# Patient Record
Sex: Male | Born: 1950 | Race: White | Hispanic: No | Marital: Married | Smoking: Never smoker
Health system: Southern US, Community
[De-identification: ages and names within clinical notes are randomized; demographics above are authoritative.]

## PROBLEM LIST (undated history)

## (undated) DIAGNOSIS — R112 Nausea with vomiting, unspecified: Secondary | ICD-10-CM

## (undated) DIAGNOSIS — Z8719 Personal history of other diseases of the digestive system: Secondary | ICD-10-CM

## (undated) DIAGNOSIS — C801 Malignant (primary) neoplasm, unspecified: Secondary | ICD-10-CM

## (undated) DIAGNOSIS — N200 Calculus of kidney: Secondary | ICD-10-CM

## (undated) DIAGNOSIS — N183 Chronic kidney disease, stage 3 unspecified: Secondary | ICD-10-CM

## (undated) DIAGNOSIS — E785 Hyperlipidemia, unspecified: Secondary | ICD-10-CM

## (undated) DIAGNOSIS — K219 Gastro-esophageal reflux disease without esophagitis: Secondary | ICD-10-CM

## (undated) DIAGNOSIS — I10 Essential (primary) hypertension: Secondary | ICD-10-CM

## (undated) DIAGNOSIS — Z9889 Other specified postprocedural states: Secondary | ICD-10-CM

## (undated) DIAGNOSIS — I8290 Acute embolism and thrombosis of unspecified vein: Secondary | ICD-10-CM

## (undated) DIAGNOSIS — K5730 Diverticulosis of large intestine without perforation or abscess without bleeding: Secondary | ICD-10-CM

## (undated) DIAGNOSIS — K635 Polyp of colon: Secondary | ICD-10-CM

## (undated) DIAGNOSIS — G510 Bell's palsy: Secondary | ICD-10-CM

## (undated) DIAGNOSIS — K649 Unspecified hemorrhoids: Secondary | ICD-10-CM

## (undated) DIAGNOSIS — H269 Unspecified cataract: Secondary | ICD-10-CM

## (undated) DIAGNOSIS — Z5189 Encounter for other specified aftercare: Secondary | ICD-10-CM

## (undated) DIAGNOSIS — R7303 Prediabetes: Secondary | ICD-10-CM

## (undated) DIAGNOSIS — D649 Anemia, unspecified: Secondary | ICD-10-CM

## (undated) HISTORY — DX: Anemia, unspecified: D64.9

## (undated) HISTORY — DX: Calculus of kidney: N20.0

## (undated) HISTORY — PX: POLYPECTOMY: SHX149

## (undated) HISTORY — DX: Diverticulosis of large intestine without perforation or abscess without bleeding: K57.30

## (undated) HISTORY — DX: Hyperlipidemia, unspecified: E78.5

## (undated) HISTORY — DX: Polyp of colon: K63.5

## (undated) HISTORY — DX: Prediabetes: R73.03

## (undated) HISTORY — DX: Bell's palsy: G51.0

## (undated) HISTORY — DX: Encounter for other specified aftercare: Z51.89

## (undated) HISTORY — PX: COLONOSCOPY: SHX174

## (undated) HISTORY — DX: Unspecified cataract: H26.9

---

## 1975-07-03 DIAGNOSIS — G510 Bell's palsy: Secondary | ICD-10-CM

## 1975-07-03 HISTORY — DX: Bell's palsy: G51.0

## 1983-07-03 HISTORY — PX: APPENDECTOMY: SHX54

## 1999-01-12 ENCOUNTER — Encounter (INDEPENDENT_AMBULATORY_CARE_PROVIDER_SITE_OTHER): Payer: Self-pay | Admitting: Specialist

## 1999-01-12 ENCOUNTER — Other Ambulatory Visit: Admission: RE | Admit: 1999-01-12 | Discharge: 1999-01-12 | Payer: Self-pay | Admitting: Gastroenterology

## 1999-07-03 HISTORY — PX: ESOPHAGEAL DILATION: SHX303

## 1999-07-13 ENCOUNTER — Encounter (INDEPENDENT_AMBULATORY_CARE_PROVIDER_SITE_OTHER): Payer: Self-pay | Admitting: Specialist

## 1999-07-13 ENCOUNTER — Ambulatory Visit (HOSPITAL_COMMUNITY): Admission: RE | Admit: 1999-07-13 | Discharge: 1999-07-13 | Payer: Self-pay | Admitting: Gastroenterology

## 2003-07-03 DIAGNOSIS — I8290 Acute embolism and thrombosis of unspecified vein: Secondary | ICD-10-CM

## 2003-07-03 HISTORY — DX: Acute embolism and thrombosis of unspecified vein: I82.90

## 2004-05-10 ENCOUNTER — Ambulatory Visit: Payer: Self-pay | Admitting: Internal Medicine

## 2004-05-15 ENCOUNTER — Ambulatory Visit: Payer: Self-pay | Admitting: Internal Medicine

## 2004-05-16 ENCOUNTER — Encounter: Admission: RE | Admit: 2004-05-16 | Discharge: 2004-05-16 | Payer: Self-pay | Admitting: Internal Medicine

## 2004-06-19 ENCOUNTER — Ambulatory Visit: Payer: Self-pay | Admitting: Gastroenterology

## 2004-09-11 ENCOUNTER — Ambulatory Visit: Payer: Self-pay | Admitting: Internal Medicine

## 2005-03-20 ENCOUNTER — Ambulatory Visit: Payer: Self-pay | Admitting: Internal Medicine

## 2005-04-03 ENCOUNTER — Ambulatory Visit: Payer: Self-pay | Admitting: Internal Medicine

## 2005-08-08 ENCOUNTER — Ambulatory Visit: Payer: Self-pay | Admitting: Gastroenterology

## 2005-09-03 ENCOUNTER — Ambulatory Visit: Payer: Self-pay | Admitting: Internal Medicine

## 2006-02-14 ENCOUNTER — Ambulatory Visit: Payer: Self-pay | Admitting: Gastroenterology

## 2006-03-07 ENCOUNTER — Encounter (INDEPENDENT_AMBULATORY_CARE_PROVIDER_SITE_OTHER): Payer: Self-pay | Admitting: Gastroenterology

## 2006-03-07 ENCOUNTER — Ambulatory Visit: Payer: Self-pay | Admitting: Gastroenterology

## 2006-07-24 ENCOUNTER — Ambulatory Visit: Payer: Self-pay | Admitting: Gastroenterology

## 2006-09-04 ENCOUNTER — Ambulatory Visit: Payer: Self-pay | Admitting: Internal Medicine

## 2006-09-04 LAB — CONVERTED CEMR LAB
ALT: 28 units/L (ref 0–40)
AST: 12 units/L (ref 0–37)
BUN: 12 mg/dL (ref 6–23)
Cholesterol: 205 mg/dL (ref 0–200)
Creatinine, Ser: 1.1 mg/dL (ref 0.4–1.5)
Creatinine,U: 160.4 mg/dL
Direct LDL: 126.2 mg/dL
HDL: 41.2 mg/dL (ref >39.0)
Hgb A1c MFr Bld: 5.8 % (ref 4.6–6.0)
Microalb Creat Ratio: 6.9 mg/g (ref 0.0–30.0)
Microalb, Ur: 1.1 mg/dL (ref 0.0–1.9)
PSA: 1.67 ng/mL (ref 0.10–4.00)
Potassium: 3.9 meq/L (ref 3.5–5.1)
Total CHOL/HDL Ratio: 5
Triglycerides: 178 mg/dL — ABNORMAL HIGH (ref 0–149)
VLDL: 36 mg/dL (ref 0–40)

## 2006-12-06 DIAGNOSIS — G510 Bell's palsy: Secondary | ICD-10-CM

## 2006-12-06 DIAGNOSIS — K219 Gastro-esophageal reflux disease without esophagitis: Secondary | ICD-10-CM

## 2006-12-06 DIAGNOSIS — Z860100 Personal history of colon polyps, unspecified: Secondary | ICD-10-CM | POA: Insufficient documentation

## 2006-12-06 DIAGNOSIS — G43909 Migraine, unspecified, not intractable, without status migrainosus: Secondary | ICD-10-CM | POA: Insufficient documentation

## 2006-12-06 DIAGNOSIS — K5730 Diverticulosis of large intestine without perforation or abscess without bleeding: Secondary | ICD-10-CM | POA: Insufficient documentation

## 2006-12-19 ENCOUNTER — Ambulatory Visit: Payer: Self-pay | Admitting: Internal Medicine

## 2006-12-19 DIAGNOSIS — T7841XA Arthus phenomenon, initial encounter: Secondary | ICD-10-CM | POA: Insufficient documentation

## 2006-12-31 ENCOUNTER — Encounter (INDEPENDENT_AMBULATORY_CARE_PROVIDER_SITE_OTHER): Payer: Self-pay | Admitting: *Deleted

## 2006-12-31 LAB — CONVERTED CEMR LAB
ALT: 32 units/L (ref 0–40)
AST: 14 units/L (ref 0–37)
Cholesterol: 175 mg/dL (ref 0–200)
HDL: 39.5 mg/dL (ref >39.0)
LDL Cholesterol: 103 mg/dL — ABNORMAL HIGH (ref 0–99)
Total CHOL/HDL Ratio: 4.4
Triglycerides: 163 mg/dL — ABNORMAL HIGH (ref 0–149)
VLDL: 33 mg/dL (ref 0–40)

## 2007-03-20 ENCOUNTER — Telehealth (INDEPENDENT_AMBULATORY_CARE_PROVIDER_SITE_OTHER): Payer: Self-pay | Admitting: *Deleted

## 2007-09-15 ENCOUNTER — Ambulatory Visit: Payer: Self-pay | Admitting: Internal Medicine

## 2007-09-15 DIAGNOSIS — I10 Essential (primary) hypertension: Secondary | ICD-10-CM

## 2007-09-15 DIAGNOSIS — E785 Hyperlipidemia, unspecified: Secondary | ICD-10-CM

## 2007-09-22 ENCOUNTER — Encounter (INDEPENDENT_AMBULATORY_CARE_PROVIDER_SITE_OTHER): Payer: Self-pay | Admitting: *Deleted

## 2007-09-30 ENCOUNTER — Ambulatory Visit: Payer: Self-pay | Admitting: Gastroenterology

## 2008-02-10 ENCOUNTER — Ambulatory Visit: Payer: Self-pay | Admitting: Internal Medicine

## 2008-02-16 ENCOUNTER — Encounter (INDEPENDENT_AMBULATORY_CARE_PROVIDER_SITE_OTHER): Payer: Self-pay | Admitting: *Deleted

## 2008-05-19 ENCOUNTER — Telehealth (INDEPENDENT_AMBULATORY_CARE_PROVIDER_SITE_OTHER): Payer: Self-pay | Admitting: *Deleted

## 2008-05-19 ENCOUNTER — Telehealth: Payer: Self-pay | Admitting: Gastroenterology

## 2008-05-20 ENCOUNTER — Encounter: Payer: Self-pay | Admitting: Internal Medicine

## 2008-07-09 ENCOUNTER — Ambulatory Visit: Payer: Self-pay | Admitting: Internal Medicine

## 2008-08-25 ENCOUNTER — Telehealth (INDEPENDENT_AMBULATORY_CARE_PROVIDER_SITE_OTHER): Payer: Self-pay | Admitting: *Deleted

## 2008-10-05 ENCOUNTER — Ambulatory Visit: Payer: Self-pay | Admitting: Internal Medicine

## 2008-10-05 LAB — CONVERTED CEMR LAB
ALT: 25 units/L (ref 0–53)
AST: 14 units/L (ref 0–37)
Albumin: 4.1 g/dL (ref 3.5–5.2)
Alkaline Phosphatase: 64 units/L (ref 39–117)
BUN: 14 mg/dL (ref 6–23)
Basophils Absolute: 0 10*3/uL (ref 0.0–0.1)
Basophils Relative: 0.7 % (ref 0.0–3.0)
Bilirubin, Direct: 0.1 mg/dL (ref 0.0–0.3)
CO2: 31 meq/L (ref 19–32)
Calcium: 9.1 mg/dL (ref 8.4–10.5)
Chloride: 105 meq/L (ref 96–112)
Cholesterol: 171 mg/dL (ref 0–200)
Creatinine, Ser: 1.1 mg/dL (ref 0.4–1.5)
Eosinophils Absolute: 0.2 10*3/uL (ref 0.0–0.7)
Eosinophils Relative: 3.3 % (ref 0.0–5.0)
GFR calc non Af Amer: 72.54 mL/min (ref >60)
Glucose, Bld: 114 mg/dL — ABNORMAL HIGH (ref 70–99)
HCT: 43.6 % (ref 39.0–52.0)
HDL: 38.3 mg/dL — ABNORMAL LOW (ref >39.00)
Hemoglobin: 14.9 g/dL (ref 13.0–17.0)
LDL Cholesterol: 109 mg/dL — ABNORMAL HIGH (ref 0–99)
Lymphocytes Relative: 30.6 % (ref 12.0–46.0)
Lymphs Abs: 1.4 10*3/uL (ref 0.7–4.0)
MCHC: 34.1 g/dL (ref 30.0–36.0)
MCV: 89.4 fL (ref 78.0–100.0)
Monocytes Absolute: 0.4 10*3/uL (ref 0.1–1.0)
Monocytes Relative: 9.5 % (ref 3.0–12.0)
Neutro Abs: 2.6 10*3/uL (ref 1.4–7.7)
Neutrophils Relative %: 55.9 % (ref 43.0–77.0)
PSA: 2.16 ng/mL (ref 0.10–4.00)
Platelets: 159 10*3/uL (ref 150.0–400.0)
Potassium: 3.8 meq/L (ref 3.5–5.1)
RBC: 4.88 M/uL (ref 4.22–5.81)
RDW: 13.2 % (ref 11.5–14.6)
Sodium: 145 meq/L (ref 135–145)
TSH: 2.59 microintl units/mL (ref 0.35–5.50)
Total Bilirubin: 1.1 mg/dL (ref 0.3–1.2)
Total CHOL/HDL Ratio: 4
Total Protein: 6.2 g/dL (ref 6.0–8.3)
Triglycerides: 117 mg/dL (ref 0.0–149.0)
VLDL: 23.4 mg/dL (ref 0.0–40.0)
WBC: 4.6 10*3/uL (ref 4.5–10.5)

## 2008-10-14 ENCOUNTER — Ambulatory Visit: Payer: Self-pay | Admitting: Internal Medicine

## 2008-10-14 DIAGNOSIS — R7303 Prediabetes: Secondary | ICD-10-CM | POA: Insufficient documentation

## 2008-10-14 LAB — CONVERTED CEMR LAB
Cholesterol, target level: 200 mg/dL
HDL goal, serum: 40 mg/dL
LDL Goal: 100 mg/dL

## 2008-10-17 LAB — CONVERTED CEMR LAB
Creatinine,U: 71.4 mg/dL
Hgb A1c MFr Bld: 5.8 % (ref 4.6–6.5)
Microalb Creat Ratio: 5.6 mg/g (ref 0.0–30.0)
Microalb, Ur: 0.4 mg/dL (ref 0.0–1.9)

## 2008-10-18 ENCOUNTER — Encounter (INDEPENDENT_AMBULATORY_CARE_PROVIDER_SITE_OTHER): Payer: Self-pay | Admitting: *Deleted

## 2009-03-31 ENCOUNTER — Ambulatory Visit: Payer: Self-pay | Admitting: Internal Medicine

## 2009-04-03 LAB — CONVERTED CEMR LAB
ALT: 23 units/L (ref 0–53)
AST: 14 units/L (ref 0–37)
Albumin: 4 g/dL (ref 3.5–5.2)
Alkaline Phosphatase: 65 units/L (ref 39–117)
Bilirubin, Direct: 0 mg/dL (ref 0.0–0.3)
Cholesterol: 153 mg/dL (ref 0–200)
HDL: 39.1 mg/dL (ref >39.00)
LDL Cholesterol: 102 mg/dL — ABNORMAL HIGH (ref 0–99)
Total Bilirubin: 0.6 mg/dL (ref 0.3–1.2)
Total CHOL/HDL Ratio: 4
Total Protein: 6.1 g/dL (ref 6.0–8.3)
Triglycerides: 61 mg/dL (ref 0.0–149.0)
VLDL: 12.2 mg/dL (ref 0.0–40.0)

## 2009-04-04 ENCOUNTER — Encounter (INDEPENDENT_AMBULATORY_CARE_PROVIDER_SITE_OTHER): Payer: Self-pay | Admitting: *Deleted

## 2009-04-07 ENCOUNTER — Ambulatory Visit: Payer: Self-pay | Admitting: Internal Medicine

## 2009-04-29 ENCOUNTER — Telehealth (INDEPENDENT_AMBULATORY_CARE_PROVIDER_SITE_OTHER): Payer: Self-pay | Admitting: *Deleted

## 2009-11-29 ENCOUNTER — Ambulatory Visit: Payer: Self-pay | Admitting: Internal Medicine

## 2009-11-29 DIAGNOSIS — M2550 Pain in unspecified joint: Secondary | ICD-10-CM | POA: Insufficient documentation

## 2010-03-31 ENCOUNTER — Telehealth (INDEPENDENT_AMBULATORY_CARE_PROVIDER_SITE_OTHER): Payer: Self-pay | Admitting: *Deleted

## 2010-03-31 ENCOUNTER — Ambulatory Visit: Payer: Self-pay | Admitting: Internal Medicine

## 2010-04-03 LAB — CONVERTED CEMR LAB
ALT: 25 units/L (ref 0–53)
AST: 15 units/L (ref 0–37)
Albumin: 4 g/dL (ref 3.5–5.2)
Alkaline Phosphatase: 68 units/L (ref 39–117)
BUN: 18 mg/dL (ref 6–23)
Basophils Absolute: 0 10*3/uL (ref 0.0–0.1)
Basophils Relative: 0.8 % (ref 0.0–3.0)
Bilirubin, Direct: 0.2 mg/dL (ref 0.0–0.3)
CO2: 31 meq/L (ref 19–32)
Calcium: 8.7 mg/dL (ref 8.4–10.5)
Chloride: 104 meq/L (ref 96–112)
Cholesterol: 176 mg/dL (ref 0–200)
Creatinine, Ser: 1.2 mg/dL (ref 0.4–1.5)
Eosinophils Absolute: 0.2 10*3/uL (ref 0.0–0.7)
Eosinophils Relative: 5.2 % — ABNORMAL HIGH (ref 0.0–5.0)
GFR calc non Af Amer: 66.56 mL/min (ref >60)
Glucose, Bld: 112 mg/dL — ABNORMAL HIGH (ref 70–99)
HCT: 41.6 % (ref 39.0–52.0)
HDL: 41.5 mg/dL (ref >39.00)
Hemoglobin: 14.4 g/dL (ref 13.0–17.0)
LDL Cholesterol: 119 mg/dL — ABNORMAL HIGH (ref 0–99)
Lymphocytes Relative: 32.8 % (ref 12.0–46.0)
Lymphs Abs: 1.5 10*3/uL (ref 0.7–4.0)
MCHC: 34.5 g/dL (ref 30.0–36.0)
MCV: 91.1 fL (ref 78.0–100.0)
Monocytes Absolute: 0.5 10*3/uL (ref 0.1–1.0)
Monocytes Relative: 9.8 % (ref 3.0–12.0)
Neutro Abs: 2.4 10*3/uL (ref 1.4–7.7)
Neutrophils Relative %: 51.4 % (ref 43.0–77.0)
PSA: 2.63 ng/mL (ref 0.10–4.00)
Platelets: 161 10*3/uL (ref 150.0–400.0)
Potassium: 2.6 meq/L — CL (ref 3.5–5.1)
RBC: 4.57 M/uL (ref 4.22–5.81)
RDW: 13.8 % (ref 11.5–14.6)
Sodium: 142 meq/L (ref 135–145)
TSH: 1.82 microintl units/mL (ref 0.35–5.50)
Total Bilirubin: 1.1 mg/dL (ref 0.3–1.2)
Total CHOL/HDL Ratio: 4
Total Protein: 6 g/dL (ref 6.0–8.3)
Triglycerides: 80 mg/dL (ref 0.0–149.0)
VLDL: 16 mg/dL (ref 0.0–40.0)
WBC: 4.6 10*3/uL (ref 4.5–10.5)

## 2010-04-04 ENCOUNTER — Telehealth (INDEPENDENT_AMBULATORY_CARE_PROVIDER_SITE_OTHER): Payer: Self-pay | Admitting: *Deleted

## 2010-04-04 LAB — CONVERTED CEMR LAB: Hgb A1c MFr Bld: 5.7 % (ref 4.6–6.5)

## 2010-04-05 ENCOUNTER — Ambulatory Visit: Payer: Self-pay | Admitting: Internal Medicine

## 2010-04-05 DIAGNOSIS — E876 Hypokalemia: Secondary | ICD-10-CM | POA: Insufficient documentation

## 2010-04-06 LAB — CONVERTED CEMR LAB
BUN: 16 mg/dL (ref 6–23)
CO2: 33 meq/L — ABNORMAL HIGH (ref 19–32)
Calcium: 9.1 mg/dL (ref 8.4–10.5)
Chloride: 104 meq/L (ref 96–112)
Creatinine, Ser: 1.1 mg/dL (ref 0.4–1.5)
GFR calc non Af Amer: 71.43 mL/min (ref >60)
Glucose, Bld: 102 mg/dL — ABNORMAL HIGH (ref 70–99)
Potassium: 3.2 meq/L — ABNORMAL LOW (ref 3.5–5.1)
Sodium: 145 meq/L (ref 135–145)

## 2010-04-18 ENCOUNTER — Ambulatory Visit: Payer: Self-pay | Admitting: Internal Medicine

## 2010-05-16 ENCOUNTER — Ambulatory Visit: Payer: Self-pay | Admitting: Internal Medicine

## 2010-05-16 ENCOUNTER — Telehealth (INDEPENDENT_AMBULATORY_CARE_PROVIDER_SITE_OTHER): Payer: Self-pay | Admitting: *Deleted

## 2010-05-22 LAB — CONVERTED CEMR LAB
BUN: 23 mg/dL (ref 6–23)
Cortisol, Plasma: 10.8 ug/dL
Creatinine, Ser: 1.4 mg/dL (ref 0.4–1.5)
Potassium: 4.6 meq/L (ref 3.5–5.1)

## 2010-07-30 LAB — CONVERTED CEMR LAB
ALT: 40 units/L (ref 0–53)
AST: 13 units/L (ref 0–37)
Albumin: 4.1 g/dL (ref 3.5–5.2)
Alkaline Phosphatase: 84 units/L (ref 39–117)
BUN: 14 mg/dL (ref 6–23)
Basophils Absolute: 0 10*3/uL (ref 0.0–0.1)
Basophils Relative: 0.7 % (ref 0.0–1.0)
Bilirubin, Direct: 0.1 mg/dL (ref 0.0–0.3)
CO2: 35 meq/L — ABNORMAL HIGH (ref 19–32)
Calcium: 9.3 mg/dL (ref 8.4–10.5)
Chloride: 105 meq/L (ref 96–112)
Cholesterol, target level: 200 mg/dL
Cholesterol: 214 mg/dL (ref 0–200)
Creatinine, Ser: 1.1 mg/dL (ref 0.4–1.5)
Direct LDL: 125.3 mg/dL
Eosinophils Absolute: 0.2 10*3/uL (ref 0.0–0.6)
Eosinophils Relative: 3.7 % (ref 0.0–5.0)
GFR calc Af Amer: 88 mL/min
GFR calc non Af Amer: 73 mL/min
Glucose, Bld: 106 mg/dL — ABNORMAL HIGH (ref 70–99)
HCT: 46.9 % (ref 39.0–52.0)
HDL goal, serum: 40 mg/dL
HDL: 38.7 mg/dL — ABNORMAL LOW (ref >39.0)
Hemoglobin: 15.6 g/dL (ref 13.0–17.0)
Hgb A1c MFr Bld: 5.9 % (ref 4.6–6.0)
Hgb A1c MFr Bld: 6 % (ref 4.6–6.0)
LDL Goal: 130 mg/dL
Lymphocytes Relative: 28.5 % (ref 12.0–46.0)
MCHC: 33.2 g/dL (ref 30.0–36.0)
MCV: 89.2 fL (ref 78.0–100.0)
Monocytes Absolute: 0.6 10*3/uL (ref 0.2–0.7)
Monocytes Relative: 9.8 % (ref 3.0–11.0)
Neutro Abs: 3.5 10*3/uL (ref 1.4–7.7)
Neutrophils Relative %: 57.3 % (ref 43.0–77.0)
PSA: 1.99 ng/mL (ref 0.10–4.00)
Platelets: 189 10*3/uL (ref 150–400)
Potassium: 3.8 meq/L (ref 3.5–5.1)
RBC: 5.26 M/uL (ref 4.22–5.81)
RDW: 12.7 % (ref 11.5–14.6)
Sodium: 145 meq/L (ref 135–145)
TSH: 2.1 microintl units/mL (ref 0.35–5.50)
Total Bilirubin: 0.8 mg/dL (ref 0.3–1.2)
Total CHOL/HDL Ratio: 5.5
Total Protein: 6.5 g/dL (ref 6.0–8.3)
Triglycerides: 236 mg/dL (ref 0–149)
VLDL: 47 mg/dL — ABNORMAL HIGH (ref 0–40)
WBC: 6 10*3/uL (ref 4.5–10.5)

## 2010-08-03 NOTE — Progress Notes (Signed)
Summary: RX  Phone Note Refill Request Call back at Home Phone [phone] Message from:  Patient on May 16, 2010 7:48 AM  Refills Requested: Medication #1:  ACIPHEX 20 MG TBEC 1 by mouth once daily   Dosage confirmed as above?Dosage Confirmed   Supply Requested: 1 month  Medication #2:  BENAZEPRIL HCL 40 MG  TABS (BENAZEPRIL HCL) 1 qd   Dosage confirmed as above?Dosage Confirmed   Supply Requested: 3 months  Medication #3:  CARDIZEM LA 240 MG  TB24 1 once daily   Dosage confirmed as above?Dosage Confirmed   Supply Requested: 3 months [facility] [id number]  Initial call taken by: Freddy Jaksch,  May 16, 2010 7:48 AM    Prescriptions: BENAZEPRIL HCL 40 MG  TABS (BENAZEPRIL HCL) 1 qd  #90 x 1   Entered by:   Shonna Chock CMA   Authorized by:   Marga Melnick MD   Signed by:   Shonna Chock CMA on 05/16/2010   Method used:   Faxed to ...       [facility]  [address] # [id number]* (retail)       [address]       [place], [place]  [address]       Ph: [phone] or [phone]       Fax: [phone]   RxID:   [id number] CARDIZEM LA 240 MG  TB24 (DILTIAZEM HCL COATED BEADS) 1 once daily  #90 x 1   Entered by:   Shonna Chock CMA   Authorized by:   Marga Melnick MD   Signed by:   Shonna Chock CMA on 05/16/2010   Method used:   Electronically to        [facility] # [id number]* (retail)       [address]       [place], [place]  [address]       Ph: [phone] or [phone]       Fax: [phone]   RxID:   [id number] ACIPHEX 20 MG TBEC (RABEPRAZOLE SODIUM) 1 by mouth once daily  #90 x 1   Entered by:   Shonna Chock CMA   Authorized by:   Marga Melnick MD   Signed by:   Shonna Chock CMA on 05/16/2010   Method used:   Electronically to        [facility] # [id number]* (retail)       [address]       [place], [place]  [address]       Ph: [phone] or [phone]  Fax: [phone]   RxID:   [id number]

## 2010-08-03 NOTE — Assessment & Plan Note (Signed)
Summary: rx for aciphex//lch   Vital Signs:  Patient profile:   60 year old male Weight:      223 pounds Temp:     98.2 degrees F oral Pulse rate:   64 / minute BP sitting:   138 / 86  (left arm)  Vitals Entered By: Jeremy Johann CMA (Nov 29, 2009 2:29 PM) CC: refill med,labs Comments REVIEWED MED LIST, PATIENT AGREED DOSE AND INSTRUCTION CORRECT    CC:  refill med and labs.  History of Present Illness: He needs refill for Aciphex; PMH of Esophgeal Stricture, S/P dilation. No dyshagia  or other GI symptoms. No triggers for ERD except rare beer & 2-3 cups of coffee/ day & 3 glasses of tea.  Allergies: 1)  ! Sulfa 2)  ! Pcn  Review of Systems General:  Denies fatigue and weight loss. ENT:  Denies difficulty swallowing and hoarseness. GI:  Denies abdominal pain, bloody stools, dark tarry stools, indigestion, loss of appetite, and nausea. MS:  Complains of joint pain and joint swelling; denies joint redness; Occasional knee pain , R > L.  Physical Exam  General:  well-nourished; alert,appropriate and cooperative throughout examination Eyes:  No corneal or conjunctival inflammation noted.  Perrla.No icterus Mouth:  Oral mucosa and oropharynx without lesions or exudates.  Teeth in good repair. No pharyngeal erythema.   Lungs:  Normal respiratory effort, chest expands symmetrically. Lungs are clear to auscultation, no crackles or wheezes. Heart:  regular rhythm, no murmur, no gallop, no rub, no JVD, no HJR, and bradycardia.   Abdomen:  Bowel sounds positive,abdomen soft and non-tender without masses, organomegaly or hernias noted. Pulses:  R and L carotid,radial,dorsalis pedis and posterior tibial pulses are full and equal bilaterally Extremities:  No clubbing, cyanosis, edema, or deformity noted with normal full range of motion of all joints.  Mild crepitus L knee > R  Neurologic:  alert & oriented X3.   Skin:  Intact without suspicious lesions or rashes. No  jaundice   Impression & Recommendations:  Problem # 1:  GERD (ICD-530.81) PMH of esophageal stricture His updated medication list for this problem includes:    Aciphex 20 Mg Tbec (Rabeprazole sodium) .Marland Kitchen... 1 by mouth once daily  Problem # 2:  ARTHRALGIA (ICD-719.40) Knees  Complete Medication List: 1)  Aciphex 20 Mg Tbec (Rabeprazole sodium) .Marland Kitchen.. 1 by mouth once daily 2)  Cardizem La 240 Mg Tb24 (Diltiazem hcl coated beads) .Marland Kitchen.. 1 once daily 3)  Benazepril Hcl 40 Mg Tabs (benazepril Hcl)  .Marland Kitchen.. 1 qd 4)  Multivitamins Tabs (Multiple vitamin) .Marland Kitchen.. 1 by mouth once daily 5)  Fish Oil Concentrate 1000 Mg Caps (Omega-3 fatty acids) .Marland Kitchen.. 1 by mouth two times a day 6)  Tramadol Hcl 50 Mg Tabs (Tramadol hcl) .Marland Kitchen.. 1 every 6 hrs as needed knee pain  Patient Instructions: 1)  Please schedule a follow-up appointment in 5  months. 2)  Avoid foods high in acid (tomatoes, citrus juices, spicy foods). Avoid eating within two hours of lying down or before exercising. Do not over eat; try smaller more frequent meals. Elevate head of bed twelve inches when sleeping. 3)  BMP prior to visit, ICD-9:401.9 4)  Hepatic Panel prior to visit, ICD-9:995.20 5)  Lipid Panel prior to visit, ICD-9:272.4 6)  TSH prior to visit, ICD-9:272.4 7)  CBC w/ Diff prior to visit, ICD-9:530.81 8)  PSA prior to visit, ICD-9:600.9 Prescriptions: TRAMADOL HCL 50 MG TABS (TRAMADOL HCL) 1 every 6 hrs as needed knee pain  #  30 x 5   Entered and Authorized by:   Marga Melnick MD   Signed by:   Marga Melnick MD on 11/29/2009   Method used:   Print then Give to Patient   RxID:   [id number] ACIPHEX 20 MG TBEC (RABEPRAZOLE SODIUM) 1 by mouth once daily  #90 x 1   Entered and Authorized by:   Marga Melnick MD   Signed by:   Marga Melnick MD on 11/29/2009   Method used:   Print then Give to Patient   RxID:   [id number]

## 2010-08-03 NOTE — Progress Notes (Signed)
Summary: lab order from ov [id number] - ov [id number]  ---- Converted from flag ---- ---- 04/04/2010 10:04 AM, Okey Regal [place] wrote: labs order is follow up  from [facility] [id number] - he has an appt scheduled [id number]  ---- 04/03/2010 7:43 AM, Marga Melnick MD wrote: please verify F/U appt; these are CPX labs ------------------------------

## 2010-08-03 NOTE — Assessment & Plan Note (Signed)
Summary: RESCHED FROM 10/6//PH   Vital Signs:  Patient profile:   60 year old male Weight:      223.2 pounds Pulse rate:   56 / minute Resp:     16 per minute BP sitting:   158 / 92  (left arm) Cuff size:   large  Vitals Entered By: Shonna Chock CMA (April 18, 2010 9:05 AM) CC: Follow-up visit: discuss labs (copy given)   CC:  Follow-up visit: discuss labs (copy given).  History of Present Illness:   Hypokalemia ( 2.6 ) on 03/31/2010; it was 3.2 after 10 meQ two times a day. He he denies use of laxatives, steroids  or diuretics. No recent gastroenteritis with N&V or diarrhea..  The patient reports headaches, but denies lightheadedness, urinary frequency, and fatigue.  The patient denies the following associated symptoms: chest pain, chest pressure, exercise intolerance, dyspnea, palpitations, syncope, leg edema, and pedal edema.  Compliance with medications (by patient report) has been near 100%.  The patient reports that dietary compliance has been good.  The patient reports exercising 3-4X per week.  Adjunctive measures currently used by the patient include salt restriction.  BP @ home 140-150s/ 80-90s.  Current Medications (verified): 1)  Aciphex 20 Mg Tbec (Rabeprazole Sodium) .Marland Kitchen.. 1 By Mouth Once Daily 2)  Cardizem La 240 Mg  Tb24 (Diltiazem Hcl Coated Beads) .Marland Kitchen.. 1 Once Daily 3)  Benazepril Hcl 40 Mg  Tabs (Benazepril Hcl) .Marland Kitchen.. 1 Qd 4)  Multivitamins  Tabs (Multiple Vitamin) .Marland Kitchen.. 1 By Mouth Once Daily 5)  Fish Oil Concentrate 1000 Mg Caps (Omega-3 Fatty Acids) .Marland Kitchen.. 1 By Mouth Two Times A Day 6)  Kcl .... 1 By Mouth Two Times A Day  Allergies: 1)  ! Sulfa 2)  ! 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 Neuro:  Denies brief paralysis, numbness, tingling, and weakness; "aggravating " L frontal headache intermittently , relieved by Excedrin.  Physical Exam  General:  well-nourished,in no acute distress; alert,appropriate and cooperative throughout examination Eyes:  No corneal or conjunctival inflammation noted. EOMI. Perrla. Lungs:  Normal respiratory effort, chest expands symmetrically. Lungs are clear to auscultation, no crackles or wheezes. Heart:  Normal rate and regular rhythm. S1 and S2 normal without gallop, murmur, click, rub or other extra sounds. Abdomen:  Bowel sounds positive,abdomen soft and non-tender without masses, organomegaly or hernias noted. No AAA or renal bruits Pulses:  R and L carotid,radial,dorsalis pedis and posterior tibial pulses are full and equal bilaterally Neurologic:   Very subtle facial asymmetry;alert & oriented X3 ; DTRs symmetrical and normal.   Skin:  Intact without suspicious lesions or rashes Cervical Nodes:  No lymphadenopathy noted Axillary Nodes:  No palpable lymphadenopathy Psych:  memory intact for recent and remote, normally interactive, and good eye contact.     Impression & Recommendations:  Problem # 1:  HYPOKALEMIA (ICD-276.8)  Problem # 2:  HYPERTENSION, ESSENTIAL NOS (ICD-401.9)  His updated medication list for this problem includes:    Cardizem La 240 Mg Tb24 (Diltiazem hcl coated beads) .Marland Kitchen... 1 once daily    Spironolactone 25 Mg Tabs (Spironolactone) .Marland Kitchen... 1 once daily  Complete Medication List: 1)  Aciphex 20 Mg Tbec (Rabeprazole sodium) .Marland Kitchen.. 1 by mouth once daily 2)  Cardizem La 240 Mg Tb24 (Diltiazem hcl coated beads) .Marland Kitchen.. 1 once daily 3)  Benazepril Hcl 40 Mg Tabs (benazepril Hcl)  .Marland Kitchen.. 1 qd 4)  Multivitamins Tabs (Multiple vitamin) .Marland Kitchen.. 1 by mouth once daily 5)  Fish Oil Concentrate 1000 Mg Caps  (Omega-3 fatty acids) .Marland Kitchen.. 1 by mouth two times a day 6)  Kcl  .... 1 by mouth two times a day 7)  Spironolactone 25 Mg Tabs (Spironolactone) .Marland Kitchen.. 1 once daily  Patient Instructions: 1)  Please schedule a follow-up LAB  appointment in 1 month for fasting Cortisol, BUN, creat, K+ ( 401.9,276.8). 2)  Check your Blood Pressure regularly. Your goal = AVERAGE < 135/85. Use "No Salt" @ the table to season food. Prescriptions: SPIRONOLACTONE 25 MG TABS (SPIRONOLACTONE) 1 once daily  #30 x 5   Entered and Authorized by:   Marga Melnick MD   Signed by:   Marga Melnick MD on 04/18/2010   Method used:   Print then Give to Patient   RxID:   [id number]    Orders Added: 1)  Est. Patient Level IV [[address]846]

## 2010-08-03 NOTE — Progress Notes (Signed)
Summary: Lab concenrs  Phone Note Outgoing Call Call back at Home Phone [phone]   Call placed by: Shonna Chock CMA,  March 31, 2010 4:44 PM Call placed to: Patient Summary of Call: Left message on machine for patient to return call when avaliable, Reason for call:   Potassium low on recent labs 2.6 , per Dr.Hopper have patient take KCL 1 by mouth two times a day and recheck labs in 4 days (next Tuesday), also no diuretics or laxatives.  Shonna Chock CMA  March 31, 2010 4:46 PM   Follow-up for Phone Call        I called patient on work number and discussed with him his potassium was  low and gave Dr.Hopper's instruction. Patient ok'd all instruction and requested rx to be called into [facility]. Patient indicated he will call Monday to set up appointment to recheck potassium level (276.8 Potassium)for 4 days after starting prescribed supplement    Follow-up by: Shonna Chock CMA,  March 31, 2010 4:51 PM    New/Updated Medications: * KCL 1 by mouth two times a day Prescriptions: KCL 1 by mouth two times a day  ##20 x 0   Entered by:   Shonna Chock CMA   Authorized by:   Marga Melnick MD   Signed by:   Shonna Chock CMA on 03/31/2010   Method used:   Telephoned to ...       [facility]  [address] # [id number]* (retail)       [address]       [place], [place]  [address]       Ph: [phone] or [phone]       Fax: [phone]   RxID:   [id number]

## 2010-10-25 ENCOUNTER — Other Ambulatory Visit: Payer: Self-pay | Admitting: Dermatology

## 2010-11-14 NOTE — Assessment & Plan Note (Signed)
[facility]                         GASTROENTEROLOGY OFFICE NOTE   Crawford, Marcus                       MRN:          [id number]  DATE:09/30/2007                            DOB:          May 15, 1951    This is a return office visit for GERD.  Marcus Crawford was previously  followed by Dr. Victorino Dike.  He has a history of GERD that has been  well controlled on Aciphex.  He has no dysphagia, odynophagia, nausea,  vomiting, or weight loss.  He has a history of adenomatous colon polyps,  and last underwent colonoscopy in September 2007, which revealed left  colon diverticulosis, internal and external hemorrhoids, and a small  hyperplastic polyp.  He has no colorectal complaints, and denies any  change in bowel habits, change in stool caliber, melena, or  hematochezia.   CURRENT MEDICATIONS:  Listed on the chart, updated and reviewed.   MEDICATION ALLERGIES:  1. SULFA.  2. PENICILLIN.   EXAMINATION:  Well-developed, overweight, white male in no acute  distress.  Weight 242 pounds.  Blood pressure 142/90.  Pulse 64 and regular.  HEENT EXAM:  Anicteric sclerae.  Oropharynx clear.  CHEST:  Clear to auscultation bilaterally.  CARDIAC:  Regular rate and rhythm without murmurs.  ABDOMEN:  Soft, nontender, non-distended, normoactive bowel sounds.  No  palpable organomegaly, masses, or hernias.   ASSESSMENT AND PLAN:  1. Gastroesophageal reflux disease.  Maintain longterm antireflux      measures.  Renew Aciphex 20 mg p.o. q.a.m.  Endoscopy in July 2000      showed a small hiatal hernia, mild gastritis, and duodenitis.      There was no evidence of erosive esophagitis or Barrett's      esophagus.  Return office visit with me in 1 year.  Otherwise, he      may return to Dr. Alwyn Ren for refills of Aciphex, and I will see as      needed.  2. Personal history of adenomatous colon polyp.  Surveillance      colonoscopy recommended for September 2012.     Venita Lick. Russella Dar, MD, [facility]  Electronically Signed    MTS/MedQ  DD: 10/02/2007  DT: 10/02/2007  Job #: [id number]   cc:   Titus Dubin. Alwyn Ren, MD,FACP,FCCP

## 2010-11-17 NOTE — Assessment & Plan Note (Signed)
[facility]                         GASTROENTEROLOGY OFFICE NOTE   Marcus Crawford, Marcus Crawford                       MRN:          [id number]  DATE:07/24/2006                            DOB:          1950-11-10    This very nice patient comes in for refill of AcipHex.  He says  everything is normal.  No GI complaints.   PHYSICAL EXAMINATION:  VITAL SIGNS:  Weight is 239.  Blood pressure  142/86, pulse 72 and regular.  CARDIAC:  Unremarkable.  EXTREMITIES:  Unremarkable.  RECTAL:  Deferred.   IMPRESSION:  1. Gastroesophageal reflux disease controlled nicely with AcipHex.  2. Status post colon polyps with diverticular disease.  3. History of hypertension.   RECOMMENDATIONS:  1. Continue the same medication as he had before.  2. I have suggested that Mr. Sites followup with Dr. Russella Dar in the      future after my retirement when indicated, and I also mentioned      this to Dr. Russella Dar at the time.     Ulyess Mort, MD  Electronically Signed    SML/MedQ  DD: 07/24/2006  DT: 07/24/2006  Job #: [id number]   cc:   Titus Dubin. Alwyn Ren, MD,FACP,FCCP

## 2010-11-23 ENCOUNTER — Other Ambulatory Visit: Payer: Self-pay | Admitting: Internal Medicine

## 2010-11-28 ENCOUNTER — Other Ambulatory Visit: Payer: Self-pay | Admitting: Internal Medicine

## 2010-12-17 ENCOUNTER — Other Ambulatory Visit: Payer: Self-pay | Admitting: Internal Medicine

## 2010-12-18 NOTE — Telephone Encounter (Signed)
Appointment due 04/2011, CPX

## 2011-01-16 ENCOUNTER — Telehealth: Payer: Self-pay | Admitting: *Deleted

## 2011-01-16 ENCOUNTER — Other Ambulatory Visit: Payer: Self-pay | Admitting: Internal Medicine

## 2011-01-16 DIAGNOSIS — K219 Gastro-esophageal reflux disease without esophagitis: Secondary | ICD-10-CM

## 2011-01-16 DIAGNOSIS — E785 Hyperlipidemia, unspecified: Secondary | ICD-10-CM

## 2011-01-16 DIAGNOSIS — D126 Benign neoplasm of colon, unspecified: Secondary | ICD-10-CM

## 2011-01-16 DIAGNOSIS — I10 Essential (primary) hypertension: Secondary | ICD-10-CM

## 2011-01-16 NOTE — Telephone Encounter (Addendum)
Appt scheduled for tomorrow. Pt would like to know if he can have his PSA checked also. Please advise.  Was last done 03/2010.

## 2011-01-16 NOTE — Telephone Encounter (Signed)
Pt notes that he comes every 6 months for lab and he would like to know what is due now and does he need ov. Pt most recent labs (Nov 2011) do not specify follow up. Please advise.

## 2011-01-16 NOTE — Telephone Encounter (Signed)
Added to appt notes

## 2011-01-16 NOTE — Telephone Encounter (Signed)
Copied from staff message:  Pecola Lawless, MD More Detail >>      Pecola Lawless, MD        Sent: Tue January 16, 2011 11:22 AM    To: Alease Medina, CMA        Marcus Crawford    MRN: [id number] DOB: 06-Oct-1950     Pt Work: [phone] Pt Home: [phone]           Message     Please  schedule fasting Labs : BMET,Lipids, hepatic panel, CBC & dif, TSH (272.4, 530.81, 401.9, 211.3)

## 2011-01-16 NOTE — Telephone Encounter (Signed)
Ok to add PSA v16.42

## 2011-01-17 ENCOUNTER — Other Ambulatory Visit (INDEPENDENT_AMBULATORY_CARE_PROVIDER_SITE_OTHER): Payer: BC Managed Care – PPO

## 2011-01-17 DIAGNOSIS — E785 Hyperlipidemia, unspecified: Secondary | ICD-10-CM

## 2011-01-17 DIAGNOSIS — K219 Gastro-esophageal reflux disease without esophagitis: Secondary | ICD-10-CM

## 2011-01-17 DIAGNOSIS — D126 Benign neoplasm of colon, unspecified: Secondary | ICD-10-CM

## 2011-01-17 DIAGNOSIS — Z8042 Family history of malignant neoplasm of prostate: Secondary | ICD-10-CM

## 2011-01-17 DIAGNOSIS — I10 Essential (primary) hypertension: Secondary | ICD-10-CM

## 2011-01-17 LAB — CBC WITH DIFFERENTIAL/PLATELET
Basophils Absolute: 0 10*3/uL (ref 0.0–0.1)
Basophils Relative: 0.8 % (ref 0.0–3.0)
Eosinophils Absolute: 0.2 10*3/uL (ref 0.0–0.7)
Eosinophils Relative: 4.8 % (ref 0.0–5.0)
HCT: 43.4 % (ref 39.0–52.0)
Hemoglobin: 14.8 g/dL (ref 13.0–17.0)
Lymphocytes Relative: 31.7 % (ref 12.0–46.0)
Lymphs Abs: 1.6 10*3/uL (ref 0.7–4.0)
MCHC: 34 g/dL (ref 30.0–36.0)
MCV: 89.6 fl (ref 78.0–100.0)
Monocytes Absolute: 0.4 10*3/uL (ref 0.1–1.0)
Monocytes Relative: 8.8 % (ref 3.0–12.0)
Neutro Abs: 2.8 10*3/uL (ref 1.4–7.7)
Neutrophils Relative %: 53.9 % (ref 43.0–77.0)
Platelets: 162 10*3/uL (ref 150.0–400.0)
RBC: 4.84 Mil/uL (ref 4.22–5.81)
RDW: 13.7 % (ref 11.5–14.6)
WBC: 5.1 10*3/uL (ref 4.5–10.5)

## 2011-01-17 LAB — BASIC METABOLIC PANEL
BUN: 16 mg/dL (ref 6–23)
CO2: 34 mEq/L — ABNORMAL HIGH (ref 19–32)
Calcium: 8.8 mg/dL (ref 8.4–10.5)
Chloride: 107 mEq/L (ref 96–112)
Creatinine, Ser: 1.2 mg/dL (ref 0.4–1.5)
GFR: 67.05 mL/min (ref >60.00)
Glucose, Bld: 121 mg/dL — ABNORMAL HIGH (ref 70–99)
Potassium: 3.5 mEq/L (ref 3.5–5.1)
Sodium: 146 mEq/L — ABNORMAL HIGH (ref 135–145)

## 2011-01-17 LAB — LIPID PANEL
Cholesterol: 193 mg/dL (ref 0–200)
HDL: 40.1 mg/dL (ref >39.00)
LDL Cholesterol: 119 mg/dL — ABNORMAL HIGH (ref 0–99)
Total CHOL/HDL Ratio: 5
Triglycerides: 169 mg/dL — ABNORMAL HIGH (ref 0.0–149.0)
VLDL: 33.8 mg/dL (ref 0.0–40.0)

## 2011-01-17 LAB — HEPATIC FUNCTION PANEL
ALT: 32 U/L (ref 0–53)
AST: 13 U/L (ref 0–37)
Albumin: 4.2 g/dL (ref 3.5–5.2)
Alkaline Phosphatase: 66 U/L (ref 39–117)
Bilirubin, Direct: 0.2 mg/dL (ref 0.0–0.3)
Total Bilirubin: 1.2 mg/dL (ref 0.3–1.2)
Total Protein: 6.1 g/dL (ref 6.0–8.3)

## 2011-01-17 LAB — TSH: TSH: 2.44 u[IU]/mL (ref 0.35–5.50)

## 2011-01-17 LAB — PSA: PSA: 3.54 ng/mL (ref 0.10–4.00)

## 2011-01-17 NOTE — Progress Notes (Signed)
Labs only

## 2011-01-22 LAB — HEMOGLOBIN A1C: Hgb A1c MFr Bld: 6.1 % (ref 4.6–6.5)

## 2011-01-25 ENCOUNTER — Encounter: Payer: Self-pay | Admitting: Internal Medicine

## 2011-01-26 ENCOUNTER — Encounter: Payer: Self-pay | Admitting: Internal Medicine

## 2011-01-26 ENCOUNTER — Ambulatory Visit (INDEPENDENT_AMBULATORY_CARE_PROVIDER_SITE_OTHER): Payer: BC Managed Care – PPO | Admitting: Internal Medicine

## 2011-01-26 DIAGNOSIS — K219 Gastro-esophageal reflux disease without esophagitis: Secondary | ICD-10-CM

## 2011-01-26 DIAGNOSIS — R7309 Other abnormal glucose: Secondary | ICD-10-CM

## 2011-01-26 DIAGNOSIS — R972 Elevated prostate specific antigen [PSA]: Secondary | ICD-10-CM

## 2011-01-26 DIAGNOSIS — I10 Essential (primary) hypertension: Secondary | ICD-10-CM

## 2011-01-26 DIAGNOSIS — E785 Hyperlipidemia, unspecified: Secondary | ICD-10-CM

## 2011-01-26 MED ORDER — DILTIAZEM HCL ER COATED BEADS 240 MG PO TB24: 240.0000 mg | ORAL_TABLET | Freq: Every day | ORAL | Status: DC

## 2011-01-26 MED ORDER — RABEPRAZOLE SODIUM 20 MG PO TBEC: 20.0000 mg | DELAYED_RELEASE_TABLET | Freq: Every day | ORAL | Status: DC

## 2011-01-26 MED ORDER — BENAZEPRIL HCL 40 MG PO TABS: 40.0000 mg | ORAL_TABLET | Freq: Every day | ORAL | Status: DC

## 2011-01-26 NOTE — Patient Instructions (Addendum)
The triggers for dyspepsia or "heart burn"  include stress; the "aspirin family" ; alcohol; peppermint; and caffeine (coffee, tea, cola, and chocolate). The aspirin family would include aspirin and the nonsteroidal agents such as ibuprofen &  Naproxen. Tylenol would not cause reflux. If having dyspepsia ; food & drink should be avoided for @ least 2 hours before going to bed.  Eat a low-fat diet with lots of fruits and vegetables, up to 7-9 servings per day. Consume less than 40 grams of sugar per day from foods & drinks with High Fructose Corn Sugar as #1,2,3 or # 4 on label. Follow the low carb nutrition program in [employer] as closely as possible to prevent Diabetes . White carbohydrates (potatoes, rice, bread, and pasta) have a high spike of sugar and a high load of sugar. For example a  baked potato has a cup of sugar and a  french fry  2 teaspoons of sugar. Yams, wild  rice, whole grained bread &  wheat pasta have been much lower spike and load of  sugar. Portions should be the size of a deck of cards or your palm.  Please  schedule fasting Labs in 3 months : A1c,Lipids, PSA. (see Diagnoses for )

## 2011-01-26 NOTE — Progress Notes (Signed)
Subjective:    Patient ID: Marcus Crawford, male    DOB: 18-Nov-1950, 60 y.o.   MRN: [id number]  HPI #1  HYPERTENSION: Disease Monitoring: Blood pressure range-145-148/85  Chest pain, palpitations, claudication- no       Dyspnea- no Medications: Compliance- yes  Lightheadedness,Syncope- no    Edema- no  #2 Fasting Hyperglycemia : FBS: 121. Serial glucoses previously had ranged from 102- 112 Polyuria/phagia/dipsia- no       Visual problems- no Hypoglycemic symptoms- no FH : bro & sister had DM.  #3 HYPERLIPIDEMIA: Disease Monitoring: See symptoms for Hypertension Medications: Compliance- no statin Triglycerides have risen from 80 to a value of 169.   #4 increasing PSA: PSA was 2.63 in September 2001. It is now 3.54, a rise of 0.91. His brother had prostate cancer.  PMH Smoking Status: never       Review of SystemsAbd pain, bowel changes- no   Muscle aches- no He denies hematuria, pyuria, or dysuria. He has no constitutional symptoms of fever, chills, sweats, weight loss.     Objective:   Physical Exam Gen.: Healthy and well-nourished in appearance. Alert, appropriate and cooperative throughout exam. Eyes: No corneal or conjunctival inflammation noted.  Neck: No deformities, masses, or tenderness noted. Thyroid normal. Lungs: Normal respiratory effort; chest expands symmetrically. Lungs are clear to auscultation without rales, wheezes, or increased work of breathing. Heart: Normal rate and rhythm. Normal S1 and S2. No gallop, click, or rub. No murmur. Abdomen: Bowel sounds normal; abdomen soft and nontender. No masses, organomegaly or hernias noted. DRE: normal prostate   .                                                                                   Musculoskeletal/extremities: No clubbing, cyanosis, edema, or deformity noted. Nail health  good. Vascular: Carotid, radial artery, dorsalis pedis and  posterior tibial pulses are full and equal. No bruits  present. Neurologic: Alert and oriented x3. Deep tendon reflexes symmetrical and normal.          Skin: Intact without suspicious lesions or rashes. Lymph: No cervical, axillary  lymphadenopathy present. Psych: Mood and affect are normal. Normally interactive                                                                                         Assessment & Plan:   #1 hypertension, blood pressure essentially at goal. His days at home are higher; I would request him to bring his cuff to the office to validate these readings  #2 dyslipidemia; LDL goal is less than 100. It is now 119, triglycerides and glucose have risen suggesting a dietary component  #3 fasting hyperglycemia, family history of diabetes  #4 PSA rising; velocity of rise of 0.9 1 over10 months. His brother does have prostate cancer. He has been  using a riding lawnmower up to 4 hours a week. Normal digital rectal exam  Plan: Nutritional intervention with repeat labs in 3 months. A statin we'll not be initiated at this time because of the dietary factors suggested above.

## 2011-03-15 ENCOUNTER — Ambulatory Visit (AMBULATORY_SURGERY_CENTER): Payer: BC Managed Care – PPO | Admitting: *Deleted

## 2011-03-15 ENCOUNTER — Encounter: Payer: Self-pay | Admitting: Gastroenterology

## 2011-03-15 VITALS — Ht 73.0 in | Wt 226.0 lb

## 2011-03-15 DIAGNOSIS — Z1211 Encounter for screening for malignant neoplasm of colon: Secondary | ICD-10-CM

## 2011-03-15 MED ORDER — SUPREP BOWEL PREP KIT 17.5-3.13-1.6 GM/177ML PO SOLN: 1.0000 | Freq: Once | ORAL | Status: DC

## 2011-03-29 ENCOUNTER — Other Ambulatory Visit: Payer: BC Managed Care – PPO

## 2011-03-29 ENCOUNTER — Encounter: Payer: Self-pay | Admitting: Gastroenterology

## 2011-03-29 ENCOUNTER — Ambulatory Visit (AMBULATORY_SURGERY_CENTER): Payer: BC Managed Care – PPO | Admitting: Gastroenterology

## 2011-03-29 VITALS — BP 150/91 | HR 45 | Temp 97.1°F | Resp 17 | Ht 73.0 in | Wt 226.0 lb

## 2011-03-29 DIAGNOSIS — Z860100 Personal history of colon polyps, unspecified: Secondary | ICD-10-CM

## 2011-03-29 DIAGNOSIS — D126 Benign neoplasm of colon, unspecified: Secondary | ICD-10-CM

## 2011-03-29 DIAGNOSIS — Z1211 Encounter for screening for malignant neoplasm of colon: Secondary | ICD-10-CM

## 2011-03-29 MED ORDER — SODIUM CHLORIDE 0.9 % IV SOLN: 500.0000 mL | INTRAVENOUS | Status: DC

## 2011-03-29 NOTE — Patient Instructions (Signed)
Discharge instructions given with verbal understanding. Handout on polyps given. Resume previous medications. 

## 2011-03-30 ENCOUNTER — Telehealth: Payer: Self-pay | Admitting: *Deleted

## 2011-03-30 NOTE — Telephone Encounter (Signed)
No id on answering machine.  No message left.

## 2011-04-03 ENCOUNTER — Encounter: Payer: Self-pay | Admitting: Gastroenterology

## 2011-04-11 ENCOUNTER — Telehealth: Payer: Self-pay | Admitting: *Deleted

## 2011-04-11 NOTE — Telephone Encounter (Signed)
Message copied by Virgel Paling on Wed Apr 11, 2011  3:27 PM ------      Message from: Claudette Head T      Created: Wed Mar 14, 2011 11:47 AM       Given family history of colon cancer in his mother, he is due now for a 5 year recall.  MS            ----- Message -----         From: Francee Gentile, RN         Sent: 03/14/2011  11:23 AM           To: Meryl Dare, MD,FACG            When I called pt. He said his mother died of colon cancer in 1993/11/24 and he feels it should be done now.Please advise                                             Elnita Maxwell

## 2011-04-11 NOTE — Telephone Encounter (Signed)
Message copied by Virgel Paling on Wed Apr 11, 2011  3:26 PM ------      Message from: Claudette Head T      Created: Wed Mar 14, 2011 11:47 AM       Given family history of colon cancer in his mother, he is due now for a 5 year recall.  MS            ----- Message -----         From: Francee Gentile, RN         Sent: 03/14/2011  11:23 AM           To: Meryl Dare, MD,FACG            When I called pt. He said his mother died of colon cancer in 12-10-1993 and he feels it should be done now.Please advise                                             Elnita Maxwell

## 2011-04-25 NOTE — Telephone Encounter (Signed)
Pt. due for colon now do to fam. Hx. Per Dr.Stark

## 2011-04-27 ENCOUNTER — Other Ambulatory Visit: Payer: Self-pay | Admitting: Internal Medicine

## 2011-04-27 DIAGNOSIS — I10 Essential (primary) hypertension: Secondary | ICD-10-CM

## 2011-04-27 DIAGNOSIS — R7309 Other abnormal glucose: Secondary | ICD-10-CM

## 2011-04-27 DIAGNOSIS — K219 Gastro-esophageal reflux disease without esophagitis: Secondary | ICD-10-CM

## 2011-04-27 DIAGNOSIS — E785 Hyperlipidemia, unspecified: Secondary | ICD-10-CM

## 2011-04-27 DIAGNOSIS — R972 Elevated prostate specific antigen [PSA]: Secondary | ICD-10-CM

## 2011-04-30 ENCOUNTER — Other Ambulatory Visit (INDEPENDENT_AMBULATORY_CARE_PROVIDER_SITE_OTHER): Payer: BC Managed Care – PPO

## 2011-04-30 DIAGNOSIS — R7309 Other abnormal glucose: Secondary | ICD-10-CM

## 2011-04-30 DIAGNOSIS — Z23 Encounter for immunization: Secondary | ICD-10-CM

## 2011-04-30 DIAGNOSIS — E785 Hyperlipidemia, unspecified: Secondary | ICD-10-CM

## 2011-04-30 DIAGNOSIS — I10 Essential (primary) hypertension: Secondary | ICD-10-CM

## 2011-04-30 DIAGNOSIS — K219 Gastro-esophageal reflux disease without esophagitis: Secondary | ICD-10-CM

## 2011-04-30 DIAGNOSIS — R972 Elevated prostate specific antigen [PSA]: Secondary | ICD-10-CM

## 2011-04-30 LAB — LIPID PANEL
Cholesterol: 213 mg/dL — ABNORMAL HIGH (ref 0–200)
HDL: 46.5 mg/dL (ref >39.00)
Triglycerides: 144 mg/dL (ref 0.0–149.0)
VLDL: 28.8 mg/dL (ref 0.0–40.0)

## 2011-04-30 LAB — LDL CHOLESTEROL, DIRECT: Direct LDL: 148.5 mg/dL

## 2011-04-30 LAB — HEMOGLOBIN A1C: Hgb A1c MFr Bld: 5.8 % (ref 4.6–6.5)

## 2011-04-30 NOTE — Progress Notes (Signed)
Labs only

## 2011-07-03 DIAGNOSIS — N200 Calculus of kidney: Secondary | ICD-10-CM

## 2011-07-03 HISTORY — DX: Calculus of kidney: N20.0

## 2011-09-13 ENCOUNTER — Telehealth: Payer: Self-pay | Admitting: *Deleted

## 2011-09-13 ENCOUNTER — Ambulatory Visit (INDEPENDENT_AMBULATORY_CARE_PROVIDER_SITE_OTHER): Payer: BC Managed Care – PPO | Admitting: Internal Medicine

## 2011-09-13 ENCOUNTER — Encounter: Payer: Self-pay | Admitting: Internal Medicine

## 2011-09-13 VITALS — BP 126/90 | HR 58 | Temp 98.2°F | Resp 14 | Ht 73.5 in | Wt 229.8 lb

## 2011-09-13 DIAGNOSIS — I10 Essential (primary) hypertension: Secondary | ICD-10-CM

## 2011-09-13 DIAGNOSIS — Z0000 Encounter for general adult medical examination without abnormal findings: Secondary | ICD-10-CM

## 2011-09-13 DIAGNOSIS — Z23 Encounter for immunization: Secondary | ICD-10-CM

## 2011-09-13 DIAGNOSIS — K219 Gastro-esophageal reflux disease without esophagitis: Secondary | ICD-10-CM

## 2011-09-13 LAB — CBC WITH DIFFERENTIAL/PLATELET
Basophils Absolute: 0 10*3/uL (ref 0.0–0.1)
Hemoglobin: 15.4 g/dL (ref 13.0–17.0)
Lymphocytes Relative: 32.4 % (ref 12.0–46.0)
Monocytes Relative: 10 % (ref 3.0–12.0)
Neutro Abs: 2.7 10*3/uL (ref 1.4–7.7)
Neutrophils Relative %: 53 % (ref 43.0–77.0)
Platelets: 171 10*3/uL (ref 150.0–400.0)
RDW: 13.7 % (ref 11.5–14.6)

## 2011-09-13 LAB — BASIC METABOLIC PANEL
BUN: 12 mg/dL (ref 6–23)
CO2: 32 mEq/L (ref 19–32)
Calcium: 8.9 mg/dL (ref 8.4–10.5)
Chloride: 102 mEq/L (ref 96–112)
Creatinine, Ser: 1.1 mg/dL (ref 0.4–1.5)
Glucose, Bld: 123 mg/dL — ABNORMAL HIGH (ref 70–99)

## 2011-09-13 LAB — TSH: TSH: 1.61 u[IU]/mL (ref 0.35–5.50)

## 2011-09-13 LAB — HEPATIC FUNCTION PANEL
AST: 17 U/L (ref 0–37)
Alkaline Phosphatase: 69 U/L (ref 39–117)
Bilirubin, Direct: 0.1 mg/dL (ref 0.0–0.3)
Total Bilirubin: 0.7 mg/dL (ref 0.3–1.2)

## 2011-09-13 LAB — LIPID PANEL
Total CHOL/HDL Ratio: 5
VLDL: 30 mg/dL (ref 0.0–40.0)

## 2011-09-13 LAB — PSA: PSA: 2.8 ng/mL (ref 0.10–4.00)

## 2011-09-13 LAB — LDL CHOLESTEROL, DIRECT: Direct LDL: 129.8 mg/dL

## 2011-09-13 MED ORDER — BENAZEPRIL HCL 40 MG PO TABS: 40.0000 mg | ORAL_TABLET | Freq: Every day | ORAL | Status: DC

## 2011-09-13 MED ORDER — DILTIAZEM HCL ER COATED BEADS 240 MG PO TB24: 240.0000 mg | ORAL_TABLET | Freq: Every day | ORAL | Status: DC

## 2011-09-13 MED ORDER — RABEPRAZOLE SODIUM 20 MG PO TBEC: 20.0000 mg | DELAYED_RELEASE_TABLET | Freq: Every day | ORAL | Status: DC

## 2011-09-13 NOTE — Progress Notes (Signed)
  Subjective:    Patient ID: Marcus Crawford, male    DOB: 02-07-1951, 61 y.o.   MRN: [id number]  HPI  Mr. Marcus Crawford  is here for a physical; he denies acute issues.     Review of Systems HYPERTENSION: Disease Monitoring: Blood pressure range-not checked  Chest pain, palpitations- no       Dyspnea- no Medications: Compliance- yes  Lightheadedness,Syncope- no   Edema- no  FASTING HYPERGLYCEMIA: Polyuria/phagia/dipsia- no       Visual problems- no FH DM in bro & sister  HYPERLIPIDEMIA: Disease Monitoring: See symptoms for Hypertension Medications: Compliance- not on  Statin   Abd pain, bowel changes- no.He specifically denies dysphagia, significant dyspepsia, abdominal pain, unexplained weight loss, melena, or rectal bleeding.  Muscle aches- no; he has had some chronic low back issues. He believes he may have an extra lumbosacral disc          Objective:   Physical Exam Gen.: Healthy and well-nourished in appearance. Alert, appropriate and cooperative throughout exam. Head: Normocephalic without obvious abnormalities; no alopecia  Eyes: No corneal or conjunctival inflammation noted. Pupils equal round reactive to light and accommodation. Fundal exam is benign without hemorrhages, exudate, papilledema. Extraocular motion intact. Vision grossly normal. Ears: External  ear exam reveals no significant lesions or deformities. Canals clear .TMs normal. Hearing is grossly normal bilaterally. Nose: External nasal exam reveals no deformity or inflammation. Nasal mucosa are pink and moist. No lesions or exudates noted.  Mouth: Oral mucosa and oropharynx reveal no lesions or exudates. Teeth in good repair. Neck: No deformities, masses, or tenderness noted. Range of motion & Thyroid normal. Lungs: Normal respiratory effort; chest expands symmetrically. Lungs are clear to auscultation without rales, wheezes, or increased work of breathing. Heart: Normal rate and rhythm. Normal S1 and S2.  No gallop, click, or rub.Grade 1/2 over 6 systolic murmur  Abdomen: Bowel sounds normal; abdomen soft and nontender. No masses, organomegaly or hernias noted. Genitalia/DRE: Genital and rectal exams are normal. The prostate is small without asymmetry, nodularity, or induration                   Musculoskeletal/extremities: No deformity or scoliosis noted of  the thoracic or lumbar spine. No clubbing, cyanosis, edema, or deformity noted (minimal DIP changes). Range of motion  normal .Tone & strength  normal.Joints normal. Nail health  good. Vascular: Carotid, radial artery, dorsalis pedis and  posterior tibial pulses are full and equal. No bruits present. Neurologic: Alert and oriented x3. Deep tendon reflexes symmetrical and normal.          Skin: Intact without suspicious lesions or rashes. Lymph: No cervical, axillary, or inguinal lymphadenopathy present. Psych: Mood and affect are normal. Normally interactive                                                                                         Assessment & Plan:  #1 comprehensive physical exam; no acute findings #2 see Problem List with Assessments & Recommendations Plan: see Orders

## 2011-09-13 NOTE — Telephone Encounter (Signed)
Message copied by Verdene Rio on Thu Sep 13, 2011  5:35 PM ------      Message from: Pecola Lawless      Created: Thu Sep 13, 2011  4:18 PM       KCL 20 mEq daily  # 15; K+ reported as 2.8 today!!!Please verify not on HCTZ or other diuretic or steroids.To increase  Potassium (K+) increase citrus fruits & bananas in diet and use the salt substitute No Salt, which contains  potassium , to season food @ the table. Recheck K+ TOMORROW (276.8)

## 2011-09-13 NOTE — Telephone Encounter (Signed)
Discuss with patient, appt scheduled. 

## 2011-09-13 NOTE — Patient Instructions (Signed)
The best exercises for the low back include freestyle swimming, stretch aerobics, and yoga. The triggers for dyspepsia or "heart burn"  include stress; the "aspirin family" ; alcohol; peppermint; and caffeine (coffee, tea, cola, and chocolate). The aspirin family would include aspirin and the nonsteroidal agents such as ibuprofen &  Naproxen. Tylenol would not cause reflux. If having dyspepsia ; food & drink should be avoided for @ least 2 hours before going to bed.  Exercise at least 30-45 minutes a day,  3-4 days a week.  Eat a low-fat diet with lots of fruits and vegetables, up to 7-9 servings per day. Consume less than 40 grams of sugar per day from foods & drinks with High Fructose Corn Sugar as # 1,2,3 or # 4 on label.

## 2011-09-14 ENCOUNTER — Telehealth: Payer: Self-pay | Admitting: Internal Medicine

## 2011-09-14 ENCOUNTER — Telehealth: Payer: Self-pay

## 2011-09-14 ENCOUNTER — Other Ambulatory Visit (INDEPENDENT_AMBULATORY_CARE_PROVIDER_SITE_OTHER): Payer: BC Managed Care – PPO

## 2011-09-14 DIAGNOSIS — E876 Hypokalemia: Secondary | ICD-10-CM

## 2011-09-14 MED ORDER — POTASSIUM CHLORIDE CRYS ER 20 MEQ PO TBCR: 20.0000 meq | EXTENDED_RELEASE_TABLET | Freq: Every day | ORAL | Status: DC

## 2011-09-14 NOTE — Telephone Encounter (Signed)
Potassium chloride 20 mEq one daily dispense 30; ? done yesterday ?

## 2011-09-14 NOTE — Telephone Encounter (Signed)
See previous note

## 2011-09-14 NOTE — Telephone Encounter (Signed)
Dr.Hopper stated he also advise for patient to take potassium twice daily until advised otherwise. Med list updated

## 2011-09-14 NOTE — Telephone Encounter (Signed)
2.4 level (4 points lower than yesterday). Per Dr.Hopper patient to be seen Monday, bring all meds (including supplements), patient to be fasting

## 2011-09-14 NOTE — Telephone Encounter (Signed)
Dr.Hopper please advise potassium was low in which you were informed of yesterday

## 2011-09-17 ENCOUNTER — Encounter: Payer: Self-pay | Admitting: Internal Medicine

## 2011-09-17 ENCOUNTER — Other Ambulatory Visit (INDEPENDENT_AMBULATORY_CARE_PROVIDER_SITE_OTHER): Payer: BC Managed Care – PPO

## 2011-09-17 ENCOUNTER — Ambulatory Visit (INDEPENDENT_AMBULATORY_CARE_PROVIDER_SITE_OTHER): Payer: BC Managed Care – PPO | Admitting: Internal Medicine

## 2011-09-17 VITALS — BP 128/80 | HR 56 | Temp 97.4°F | Wt 230.4 lb

## 2011-09-17 DIAGNOSIS — I10 Essential (primary) hypertension: Secondary | ICD-10-CM

## 2011-09-17 DIAGNOSIS — E876 Hypokalemia: Secondary | ICD-10-CM

## 2011-09-17 LAB — BASIC METABOLIC PANEL
GFR: 71.09 mL/min (ref >60.00)
Glucose, Bld: 111 mg/dL — ABNORMAL HIGH (ref 70–99)
Potassium: 2.9 mEq/L — ABNORMAL LOW (ref 3.5–5.1)
Sodium: 145 mEq/L (ref 135–145)

## 2011-09-17 MED ORDER — POTASSIUM CHLORIDE CRYS ER 20 MEQ PO TBCR: 20.0000 meq | EXTENDED_RELEASE_TABLET | Freq: Every day | ORAL | Status: DC

## 2011-09-17 MED ORDER — SPIRONOLACTONE 25 MG PO TABS: 25.0000 mg | ORAL_TABLET | Freq: Every day | ORAL | Status: DC

## 2011-09-17 MED ORDER — BENAZEPRIL HCL 40 MG PO TABS: 20.0000 mg | ORAL_TABLET | Freq: Every day | ORAL | Status: DC

## 2011-09-17 NOTE — Patient Instructions (Signed)
To increase  Potassium (K+) increase citrus fruits & bananas in diet and use the salt substitute No Salt, which contains  potassium , to season food @ the table. Recheck K+ once weekly X  4 weeks .PLEASE BRING THESE INSTRUCTIONS TO FOLLOW UP  LAB APPOINTMENT.This will guarantee correct labs are drawn, eliminating need for repeat blood sampling ( needle sticks ! ). Diagnoses /Codes: 276.8. Risk of premature heart attack or stroke increases as LDL or BAD cholesterol rises.Advanced cholesterol panels optimally determine risk based on particle composition ( NMR Lipoprofile ) or by assessing multiple other genetic risks([facility] Heart Panel or [facility] Lipid Panel). These are indicated when LDL is > 130, especially if there is family history of heart attack in males before 17 or women before 87. Based on your prior advanced testing, your LDL goal is < 100 , ideally < 70. Your present LDL increases long term heart attack or stroke risk 30 %.The best dietary  information on cholesterol is Dr Gildardo Griffes book Eat, Drink & Be Healthy.  Please take enteric-coated aspirin 81 mg daily with breakfast.

## 2011-09-17 NOTE — Progress Notes (Signed)
  Subjective:    Patient ID: Marcus Crawford, male    DOB: Jan 21, 1951, 61 y.o.   MRN: [id number]  HPI He has asymptomatic hypokalemia; potassium was 2.8 on 09/13/11; it was rechecked 3/15 was 2.4. Over the last 3 days he's been on 20 mEq twice a day; potassium is now 2.9. Cortisol this morning was 1.6. His medications were reviewed; he is not on steroids or diuretics.  His sister may have had hypokalemia siblings unsure as to the cause.  In September 2011 his potassium was 2.6, the initial episode of low K+.    Review of Systems   He denies diarrhea or palpitations. He has no symptoms to suggest hypokalemic periodic paralysis.     Objective:   Physical Exam He appears healthy and well-nourished; he is in no acute distress  No carotid bruits are present.  Heart rhythm is  Normal; rate is slow  with no significant murmurs or gallops.  Chest is clear with no increased work of breathing  There is no evidence of aortic aneurysm or renal artery bruits  He has no clubbing or edema.   Pedal pulses are intact   No ischemic skin changes are present         Assessment & Plan:

## 2011-09-17 NOTE — Assessment & Plan Note (Signed)
Blood pressure is well controlled. Because of profound hypokalemia, spironolactone 25 mg will be added and his ACE inhibitor decreased to 40 mg one half pill daily.

## 2011-09-17 NOTE — Assessment & Plan Note (Signed)
Potassium will be decreased to one pill daily; ACE inhibitor to 40 mg one half daily and spironolactone 25 mg daily. Potassium should be checked weekly until it is normal.

## 2011-09-24 ENCOUNTER — Other Ambulatory Visit (INDEPENDENT_AMBULATORY_CARE_PROVIDER_SITE_OTHER): Payer: BC Managed Care – PPO

## 2011-09-24 DIAGNOSIS — E876 Hypokalemia: Secondary | ICD-10-CM

## 2011-09-24 LAB — POTASSIUM: Potassium: 3.7 mEq/L (ref 3.5–5.1)

## 2011-09-25 ENCOUNTER — Encounter: Payer: Self-pay | Admitting: Internal Medicine

## 2011-10-01 ENCOUNTER — Other Ambulatory Visit (INDEPENDENT_AMBULATORY_CARE_PROVIDER_SITE_OTHER): Payer: BC Managed Care – PPO

## 2011-10-01 DIAGNOSIS — E876 Hypokalemia: Secondary | ICD-10-CM

## 2011-10-01 LAB — POTASSIUM: Potassium: 4 mEq/L (ref 3.5–5.1)

## 2011-10-02 ENCOUNTER — Telehealth: Payer: Self-pay | Admitting: *Deleted

## 2011-10-02 NOTE — Telephone Encounter (Signed)
Patient aware of labs results. Patient informed once Dr.Hopper addresses lab value it will be released to him through Mychart

## 2011-10-02 NOTE — Telephone Encounter (Signed)
Pt left vm requesting lab results, please advise

## 2011-10-08 ENCOUNTER — Other Ambulatory Visit: Payer: BC Managed Care – PPO

## 2011-10-12 ENCOUNTER — Encounter: Payer: Self-pay | Admitting: Internal Medicine

## 2011-10-25 ENCOUNTER — Encounter: Payer: BC Managed Care – PPO | Admitting: Internal Medicine

## 2011-10-31 ENCOUNTER — Other Ambulatory Visit (INDEPENDENT_AMBULATORY_CARE_PROVIDER_SITE_OTHER): Payer: BC Managed Care – PPO

## 2011-10-31 DIAGNOSIS — E876 Hypokalemia: Secondary | ICD-10-CM

## 2011-10-31 LAB — POTASSIUM: Potassium: 3.1 mEq/L — ABNORMAL LOW (ref 3.5–5.1)

## 2011-11-01 ENCOUNTER — Other Ambulatory Visit: Payer: Self-pay | Admitting: Internal Medicine

## 2011-11-01 ENCOUNTER — Encounter: Payer: Self-pay | Admitting: Internal Medicine

## 2011-11-01 NOTE — Telephone Encounter (Signed)
Refill done.  

## 2011-11-13 ENCOUNTER — Telehealth: Payer: Self-pay | Admitting: Internal Medicine

## 2011-11-13 DIAGNOSIS — I10 Essential (primary) hypertension: Secondary | ICD-10-CM

## 2011-11-13 NOTE — Telephone Encounter (Signed)
I will send to a MD to advise, future orders placed

## 2011-11-13 NOTE — Telephone Encounter (Signed)
Yes that is fine

## 2011-11-13 NOTE — Telephone Encounter (Signed)
Pt was told by Dr. Alwyn Ren to come back in 2 weeks from 10/31/11 for repeat labs. He started taking a new medication on 10/31/11 and will it be okay for him to wait until 11/22/11 to have his repeat labs done? Please advise.

## 2011-11-13 NOTE — Telephone Encounter (Deleted)
Yes that is ok, future order placed

## 2011-11-22 ENCOUNTER — Other Ambulatory Visit: Payer: BC Managed Care – PPO

## 2011-11-23 ENCOUNTER — Other Ambulatory Visit (INDEPENDENT_AMBULATORY_CARE_PROVIDER_SITE_OTHER): Payer: BC Managed Care – PPO

## 2011-11-23 DIAGNOSIS — I10 Essential (primary) hypertension: Secondary | ICD-10-CM

## 2011-11-23 NOTE — Progress Notes (Signed)
Labs only

## 2011-11-26 ENCOUNTER — Encounter: Payer: Self-pay | Admitting: Internal Medicine

## 2011-11-27 ENCOUNTER — Other Ambulatory Visit: Payer: Self-pay | Admitting: Internal Medicine

## 2011-11-27 DIAGNOSIS — E876 Hypokalemia: Secondary | ICD-10-CM

## 2011-12-14 ENCOUNTER — Encounter: Payer: Self-pay | Admitting: Endocrinology

## 2011-12-14 ENCOUNTER — Ambulatory Visit (INDEPENDENT_AMBULATORY_CARE_PROVIDER_SITE_OTHER): Payer: BC Managed Care – PPO | Admitting: Endocrinology

## 2011-12-14 VITALS — BP 138/88 | HR 58 | Temp 97.0°F | Ht 73.0 in | Wt 232.0 lb

## 2011-12-14 DIAGNOSIS — E876 Hypokalemia: Secondary | ICD-10-CM

## 2011-12-14 NOTE — Progress Notes (Signed)
Subjective:    Patient ID: Marcus Crawford, male    DOB: 1951-03-17, 61 y.o.   MRN: [id number]  HPI Pt says he has had HTN x approx 15 years, and hypokalemia x 9 mos.  No swelling of the legs.  He has assoc muscle cramps.  He does not eat licorice.  He says the potassium was normal on the klor, and low off it.  He has been back on the aldactone x 3 mos (he had taken it x 1 month in 2012).  Past Medical History  Diagnosis Date  . Colonic polyp     X3 ,hyperplastic  . Diverticulosis of colon   . Hyperlipidemia   . Bell's palsy   . Migraine   . 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 Appendectomy   . Esophageal dilation 2001    Dr Corinda Gubler  . Colonoscopy     last 9/12, Dr Russella Dar  . Polypectomy      X 3    History   Social History  . Marital Status: Married    Spouse Name: N/A    Number of Children: N/A  . Years of Education: N/A   Occupational History  . Not on file.   Social History Main Topics  . Smoking status: Never Smoker   . Smokeless tobacco: Not on file  . Alcohol Use: 1.2 oz/week    2 Cans of beer per week  . Drug Use: No  . Sexually Active: Not on file   Other Topics Concern  . Not on file   Social History Narrative  . No narrative on file    Current Outpatient Prescriptions on File Prior to Visit  Medication Sig Dispense Refill  . benazepril (LOTENSIN) 40 MG tablet Take 0.5 tablets (20 mg total) by mouth daily.  90 tablet  3  . diltiazem (MATZIM LA) 240 MG 24 hr tablet Take 1 tablet (240 mg total) by mouth daily.  90 tablet  3  . fish oil-omega-3 fatty acids 1000 MG capsule Take 2 g by mouth daily.        Marland Kitchen KLOR-CON M20 20 MEQ tablet take 1 tablet by mouth once daily  15 tablet  4  . Multiple Vitamins-Minerals (MULTIVITAMIN WITH MINERALS) tablet Take 1 tablet by mouth daily.        . RABEprazole (ACIPHEX) 20 MG tablet Take 1 tablet (20 mg total) by mouth daily.  30 tablet  11  . spironolactone (ALDACTONE) 25 MG tablet  Take 1 tablet (25 mg total) by mouth daily.  30 tablet  2  . DISCONTD: potassium chloride SA (K-DUR,KLOR-CON) 20 MEQ tablet Take 1 tablet (20 mEq total) by mouth daily.        Allergies  Allergen Reactions  . Penicillins     hives  . Sulfonamide Derivatives     rash    Family History  Problem Relation Age of Onset  . Prostate cancer Brother 80  . Heart failure Brother   . Hypertension Brother   . Lung cancer Sister     smoker  . Breast cancer Sister   . Kidney cancer Brother   . Colon cancer Mother 27  . Diabetes Sister   . Diabetes Brother   . Heart attack Mother 75  . Heart attack Brother 78  neg for hypokalemia.   3 sibs all have HTN  BP 138/88  Pulse 58  Temp 97 F (36.1  C) (Oral)  Ht 6\' 1"  (1.854 m)  Wt 232 lb (105.235 kg)  BMI 30.61 kg/m2  SpO2 97%  Review of Systems denies weight gain, hair loss, excessive diaphoresis, erectile dysfunction, sob, insomnia, hyperpigmentation, numbness, muscle weakness, depression, and rash on the abdomen.  He has intermittent headache, urinary frequency, easy bruising, and slight left sided abd pain    Objective:   Physical Exam VS: see vs page GEN: no distress HEAD: head: no deformity eyes: no periorbital swelling, no proptosis external nose and ears are normal mouth: no lesion seen NECK: supple, thyroid is not enlarged CHEST WALL: no deformity.  No "buffalo hump." LUNGS: clear to auscultation BREASTS:  No gynecomastia CV: reg rate and rhythm, no murmur ABD: abdomen is soft, nontender.  no hepatosplenomegaly.  not distended.  no hernia MUSCULOSKELETAL: muscle bulk and strength are grossly normal.  no obvious joint swelling.  gait is normal and steady EXTEMITIES: no deformity.  no edema PULSES: dorsalis pedis intact bilat.  no carotid bruit NEURO:  cn 2-12 grossly intact.   readily moves all 4's.  sensation is intact to touch on the feet SKIN:  Normal texture and temperature.  No rash or suspicious lesion is visible.   No striae. NODES:  None palpable at the neck.   PSYCH: alert, oriented x3.  Does not appear anxious nor depressed. Lab Results  Component Value Date   WBC 5.1 09/13/2011   HGB 15.4 09/13/2011   HCT 45.2 09/13/2011   PLT 171.0 09/13/2011   GLUCOSE 111* 09/17/2011   CHOL 217* 09/13/2011   TRIG 150.0* 09/13/2011   HDL 46.50 09/13/2011   LDLDIRECT 129.8 09/13/2011   LDLCALC 119* 01/17/2011   ALT 36 09/13/2011   AST 17 09/13/2011   NA 145 09/17/2011   K 2.9* 11/23/2011   CL 103 09/17/2011   CREATININE 1.1 11/23/2011   BUN 16 11/23/2011   CO2 34* 09/17/2011   TSH 1.61 09/13/2011   PSA 2.80 09/13/2011   HGBA1C 5.8 04/30/2011   MICROALBUR 0.4 10/14/2008      Assessment & Plan:  Hypokalemia, uncertain etiology HTN.  well-controlled.  He can continue the ACEI. i'll take this into account with the labs [place].  i am uncertain these sxs are due to hypokalemia.

## 2011-12-14 NOTE — Patient Instructions (Addendum)
Stop the spironolactone Continue the potassium  Go to the lab in 2 weeks for blood tests.  Please call 10 days later if you have not heard the results.

## 2011-12-28 ENCOUNTER — Other Ambulatory Visit (INDEPENDENT_AMBULATORY_CARE_PROVIDER_SITE_OTHER): Payer: BC Managed Care – PPO

## 2011-12-28 DIAGNOSIS — E876 Hypokalemia: Secondary | ICD-10-CM

## 2011-12-28 LAB — BASIC METABOLIC PANEL
BUN: 24 mg/dL — ABNORMAL HIGH (ref 6–23)
Chloride: 108 mEq/L (ref 96–112)
GFR: 58.16 mL/min — ABNORMAL LOW (ref >60.00)
Glucose, Bld: 112 mg/dL — ABNORMAL HIGH (ref 70–99)
Potassium: 3.4 mEq/L — ABNORMAL LOW (ref 3.5–5.1)

## 2011-12-28 NOTE — Progress Notes (Signed)
Labs only

## 2012-01-09 ENCOUNTER — Telehealth: Payer: Self-pay | Admitting: *Deleted

## 2012-01-09 ENCOUNTER — Encounter: Payer: Self-pay | Admitting: Internal Medicine

## 2012-01-09 NOTE — Telephone Encounter (Signed)
Patient has question regarding Medical Hx shown on My Chart which shows "AV Block 1st degree"; cannot locate where this diagnosis came from, not on problem list in Epic and/or [facility].? Please advise.

## 2012-01-09 NOTE — Telephone Encounter (Signed)
You are correct; I reviewed the EKGs from 2009 and 2013. First degree heart block would be PR interval greater than 200 ms. You do not have AV block. I corrected this. Thank you

## 2012-01-09 NOTE — Telephone Encounter (Signed)
Discuss with patient  

## 2012-01-10 ENCOUNTER — Telehealth: Payer: Self-pay

## 2012-01-10 DIAGNOSIS — E876 Hypokalemia: Secondary | ICD-10-CM

## 2012-01-10 NOTE — Telephone Encounter (Signed)
i ordered

## 2012-01-10 NOTE — Telephone Encounter (Signed)
Pt informed to come in to have aldosterone/renin test drawn. Pt states that he will come in next week.

## 2012-01-10 NOTE — Telephone Encounter (Signed)
please call lab What is the status of the aldosterone/renin result?

## 2012-01-10 NOTE — Telephone Encounter (Signed)
Pt called requesting results of labs completed 12/28/2011.

## 2012-01-10 NOTE — Telephone Encounter (Signed)
Per lab, pt may not have had aldosterone/renin ration test drawn because lab order was not made future in their system. He will need to come in and have test redrawn.

## 2012-01-15 ENCOUNTER — Ambulatory Visit: Payer: BC Managed Care – PPO | Admitting: Endocrinology

## 2012-01-15 DIAGNOSIS — E2609 Other primary hyperaldosteronism: Secondary | ICD-10-CM

## 2012-01-15 DIAGNOSIS — E876 Hypokalemia: Secondary | ICD-10-CM

## 2012-01-21 ENCOUNTER — Telehealth: Payer: Self-pay

## 2012-01-21 NOTE — Telephone Encounter (Signed)
Pt called requesting the results of ENDO labs completed 07/16, please advise.

## 2012-01-21 NOTE — Telephone Encounter (Signed)
Please cal lab and ask what the status of these is

## 2012-01-21 NOTE — Telephone Encounter (Signed)
Per [place], lab results are still pending. Pt informed.

## 2012-01-23 ENCOUNTER — Other Ambulatory Visit: Payer: Self-pay | Admitting: Internal Medicine

## 2012-01-23 DIAGNOSIS — E2609 Other primary hyperaldosteronism: Secondary | ICD-10-CM | POA: Insufficient documentation

## 2012-01-23 NOTE — Patient Instructions (Signed)
i left message on ans machine. i ordered ct

## 2012-02-08 ENCOUNTER — Other Ambulatory Visit: Payer: Self-pay | Admitting: Endocrinology

## 2012-02-08 ENCOUNTER — Ambulatory Visit (INDEPENDENT_AMBULATORY_CARE_PROVIDER_SITE_OTHER)
Admission: RE | Admit: 2012-02-08 | Discharge: 2012-02-08 | Disposition: A | Discharge status: Home / Self Care | Payer: BC Managed Care – PPO | Source: Ambulatory Visit | Attending: Endocrinology | Admitting: Endocrinology

## 2012-02-08 DIAGNOSIS — N209 Urinary calculus, unspecified: Secondary | ICD-10-CM | POA: Insufficient documentation

## 2012-02-08 DIAGNOSIS — E269 Hyperaldosteronism, unspecified: Secondary | ICD-10-CM

## 2012-02-08 DIAGNOSIS — E2609 Other primary hyperaldosteronism: Secondary | ICD-10-CM

## 2012-02-08 MED ORDER — IOHEXOL 300 MG/ML  SOLN
80.0000 mL | Freq: Once | INTRAMUSCULAR | Status: AC | PRN
  Administered 2012-02-08: 80 mL via INTRAVENOUS

## 2012-02-14 ENCOUNTER — Other Ambulatory Visit: Payer: Self-pay

## 2012-02-14 ENCOUNTER — Other Ambulatory Visit: Payer: Self-pay | Admitting: Oncology

## 2012-02-14 DIAGNOSIS — E269 Hyperaldosteronism, unspecified: Secondary | ICD-10-CM

## 2012-02-21 ENCOUNTER — Telehealth (HOSPITAL_COMMUNITY): Payer: Self-pay

## 2012-02-21 ENCOUNTER — Telehealth: Payer: Self-pay | Admitting: *Deleted

## 2012-02-21 NOTE — Telephone Encounter (Signed)
I left a message for Mr. Marcus Crawford and Corrie Dandy at [facility] to give me a call about the venous sampling.  I have the date and time ready to be scheduled

## 2012-02-21 NOTE — Telephone Encounter (Signed)
Pt wants to know why he is having the Adrenal venous sampling done. Please explain to pt because he is unsure if he wants to schedule procedure.

## 2012-02-21 NOTE — Telephone Encounter (Signed)
i called pt 02/21/12.  We discussed need for adrenal venous sampling

## 2012-02-25 ENCOUNTER — Other Ambulatory Visit: Payer: Self-pay | Admitting: Radiology

## 2012-02-27 ENCOUNTER — Encounter (HOSPITAL_COMMUNITY): Payer: Self-pay | Admitting: Pharmacy Technician

## 2012-02-28 ENCOUNTER — Other Ambulatory Visit: Payer: Self-pay | Admitting: Endocrinology

## 2012-02-28 ENCOUNTER — Encounter (HOSPITAL_COMMUNITY): Payer: Self-pay

## 2012-02-28 ENCOUNTER — Ambulatory Visit (HOSPITAL_COMMUNITY)
Admission: RE | Admit: 2012-02-28 | Discharge: 2012-02-28 | Disposition: A | Discharge status: Home / Self Care | Payer: BC Managed Care – PPO | Source: Ambulatory Visit | Attending: Endocrinology | Admitting: Endocrinology

## 2012-02-28 DIAGNOSIS — E269 Hyperaldosteronism, unspecified: Secondary | ICD-10-CM

## 2012-02-28 DIAGNOSIS — E785 Hyperlipidemia, unspecified: Secondary | ICD-10-CM | POA: Insufficient documentation

## 2012-02-28 DIAGNOSIS — I10 Essential (primary) hypertension: Secondary | ICD-10-CM | POA: Insufficient documentation

## 2012-02-28 DIAGNOSIS — E876 Hypokalemia: Secondary | ICD-10-CM | POA: Insufficient documentation

## 2012-02-28 DIAGNOSIS — E279 Disorder of adrenal gland, unspecified: Secondary | ICD-10-CM | POA: Insufficient documentation

## 2012-02-28 LAB — CBC WITH DIFFERENTIAL/PLATELET
Basophils Relative: 1 % (ref 0–1)
Eosinophils Absolute: 0.3 10*3/uL (ref 0.0–0.7)
Eosinophils Relative: 5 % (ref 0–5)
Lymphs Abs: 1.6 10*3/uL (ref 0.7–4.0)
MCH: 30.4 pg (ref 26.0–34.0)
MCHC: 34.8 g/dL (ref 30.0–36.0)
MCV: 87.4 fL (ref 78.0–100.0)
Monocytes Relative: 10 % (ref 3–12)
Neutrophils Relative %: 56 % (ref 43–77)
Platelets: 153 10*3/uL (ref 150–400)
RBC: 4.77 MIL/uL (ref 4.22–5.81)

## 2012-02-28 LAB — CORTISOL
Cortisol, Plasma: 19.1 ug/dL
Cortisol, Plasma: 22.3 ug/dL

## 2012-02-28 LAB — BASIC METABOLIC PANEL
BUN: 14 mg/dL (ref 6–23)
CO2: 30 mEq/L (ref 19–32)
Calcium: 9 mg/dL (ref 8.4–10.5)
GFR calc non Af Amer: 70 mL/min — ABNORMAL LOW (ref >90)
Glucose, Bld: 125 mg/dL — ABNORMAL HIGH (ref 70–99)
Sodium: 144 mEq/L (ref 135–145)

## 2012-02-28 LAB — PROTIME-INR
INR: 1.12 (ref 0.00–1.49)
Prothrombin Time: 14.6 seconds (ref 11.6–15.2)

## 2012-02-28 MED ORDER — COSYNTROPIN 0.25 MG IJ SOLR
0.2500 mg | Freq: Once | INTRAMUSCULAR | Status: AC
  Administered 2012-02-28: 0.25 mg via INTRAVENOUS
  Filled 2012-02-28: qty 0.25

## 2012-02-28 MED ORDER — FENTANYL CITRATE 0.05 MG/ML IJ SOLN
INTRAMUSCULAR | Status: DC | PRN
  Administered 2012-02-28: 25 ug via INTRAVENOUS
  Administered 2012-02-28: 50 ug via INTRAVENOUS
  Administered 2012-02-28: 25 ug via INTRAVENOUS

## 2012-02-28 MED ORDER — HYDROCODONE-ACETAMINOPHEN 5-325 MG PO TABS: 1.0000 | ORAL_TABLET | ORAL | Status: DC | PRN

## 2012-02-28 MED ORDER — MIDAZOLAM HCL 2 MG/2ML IJ SOLN
INTRAMUSCULAR | Status: AC
  Filled 2012-02-28: qty 4

## 2012-02-28 MED ORDER — MIDAZOLAM HCL 5 MG/5ML IJ SOLN
INTRAMUSCULAR | Status: DC | PRN
  Administered 2012-02-28 (×2): 1 mg via INTRAVENOUS

## 2012-02-28 MED ORDER — ONDANSETRON HCL 4 MG/2ML IJ SOLN
INTRAMUSCULAR | Status: AC
  Filled 2012-02-28: qty 2

## 2012-02-28 MED ORDER — FENTANYL CITRATE 0.05 MG/ML IJ SOLN
INTRAMUSCULAR | Status: AC
  Filled 2012-02-28: qty 4

## 2012-02-28 MED ORDER — SODIUM CHLORIDE 0.9 % IV SOLN
Freq: Once | INTRAVENOUS | Status: AC
  Administered 2012-02-28: 1000 mL via INTRAVENOUS

## 2012-02-28 MED ORDER — HEPARIN SODIUM (PORCINE) 1000 UNIT/ML IJ SOLN
INTRAMUSCULAR | Status: AC
  Filled 2012-02-28: qty 1

## 2012-02-28 MED ORDER — PROMETHAZINE HCL 25 MG/ML IJ SOLN
12.5000 mg | Freq: Once | INTRAMUSCULAR | Status: AC
  Administered 2012-02-28: 12.5 mg via INTRAVENOUS
  Filled 2012-02-28: qty 1

## 2012-02-28 MED ORDER — ONDANSETRON HCL 4 MG/2ML IJ SOLN
4.0000 mg | Freq: Four times a day (QID) | INTRAMUSCULAR | Status: DC | PRN
  Administered 2012-02-28: 4 mg via INTRAVENOUS

## 2012-02-28 MED ORDER — HEPARIN SODIUM (PORCINE) 1000 UNIT/ML IJ SOLN
INTRAMUSCULAR | Status: DC | PRN
  Administered 2012-02-28: 7000 [IU] via INTRAVENOUS

## 2012-02-28 MED ORDER — IOHEXOL 300 MG/ML  SOLN
150.0000 mL | Freq: Once | INTRAMUSCULAR | Status: AC | PRN
  Administered 2012-02-28: 70 mL via INTRAVENOUS

## 2012-02-28 NOTE — H&P (Signed)
Marcus Crawford is an 61 y.o. male.   Chief Complaint: HTN; hypokalemia x 1 yr Hyperaldosteronism: CT 02/08/12: left adrenal nodule Scheduled now for adrenal vein sampling HPI: Diverticulitis; HTN  Past Medical History  Diagnosis Date  . Colonic polyp     X3 ,hyperplastic  . Diverticulosis of colon   . Hyperlipidemia   . Bell's palsy   . Migraine     Past Surgical History  Procedure Date  . Appendectomy   . Esophageal dilation 2001    Dr Corinda Gubler  . Colonoscopy     last 9/12, Dr Russella Dar  . Polypectomy      X 3    Family History  Problem Relation Age of Onset  . Prostate cancer Brother 34  . Heart failure Brother   . Hypertension Brother   . Lung cancer Sister     smoker  . Breast cancer Sister   . Kidney cancer Brother   . Colon cancer Mother 46  . Diabetes Sister   . Diabetes Brother   . Heart attack Mother 72  . Heart attack Brother 61   Social History:  reports that he has never smoked. He does not have any smokeless tobacco history on file. He reports that he drinks about 1.2 ounces of alcohol per week. He reports that he does not use illicit drugs.  Allergies:  Allergies  Allergen Reactions  . Penicillins     hives  . Sulfa Antibiotics Rash     (Not in a hospital admission)  No results found for this or any previous visit (from the past 48 hour(s)). No results found.  Review of Systems  Constitutional: Negative for fever and chills.  Respiratory: Negative for shortness of breath.   Cardiovascular: Negative for chest pain.  Gastrointestinal: Negative for nausea, vomiting and abdominal pain.  Neurological: Negative for headaches.    Blood pressure 162/85, pulse 49, temperature 97.2 F (36.2 C), temperature source Oral, resp. rate 18, height 6\' 1"  (1.854 m), weight 235 lb (106.595 kg), SpO2 94.00%. Physical Exam  Constitutional: He is oriented to person, place, and time. He appears well-developed and well-nourished.  HENT:       Rt side mouth sl  droop; Bells Palsy 1978  Cardiovascular: Normal rate, regular rhythm and normal heart sounds.   No murmur heard. Respiratory: Effort normal. He has no wheezes.  GI: Soft. Bowel sounds are normal. There is no tenderness.  Musculoskeletal: Normal range of motion.  Neurological: He is alert and oriented to person, place, and time.  Skin: Skin is warm and dry.  Psychiatric: He has a normal mood and affect. His behavior is normal. Judgment and thought content normal.     Assessment/Plan Hypokalemia x 1 yr HTN CT shows Left adrenal nodule Scheduled for adrenal vein sampling in IR Pt aware of procedure benefits and risks and agreeale to proceed. Consent signed and in chart  Marcus Crawford A 02/28/2012, 7:24 AM

## 2012-02-28 NOTE — H&P (Signed)
Agree with above.  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  Will proceed with adrenal venous sampling.   Signed,  Sterling Big, MD Vascular & Interventional Radiologist [facility] Radiology

## 2012-02-28 NOTE — Procedures (Signed)
Interventional Radiology Procedure Note  Procedure: Bilateral adrenal venous sampling Complications: None Recommendations: - Flat, legs straight x 2 hrs - Clears until able to sit upright than ADAT - Home in 3 hrs if no complications - Will follow sampling results  Signed,  Sterling Big, MD Vascular & Interventional Radiologist [facility]

## 2012-02-28 NOTE — ED Notes (Signed)
sats 99 R AIR

## 2012-02-28 NOTE — ED Notes (Signed)
ACTH given IV bolus per MD

## 2012-02-29 LAB — CORTISOL
Cortisol, Plasma: 311.7 ug/dL
Cortisol, Plasma: 1292.7 ug/dL

## 2012-03-02 HISTORY — PX: OTHER SURGICAL HISTORY: SHX169

## 2012-03-03 LAB — ALDOSTERONE
Aldosterone, Serum: 4 ng/dL
Aldosterone, Serum: 11913 ng/dL
Aldosterone, Serum: 4 ng/dL

## 2012-03-05 ENCOUNTER — Telehealth: Payer: Self-pay | Admitting: *Deleted

## 2012-03-05 ENCOUNTER — Telehealth: Payer: Self-pay

## 2012-03-05 MED ORDER — POTASSIUM CHLORIDE CRYS ER 20 MEQ PO TBCR: 20.0000 meq | EXTENDED_RELEASE_TABLET | Freq: Two times a day (BID) | ORAL | Status: DC

## 2012-03-05 NOTE — Telephone Encounter (Signed)
The next step is to hear back for interventional radiology.  They did venous sampling, and they will make a report showing the source of the hyperaldosteronism.  Today, i have increased the klor to bid.  i am looking forward to the report any day.

## 2012-03-05 NOTE — Telephone Encounter (Signed)
Marcus Crawford, apparently you're evaluating Marcus Crawford for hyperaldosteronism. The chart indicates he is taking 20 mEq of potassium twice a day, yet his potassium was 2.8 on 8/29. Can you advise me as to the present status of his evaluation and any recommendations for correcting the significant hypokalemia for which he was referred. Thanks very much, [employer]

## 2012-03-05 NOTE — Telephone Encounter (Signed)
Pt called concerned about level of potassium on last lab done 08/29. Pt is requesting SAE review and advise on medication adjustment of needed.

## 2012-03-05 NOTE — Telephone Encounter (Signed)
Pt calling inquiring about who needs to be monitoring his potassium level. Pt notes that he had this level checked 6 day ago and it was low at (2.8). Pt would like to know if he needs to adjust med. Pt also indicated that he was referred to Dr Everardo All for this concern and has contacted there office and was advise that Dr Everardo All in not treating him for this and he would need to contact Dr Alwyn Ren. Pt is request that both provider review info and determine what would be the appropriate treatment for his Potassium level.

## 2012-03-05 NOTE — Telephone Encounter (Signed)
Pt informed of new rx for potassium via VM and to callback office with any questions/concerns.

## 2012-03-05 NOTE — Telephone Encounter (Signed)
Increase to 1 pill, twice a day.  i have sent a prescription to your pharmacy

## 2012-03-06 NOTE — Telephone Encounter (Signed)
Pt made aware to contact Dr Everardo All office that Dr Alwyn Ren has advise him of the situation and he will be adjusting his med. Pt ok info and states that he will contact Dr Everardo All office for any further concern about his potassium.

## 2012-03-15 ENCOUNTER — Other Ambulatory Visit: Payer: Self-pay | Admitting: Endocrinology

## 2012-03-15 DIAGNOSIS — E2609 Other primary hyperaldosteronism: Secondary | ICD-10-CM

## 2012-03-18 ENCOUNTER — Telehealth: Payer: Self-pay | Admitting: Endocrinology

## 2012-03-18 NOTE — Telephone Encounter (Signed)
Patient is requesting a call back from Dr. Everardo All

## 2012-03-19 NOTE — Telephone Encounter (Signed)
i called pt.  i'll request a sooner appt with dr gerkin

## 2012-03-21 ENCOUNTER — Other Ambulatory Visit: Payer: Self-pay | Admitting: Endocrinology

## 2012-03-21 ENCOUNTER — Encounter: Payer: Self-pay | Admitting: Endocrinology

## 2012-03-21 DIAGNOSIS — E876 Hypokalemia: Secondary | ICD-10-CM

## 2012-03-24 ENCOUNTER — Other Ambulatory Visit (INDEPENDENT_AMBULATORY_CARE_PROVIDER_SITE_OTHER): Payer: BC Managed Care – PPO

## 2012-03-24 DIAGNOSIS — E876 Hypokalemia: Secondary | ICD-10-CM

## 2012-03-25 LAB — BASIC METABOLIC PANEL
BUN: 17 mg/dL (ref 6–23)
CO2: 32 mEq/L (ref 19–32)
Calcium: 8.9 mg/dL (ref 8.4–10.5)
GFR: 68.82 mL/min (ref >60.00)
Glucose, Bld: 117 mg/dL — ABNORMAL HIGH (ref 70–99)

## 2012-03-26 ENCOUNTER — Encounter: Payer: Self-pay | Admitting: Endocrinology

## 2012-03-27 ENCOUNTER — Ambulatory Visit (INDEPENDENT_AMBULATORY_CARE_PROVIDER_SITE_OTHER): Payer: BC Managed Care – PPO | Admitting: General Surgery

## 2012-03-27 ENCOUNTER — Encounter (INDEPENDENT_AMBULATORY_CARE_PROVIDER_SITE_OTHER): Payer: Self-pay | Admitting: General Surgery

## 2012-03-27 VITALS — BP 123/86 | HR 87 | Temp 98.2°F | Resp 14 | Ht 73.0 in | Wt 230.2 lb

## 2012-03-27 DIAGNOSIS — D3500 Benign neoplasm of unspecified adrenal gland: Secondary | ICD-10-CM | POA: Insufficient documentation

## 2012-03-27 MED ORDER — MAGNESIUM CITRATE PO SOLN: 1.0000 | Freq: Once | ORAL | Status: DC

## 2012-03-27 NOTE — Progress Notes (Signed)
Patient ID: Marcus Crawford, male   DOB: 10/10/1950, 61 y.o.   MRN: [id number]  Chief Complaint  Patient presents with  . Other    HPI Marcus Crawford is a 61 y.o. male pleasant gentleman  was referred by Dr. William Hopper. The patient began having symptoms approximately since March of this year had been diagnosed with left renal nodule. Patient had been treated for hypertension in the past was noticed to have hypokalemia. Patient underwent battery of tests which revealed a left adrenal aldosteronoma. Patient underwent most recently adrenal vein sampling was isolated the left adrenal as a source of aldosteronoma. Patient is currently taking supplementation for his potassium and otherwise has no symptomatic complaints. Patient does not describe any fever or chills or weight loss at this time. HPI  Past Medical History  Diagnosis Date  . Colonic polyp     X3 ,hyperplastic  . Diverticulosis of colon   . Hyperlipidemia   . Bell's palsy   . Migraine     Past Surgical History  Procedure Date  . Appendectomy   . Esophageal dilation 2001    Dr Pattison  . Colonoscopy     last 9/12, Dr Stark  . Polypectomy      X 3    Family History  Problem Relation Age of Onset  . Prostate cancer Brother 68  . Heart failure Brother   . Hypertension Brother   . Lung cancer Sister     smoker  . Breast cancer Sister   . Kidney cancer Brother   . Colon cancer Mother 75  . Heart attack Mother 88  . Diabetes Sister   . Diabetes Brother   . Heart attack Brother 78    Social History History  Substance Use Topics  . Smoking status: Never Smoker   . Smokeless tobacco: Not on file  . Alcohol Use: 1.2 oz/week    2 Cans of beer per week    Allergies  Allergen Reactions  . Penicillins     hives  . Sulfa Antibiotics Rash    Current Outpatient Prescriptions  Medication Sig Dispense Refill  . aspirin 81 MG tablet Take 81 mg by mouth daily.      . benazepril (LOTENSIN) 40 MG tablet Take 0.5  tablets (20 mg total) by mouth daily.  90 tablet  3  . diltiazem (MATZIM LA) 240 MG 24 hr tablet Take 1 tablet (240 mg total) by mouth daily.  90 tablet  3  . Multiple Vitamins-Minerals (MULTIVITAMIN WITH MINERALS) tablet Take 1 tablet by mouth daily.        . potassium chloride SA (KLOR-CON M20) 20 MEQ tablet Take 1 tablet (20 mEq total) by mouth 2 (two) times daily.  60 tablet  5  . RABEprazole (ACIPHEX) 20 MG tablet Take 1 tablet (20 mg total) by mouth daily.  30 tablet  11    Review of Systems Review of Systems  Blood pressure 123/86, pulse 87, temperature 98.2 F (36.8 C), temperature source Temporal, resp. rate 14, height 6' 1" (1.854 m), weight 230 lb 3.2 oz (104.418 kg).  Physical Exam Physical Exam  Data Reviewed CT scan of the abdomen and pelvis revealed a left adrenal nodule.  Adrenal vein sampling and  Assessment    The patient is a 61-year-old male with a left ventricle nodule consistent with aldosteronoma    Plan    1. Will schedule patient for a laparoscopic left adrenal excision. 2.All risks and benefits were discussed with   the patient, to generally include infection, bleeding, damage to surrounding structures, and recurrence. Alternatives were offered and described.  All questions were answered and the patient voiced understanding of the procedure and wishes to proceed at this point.        Emmitte Surgeon Jr., Jehan Bonano 03/27/2012, 9:39 AM    

## 2012-03-28 ENCOUNTER — Other Ambulatory Visit (INDEPENDENT_AMBULATORY_CARE_PROVIDER_SITE_OTHER): Payer: Self-pay | Admitting: General Surgery

## 2012-03-28 DIAGNOSIS — E270 Other adrenocortical overactivity: Secondary | ICD-10-CM

## 2012-03-28 MED ORDER — SPIRONOLACTONE 100 MG PO TABS: 100.0000 mg | ORAL_TABLET | Freq: Every day | ORAL | Status: DC

## 2012-04-01 ENCOUNTER — Encounter: Payer: Self-pay | Admitting: Endocrinology

## 2012-04-02 LAB — BASIC METABOLIC PANEL
BUN: 22 mg/dL (ref 6–23)
CO2: 26 mEq/L (ref 19–32)
Calcium: 9.6 mg/dL (ref 8.4–10.5)
Creat: 1.38 mg/dL — ABNORMAL HIGH (ref 0.50–1.35)

## 2012-04-03 ENCOUNTER — Telehealth (INDEPENDENT_AMBULATORY_CARE_PROVIDER_SITE_OTHER): Payer: Self-pay | Admitting: General Surgery

## 2012-04-03 NOTE — Telephone Encounter (Signed)
Patient made aware per Dr Derrell Lolling that his lab work drawn 04/02/12 looks okay. He will call back if needed.

## 2012-04-09 ENCOUNTER — Encounter (HOSPITAL_COMMUNITY): Payer: Self-pay | Admitting: Pharmacy Technician

## 2012-04-10 ENCOUNTER — Telehealth (INDEPENDENT_AMBULATORY_CARE_PROVIDER_SITE_OTHER): Payer: Self-pay

## 2012-04-10 ENCOUNTER — Encounter (HOSPITAL_COMMUNITY): Payer: Self-pay | Admitting: *Deleted

## 2012-04-10 NOTE — Telephone Encounter (Signed)
The patient called to clarify if he needs to get Mag Citrate.  It is on his AVS.  Please let him know.  Surgery is 10/14.  You can reach him on his cell too.

## 2012-04-10 NOTE — Progress Notes (Signed)
04-10-12 1245 Instructed as SDS labs. Instructed on Hibiclens shower as "preparing for surgery" guidelines. Will have responsible driver and person x 24 hrs. Once discharged.W. Kennon Portela

## 2012-04-10 NOTE — Telephone Encounter (Signed)
Called pt back and told him yes on the Mag Citrate

## 2012-04-14 ENCOUNTER — Encounter (HOSPITAL_COMMUNITY): Payer: Self-pay

## 2012-04-14 ENCOUNTER — Encounter (HOSPITAL_COMMUNITY): Payer: Self-pay | Admitting: *Deleted

## 2012-04-14 ENCOUNTER — Encounter (HOSPITAL_COMMUNITY)
Admission: RE | Disposition: A | Discharge status: Home / Self Care | Payer: Self-pay | Source: Ambulatory Visit | Attending: General Surgery

## 2012-04-14 ENCOUNTER — Encounter (HOSPITAL_COMMUNITY): Payer: Self-pay | Admitting: Certified Registered Nurse Anesthetist

## 2012-04-14 ENCOUNTER — Observation Stay (HOSPITAL_COMMUNITY)
Admission: RE | Admit: 2012-04-14 | Discharge: 2012-04-16 | Disposition: A | Discharge status: Home / Self Care | Payer: BC Managed Care – PPO | Source: Ambulatory Visit | Attending: General Surgery | Admitting: General Surgery

## 2012-04-14 ENCOUNTER — Ambulatory Visit (HOSPITAL_COMMUNITY): Payer: BC Managed Care – PPO | Admitting: Certified Registered Nurse Anesthetist

## 2012-04-14 ENCOUNTER — Ambulatory Visit (HOSPITAL_COMMUNITY): Payer: BC Managed Care – PPO

## 2012-04-14 DIAGNOSIS — D3500 Benign neoplasm of unspecified adrenal gland: Principal | ICD-10-CM | POA: Insufficient documentation

## 2012-04-14 DIAGNOSIS — K219 Gastro-esophageal reflux disease without esophagitis: Secondary | ICD-10-CM

## 2012-04-14 DIAGNOSIS — I10 Essential (primary) hypertension: Secondary | ICD-10-CM | POA: Insufficient documentation

## 2012-04-14 DIAGNOSIS — R112 Nausea with vomiting, unspecified: Secondary | ICD-10-CM | POA: Insufficient documentation

## 2012-04-14 DIAGNOSIS — E785 Hyperlipidemia, unspecified: Secondary | ICD-10-CM | POA: Insufficient documentation

## 2012-04-14 DIAGNOSIS — Z79899 Other long term (current) drug therapy: Secondary | ICD-10-CM | POA: Insufficient documentation

## 2012-04-14 HISTORY — DX: Other specified postprocedural states: R11.2

## 2012-04-14 HISTORY — DX: Gastro-esophageal reflux disease without esophagitis: K21.9

## 2012-04-14 HISTORY — DX: Other specified postprocedural states: Z98.890

## 2012-04-14 HISTORY — DX: Personal history of other diseases of the digestive system: Z87.19

## 2012-04-14 HISTORY — PX: ADRENALECTOMY: SHX876

## 2012-04-14 HISTORY — DX: Essential (primary) hypertension: I10

## 2012-04-14 HISTORY — DX: Acute embolism and thrombosis of unspecified vein: I82.90

## 2012-04-14 LAB — CBC
HCT: 42.6 % (ref 39.0–52.0)
HCT: 41.7 % (ref 39.0–52.0)
Hemoglobin: 15 g/dL (ref 13.0–17.0)
MCH: 30.6 pg (ref 26.0–34.0)
MCH: 30 pg (ref 26.0–34.0)
MCHC: 35.2 g/dL (ref 30.0–36.0)
MCHC: 34.1 g/dL (ref 30.0–36.0)
MCV: 86.9 fL (ref 78.0–100.0)
MCV: 88 fL (ref 78.0–100.0)
RBC: 4.9 MIL/uL (ref 4.22–5.81)
RDW: 12.5 % (ref 11.5–15.5)

## 2012-04-14 LAB — BASIC METABOLIC PANEL
BUN: 21 mg/dL (ref 6–23)
CO2: 25 mEq/L (ref 19–32)
Chloride: 105 mEq/L (ref 96–112)
Creatinine, Ser: 1.52 mg/dL — ABNORMAL HIGH (ref 0.50–1.35)
Glucose, Bld: 154 mg/dL — ABNORMAL HIGH (ref 70–99)

## 2012-04-14 LAB — SURGICAL PCR SCREEN: MRSA, PCR: NEGATIVE

## 2012-04-14 SURGERY — ADRENALECTOMY
Anesthesia: General | Laterality: Left | Wound class: Clean

## 2012-04-14 MED ORDER — VANCOMYCIN HCL 1000 MG IV SOLR
1500.0000 mg | INTRAVENOUS | Status: AC
  Administered 2012-04-14: 1500 mg via INTRAVENOUS
  Filled 2012-04-14: qty 1500

## 2012-04-14 MED ORDER — FENTANYL CITRATE 0.05 MG/ML IJ SOLN
INTRAMUSCULAR | Status: DC | PRN
  Administered 2012-04-14: 150 ug via INTRAVENOUS
  Administered 2012-04-14: 100 ug via INTRAVENOUS

## 2012-04-14 MED ORDER — HYDRALAZINE HCL 20 MG/ML IJ SOLN
INTRAMUSCULAR | Status: DC | PRN
  Administered 2012-04-14: 5 mg via INTRAVENOUS

## 2012-04-14 MED ORDER — NEOSTIGMINE METHYLSULFATE 1 MG/ML IJ SOLN
INTRAMUSCULAR | Status: DC | PRN
  Administered 2012-04-14: 2.5 mg via INTRAVENOUS

## 2012-04-14 MED ORDER — ONDANSETRON HCL 4 MG/2ML IJ SOLN
INTRAMUSCULAR | Status: DC | PRN
  Administered 2012-04-14: 4 mg via INTRAVENOUS

## 2012-04-14 MED ORDER — CISATRACURIUM BESYLATE (PF) 10 MG/5ML IV SOLN
INTRAVENOUS | Status: DC | PRN
  Administered 2012-04-14: 8 mg via INTRAVENOUS
  Administered 2012-04-14 (×2): 4 mg via INTRAVENOUS

## 2012-04-14 MED ORDER — DIPHENHYDRAMINE HCL 50 MG/ML IJ SOLN
12.5000 mg | Freq: Four times a day (QID) | INTRAMUSCULAR | Status: DC | PRN
Start: 2012-04-14 — End: 2012-04-16

## 2012-04-14 MED ORDER — GLYCOPYRROLATE 0.2 MG/ML IJ SOLN
INTRAMUSCULAR | Status: DC | PRN
  Administered 2012-04-14: .5 mg via INTRAVENOUS

## 2012-04-14 MED ORDER — MIDAZOLAM HCL 5 MG/5ML IJ SOLN
INTRAMUSCULAR | Status: DC | PRN
  Administered 2012-04-14: 2 mg via INTRAVENOUS

## 2012-04-14 MED ORDER — LIDOCAINE HCL (CARDIAC) 20 MG/ML IV SOLN
INTRAVENOUS | Status: DC | PRN
  Administered 2012-04-14: 50 mg via INTRAVENOUS

## 2012-04-14 MED ORDER — LACTATED RINGERS IV SOLN
INTRAVENOUS | Status: DC
  Administered 2012-04-14: 1000 mL via INTRAVENOUS

## 2012-04-14 MED ORDER — CHLORHEXIDINE GLUCONATE 4 % EX LIQD
1.0000 "application " | Freq: Once | CUTANEOUS | Status: DC
  Filled 2012-04-14: qty 15

## 2012-04-14 MED ORDER — HYDROCODONE-ACETAMINOPHEN 5-325 MG PO TABS
1.0000 | ORAL_TABLET | ORAL | Status: DC | PRN
Start: 2012-04-14 — End: 2012-04-16
  Administered 2012-04-15 – 2012-04-16 (×2): 1 via ORAL
  Filled 2012-04-14: qty 1
  Filled 2012-04-14: qty 2
  Filled 2012-04-14: qty 1

## 2012-04-14 MED ORDER — SCOPOLAMINE 1 MG/3DAYS TD PT72
1.0000 | MEDICATED_PATCH | Freq: Once | TRANSDERMAL | Status: DC
  Administered 2012-04-14: 1.5 mg via TRANSDERMAL
  Filled 2012-04-14: qty 1

## 2012-04-14 MED ORDER — 0.9 % SODIUM CHLORIDE (POUR BTL) OPTIME
TOPICAL | Status: DC | PRN
  Administered 2012-04-14: 2000 mL

## 2012-04-14 MED ORDER — DIPHENHYDRAMINE HCL 12.5 MG/5ML PO ELIX: 12.5000 mg | ORAL_SOLUTION | Freq: Four times a day (QID) | ORAL | Status: DC | PRN

## 2012-04-14 MED ORDER — BUPIVACAINE HCL 0.25 % IJ SOLN
INTRAMUSCULAR | Status: DC | PRN
  Administered 2012-04-14: 30 mL

## 2012-04-14 MED ORDER — SCOPOLAMINE 1 MG/3DAYS TD PT72
MEDICATED_PATCH | TRANSDERMAL | Status: DC | PRN
  Administered 2012-04-14: 1 via TRANSDERMAL

## 2012-04-14 MED ORDER — PROMETHAZINE HCL 25 MG/ML IJ SOLN: 6.2500 mg | INTRAMUSCULAR | Status: DC | PRN

## 2012-04-14 MED ORDER — HYDROMORPHONE HCL PF 1 MG/ML IJ SOLN
INTRAMUSCULAR | Status: DC | PRN
  Administered 2012-04-14 (×2): 1 mg via INTRAVENOUS

## 2012-04-14 MED ORDER — SCOPOLAMINE 1 MG/3DAYS TD PT72
MEDICATED_PATCH | TRANSDERMAL | Status: AC
  Filled 2012-04-14: qty 1

## 2012-04-14 MED ORDER — ONDANSETRON HCL 4 MG/2ML IJ SOLN
4.0000 mg | Freq: Four times a day (QID) | INTRAMUSCULAR | Status: DC | PRN
  Administered 2012-04-14 – 2012-04-15 (×2): 4 mg via INTRAVENOUS
  Filled 2012-04-14 (×2): qty 2

## 2012-04-14 MED ORDER — HYDROMORPHONE HCL PF 1 MG/ML IJ SOLN
INTRAMUSCULAR | Status: AC
  Filled 2012-04-14: qty 1

## 2012-04-14 MED ORDER — ACETAMINOPHEN 10 MG/ML IV SOLN
INTRAVENOUS | Status: AC
  Filled 2012-04-14: qty 100

## 2012-04-14 MED ORDER — PROPOFOL 10 MG/ML IV BOLUS
INTRAVENOUS | Status: DC | PRN
  Administered 2012-04-14: 200 mg via INTRAVENOUS

## 2012-04-14 MED ORDER — LACTATED RINGERS IV SOLN
INTRAVENOUS | Status: DC | PRN
  Administered 2012-04-14: 10:00:00 via INTRAVENOUS

## 2012-04-14 MED ORDER — HYDROMORPHONE HCL PF 1 MG/ML IJ SOLN
1.0000 mg | INTRAMUSCULAR | Status: DC | PRN
  Administered 2012-04-14 – 2012-04-15 (×2): 1 mg via INTRAVENOUS
  Filled 2012-04-14 (×3): qty 1

## 2012-04-14 MED ORDER — BUPIVACAINE HCL (PF) 0.25 % IJ SOLN
INTRAMUSCULAR | Status: AC
  Filled 2012-04-14: qty 30

## 2012-04-14 MED ORDER — STERILE WATER FOR IRRIGATION IR SOLN
Status: DC | PRN
  Administered 2012-04-14: 1500 mL

## 2012-04-14 MED ORDER — MUPIROCIN 2 % EX OINT
TOPICAL_OINTMENT | Freq: Two times a day (BID) | CUTANEOUS | Status: DC
  Filled 2012-04-14: qty 22

## 2012-04-14 MED ORDER — SUCCINYLCHOLINE CHLORIDE 20 MG/ML IJ SOLN
INTRAMUSCULAR | Status: DC | PRN
  Administered 2012-04-14: 100 mg via INTRAVENOUS

## 2012-04-14 MED ORDER — PANTOPRAZOLE SODIUM 40 MG PO TBEC
40.0000 mg | DELAYED_RELEASE_TABLET | Freq: Every day | ORAL | Status: DC
  Administered 2012-04-15 – 2012-04-16 (×2): 40 mg via ORAL
  Filled 2012-04-14 (×3): qty 1

## 2012-04-14 MED ORDER — HYDROMORPHONE HCL PF 1 MG/ML IJ SOLN
0.2500 mg | INTRAMUSCULAR | Status: DC | PRN
  Administered 2012-04-14: 0.5 mg via INTRAVENOUS

## 2012-04-14 MED ORDER — ACETAMINOPHEN 10 MG/ML IV SOLN
INTRAVENOUS | Status: DC | PRN
  Administered 2012-04-14: 1000 mg via INTRAVENOUS

## 2012-04-14 MED ORDER — DEXTROSE-NACL 5-0.9 % IV SOLN
INTRAVENOUS | Status: DC
  Administered 2012-04-14 – 2012-04-15 (×3): via INTRAVENOUS

## 2012-04-14 SURGICAL SUPPLY — 66 items
APPLICATOR COTTON TIP 6IN STRL (MISCELLANEOUS) ×2 IMPLANT
BAG SPEC RTRVL LRG 6X4 10 (ENDOMECHANICALS) ×1
BLADE EXTENDED COATED 6.5IN (ELECTRODE) IMPLANT
BLADE HEX COATED 2.75 (ELECTRODE) ×2 IMPLANT
BLADE SURG SZ10 CARB STEEL (BLADE) IMPLANT
CABLE HIGH FREQUENCY MONO STRZ (ELECTRODE) ×1 IMPLANT
CANISTER SUCTION 2500CC (MISCELLANEOUS) ×2 IMPLANT
CLIP LIGATING HEM O LOK PURPLE (MISCELLANEOUS) ×1 IMPLANT
CLIP TI LARGE 6 (CLIP) IMPLANT
CLOTH BEACON ORANGE TIMEOUT ST (SAFETY) ×2 IMPLANT
COVER MAYO STAND STRL (DRAPES) ×1 IMPLANT
DECANTER SPIKE VIAL GLASS SM (MISCELLANEOUS) ×1 IMPLANT
DEVICE TROCAR PUNCTURE CLOSURE (ENDOMECHANICALS) ×1 IMPLANT
DRAIN CHANNEL 10F 3/8 F FF (DRAIN) IMPLANT
DRAPE INCISE IOBAN 66X45 STRL (DRAPES) ×1 IMPLANT
DRAPE LAPAROSCOPIC ABDOMINAL (DRAPES) ×2 IMPLANT
DRAPE LG THREE QUARTER DISP (DRAPES) IMPLANT
DRAPE WARM FLUID 44X44 (DRAPE) ×2 IMPLANT
DRSG PAD ABDOMINAL 8X10 ST (GAUZE/BANDAGES/DRESSINGS) IMPLANT
ELECT REM PT RETURN 9FT ADLT (ELECTROSURGICAL) ×2
ELECTRODE REM PT RTRN 9FT ADLT (ELECTROSURGICAL) ×1 IMPLANT
EVACUATOR DRAINAGE 10X20 100CC (DRAIN) IMPLANT
EVACUATOR SILICONE 100CC (DRAIN) IMPLANT
GAUZE SPONGE 4X4 16PLY XRAY LF (GAUZE/BANDAGES/DRESSINGS) IMPLANT
GLOVE BIOGEL PI IND STRL 7.0 (GLOVE) ×1 IMPLANT
GLOVE BIOGEL PI INDICATOR 7.0 (GLOVE) ×1
GOWN STRL NON-REIN LRG LVL3 (GOWN DISPOSABLE) ×2 IMPLANT
GOWN STRL REIN XL XLG (GOWN DISPOSABLE) ×4 IMPLANT
HAND ACTIVATED (MISCELLANEOUS) IMPLANT
KIT BASIN OR (CUSTOM PROCEDURE TRAY) ×2 IMPLANT
NDL INSUFFLATION 14GA 120MM (NEEDLE) IMPLANT
NEEDLE INSUFFLATION 14GA 120MM (NEEDLE) ×2 IMPLANT
NS IRRIG 1000ML POUR BTL (IV SOLUTION) ×2 IMPLANT
PACK GENERAL/GYN (CUSTOM PROCEDURE TRAY) ×1 IMPLANT
PAD TELFA 2X3 NADH STRL (GAUZE/BANDAGES/DRESSINGS) IMPLANT
POUCH SPECIMEN RETRIEVAL 10MM (ENDOMECHANICALS) ×1 IMPLANT
SCALPEL HARMONIC ACE (MISCELLANEOUS) ×1 IMPLANT
SPONGE GAUZE 4X4 12PLY (GAUZE/BANDAGES/DRESSINGS) ×1 IMPLANT
STAPLER VISISTAT 35W (STAPLE) ×1 IMPLANT
SUCTION POOLE TIP (SUCTIONS) ×1 IMPLANT
SUT CHROMIC 0 SH (SUTURE) IMPLANT
SUT CHROMIC 2 0 TIES 18 (SUTURE) IMPLANT
SUT MNCRL AB 4-0 PS2 18 (SUTURE) ×2 IMPLANT
SUT NOV 1 T60/GS (SUTURE) IMPLANT
SUT NOVA NAB DX-16 0-1 5-0 T12 (SUTURE) IMPLANT
SUT NOVA T20/GS 25 (SUTURE) IMPLANT
SUT PDS AB 1 CTX 36 (SUTURE) ×2 IMPLANT
SUT SILK 2 0 (SUTURE)
SUT SILK 2 0 SH CR/8 (SUTURE) IMPLANT
SUT SILK 2 0SH CR/8 30 (SUTURE) IMPLANT
SUT SILK 2-0 18XBRD TIE 12 (SUTURE) IMPLANT
SUT SILK 2-0 30XBRD TIE 12 (SUTURE) IMPLANT
SUT SILK 3 0 (SUTURE)
SUT SILK 3 0 SH CR/8 (SUTURE) IMPLANT
SUT SILK 3-0 18XBRD TIE 12 (SUTURE) IMPLANT
SUT VIC AB 1 CTX 36 (SUTURE) ×4
SUT VIC AB 1 CTX36XBRD ANBCTR (SUTURE) IMPLANT
SUT VIC AB 2-0 SH 18 (SUTURE) IMPLANT
SUT VIC AB 3-0 SH 18 (SUTURE) IMPLANT
SUT VICRYL 2 0 18  UND BR (SUTURE)
SUT VICRYL 2 0 18 UND BR (SUTURE) ×2 IMPLANT
TOWEL OR 17X26 10 PK STRL BLUE (TOWEL DISPOSABLE) ×4 IMPLANT
TRAY FOLEY CATH 14FRSI W/METER (CATHETERS) ×2 IMPLANT
TROCAR BLADELESS OPT 5 100 (ENDOMECHANICALS) ×3 IMPLANT
TROCAR XCEL NON-BLD 11X100MML (ENDOMECHANICALS) ×1 IMPLANT
YANKAUER SUCT BULB TIP NO VENT (SUCTIONS) ×1 IMPLANT

## 2012-04-14 NOTE — Anesthesia Preprocedure Evaluation (Signed)
Anesthesia Evaluation  Patient identified by MRN, date of birth, ID band Patient awake    Reviewed: Allergy & Precautions, H&P , NPO status , Patient's Chart, lab work & pertinent test results  History of Anesthesia Complications (+) PONV  Airway Mallampati: II TM Distance: >3 FB Neck ROM: Full    Dental No notable dental hx.    Pulmonary neg pulmonary ROS,  breath sounds clear to auscultation  Pulmonary exam normal       Cardiovascular hypertension, Pt. on medications Rhythm:Regular Rate:Normal     Neuro/Psych  Headaches, negative psych ROS   GI/Hepatic Neg liver ROS, hiatal hernia, GERD-  Medicated,  Endo/Other  negative endocrine ROS  Renal/GU Renal diseaseHypokalemia. Primary hyperaldosteronism, adrenal adenoma. Cr 1.38  negative genitourinary   Musculoskeletal negative musculoskeletal ROS (+)   Abdominal   Peds negative pediatric ROS (+)  Hematology negative hematology ROS (+)   Anesthesia Other Findings   Reproductive/Obstetrics negative OB ROS                           Anesthesia Physical Anesthesia Plan  ASA: II  Anesthesia Plan: General   Post-op Pain Management:    Induction: Intravenous  Airway Management Planned: Oral ETT  Additional Equipment:   Intra-op Plan:   Post-operative Plan: Extubation in OR  Informed Consent: I have reviewed the patients History and Physical, chart, labs and discussed the procedure including the risks, benefits and alternatives for the proposed anesthesia with the patient or authorized representative who has indicated his/her understanding and acceptance.   Dental advisory given  Plan Discussed with: CRNA  Anesthesia Plan Comments:         Anesthesia Quick Evaluation

## 2012-04-14 NOTE — Transfer of Care (Signed)
Immediate Anesthesia Transfer of Care Note  Patient: Marcus Crawford  Procedure(s) Performed: Procedure(s) (LRB) with comments: ADRENALECTOMY (Left) - Left Adrenal Excision  Patient Location: PACU  Anesthesia Type: General  Level of Consciousness: awake, alert  and oriented  Airway & Oxygen Therapy: Patient Spontanous Breathing and Patient connected to face mask oxygen  Post-op Assessment: Report given to PACU RN  Post vital signs: Reviewed and stable  Complications: No apparent anesthesia complications

## 2012-04-14 NOTE — Anesthesia Postprocedure Evaluation (Signed)
  Anesthesia Post-op Note  Patient: Marcus Crawford  Procedure(s) Performed: Procedure(s) (LRB): ADRENALECTOMY (Left)  Patient Location: PACU  Anesthesia Type: General  Level of Consciousness: awake and alert   Airway and Oxygen Therapy: Patient Spontanous Breathing  Post-op Pain: mild  Post-op Assessment: Post-op Vital signs reviewed, Patient's Cardiovascular Status Stable, Respiratory Function Stable, Patent Airway and No signs of Nausea or vomiting  Post-op Vital Signs: stable  Complications: No apparent anesthesia complications

## 2012-04-14 NOTE — Interval H&P Note (Signed)
History and Physical Interval Note:  04/14/2012 9:52 AM  Marcus Crawford  has presented today for surgery, with the diagnosis of left adrenal excision  The various methods of treatment have been discussed with the patient and family. After consideration of risks, benefits and other options for treatment, the patient has consented to  Procedure(s) (LRB) with comments: ADRENALECTOMY (Left) - Left Adrenal Excision as a surgical intervention .  The patient's history has been reviewed, patient examined, no change in status, stable for surgery.  I have reviewed the patient's chart and labs.  Questions were answered to the patient's satisfaction.     [name]  The patient states that he has had no changes since being seen in clinic.  Axel Filler, MD [facility] General & Minimally Invasive Surgery Trauma & Emergency Surgery

## 2012-04-14 NOTE — Anesthesia Procedure Notes (Signed)
Procedure Name: Intubation Date/Time: 04/14/2012 11:08 AM Performed by: Hulan Fess Pre-anesthesia Checklist: Patient identified, Emergency Drugs available, Suction available, Patient being monitored and Timeout performed Patient Re-evaluated:Patient Re-evaluated prior to inductionOxygen Delivery Method: Circle system utilized Preoxygenation: Pre-oxygenation with 100% oxygen Intubation Type: IV induction Ventilation: Mask ventilation without difficulty Laryngoscope Size: Mac and 3 Grade View: Grade II Tube type: Oral Number of attempts: 1 Placement Confirmation: ETT inserted through vocal cords under direct vision Secured at: 24 cm Tube secured with: Tape Dental Injury: Teeth and Oropharynx as per pre-operative assessment

## 2012-04-14 NOTE — H&P (View-Only) (Signed)
Patient ID: Marcus Crawford, male   DOB: [birth year], 61 y.o.   MRN: [id number]  Chief Complaint  Patient presents with  . Other    HPI Marcus Crawford is a 61 y.o. male pleasant gentleman  was referred by Dr. Marga Melnick. The patient began having symptoms approximately since March of this year had been diagnosed with left renal nodule. Patient had been treated for hypertension in the past was noticed to have hypokalemia. Patient underwent battery of tests which revealed a left adrenal aldosteronoma. Patient underwent most recently adrenal vein sampling was isolated the left adrenal as a source of aldosteronoma. Patient is currently taking supplementation for his potassium and otherwise has no symptomatic complaints. Patient does not describe any fever or chills or weight loss at this time. HPI  Past Medical History  Diagnosis Date  . Colonic polyp     X3 ,hyperplastic  . Diverticulosis of colon   . Hyperlipidemia   . Bell's palsy   . Migraine     Past Surgical History  Procedure Date  . Appendectomy   . Esophageal dilation 2001    Dr Corinda Gubler  . Colonoscopy     last 9/12, Dr Russella Dar  . Polypectomy      X 3    Family History  Problem Relation Age of Onset  . Prostate cancer Brother 16  . Heart failure Brother   . Hypertension Brother   . Lung cancer Sister     smoker  . Breast cancer Sister   . Kidney cancer Brother   . Colon cancer Mother 53  . Heart attack Mother 1  . Diabetes Sister   . Diabetes Brother   . Heart attack Brother 57    Social History History  Substance Use Topics  . Smoking status: Never Smoker   . Smokeless tobacco: Not on file  . Alcohol Use: 1.2 oz/week    2 Cans of beer per week    Allergies  Allergen Reactions  . Penicillins     hives  . Sulfa Antibiotics Rash    Current Outpatient Prescriptions  Medication Sig Dispense Refill  . aspirin 81 MG tablet Take 81 mg by mouth daily.      . benazepril (LOTENSIN) 40 MG tablet Take 0.5  tablets (20 mg total) by mouth daily.  90 tablet  3  . diltiazem (MATZIM LA) 240 MG 24 hr tablet Take 1 tablet (240 mg total) by mouth daily.  90 tablet  3  . Multiple Vitamins-Minerals (MULTIVITAMIN WITH MINERALS) tablet Take 1 tablet by mouth daily.        . potassium chloride SA (KLOR-CON M20) 20 MEQ tablet Take 1 tablet (20 mEq total) by mouth 2 (two) times daily.  60 tablet  5  . RABEprazole (ACIPHEX) 20 MG tablet Take 1 tablet (20 mg total) by mouth daily.  30 tablet  11    Review of Systems Review of Systems  Blood pressure 123/86, pulse 87, temperature 98.2 F (36.8 C), temperature source Temporal, resp. rate 14, height 6\' 1"  (1.854 m), weight 230 lb 3.2 oz (104.418 kg).  Physical Exam Physical Exam  Data Reviewed CT scan of the abdomen and pelvis revealed a left adrenal nodule.  Adrenal vein sampling and  Assessment    The patient is a 61 year old male with a left ventricle nodule consistent with aldosteronoma    Plan    1. Will schedule patient for a laparoscopic left adrenal excision. 2.All risks and benefits were discussed with  the patient, to generally include infection, bleeding, damage to surrounding structures, and recurrence. Alternatives were offered and described.  All questions were answered and the patient voiced understanding of the procedure and wishes to proceed at this point.        Marcus Crawford., Marcus Crawford 03/27/2012, 9:39 AM

## 2012-04-14 NOTE — Op Note (Signed)
Pre Operative Diagnosis:  Left adrenal adenoma  Post Operative Diagnosis: left adrenal adenoma  Procedure: Laparoscopic left adrenalectomy with splenic flexure mobilization and retroperitoneal splenic and pancreatic tail mobilization  Surgeon: Dr. Axel Filler  Assistant: Dr. gross  Anesthesia: GETA  EBL: 25 cc  Complications: none  Counts: reported as correct x 2  Findings:  Left adrenal adenoma with some adhesions of the L colon spelnic flexure  Indications for procedure:  The patient is a 61 year old male with a long-standing history of hypertension and hypokalemia. Patient underwent testing for aldosteronoma is positive for a left adrenal adenoma/aldosteronoma per IR. The patient was subsequently seen in clinic and elected to have an elective left adrenalectomy  Details of the procedure: the patient is a 61 year old male cemented the operating room and placed in the left decubitus position. Patient was prepped and draped in the usual sterile fashion. A timeout was called all facts were verified.  Procedure began with explaining the abdomen via a Veress needle technique in the left midclavicular line. Once this is accomplished apneumoperitoneum of 14 mm of mercury was obtained a 5 mm trocar was then placed intra-peritoneal.  There was no injury to any intra-abdominal organs. A second third and trocar were then placed along the subcostal margin and anterior axillary line and medial to the midclavicular line. At this time we visualized the splenic flexure the colon and adhesions to the lateral abdominal wall. These were incised with Bovie cautery to maintain hemostasis. Once this was done the splenic flexure and follow away from the lateral abdominal wall. We then carried dissection up laterally and cephalad. The lateral attachments of the spleen. We were able to dissect the leino-renal ligament cephalad until the tail of the pancreas and the spleen fell away medially.  We carried his  avascular dissection plane cephalad towards the diaphragm reflecting the spleen tail of pancreas and stomach medially. The Gerota's fascia of the superior pole of the kidney was incised and the adrenal gland was identified.  Real identified the adrenal vein on the medial aspect of the  Medial inferior aspect of the gland was dissected. The adrenal gland was identified going to the renal vein circumferentially dissected. 2 clips were then placed distally and one proximally and the adrenal vein was then transected. At this time using the Harmonic scalpel we elevated the adrenal gland off the retroperitoneum and took a attachments to the adrenal gland at this time. We circumferentially freed up the gland was free of its retroperitoneal attachments. A Endo Catch bag was then placed in the abdomen after a 10 mm port was placed in the Endo Catch bag. The adrenal bed was then visualized hemostasis. There were a few areas in the retroperitoneum and diaphragm that recauterize an area was irrigated out and checked for hemostasis. Hemostasis was excellent at this point the case. We then removed the Endo Catch bag and adrenal gland it's entirety. 2 more Vicryl is then used to reapproximate the 10 mm port site with a #1 Vicryl the and Endo Close. All ports were then removed and direct visualization. The skin was reapproximated using 4 Monocryl in a subcuticular fashion. The skin was then dressed with benzoin and Steri-Strips tape and gauze. The patient was awakened from general anesthesia was taken to recovery in stable condition.

## 2012-04-15 ENCOUNTER — Encounter (HOSPITAL_COMMUNITY): Payer: Self-pay | Admitting: General Surgery

## 2012-04-15 LAB — BASIC METABOLIC PANEL
BUN: 14 mg/dL (ref 6–23)
Calcium: 8.4 mg/dL (ref 8.4–10.5)
Chloride: 103 mEq/L (ref 96–112)
Creatinine, Ser: 1.36 mg/dL — ABNORMAL HIGH (ref 0.50–1.35)
GFR calc Af Amer: 62 mL/min — ABNORMAL LOW (ref >90)

## 2012-04-15 LAB — CBC
HCT: 40.3 % (ref 39.0–52.0)
MCHC: 33.3 g/dL (ref 30.0–36.0)
RDW: 12.7 % (ref 11.5–15.5)

## 2012-04-15 NOTE — Care Management Note (Signed)
    Page 1 of 1   04/15/2012     1:43:30 PM   CARE MANAGEMENT NOTE 04/15/2012  Patient:  Marcus Crawford, Marcus Crawford   Account Number:  [id number]  Date Initiated:  04/15/2012  Documentation initiated by:  Lorenda Ishihara  Subjective/Objective Assessment:   61 yo male admitted s/p lap adrenalectomy. PTA lived at home with spouse.     Action/Plan:   Anticipated DC Date:  04/16/2012   Anticipated DC Plan:  HOME/SELF CARE      DC Planning Services  CM consult      Choice offered to / List presented to:             Status of service:  Completed, signed off Medicare Important Message given?   (If response is "NO", the following Medicare IM given date fields will be blank) Date Medicare IM given:   Date Additional Medicare IM given:    Discharge Disposition:  HOME/SELF CARE  Per UR Regulation:  Reviewed for med. necessity/level of care/duration of stay  If discussed at Long Length of Stay Meetings, dates discussed:    Comments:  04-15-12 Lorenda Ishihara RN CM [id number] Barrier to d/c is pain and nausea, hopeful for d/c in am if tolerating diet and pain controlled

## 2012-04-15 NOTE — Progress Notes (Signed)
1 Day Post-Op  Subjective: Pt is doing well.  Some abd soreness as expected. Pt withs ome nausea and emesis this AM and last night. Ambulating well  Objective: Vital signs in last 24 hours: Temp:  [97.5 F (36.4 C)-98.5 F (36.9 C)] 98.1 F (36.7 C) (10/15 0525) Pulse Rate:  [50-67] 67  (10/15 0525) Resp:  [8-18] 18  (10/15 0525) BP: (101-134)/(67-81) 101/75 mmHg (10/15 0525) SpO2:  [94 %-100 %] 96 % (10/15 0525) Last BM Date: 04/14/12 (prior to admission)  Intake/Output from previous day: 10/14 0701 - 10/15 0700 In: 3990 [P.O.:480; I.V.:3510] Out: 2210 [Urine:2160; Blood:50] Intake/Output this shift:    General appearance: alert and cooperative GI: soft, non-tender; bowel sounds normal; no masses,  no organomegaly  Lab Results:   Basename 04/15/12 0400 04/14/12 1417  WBC 7.8 7.0  HGB 13.4 14.2  HCT 40.3 41.7  PLT 159 160   BMET  Basename 04/15/12 0400 04/14/12 1417  NA 137 137  K 3.8 4.5  CL 103 105  CO2 27 25  GLUCOSE 116* 154*  BUN 14 21  CREATININE 1.36* 1.52*  CALCIUM 8.4 8.5   PT/INR No results found for this basename: LABPROT:2,INR:2 in the last 72 hours ABG No results found for this basename: PHART:2,PCO2:2,PO2:2,HCO3:2 in the last 72 hours  Studies/Results: Dg Chest 2 View  04/14/2012  *RADIOLOGY REPORT*  Clinical Data: Hypertension.  Preoperative today for left adrenalectomy.  CHEST - 2 VIEW  Comparison: None.  Findings: There is no focal infiltrate, pulmonary edema, or pleural effusion.  The mediastinal contour and cardiac silhouette are normal.  The aorta is tortuous.  The soft tissue osseous structures are unremarkable.  IMPRESSION: No acute cardiopulmonary disease identified.   Original Report Authenticated By: Sherian Rein, M.D.     Anti-infectives: Anti-infectives     Start     Dose/Rate Route Frequency Ordered Stop   04/14/12 0832   vancomycin (VANCOCIN) 1,500 mg in sodium chloride 0.9 % 500 mL IVPB        1,500 mg 250 mL/hr over 120  Minutes Intravenous 120 min pre-op 04/14/12 0832 04/14/12 1103          Assessment/Plan: s/p Procedure(s) (LRB) with comments: ADRENALECTOMY (Left) - Left Adrenal Excision Advance diet Amublate Will attempt DC in AM if tol diet   LOS: 1 day    Marigene Ehlers., Daleigh Pollinger 04/15/2012

## 2012-04-16 MED ORDER — HYDROCODONE-ACETAMINOPHEN 5-325 MG PO TABS: 1.0000 | ORAL_TABLET | ORAL | Status: DC | PRN

## 2012-04-16 NOTE — Progress Notes (Signed)
Assessment unchanged. Pt and wife verbalized understanding of dc instructions. Scripts x1 given as provided by MD. Pt dc'd via wc to front entrance to meet awaiting vehicle to carry home. Accompanied by wife and NT.

## 2012-04-16 NOTE — Progress Notes (Signed)
Dr. Derrell Lolling aware via phone that pt has no h/o heart failure. Heart failure instructions were highlighted in dc orders. Dr. Derrell Lolling acknowledged he knew pt has no prior history and ask pt to disregard instructions. Instructed pt as MD requested with verbalized understanding.

## 2012-04-16 NOTE — Discharge Summary (Signed)
Physician Discharge Summary  Patient ID: Marcus Crawford MRN: [id number] DOB/AGE: 06-Apr-1951 61 y.o.  Admit date: 04/14/2012 Discharge date: 04/16/2012  Admission Diagnoses:  Discharge Diagnoses:  Active Problems:  * No active hospital problems. *    Discharged Condition: good  Hospital Course: Pt had his Lap Adrenalectomy on  04/14/2012, please see op note for full details.  Psot op the patient was sent to the floor.  He was started on a CLD and adv to a reg diet which he tol well after a few bouts of nause with his CLD.  Pt was otherwise ambulating on his own and had good pain control.  His BP was well controlled post operatively on no medication.    He was deemed stable for d/cand d/c'd home.  Consults: None  Significant Diagnostic Studies: labs: plasma aldosterone: pending (send out lab)  Treatments: surgery: 04/14/2012   Discharge Exam: Blood pressure 157/80, pulse 72, temperature 97.4 F (36.3 C), temperature source Oral, resp. rate 18, height 6\' 1"  (1.854 m), weight 221 lb 4 oz (100.358 kg), SpO2 98.00%. General appearance: alert and cooperative Cardio: regular rate and rhythm, S1, S2 normal, no murmur, click, rub or gallop GI: soft, non-tender; bowel sounds normal; no masses,  no organomegaly Incision/Wound: c/d/i  Disposition: Final discharge disposition not confirmed  Discharge Orders    Future Orders Please Complete By Expires   Diet - low sodium heart healthy      Increase activity slowly      Call MD for:  persistant nausea and vomiting      Call MD for:  severe uncontrolled pain      (HEART FAILURE PATIENTS) Call MD:  Anytime you have any of the following symptoms: 1) 3 pound weight gain in 24 hours or 5 pounds in 1 week 2) shortness of breath, with or without a dry hacking cough 3) swelling in the hands, feet or stomach 4) if you have to sleep on extra pillows at night in order to breathe.          Medication List     As of 04/16/2012 11:42 AM    STOP  taking these medications         diltiazem 240 MG 24 hr capsule   Commonly known as: CARDIZEM CD      spironolactone 100 MG tablet   Commonly known as: ALDACTONE      TAKE these medications         aspirin 81 MG tablet   Take 81 mg by mouth every morning.      HYDROcodone-acetaminophen 5-325 MG per tablet   Commonly known as: NORCO/VICODIN   Take 1-2 tablets by mouth every 4 (four) hours as needed.      multivitamin with minerals tablet   Take 1 tablet by mouth daily.      RABEprazole 20 MG tablet   Commonly known as: ACIPHEX   Take 1 tablet (20 mg total) by mouth daily.           Follow-up Information    Follow up with [name], MD. In 2 weeks.   Contact information:   [address] [place] [address] [phone]       Follow up with Marga Melnick, MD. In 7 days.   Contact information:   [address] [facility] [address] [place] [address] [phone]          Signed: Marigene Ehlers., Jed Limerick 04/16/2012, 11:42 AM

## 2012-04-24 ENCOUNTER — Ambulatory Visit (INDEPENDENT_AMBULATORY_CARE_PROVIDER_SITE_OTHER): Payer: BC Managed Care – PPO | Admitting: Internal Medicine

## 2012-04-24 ENCOUNTER — Encounter: Payer: Self-pay | Admitting: Internal Medicine

## 2012-04-24 VITALS — BP 124/90 | HR 61 | Temp 98.0°F | Wt 223.0 lb

## 2012-04-24 DIAGNOSIS — R7309 Other abnormal glucose: Secondary | ICD-10-CM

## 2012-04-24 DIAGNOSIS — D3500 Benign neoplasm of unspecified adrenal gland: Secondary | ICD-10-CM

## 2012-04-24 DIAGNOSIS — IMO0001 Reserved for inherently not codable concepts without codable children: Secondary | ICD-10-CM

## 2012-04-24 DIAGNOSIS — M791 Myalgia, unspecified site: Secondary | ICD-10-CM

## 2012-04-24 DIAGNOSIS — I10 Essential (primary) hypertension: Secondary | ICD-10-CM

## 2012-04-24 NOTE — Progress Notes (Signed)
  Subjective:    Patient ID: Marcus Crawford, male    DOB: May 30, 1951, 61 y.o.   MRN: [id number]  HPI   He has had no complications from the left adrenalectomy 04/14/12. Creatinine at the time of surgery was 1.5 to; followup bili was 1.36. Glucoses have ranged from 110-154.  He is off all antihypertensive medications. His blood pressure average is 120/80 at home.  He specifically denies chest pain, palpitations, dyspnea, or claudication.    Review of Systems  He does have some muscle cramps in the morning on occasion.     Objective:   Physical Exam He appears healthy and well-nourished; he is in no acute distress  No carotid bruits are present.  Heart rhythm and rate are normal with no significant murmurs or gallops.  Chest is clear with no increased work of breathing  There is no evidence of aortic aneurysm or renal artery bruits  He has no clubbing or edema.   Pedal pulses are intact   No ischemic skin changes are present         Assessment & Plan:

## 2012-04-24 NOTE — Patient Instructions (Addendum)
Blood Pressure Goal  Ideally is an AVERAGE < 135/85. This AVERAGE should be calculated from @ least 5-7 BP readings taken @ different times of day on different days of week. You should not respond to isolated BP readings , but rather the AVERAGE for that week.  If you activate My Chart; the results can be released to you as soon as they populate from the lab. If you choose not to use this program; the labs have to be reviewed, copied & mailed   causing a delay in getting the results to you.  

## 2012-04-25 LAB — BASIC METABOLIC PANEL
BUN: 29 mg/dL — ABNORMAL HIGH (ref 6–23)
CO2: 26 mEq/L (ref 19–32)
Calcium: 9.3 mg/dL (ref 8.4–10.5)
Creatinine, Ser: 1.7 mg/dL — ABNORMAL HIGH (ref 0.4–1.5)
GFR: 43.98 mL/min — ABNORMAL LOW (ref >60.00)
Glucose, Bld: 106 mg/dL — ABNORMAL HIGH (ref 70–99)

## 2012-04-30 LAB — VITAMIN D 1,25 DIHYDROXY: Vitamin D3 1, 25 (OH)2: 18 pg/mL

## 2012-05-05 ENCOUNTER — Telehealth: Payer: Self-pay

## 2012-05-05 NOTE — Telephone Encounter (Signed)
Pt called to get clarity of lab results: Fasting glucose minimally elevated but A1c, diabetes screening test , is normal.  BUN, creatinine, and GFR all assess kidney function. To protect the kidneys it is important to control your blood pressure and sugar. You should also stay well hydrated. Drink to thirst, up to 32 ounces of fluid a day.  Blood Pressure Goal Ideally is an AVERAGE < 135/85. This AVERAGE should be calculated from @ least 5-7 BP readings taken @ different times of day on different days of week. You should not respond to isolated BP readings , but rather the AVERAGE for that week Recheck BMET in 7-10 days & see me 2-3 days later with BP diary & all meds (even those not being taken @ present such as Spironolactone) .PLEASE BRING THESE INSTRUCTIONS TO FOLLOW UP LAB APPOINTMENT.This will guarantee correct labs are drawn, eliminating need for repeat blood sampling ( needle sticks ! ). Diagnoses /Codes: 401.9.Hopefully the kidney impairment is simply a perioperative issue which will reverse with hydration & BP control. Thank you for using My Chart; it has served [place] well.    After helping him. I scheduled lab appt 05/06/12 9am F/U lab appt 05/09/12 10am. Pt stated understanding.   MW

## 2012-05-06 ENCOUNTER — Other Ambulatory Visit (INDEPENDENT_AMBULATORY_CARE_PROVIDER_SITE_OTHER): Payer: BC Managed Care – PPO

## 2012-05-06 DIAGNOSIS — I10 Essential (primary) hypertension: Secondary | ICD-10-CM

## 2012-05-06 LAB — BASIC METABOLIC PANEL
CO2: 24 mEq/L (ref 19–32)
Chloride: 108 mEq/L (ref 96–112)
GFR: 48.62 mL/min — ABNORMAL LOW (ref >60.00)
Glucose, Bld: 93 mg/dL (ref 70–99)
Potassium: 4.7 mEq/L (ref 3.5–5.1)
Sodium: 139 mEq/L (ref 135–145)

## 2012-05-07 ENCOUNTER — Encounter (INDEPENDENT_AMBULATORY_CARE_PROVIDER_SITE_OTHER): Payer: Self-pay | Admitting: General Surgery

## 2012-05-07 ENCOUNTER — Ambulatory Visit (INDEPENDENT_AMBULATORY_CARE_PROVIDER_SITE_OTHER): Payer: BC Managed Care – PPO | Admitting: General Surgery

## 2012-05-07 VITALS — BP 120/64 | HR 60 | Temp 97.8°F | Resp 12 | Ht 73.0 in | Wt 227.4 lb

## 2012-05-07 DIAGNOSIS — Z9889 Other specified postprocedural states: Secondary | ICD-10-CM

## 2012-05-07 NOTE — Progress Notes (Signed)
Patient ID: Marcus Crawford, male   DOB: 06-06-1951, 61 y.o.   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  The patient has been doing well postoperatively. Patient has been off all blood pressure medication at this time and currently has normal potassium level. This was followed with Dr. Alwyn Ren since his operation.  On exam: Wounds clean dry and intact no signs of erythema or drainage.  Assessment and plan:  Patient's followup Dr. Leanor Rubenstein scheduled. Patient okay for her flu shot this time.  The patient to follow with me times 1 month.

## 2012-05-09 ENCOUNTER — Encounter: Payer: Self-pay | Admitting: Internal Medicine

## 2012-05-09 ENCOUNTER — Ambulatory Visit (INDEPENDENT_AMBULATORY_CARE_PROVIDER_SITE_OTHER): Payer: BC Managed Care – PPO | Admitting: Internal Medicine

## 2012-05-09 VITALS — BP 148/98 | HR 68 | Wt 224.8 lb

## 2012-05-09 DIAGNOSIS — I10 Essential (primary) hypertension: Secondary | ICD-10-CM

## 2012-05-09 MED ORDER — RAMIPRIL 2.5 MG PO CAPS: 2.5000 mg | ORAL_CAPSULE | Freq: Every day | ORAL | Status: DC

## 2012-05-09 NOTE — Progress Notes (Signed)
  Subjective:    Patient ID: Marcus Crawford, male    DOB: 1950/11/25, 61 y.o.   MRN: [id number]  HPI  He's been off blood pressure medicines since October 14; blood pressures range from 120/64-135/91. He denies headache, epistaxis, chest pain, palpitations, exertional dyspnea, edema, or claudication.  On 10/24 creatinine was 1.7, GFR 43.98, and BUN 29. With no change in medications and increased hydration; creatinine was 1.5, GFR 48.6, BUN 33 on 11/5.  Potassium had a pre op nadir of 2.8 on 3/14 ; it is now 4.7.   Review of Systems He is walking 1.31miles per day without symptoms as noted.      Objective:   Physical Exam He appears healthy and well-nourished; he is in no acute distress  No carotid bruits are present.  Heart rhythm and rate are normal with no significant murmurs or gallops. S 4  Chest is clear with no increased work of breathing  There is no evidence of aortic aneurysm or renal artery bruits  He has no clubbing or edema.   Pedal pulses are = ; DPP slightly difficult to palpate  No ischemic skin changes are present ; good hair growth over the dorsum of feet        Assessment & Plan:

## 2012-05-09 NOTE — Assessment & Plan Note (Addendum)
Renal function has improved with hydration. Overall blood pressure control appears to be good I would recommend a very low-dose ramipril for nephro protection. Risk : Benefit discussed

## 2012-05-09 NOTE — Patient Instructions (Addendum)
Please  schedule fasting Labs in 6 weeks : BMET. PLEASE BRING THESE INSTRUCTIONS TO FOLLOW UP  LAB APPOINTMENT.This will guarantee correct labs are drawn, eliminating need for repeat blood sampling ( needle sticks ! ). Diagnoses /Codes: 401.9 Blood Pressure Goal = < 140/90;ideal is an AVERAGE < 135/85. This AVERAGE should be calculated from @ least 5-7 BP readings taken @ different times of day on different days of week. You should not respond to isolated BP readings , but rather the AVERAGE for that week

## 2012-05-18 ENCOUNTER — Encounter: Payer: Self-pay | Admitting: Internal Medicine

## 2012-05-20 ENCOUNTER — Ambulatory Visit (INDEPENDENT_AMBULATORY_CARE_PROVIDER_SITE_OTHER): Payer: BC Managed Care – PPO

## 2012-05-20 DIAGNOSIS — Z23 Encounter for immunization: Secondary | ICD-10-CM

## 2012-06-05 ENCOUNTER — Ambulatory Visit (INDEPENDENT_AMBULATORY_CARE_PROVIDER_SITE_OTHER): Payer: BC Managed Care – PPO | Admitting: General Surgery

## 2012-06-05 ENCOUNTER — Encounter (INDEPENDENT_AMBULATORY_CARE_PROVIDER_SITE_OTHER): Payer: Self-pay | Admitting: General Surgery

## 2012-06-05 VITALS — BP 116/78 | HR 68 | Temp 97.7°F | Resp 12 | Ht 73.0 in | Wt 229.0 lb

## 2012-06-05 DIAGNOSIS — Z9889 Other specified postprocedural states: Secondary | ICD-10-CM

## 2012-06-05 NOTE — Progress Notes (Signed)
Patient ID: Marcus Crawford, male   DOB: Apr 27, 1951, 61 y.o.   MRN: [id number] The patient is a 61 year old male status post left adrenalectomy. Patient has been doing well postoperatively and is off all blood pressure medication with stable blood pressure. Patient at no point in time. He's been following a with his PCP.  On exam: Wounds clean dry and intact  Assessment and plan: The patient follow up with me when necessary. Patient to follow up with Dr. Alwyn Ren as scheduled.

## 2012-06-20 ENCOUNTER — Encounter: Payer: Self-pay | Admitting: Endocrinology

## 2012-06-20 ENCOUNTER — Other Ambulatory Visit (INDEPENDENT_AMBULATORY_CARE_PROVIDER_SITE_OTHER): Payer: BC Managed Care – PPO

## 2012-06-20 DIAGNOSIS — I10 Essential (primary) hypertension: Secondary | ICD-10-CM

## 2012-06-20 LAB — BASIC METABOLIC PANEL
CO2: 28 mEq/L (ref 19–32)
Chloride: 108 mEq/L (ref 96–112)
Creatinine, Ser: 1.7 mg/dL — ABNORMAL HIGH (ref 0.4–1.5)
Glucose, Bld: 110 mg/dL — ABNORMAL HIGH (ref 70–99)

## 2012-06-20 NOTE — Progress Notes (Signed)
LABS ONLY  

## 2012-06-30 ENCOUNTER — Encounter: Payer: Self-pay | Admitting: Internal Medicine

## 2012-07-01 ENCOUNTER — Other Ambulatory Visit: Payer: Self-pay | Admitting: Internal Medicine

## 2012-07-01 DIAGNOSIS — E2609 Other primary hyperaldosteronism: Secondary | ICD-10-CM

## 2012-07-01 DIAGNOSIS — N289 Disorder of kidney and ureter, unspecified: Secondary | ICD-10-CM

## 2012-07-01 DIAGNOSIS — D3500 Benign neoplasm of unspecified adrenal gland: Secondary | ICD-10-CM

## 2012-07-21 ENCOUNTER — Encounter: Payer: Self-pay | Admitting: Internal Medicine

## 2012-07-29 ENCOUNTER — Other Ambulatory Visit: Payer: Self-pay | Admitting: Internal Medicine

## 2012-07-31 ENCOUNTER — Telehealth: Payer: Self-pay | Admitting: Gastroenterology

## 2012-07-31 NOTE — Telephone Encounter (Signed)
Patient states that his wife reports that at night he has "tremors and muscle twitching". He does not note any of these side effects during the day.   He read the side effects that were provided by his pharmacy.  "tell your doctor right away if you have any serious side effects including: symptoms of low magnesium level (such as a fast/slow/ irregular heartbeat, persistent muscle spasms, seizures).  "  Patient has had a adrenalectomy since his last visit and is being referred to a nephrologist for renal insufficiency.  His last magnesium level was drawn 04/24/12 and was normal at 2.3. He questions if the generic aciphex could be causing these symptoms?  Should ne change to an alternate PPI?Marland Kitchen  Please advise.

## 2012-07-31 NOTE — Telephone Encounter (Signed)
My last office visit with this patient was in 10/2007. We are not refilling his Aciphex at this time. However it appears that he has taken Aciphex for years. It apparently was originally prescribed by Dr. Victorino Dike.   I doubt his symtpoms are related to Aciphex or the generic equivalent. I recommend that he contact Dr. Alwyn Ren for advice on his night time tremors and twitching as it is out of my area of expertise.

## 2012-08-01 NOTE — Telephone Encounter (Signed)
Patient advised.

## 2012-08-22 ENCOUNTER — Other Ambulatory Visit: Payer: Self-pay | Admitting: Nephrology

## 2012-08-25 ENCOUNTER — Ambulatory Visit
Admission: RE | Admit: 2012-08-25 | Discharge: 2012-08-25 | Disposition: A | Discharge status: Home / Self Care | Payer: BC Managed Care – PPO | Source: Ambulatory Visit | Attending: Nephrology | Admitting: Nephrology

## 2012-10-05 ENCOUNTER — Other Ambulatory Visit: Payer: Self-pay | Admitting: Internal Medicine

## 2012-10-28 ENCOUNTER — Telehealth: Payer: Self-pay | Admitting: General Practice

## 2012-10-28 NOTE — Telephone Encounter (Signed)
This test can be canceled; [facility] will determine frequency of BMET testing

## 2012-10-28 NOTE — Telephone Encounter (Signed)
Pt.notified

## 2012-10-28 NOTE — Telephone Encounter (Signed)
Pt called wanting to know if he still needed an appt for a BMET due to him having a CMP completed on 08/21/12 and a BMET on 06/20/12. States this was reason he was told to make a 6 week follow up from his 05/2012 appt. Please advise.

## 2012-10-30 IMAGING — XA IR VENOUS SAMPLING
1 series · 13 of 24 positions shown · non-contrast
Comparison: none

***ADDENDUM*** CREATED: 03/13/2012 [DATE]

The results of the off-site Laboratory analysis of the bilateral
adrenal vein, and peripheral vein samplings have arrived and are
enumerated below.
            Yveline Freedman/Cort                 Post
Stimulation Fen/Cort
Left Adrenal            6164 ng/dl  / 312 ug/dl       11,913 ng/dl
/ 6733 ug/dl
Right Adrenal     <1 ng/dl / 19.1 ug/dl         4 ng/dl /
ug/dl
Peripheral        4 ng/dl / 22.3 ug/dl          15 ng/dl / 24 ug/dl
There is significantly increased aldosterone, cortisol and
alosterone / cortisol ratio  from the left adrenal vein compared to
the peripheral vein, and right adrenal vein and both before, and
following stimulation with antegrade adrenal corticotrophic hormone
(ACTH).
The study is therefore positive, and the findings are consistent
with an aldosterone secreting source within the left adrenal gland
which corresponds with the location of the adenoma seen on the
prior CT.
IR BILATERAL ADRENAL VENOUS SAMPLING
Date: 02/28/2012
CLINICAL HISTORY: 61-year-old gentleman with a 1 year history of
hypertension and hypokalemia concerning for hyperaldosteronism.  He
has a small left adrenal nodule identified on prior CT scan.
Adrenal venous sampling is requested for lateralization to direct
possible surgical versus medical therapy.

[Series 1: run · 13 of 45 slices shown]
[im 1/45]
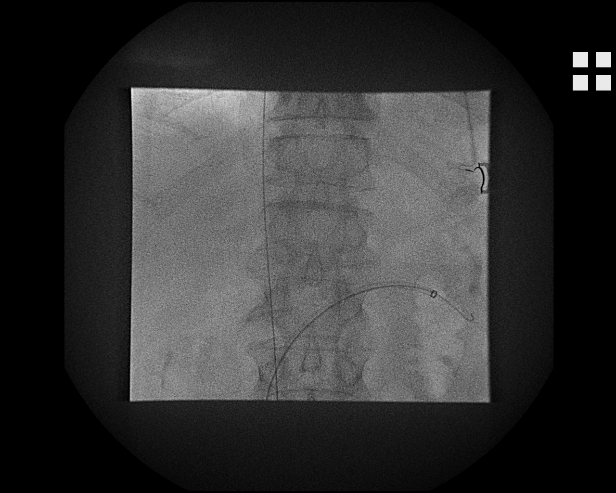
[im 4/45]
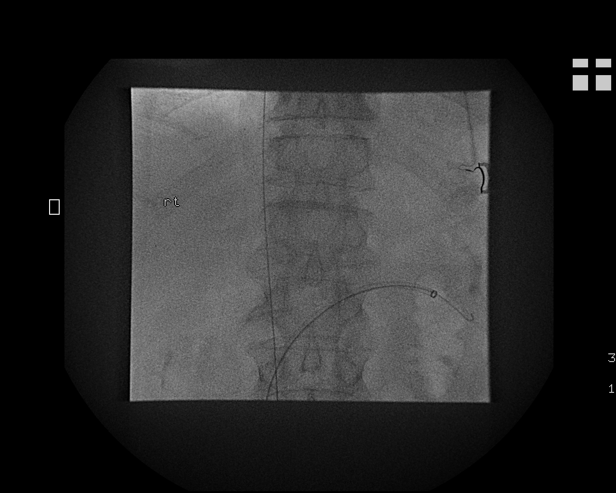
[im 8/45]
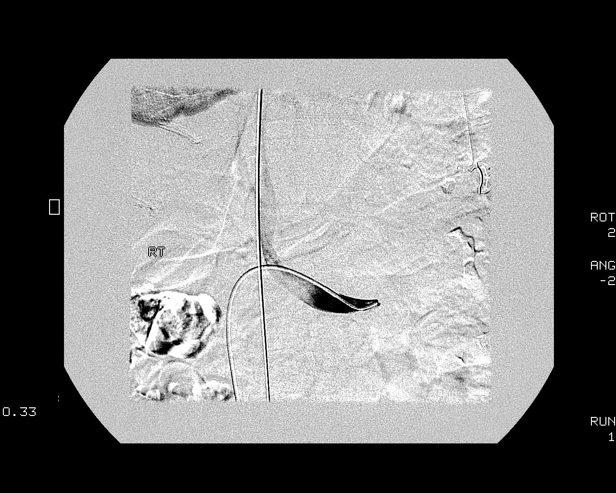
[im 12/45]
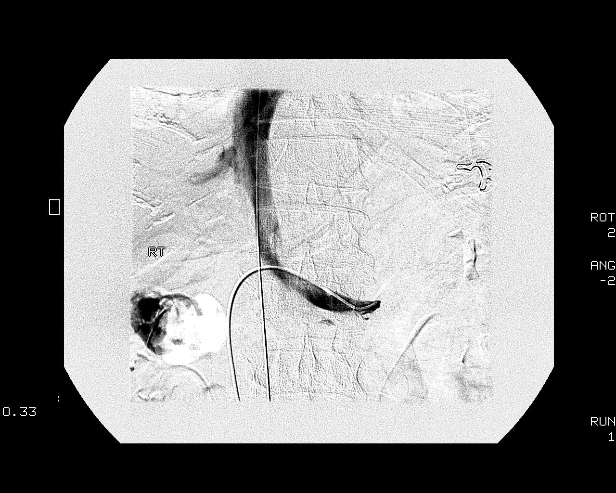
[im 16/45]
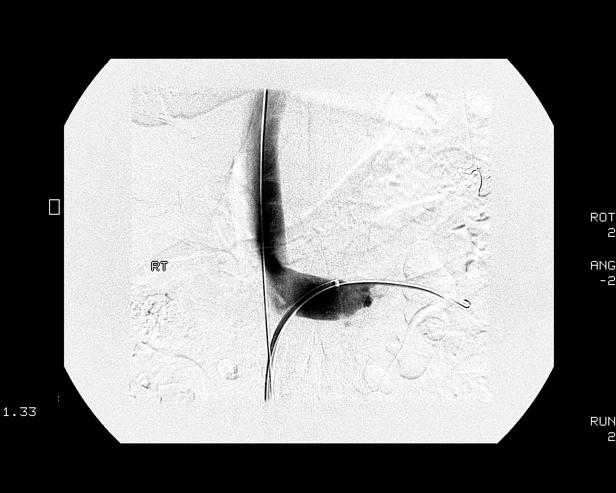
[im 20/45]
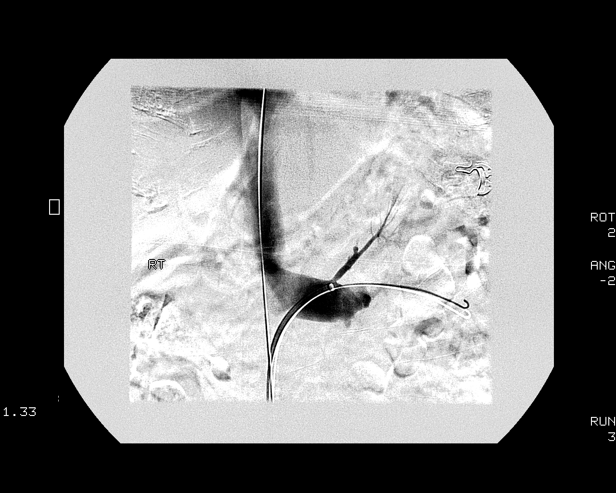
[im 23/45]
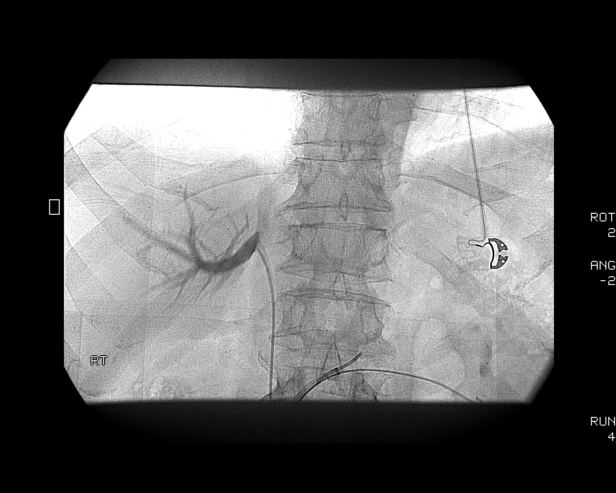
[im 25/45]
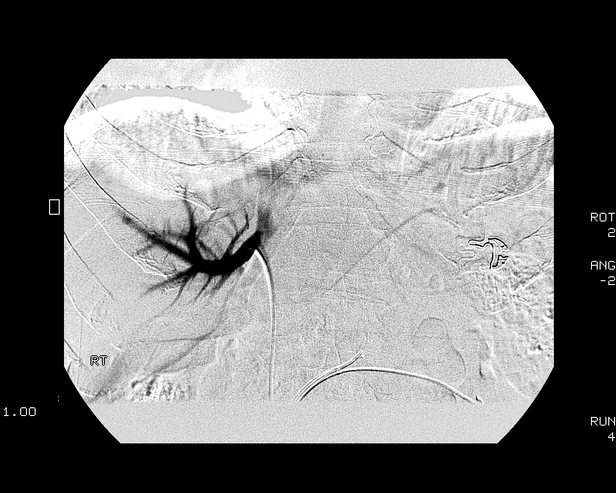
[im 29/45]
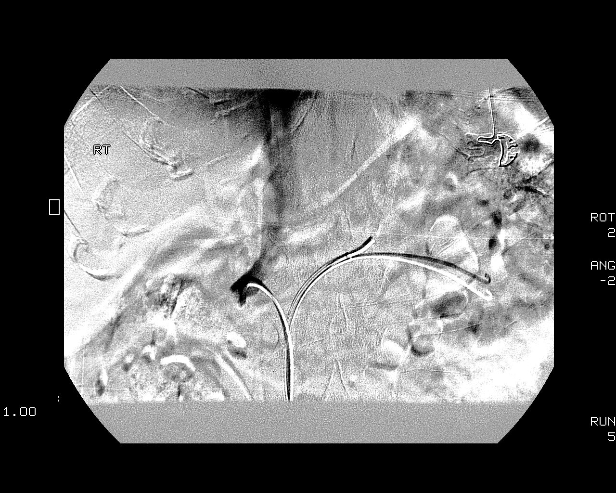
[im 33/45]
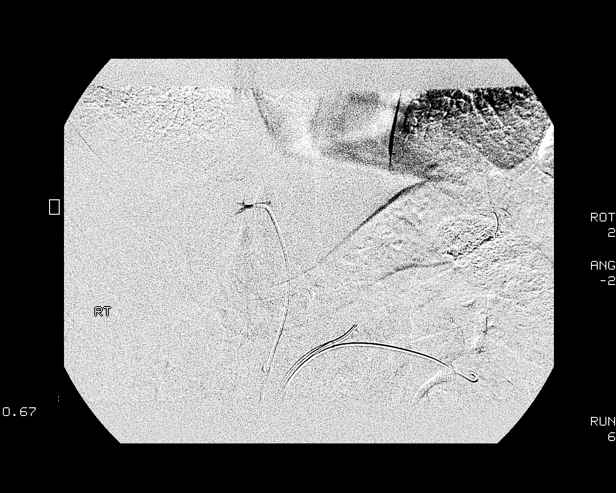
[im 37/45]
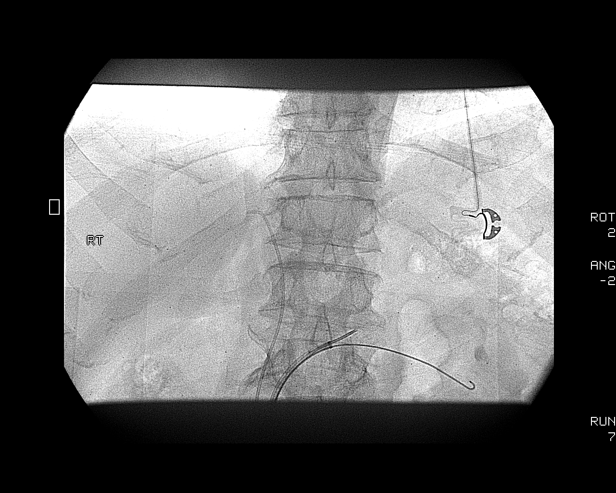
[im 41/45]
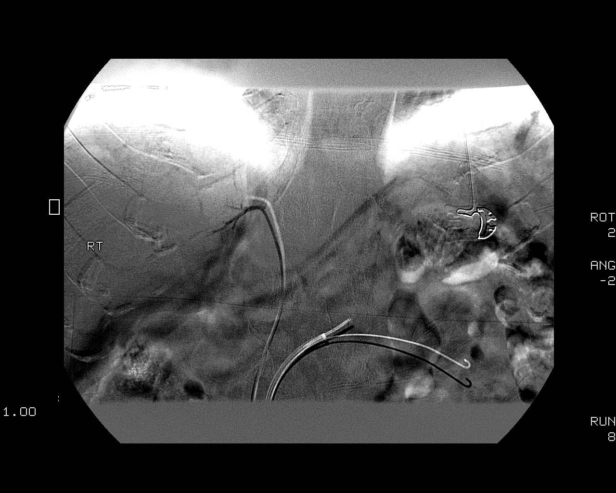
[im 45/45]
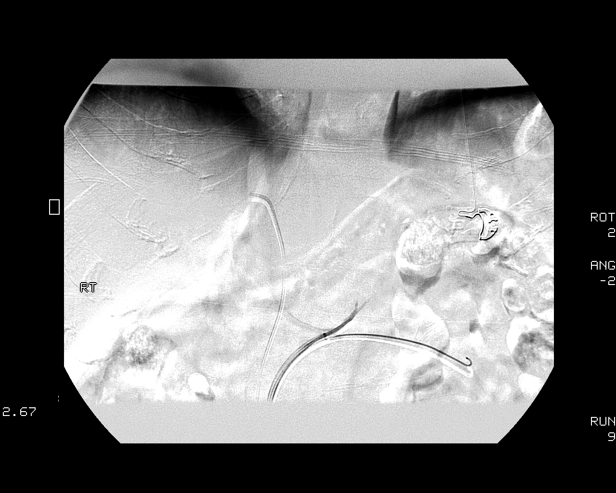

[13 of 24 positions shown; findings below may reference images not displayed]

Procedures Performed:
1.  Ultrasound-guided access of the right common femoral vein
2.  Ultrasound-guided access of the left common femoral vein
3.  Catheterization of the left renal vein and left renal venogram
4.  Catheterization of the left adrenal gland and left adrenal
venography
5.  Catheterization of an accessory right hepatic vein with
venography
6.  Catheterization of the right renal vein with venography
7.  Catheterization of the right adrenal vein with venography
8.  Bilateral adrenal venous sampling
9.  Conscious sedation

Sedation: Moderate (conscious) sedation was used.  2 mg Versed, 100
mcg Fentanyl were administered intravenously.  The patient's vital
signs were monitored continuously by radiology nursing throughout
the procedure.

Sedation Time: 93 minutes minutes

Fluoroscopy time: 19 minutes

Contrast volume: 70 ml [id number] administered intravenously

PROCEDURE/FINDINGS:

 Informed consent was obtained from the patient following
explanation of the procedure, risks, benefits and alternatives.
The patient understands, agrees and consents for the procedure.
All questions were addressed. A time out was performed.

Maximal barrier sterile technique utilized including caps, mask,
sterile gowns, sterile gloves, large sterile drape, hand hygiene,
and betadine skin prep.

The right groin was interrogated with ultrasound.  The right common
femoral vein was found to be widely patent.  Local anesthesia was
achieved with infiltration of 1% lidocaine.  Under direct
sonographic guidance, the vein was punctured with a 21-gauge
micropuncture needle.  An image was obtained stored for the
electronic medical record.  The transitional 5-French dilator was
then advanced over a wire into the femoral vein and a 0.035 inches
Bentson wire advanced into the inferior vena cava.  A 6-French
vascular sheath was then advanced over the wire.  Attention was
then turned to the left groin.

The left groin was interrogated with ultrasound and the common
femoral vein found to be widely patent.  Local anesthesia was
achieved with infiltration of 1% lidocaine.  Under direct
sonographic guidance, the vein was punctured with a 21-gauge
micropuncture needle.  An image was obtained and stored for the
electronic medical record.  The transitional 5-French dilator was
advanced into the vein over the micropuncture wire, and
subsequently a 0.035 inches Bentson wire was advanced into the
inferior vena cava and a 6-French sheath advanced over the wire.
The patient was then systemically heparinized with 8444 units of
heparin administered through a peripheral IV.

Working through the left sheath, a C2 Cobra catheter with
additional side holes and Bentson wire were used to select the left
renal vein.  A left renal venogram was performed to confirm
catheter placement and anatomy.  Several attempts to catheterize
the left adrenal gland were then made with the Cobra catheter and a
Kumpe catheter with a modified tip (a small V was cut on the
undersurface of the catheter tip to facilitate aspiration).  This
was unsuccessful due to the caudal sloping course of the vessel.
Therefore, a Rosen wire was advanced into a distal renal vein in
the lower pole of the kidney, and the 6-French sheath exchanged for
a 9-French TIPS sheath.  The 9-French TIPS sheath was then advanced
over the wire and into the deep renal vein.

Utilizing the PRESS technique (pull-back reinforced straight
sheath) the sheath was pulled back over the Rosen wire while
puffing contrast until the ostium of the left adrenal vein was
identified.  The modified 5-French Kumpe catheter preloaded with a
Glidewire was then advanced through the sheath next to the
supporting Rosen wire and the left adrenal vein was selected.  A
gentle hand injection of contrast material into the left adrenal
vein confirmed catheter tip placement.

Attention was again turned to the right groin.  A C2 Cobra catheter
with additional end holes was used to select the right adrenal
vein.  A gentle hand injection of contrast material confirmed
catheter tip placement with in the right adrenal vein.  During the
course of selecting the right adrenal vein, an accessory right
hepatic venogram, and right renal venogram were performed.  There
is no evidence of accessory adrenal venous branches from either of
these vessels.

The catheters were then carefully flushed and 8 ml of blood
aspirated simultaneously from both the left and right adrenal
veins.    8 ml of peripheral venous blood was then done medially
aspirated from the right iliac venous sheath.  All samples were
labeled pre stimulation.

After a 5-minute delay a, 250 mcg bolus adrenocorticotropic hormone
(ACTH) was administered in through a peripheral IV.  After an
additional 20-minute delay, post stimulation samples were drawn
from both adrenal veins, and the peripheral sheath using the same
technique described above.

Several minutes before the end of the 20-minute delay, gentle hand
injections of contrast material were again performed through both
catheters confirming unchanged location within the bilateral
adrenal veins.

Following normalization of the ACT level, the bilateral groin
sheaths were pulled and hemostasis obtained with manual pressure.
The patient tolerated the procedure well, there were no immediate
complications.
IMPRESSION: 1.  Successful bilateral adrenal venous sampling. The collected
samples will be sent out for evaluation of the aldosterone and
cortisol levels both pre, and post ACTH stimulation.

2.  Venography of the bilateral renal, and adrenal veins and
accessory right hepatic vein.

[REDACTED]

## 2012-11-06 ENCOUNTER — Encounter: Payer: Self-pay | Admitting: Internal Medicine

## 2012-11-06 ENCOUNTER — Telehealth: Payer: Self-pay | Admitting: General Practice

## 2012-11-06 NOTE — Telephone Encounter (Signed)
PA started for Pt's Rabeprazole Sod on 11/06/12.

## 2012-11-10 NOTE — Telephone Encounter (Signed)
PA approved 10/17/12 - 11/07/13. Faxed to Pharmacy, and scanned into Pt's chart.

## 2012-12-29 ENCOUNTER — Encounter: Payer: Self-pay | Admitting: Internal Medicine

## 2012-12-29 ENCOUNTER — Ambulatory Visit (INDEPENDENT_AMBULATORY_CARE_PROVIDER_SITE_OTHER): Payer: BC Managed Care – PPO | Admitting: Internal Medicine

## 2012-12-29 VITALS — BP 142/86 | HR 66 | Temp 98.0°F | Ht 74.0 in | Wt 226.0 lb

## 2012-12-29 DIAGNOSIS — Z86010 Personal history of colonic polyps: Secondary | ICD-10-CM

## 2012-12-29 DIAGNOSIS — Z0000 Encounter for general adult medical examination without abnormal findings: Secondary | ICD-10-CM

## 2012-12-29 LAB — LIPID PANEL
HDL: 37.9 mg/dL — ABNORMAL LOW (ref >39.00)
Total CHOL/HDL Ratio: 6
VLDL: 65.8 mg/dL — ABNORMAL HIGH (ref 0.0–40.0)

## 2012-12-29 LAB — TSH: TSH: 2.02 u[IU]/mL (ref 0.35–5.50)

## 2012-12-29 NOTE — Patient Instructions (Addendum)
Reflux of gastric acid may be asymptomatic as this may occur mainly during sleep.The triggers for reflux  include stress; the "aspirin family" ; alcohol; peppermint; and caffeine (coffee, tea, cola, and chocolate). The aspirin family would include aspirin and the nonsteroidal agents such as ibuprofen &  Naproxen. Tylenol would not cause reflux. If having symptoms ; food & drink should be avoided for @ least 2 hours before going to bed.  Because of a history of documented adverse serious drug reaction;Medi Alert bracelet  is recommended for penicillin & sulfa allergies. Minimal Blood Pressure Goal= AVERAGE < 140/90;  Ideal is an AVERAGE < 135/85. This AVERAGE should be calculated from @ least 5-7 BP readings taken @ different times of day on different days of week. You should not respond to isolated BP readings , but rather the AVERAGE for that week .Please bring your  blood pressure cuff to office visits to verify that it is reliable.It  can also be checked against the blood pressure device at the pharmacy. Finger or wrist cuffs are not dependable; an arm cuff is.

## 2012-12-29 NOTE — Addendum Note (Signed)
Addended by: Silvio Pate D on: 12/29/2012 01:33 PM   Modules accepted: Orders

## 2012-12-29 NOTE — Progress Notes (Signed)
  Subjective:    Patient ID: Marcus Crawford, male    DOB: [birth year], 62 y.o.   MRN: [id number]  HPI  He is here for a physical;acute issues include stage III renal insufficiency of unknown etiology for which he sees Dr. Hyman Hopes.     Review of Systems    Lab studies performed by Dr. Hyman Hopes 10/02/12 were reviewed. These included CBC and differential; Compazine metabolic profile; and PTH. The only abnormalities were a BUN of 30; creatinine 1.71; and GFR of 41.  BP @ home ranges 118/61 -148/95; no average of values  His reflux is well-controlled with the PPI every other day. He does have a past history of esophageal stricture which was dilated. Colonoscopy is up-to-date.      Objective:   Physical Exam Gen.: Healthy and well-nourished in appearance. Alert, appropriate and cooperative throughout exam.Appears younger than stated age  Head: Normocephalic without obvious abnormalities; no alopecia  Eyes: No corneal or conjunctival inflammation noted. Pupils equal round reactive to light and accommodation. Extraocular motion intact. Vision grossly decreased w/o lenses Ears: External  ear exam reveals no significant lesions or deformities. Canals clear .TMs normal. Hearing is grossly normal bilaterally. Nose: External nasal exam reveals no deformity or inflammation. Nasal mucosa are pink and moist. No lesions or exudates noted.  Mouth: Oral mucosa and oropharynx reveal no lesions or exudates. Teeth in good repair. Neck: No deformities, masses, or tenderness noted. Range of motion & Thyroid normal. Lungs: Normal respiratory effort; chest expands symmetrically. Lungs are clear to auscultation without rales, wheezes, or increased work of breathing. Heart: Slow rate and regular rhythm. Normal S1 and S2. No gallop, click, or rub. No murmur. Abdomen: Bowel sounds normal; abdomen soft and nontender. No masses, organomegaly or hernias noted. Genitalia: Genitalia normal except for left varices & epididymal  granuloma bilaterally. Prostate is normal without enlargement, asymmetry, nodularity, or induration.                             Musculoskeletal/extremities: There is slight asymmetry of the posterior thoracic musculature suggesting occult scoliosis. No clubbing, cyanosis, edema, or significant extremity  deformity noted. Range of motion normal .Tone & strength  Normal. Joints normal . Nail health good. Able to lie down & sit up w/o help. Negative SLR bilaterally Vascular: Carotid, radial artery, dorsalis pedis and  posterior tibial pulses are full and equal. No bruits present. Neurologic: Alert and oriented x3. Deep tendon reflexes symmetrical and normal.          Skin: Intact without suspicious lesions or rashes. Lymph: No cervical, axillary, or inguinal lymphadenopathy present. Psych: Mood and affect are normal. Normally interactive                                                                                        Assessment & Plan:  #1 comprehensive physical exam; no acute findings  Plan: see Orders  & Recommendations

## 2012-12-31 ENCOUNTER — Encounter: Payer: Self-pay | Admitting: Internal Medicine

## 2012-12-31 NOTE — Telephone Encounter (Signed)
I spoke with patient referral coordinator, patient can call and request to see another MD himself since he is an established patient with [facility]

## 2013-01-01 ENCOUNTER — Encounter: Payer: Self-pay | Admitting: Internal Medicine

## 2013-01-02 LAB — VITAMIN D 1,25 DIHYDROXY: Vitamin D3 1, 25 (OH)2: 31 pg/mL

## 2013-02-27 ENCOUNTER — Other Ambulatory Visit: Payer: Self-pay | Admitting: Internal Medicine

## 2013-02-27 NOTE — Telephone Encounter (Signed)
Rx sent the to pharmacy by e-script.//AB/CMA

## 2013-03-19 ENCOUNTER — Other Ambulatory Visit (HOSPITAL_COMMUNITY): Payer: Self-pay | Admitting: Urology

## 2013-03-19 DIAGNOSIS — N289 Disorder of kidney and ureter, unspecified: Secondary | ICD-10-CM

## 2013-04-08 ENCOUNTER — Ambulatory Visit (INDEPENDENT_AMBULATORY_CARE_PROVIDER_SITE_OTHER): Payer: BC Managed Care – PPO | Admitting: *Deleted

## 2013-04-08 DIAGNOSIS — Z23 Encounter for immunization: Secondary | ICD-10-CM

## 2013-05-04 ENCOUNTER — Ambulatory Visit (HOSPITAL_COMMUNITY)
Admission: RE | Admit: 2013-05-04 | Discharge: 2013-05-04 | Disposition: A | Discharge status: Home / Self Care | Payer: BC Managed Care – PPO | Source: Ambulatory Visit | Attending: Urology | Admitting: Urology

## 2013-05-04 DIAGNOSIS — N289 Disorder of kidney and ureter, unspecified: Secondary | ICD-10-CM

## 2013-05-04 DIAGNOSIS — C61 Malignant neoplasm of prostate: Secondary | ICD-10-CM | POA: Insufficient documentation

## 2013-05-04 LAB — CREATININE, SERUM
GFR calc Af Amer: 52 mL/min — ABNORMAL LOW (ref >90)
GFR calc non Af Amer: 45 mL/min — ABNORMAL LOW (ref >90)

## 2013-05-04 MED ORDER — GADOBENATE DIMEGLUMINE 529 MG/ML IV SOLN
20.0000 mL | Freq: Once | INTRAVENOUS | Status: AC | PRN
  Administered 2013-05-04: 20 mL via INTRAVENOUS

## 2013-06-07 ENCOUNTER — Other Ambulatory Visit: Payer: Self-pay | Admitting: Internal Medicine

## 2013-06-08 NOTE — Telephone Encounter (Signed)
Aciphex refilled per protocol

## 2013-06-15 ENCOUNTER — Telehealth: Payer: Self-pay | Admitting: Internal Medicine

## 2013-06-15 ENCOUNTER — Other Ambulatory Visit: Payer: Self-pay | Admitting: Internal Medicine

## 2013-06-15 DIAGNOSIS — E559 Vitamin D deficiency, unspecified: Secondary | ICD-10-CM | POA: Insufficient documentation

## 2013-06-15 DIAGNOSIS — E785 Hyperlipidemia, unspecified: Secondary | ICD-10-CM

## 2013-06-15 NOTE — Telephone Encounter (Signed)
Patient had labs done on 12/29/12 and was told to Recheck vit D level in 4 -6 months. He is coming in to have this rechecked tomorrow and wants to know if he can also have his triglycerides levels checked tomorrow. Please advise and enter orders.

## 2013-06-15 NOTE — Telephone Encounter (Signed)
Please advise.//AB/CMA 

## 2013-06-16 ENCOUNTER — Other Ambulatory Visit (INDEPENDENT_AMBULATORY_CARE_PROVIDER_SITE_OTHER): Payer: BC Managed Care – PPO

## 2013-06-16 DIAGNOSIS — E559 Vitamin D deficiency, unspecified: Secondary | ICD-10-CM

## 2013-06-16 DIAGNOSIS — E785 Hyperlipidemia, unspecified: Secondary | ICD-10-CM

## 2013-06-16 LAB — LIPID PANEL
HDL: 37 mg/dL — ABNORMAL LOW (ref >39.00)
Triglycerides: 211 mg/dL — ABNORMAL HIGH (ref 0.0–149.0)

## 2013-06-17 LAB — LDL CHOLESTEROL, DIRECT: Direct LDL: 134.5 mg/dL

## 2013-07-30 ENCOUNTER — Encounter: Payer: Self-pay | Admitting: Internal Medicine

## 2013-07-30 ENCOUNTER — Ambulatory Visit (INDEPENDENT_AMBULATORY_CARE_PROVIDER_SITE_OTHER): Payer: BC Managed Care – PPO | Admitting: Internal Medicine

## 2013-07-30 VITALS — BP 124/66 | HR 66 | Temp 97.7°F | Wt 227.4 lb

## 2013-07-30 DIAGNOSIS — I10 Essential (primary) hypertension: Secondary | ICD-10-CM

## 2013-07-30 DIAGNOSIS — C61 Malignant neoplasm of prostate: Secondary | ICD-10-CM | POA: Insufficient documentation

## 2013-07-30 DIAGNOSIS — E785 Hyperlipidemia, unspecified: Secondary | ICD-10-CM

## 2013-07-30 DIAGNOSIS — E559 Vitamin D deficiency, unspecified: Secondary | ICD-10-CM

## 2013-07-30 NOTE — Assessment & Plan Note (Signed)
TLC with repeat lipids in 4-6 mos

## 2013-07-30 NOTE — Progress Notes (Signed)
   Subjective:    Patient ID: Marcus Crawford, male    DOB: December 03, 1950, 63 y.o.   MRN: [id number]  HPI A modified heart healthy diet is followed; exercise is seasonal. Family history is negative for premature coronary disease. Advanced cholesterol testing reveals  LDL goal is less than 100 ; ideally < 70 . To date no statin.  Low dose ASA not  Taken due to renal disease.  His most recent lipids reveal elevated trigger list was of 211; it has been as high as 329. 4 years ago he has triglyceride level LXI. As expected his HDL has dropped his triglycerides have risen. Presently his HDL level is 37.  He is on vitamin D 3 ,3000 international units daily; his level is low normal at 39. One year ago was 15. His nephrologist is not concerned about the high dose vitamin D  He had been on ramipril 2.5 mg daily for nephro protection. This was reduced at the recommendation of his nephrologist. He is actually taking ramipril 2.5 mg every other day at this time.  Review of Systems Specifically denied are  chest pain, palpitations, dyspnea, or claudication.   Significant abdominal symptoms, memory deficit, or myalgias not present.  Probiotic prostatectomy is being considered; he has a followup appointment with Dr. Alinda Money next week.       Objective:   Physical Exam Appears healthy and well-nourished & in no acute distress  No carotid bruits are present.No neck pain distention present at 10 - 15 degrees. Thyroid normal to palpation  Heart rhythm and rate are normal with no significant murmurs or gallops.  Chest is clear with no increased work of breathing  There is no evidence of aortic aneurysm or renal artery bruits  Abdomen soft with no organomegaly or masses. No HJR  No clubbing, cyanosis or edema present.  Pedal pulses are intact   No ischemic skin changes are present . Nails healthy with chronic fungal changes   Alert and oriented. Strength, tone, DTRs reflexes normal            Assessment & Plan:  See Current Assessment & Plan in Problem List under specific Diagnosis

## 2013-07-30 NOTE — Assessment & Plan Note (Signed)
No change unless 2.5 mg PILL available then 1/2 qd

## 2013-07-30 NOTE — Patient Instructions (Signed)
Please review Dr Nunzio Cory book Eat, [place] for best  dietary cholesterol information.  Cardiovascular exercise, this can be as simple a program as walking, is recommended 30-45 minutes 3-4 times per week.Take 6-8 weeks to build up to this level.  Please  schedule fasting Labs : Lipid Panel after 4 -6 months of dietary & exercise changes.

## 2013-07-30 NOTE — Assessment & Plan Note (Signed)
No change unless Dr Justin Mend directs

## 2013-07-31 ENCOUNTER — Telehealth: Payer: Self-pay | Admitting: Internal Medicine

## 2013-07-31 NOTE — Telephone Encounter (Signed)
Relevant patient education assigned to patient using Emmi. ° °

## 2013-08-04 DIAGNOSIS — Z8679 Personal history of other diseases of the circulatory system: Secondary | ICD-10-CM | POA: Diagnosis not present

## 2013-08-04 DIAGNOSIS — Z87448 Personal history of other diseases of urinary system: Secondary | ICD-10-CM | POA: Diagnosis not present

## 2013-08-04 DIAGNOSIS — C61 Malignant neoplasm of prostate: Secondary | ICD-10-CM | POA: Diagnosis not present

## 2013-08-13 ENCOUNTER — Other Ambulatory Visit: Payer: Self-pay | Admitting: Urology

## 2013-08-26 DIAGNOSIS — C61 Malignant neoplasm of prostate: Secondary | ICD-10-CM | POA: Diagnosis not present

## 2013-08-26 DIAGNOSIS — R279 Unspecified lack of coordination: Secondary | ICD-10-CM | POA: Diagnosis not present

## 2013-08-26 DIAGNOSIS — M6281 Muscle weakness (generalized): Secondary | ICD-10-CM | POA: Diagnosis not present

## 2013-08-30 DIAGNOSIS — C801 Malignant (primary) neoplasm, unspecified: Secondary | ICD-10-CM

## 2013-08-30 HISTORY — DX: Malignant (primary) neoplasm, unspecified: C80.1

## 2013-08-31 ENCOUNTER — Encounter (HOSPITAL_COMMUNITY): Payer: Self-pay | Admitting: Pharmacy Technician

## 2013-08-31 ENCOUNTER — Other Ambulatory Visit (HOSPITAL_COMMUNITY): Payer: Self-pay | Admitting: Urology

## 2013-08-31 NOTE — Patient Instructions (Addendum)
[place]  08/31/2013   Your procedure is scheduled on: Monday March 9th  Report to [facility] at 830 AM.  Call this number if you have problems the morning of surgery [phone]   Remember: [place], your blood type will dr drawn morning of surgery in short stay.  Do not eat food :After Midnight Saturday night, clear liquids all day Sunday 09-06-2013, no clear liquids after midnight.     Take these medicines the morning of surgery with A SIP OF WATER: aciphex                                SEE [facility] PREPARING FOR SURGERY SHEET             You may not have any metal on your body including hair pins and piercings  Do not wear jewelry, make-up.  Do not wear lotions, powders, or perfumes. No  Deodorant is to be worn.   Men may shave face and neck.  Do not bring valuables to the hospital. [facility].  Contacts, dentures or bridgework may not be worn into surgery.  Leave suitcase in the car. After surgery it may be brought to your room.  For patients admitted to the hospital, checkout time is 11:00 AM the day of discharge.    Please read over the following fact sheets that you were given: [facility] Preparing for surgery sheet,  incentive spirometer sheet, blood fact sheet.  Call Zelphia Cairo RN pre op nurse if needed [phone]    FAILURE TO FOLLOW THESE INSTRUCTIONS MAY RESULT IN THE CANCELLATION OF YOUR SURGERY.  PATIENT SIGNATURE___________________________________________  NURSE SIGNATURE_____________________________________________

## 2013-08-31 NOTE — Progress Notes (Signed)
ekg 12-29-12 epic

## 2013-09-01 ENCOUNTER — Ambulatory Visit (HOSPITAL_COMMUNITY)
Admission: RE | Admit: 2013-09-01 | Discharge: 2013-09-01 | Disposition: A | Discharge status: Home / Self Care | Payer: Medicare Other | Source: Ambulatory Visit | Attending: Urology | Admitting: Urology

## 2013-09-01 ENCOUNTER — Encounter (HOSPITAL_COMMUNITY)
Admission: RE | Admit: 2013-09-01 | Discharge: 2013-09-01 | Disposition: A | Discharge status: Home / Self Care | Payer: Medicare Other | Source: Ambulatory Visit | Attending: Urology | Admitting: Urology

## 2013-09-01 ENCOUNTER — Encounter (HOSPITAL_COMMUNITY): Payer: Self-pay

## 2013-09-01 DIAGNOSIS — Z01812 Encounter for preprocedural laboratory examination: Secondary | ICD-10-CM | POA: Insufficient documentation

## 2013-09-01 DIAGNOSIS — Z01818 Encounter for other preprocedural examination: Secondary | ICD-10-CM | POA: Diagnosis not present

## 2013-09-01 HISTORY — DX: Malignant (primary) neoplasm, unspecified: C80.1

## 2013-09-01 HISTORY — DX: Chronic kidney disease, stage 3 (moderate): N18.3

## 2013-09-01 HISTORY — DX: Unspecified hemorrhoids: K64.9

## 2013-09-01 HISTORY — DX: Chronic kidney disease, stage 3 unspecified: N18.30

## 2013-09-01 HISTORY — DX: Calculus of kidney: N20.0

## 2013-09-01 LAB — BASIC METABOLIC PANEL
BUN: 26 mg/dL — ABNORMAL HIGH (ref 6–23)
CALCIUM: 9.6 mg/dL (ref 8.4–10.5)
CO2: 24 meq/L (ref 19–32)
Chloride: 103 mEq/L (ref 96–112)
Creatinine, Ser: 1.64 mg/dL — ABNORMAL HIGH (ref 0.50–1.35)
GFR calc Af Amer: 49 mL/min — ABNORMAL LOW (ref >90)
GFR, EST NON AFRICAN AMERICAN: 42 mL/min — AB (ref >90)
Glucose, Bld: 95 mg/dL (ref 70–99)
POTASSIUM: 4.8 meq/L (ref 3.7–5.3)
SODIUM: 139 meq/L (ref 137–147)

## 2013-09-01 LAB — CBC
HCT: 40.9 % (ref 39.0–52.0)
Hemoglobin: 13.9 g/dL (ref 13.0–17.0)
MCH: 29.8 pg (ref 26.0–34.0)
MCHC: 34 g/dL (ref 30.0–36.0)
MCV: 87.8 fL (ref 78.0–100.0)
Platelets: 176 10*3/uL (ref 150–400)
RBC: 4.66 MIL/uL (ref 4.22–5.81)
RDW: 12.7 % (ref 11.5–15.5)
WBC: 4.3 10*3/uL (ref 4.0–10.5)

## 2013-09-01 NOTE — Progress Notes (Signed)
bmet results faxed by epic to dr Wynetta Emery borden

## 2013-09-02 NOTE — Progress Notes (Signed)
Patient called about magnesium citrate ordered for bowel prep, bottle says not to use if has kidney disease, patient instructed to call selita bradsher at dr borden office to ask dr borden  to ask if should use magnesium citrate for bowel prep.

## 2013-09-05 NOTE — H&P (Signed)
Chief Complaint Prostate Cancer    History of Present Illness Mr. Marcus Crawford is a 63 year old gentleman who was noted to have an elevated PSA of 4.16 prompting urologic referral and evaluation by Dr. Junious Silk. This resulted in a prostate needle biopsy on 03/04/13 which confirmed Gleason 3+3=6 adenocarcinoma of the prostate with 5 out of 12 biopsy cores positive for malignancy. He underwent an MRI of the prostate in November 2014 which demonstrated findings consistent with prostate cancer at the left lateral aspect of the prostate with a more questionable area located toward the right lateral mid gland. There were no findings to suggest extraprostatic extension, seminal vesicle involvement, or pelvic lymphadenopathy. He has a family history of prostate cancer with his brother having been diagnosed with prostate cancer - although he subsequently died of complications related to a myocardial infarction. He has been well counseled about his treatment options by Dr. Junious Silk and is most interested in proceeding with surgical therapy.  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 Ralene Ok.   His medical comorbidities include a history of CKD (baseline Cr 1.56), HTN, GERD, and 1st degree AV block.  TNM stage: cT1c N0 Mx PSA: 3.50 (rechecked in Jan 2015) Gleason score: 3+3=6 Biopsy (03/04/13): 5/12 cores positive   Left: L lateral apex (5%), L lateral mid (40%), L mid (20%), L lateral base (60%, PNI), L base (25%)   Right: R lateral base (< 5%) Prostate volume: 16 cc  Nomogram OC disease: 76% EPE: 19% SVI: 2% LNI: 2% PFS (surgery): 95% at 5 years and 91% at 10 years  Urinary function: He does have mild-to-moderate lower urinary tract symptoms. These include urinary  frequency, weak stream, and nocturia. IPSS is 9.  Erectile function: He does have very mild erectile dysfunction although has not required therapy. SHIM score is 21.   Past Medical History Problems  1. History of chronic kidney disease (V13.09) 2. History of esophageal reflux (V12.79) 3. History of first degree atrioventricular block (V12.59) 4. History of hypertension (V12.59)  Surgical History Problems  1. History of Appendectomy 2. History of Laparoscopy With Adrenalectomy  Current Meds 1. Aciphex TBEC;  Therapy: (Recorded:13Sep2013) to Recorded 2. Metamucil POWD;  Therapy: (Recorded:11Nov2014) to Recorded 3. Vitamin D2 TABS;  Therapy: (Recorded:22Jul2014) to Recorded  Allergies Medication  1. Sulfa Drugs 2. Penicillins  His allergy to penicillin rash/hives. He cannot remember his allergic reaction to sulfa drugs.   Family History Problems  1. Family history of Acute Myocardial Infarction (V17.3) : Mother 2. Family history of Cerebral Artery Aneurysm : Father 3. Family history of Death In The Family Mother : Mother   76- MI 98. Family history of Nephrectomy : Brother 5. Family history of Prostate Cancer (Z56.38) : Brother 6. Family history of Renal Cell Carcinoma (V16.51) : Brother  His brother was diagnosed with prostate cancer in his seventies and underwent radiation seed implantation. His brother did develop metastatic disease subsequently but did not die of prostate cancer.   Social History Problems    Alcohol Use   2-3 per wk   Marital History - Currently Married   Never A Smoker   Retired From Work  Review of Systems Constitutional, skin, eye, otolaryngeal, hematologic/lymphatic, cardiovascular, pulmonary, endocrine, musculoskeletal, gastrointestinal, neurological and psychiatric system(s) were reviewed and pertinent  findings if present are noted.  Cardiovascular: no chest pain.  Respiratory: no shortness of breath.    Vitals  Height: 6 ft 1  in Weight: 225 lb  BMI Calculated: 29.69 BSA Calculated: 2.26   Physical Exam Constitutional: Well nourished and well developed . No acute distress.  ENT:. The ears and nose are normal in appearance.  Neck: The appearance of the neck is normal and no neck mass is present.  Pulmonary: No respiratory distress, normal respiratory rhythm and effort and clear bilateral breath sounds.  Cardiovascular: Heart rate and rhythm are normal . No peripheral edema.  Abdomen: The abdomen is soft and nontender. No masses are palpated. No CVA tenderness. No hernias are palpable. No hepatosplenomegaly noted.     Assessment Assessed  1.  Prostate cancer (185)    Discussion/Summary 1. Prostate cancer: I did recommend therapy of curative intent. He has decided to proceed with surgical therapy and will undergo a bilateral nerve sparing robotic-assisted laparoscopic radical prostatectomy.

## 2013-09-07 ENCOUNTER — Inpatient Hospital Stay (HOSPITAL_COMMUNITY)
Admission: RE | Admit: 2013-09-07 | Discharge: 2013-09-08 | DRG: 708 | Disposition: A | Discharge status: Home / Self Care | Payer: Medicare Other | Source: Ambulatory Visit | Attending: Urology | Admitting: Urology

## 2013-09-07 ENCOUNTER — Encounter (HOSPITAL_COMMUNITY): Payer: Medicare Other | Admitting: Anesthesiology

## 2013-09-07 ENCOUNTER — Encounter (HOSPITAL_COMMUNITY)
Admission: RE | Disposition: A | Discharge status: Home / Self Care | Payer: Self-pay | Source: Ambulatory Visit | Attending: Urology

## 2013-09-07 ENCOUNTER — Encounter (HOSPITAL_COMMUNITY): Payer: Self-pay | Admitting: *Deleted

## 2013-09-07 ENCOUNTER — Inpatient Hospital Stay (HOSPITAL_COMMUNITY): Payer: Medicare Other | Admitting: Anesthesiology

## 2013-09-07 DIAGNOSIS — Z8051 Family history of malignant neoplasm of kidney: Secondary | ICD-10-CM

## 2013-09-07 DIAGNOSIS — Z880 Allergy status to penicillin: Secondary | ICD-10-CM | POA: Diagnosis not present

## 2013-09-07 DIAGNOSIS — Z8249 Family history of ischemic heart disease and other diseases of the circulatory system: Secondary | ICD-10-CM | POA: Diagnosis not present

## 2013-09-07 DIAGNOSIS — Z79899 Other long term (current) drug therapy: Secondary | ICD-10-CM

## 2013-09-07 DIAGNOSIS — K219 Gastro-esophageal reflux disease without esophagitis: Secondary | ICD-10-CM | POA: Diagnosis present

## 2013-09-07 DIAGNOSIS — D126 Benign neoplasm of colon, unspecified: Secondary | ICD-10-CM | POA: Diagnosis not present

## 2013-09-07 DIAGNOSIS — Z23 Encounter for immunization: Secondary | ICD-10-CM

## 2013-09-07 DIAGNOSIS — N189 Chronic kidney disease, unspecified: Secondary | ICD-10-CM | POA: Diagnosis present

## 2013-09-07 DIAGNOSIS — C61 Malignant neoplasm of prostate: Secondary | ICD-10-CM | POA: Diagnosis not present

## 2013-09-07 DIAGNOSIS — I129 Hypertensive chronic kidney disease with stage 1 through stage 4 chronic kidney disease, or unspecified chronic kidney disease: Secondary | ICD-10-CM | POA: Diagnosis present

## 2013-09-07 DIAGNOSIS — Z8042 Family history of malignant neoplasm of prostate: Secondary | ICD-10-CM

## 2013-09-07 DIAGNOSIS — K5730 Diverticulosis of large intestine without perforation or abscess without bleeding: Secondary | ICD-10-CM | POA: Diagnosis not present

## 2013-09-07 HISTORY — PX: ROBOT ASSISTED LAPAROSCOPIC RADICAL PROSTATECTOMY: SHX5141

## 2013-09-07 LAB — HEMOGLOBIN AND HEMATOCRIT, BLOOD
HCT: 38.5 % — ABNORMAL LOW (ref 39.0–52.0)
Hemoglobin: 13.1 g/dL (ref 13.0–17.0)

## 2013-09-07 LAB — TYPE AND SCREEN
ABO/RH(D): B POS
Antibody Screen: NEGATIVE

## 2013-09-07 LAB — ABO/RH: ABO/RH(D): B POS

## 2013-09-07 SURGERY — ROBOTIC ASSISTED LAPAROSCOPIC RADICAL PROSTATECTOMY LEVEL 1
Anesthesia: General | Site: Abdomen

## 2013-09-07 MED ORDER — EPHEDRINE SULFATE 50 MG/ML IJ SOLN
INTRAMUSCULAR | Status: DC | PRN
  Administered 2013-09-07: 10 mg via INTRAVENOUS

## 2013-09-07 MED ORDER — ONDANSETRON HCL 4 MG/2ML IJ SOLN
INTRAMUSCULAR | Status: AC
  Filled 2013-09-07: qty 2

## 2013-09-07 MED ORDER — PROMETHAZINE HCL 25 MG/ML IJ SOLN
6.2500 mg | INTRAMUSCULAR | Status: DC | PRN
  Administered 2013-09-07: 6.25 mg via INTRAVENOUS

## 2013-09-07 MED ORDER — PNEUMOCOCCAL VAC POLYVALENT 25 MCG/0.5ML IJ INJ
0.5000 mL | INJECTION | INTRAMUSCULAR | Status: AC
  Administered 2013-09-08: 0.5 mL via INTRAMUSCULAR
  Filled 2013-09-07 (×3): qty 0.5

## 2013-09-07 MED ORDER — KCL IN DEXTROSE-NACL 20-5-0.45 MEQ/L-%-% IV SOLN
INTRAVENOUS | Status: DC
  Administered 2013-09-07: via INTRAVENOUS
  Administered 2013-09-07 (×2): 1000 mL via INTRAVENOUS
  Administered 2013-09-08: 06:00:00 via INTRAVENOUS
  Filled 2013-09-07 (×4): qty 1000

## 2013-09-07 MED ORDER — LACTATED RINGERS IV SOLN
INTRAVENOUS | Status: DC
  Administered 2013-09-07: 13:00:00 via INTRAVENOUS
  Administered 2013-09-07: 1000 mL via INTRAVENOUS

## 2013-09-07 MED ORDER — NEOSTIGMINE METHYLSULFATE 1 MG/ML IJ SOLN
INTRAMUSCULAR | Status: AC
  Filled 2013-09-07: qty 10

## 2013-09-07 MED ORDER — CISATRACURIUM BESYLATE 20 MG/10ML IV SOLN
INTRAVENOUS | Status: AC
  Filled 2013-09-07: qty 10

## 2013-09-07 MED ORDER — CEFAZOLIN SODIUM 1-5 GM-% IV SOLN
1.0000 g | Freq: Three times a day (TID) | INTRAVENOUS | Status: AC
  Administered 2013-09-07 – 2013-09-08 (×2): 1 g via INTRAVENOUS
  Filled 2013-09-07 (×2): qty 50

## 2013-09-07 MED ORDER — SODIUM CHLORIDE 0.9 % IJ SOLN
INTRAMUSCULAR | Status: AC
  Filled 2013-09-07: qty 10

## 2013-09-07 MED ORDER — CISATRACURIUM BESYLATE (PF) 10 MG/5ML IV SOLN
INTRAVENOUS | Status: DC | PRN
  Administered 2013-09-07: 4 mg via INTRAVENOUS
  Administered 2013-09-07: 12 mg via INTRAVENOUS

## 2013-09-07 MED ORDER — HYDROCODONE-ACETAMINOPHEN 5-325 MG PO TABS: 1.0000 | ORAL_TABLET | Freq: Four times a day (QID) | ORAL | Status: DC | PRN

## 2013-09-07 MED ORDER — SUFENTANIL CITRATE 50 MCG/ML IV SOLN
INTRAVENOUS | Status: DC | PRN
  Administered 2013-09-07 (×2): 10 ug via INTRAVENOUS
  Administered 2013-09-07: 20 ug via INTRAVENOUS
  Administered 2013-09-07: 10 ug via INTRAVENOUS

## 2013-09-07 MED ORDER — DOCUSATE SODIUM 100 MG PO CAPS
100.0000 mg | ORAL_CAPSULE | Freq: Two times a day (BID) | ORAL | Status: DC
  Administered 2013-09-07 – 2013-09-08 (×2): 100 mg via ORAL
  Filled 2013-09-07 (×3): qty 1

## 2013-09-07 MED ORDER — NEOSTIGMINE METHYLSULFATE 1 MG/ML IJ SOLN
INTRAMUSCULAR | Status: DC | PRN
  Administered 2013-09-07: 4 mg via INTRAVENOUS

## 2013-09-07 MED ORDER — ACETAMINOPHEN 325 MG PO TABS
650.0000 mg | ORAL_TABLET | ORAL | Status: DC | PRN
  Administered 2013-09-07: 650 mg via ORAL
  Filled 2013-09-07 (×2): qty 2

## 2013-09-07 MED ORDER — DEXAMETHASONE SODIUM PHOSPHATE 10 MG/ML IJ SOLN
INTRAMUSCULAR | Status: DC | PRN
  Administered 2013-09-07: 10 mg via INTRAVENOUS

## 2013-09-07 MED ORDER — SUFENTANIL CITRATE 50 MCG/ML IV SOLN
INTRAVENOUS | Status: AC
  Filled 2013-09-07: qty 1

## 2013-09-07 MED ORDER — HYDROMORPHONE HCL PF 1 MG/ML IJ SOLN
0.2500 mg | INTRAMUSCULAR | Status: DC | PRN
  Administered 2013-09-07: 0.25 mg via INTRAVENOUS

## 2013-09-07 MED ORDER — DIPHENHYDRAMINE HCL 12.5 MG/5ML PO ELIX: 12.5000 mg | ORAL_SOLUTION | Freq: Four times a day (QID) | ORAL | Status: DC | PRN

## 2013-09-07 MED ORDER — ONDANSETRON HCL 4 MG/2ML IJ SOLN
INTRAMUSCULAR | Status: DC | PRN
  Administered 2013-09-07: 4 mg via INTRAVENOUS

## 2013-09-07 MED ORDER — MIDAZOLAM HCL 2 MG/2ML IJ SOLN
INTRAMUSCULAR | Status: AC
  Filled 2013-09-07: qty 2

## 2013-09-07 MED ORDER — SUCCINYLCHOLINE CHLORIDE 20 MG/ML IJ SOLN
INTRAMUSCULAR | Status: DC | PRN
  Administered 2013-09-07: 100 mg via INTRAVENOUS

## 2013-09-07 MED ORDER — PROPOFOL 10 MG/ML IV BOLUS
INTRAVENOUS | Status: AC
  Filled 2013-09-07: qty 20

## 2013-09-07 MED ORDER — DIPHENHYDRAMINE HCL 50 MG/ML IJ SOLN: 12.5000 mg | Freq: Four times a day (QID) | INTRAMUSCULAR | Status: DC | PRN

## 2013-09-07 MED ORDER — SODIUM CHLORIDE 0.9 % IV BOLUS (SEPSIS)
1000.0000 mL | Freq: Once | INTRAVENOUS | Status: AC
  Administered 2013-09-07: 1000 mL via INTRAVENOUS

## 2013-09-07 MED ORDER — LACTATED RINGERS IV SOLN: INTRAVENOUS | Status: DC

## 2013-09-07 MED ORDER — CEFAZOLIN SODIUM-DEXTROSE 2-3 GM-% IV SOLR
INTRAVENOUS | Status: AC
  Filled 2013-09-07: qty 50

## 2013-09-07 MED ORDER — HEPARIN SODIUM (PORCINE) 1000 UNIT/ML IJ SOLN
INTRAMUSCULAR | Status: AC
  Filled 2013-09-07: qty 1

## 2013-09-07 MED ORDER — PROMETHAZINE HCL 25 MG/ML IJ SOLN
INTRAMUSCULAR | Status: AC
  Filled 2013-09-07: qty 1

## 2013-09-07 MED ORDER — SCOPOLAMINE 1 MG/3DAYS TD PT72
MEDICATED_PATCH | TRANSDERMAL | Status: DC | PRN
  Administered 2013-09-07: 1 via TRANSDERMAL

## 2013-09-07 MED ORDER — LIDOCAINE HCL (CARDIAC) 20 MG/ML IV SOLN
INTRAVENOUS | Status: AC
  Filled 2013-09-07: qty 5

## 2013-09-07 MED ORDER — HYDROMORPHONE HCL PF 1 MG/ML IJ SOLN
INTRAMUSCULAR | Status: AC
  Filled 2013-09-07: qty 1

## 2013-09-07 MED ORDER — MIDAZOLAM HCL 5 MG/5ML IJ SOLN
INTRAMUSCULAR | Status: DC | PRN
  Administered 2013-09-07: 2 mg via INTRAVENOUS

## 2013-09-07 MED ORDER — PROMETHAZINE HCL 25 MG/ML IJ SOLN: 12.5000 mg | INTRAMUSCULAR | Status: DC | PRN

## 2013-09-07 MED ORDER — PROPOFOL 10 MG/ML IV BOLUS
INTRAVENOUS | Status: DC | PRN
  Administered 2013-09-07: 200 mg via INTRAVENOUS

## 2013-09-07 MED ORDER — CIPROFLOXACIN HCL 500 MG PO TABS: 500.0000 mg | ORAL_TABLET | Freq: Two times a day (BID) | ORAL | Status: DC

## 2013-09-07 MED ORDER — MORPHINE SULFATE 2 MG/ML IJ SOLN: 2.0000 mg | INTRAMUSCULAR | Status: DC | PRN

## 2013-09-07 MED ORDER — GLYCOPYRROLATE 0.2 MG/ML IJ SOLN
INTRAMUSCULAR | Status: AC
  Filled 2013-09-07: qty 3

## 2013-09-07 MED ORDER — LIDOCAINE HCL (CARDIAC) 20 MG/ML IV SOLN
INTRAVENOUS | Status: DC | PRN
  Administered 2013-09-07: 100 mg via INTRAVENOUS

## 2013-09-07 MED ORDER — BUPIVACAINE-EPINEPHRINE 0.25% -1:200000 IJ SOLN
INTRAMUSCULAR | Status: DC | PRN
  Administered 2013-09-07: 30 mL

## 2013-09-07 MED ORDER — GLYCOPYRROLATE 0.2 MG/ML IJ SOLN
INTRAMUSCULAR | Status: DC | PRN
  Administered 2013-09-07: 0.6 mg via INTRAVENOUS

## 2013-09-07 MED ORDER — HEMOSTATIC AGENTS (NO CHARGE) OPTIME
TOPICAL | Status: DC | PRN
  Administered 2013-09-07: 1 via TOPICAL

## 2013-09-07 MED ORDER — SCOPOLAMINE 1 MG/3DAYS TD PT72
MEDICATED_PATCH | TRANSDERMAL | Status: AC
  Filled 2013-09-07: qty 1

## 2013-09-07 MED ORDER — KCL IN DEXTROSE-NACL 20-5-0.45 MEQ/L-%-% IV SOLN
INTRAVENOUS | Status: AC
  Administered 2013-09-07: 1000 mL via INTRAVENOUS
  Filled 2013-09-07: qty 1000

## 2013-09-07 MED ORDER — METOCLOPRAMIDE HCL 5 MG/ML IJ SOLN
INTRAMUSCULAR | Status: DC | PRN
  Administered 2013-09-07: 10 mg via INTRAVENOUS

## 2013-09-07 MED ORDER — HEPARIN SODIUM (PORCINE) 1000 UNIT/ML IJ SOLN
INTRAMUSCULAR | Status: DC | PRN
  Administered 2013-09-07: 12:00:00

## 2013-09-07 MED ORDER — STERILE WATER FOR IRRIGATION IR SOLN
Status: DC | PRN
  Administered 2013-09-07: 3000 mL

## 2013-09-07 MED ORDER — BUPIVACAINE-EPINEPHRINE PF 0.25-1:200000 % IJ SOLN
INTRAMUSCULAR | Status: AC
  Filled 2013-09-07: qty 30

## 2013-09-07 MED ORDER — PANTOPRAZOLE SODIUM 40 MG PO TBEC
40.0000 mg | DELAYED_RELEASE_TABLET | Freq: Every day | ORAL | Status: DC
  Administered 2013-09-08: 40 mg via ORAL
  Filled 2013-09-07: qty 1

## 2013-09-07 MED ORDER — CEFAZOLIN SODIUM-DEXTROSE 2-3 GM-% IV SOLR
2.0000 g | INTRAVENOUS | Status: AC
  Administered 2013-09-07: 2 g via INTRAVENOUS

## 2013-09-07 MED ORDER — DEXAMETHASONE SODIUM PHOSPHATE 10 MG/ML IJ SOLN
INTRAMUSCULAR | Status: AC
  Filled 2013-09-07: qty 1

## 2013-09-07 SURGICAL SUPPLY — 47 items
ADH SKN CLS APL DERMABOND .7 (GAUZE/BANDAGES/DRESSINGS) ×1
CABLE HIGH FREQUENCY MONO STRZ (ELECTRODE) ×3 IMPLANT
CANISTER SUCTION 2500CC (MISCELLANEOUS) ×1 IMPLANT
CATH FOLEY 2WAY SLVR 18FR 30CC (CATHETERS) ×3 IMPLANT
CATH ROBINSON RED A/P 16FR (CATHETERS) ×3 IMPLANT
CATH ROBINSON RED A/P 8FR (CATHETERS) ×3 IMPLANT
CATH TIEMANN FOLEY 18FR 5CC (CATHETERS) ×3 IMPLANT
CHLORAPREP W/TINT 26ML (MISCELLANEOUS) ×3 IMPLANT
CLIP LIGATING HEM O LOK PURPLE (MISCELLANEOUS) ×2 IMPLANT
CLOTH BEACON ORANGE TIMEOUT ST (SAFETY) ×3 IMPLANT
COVER SURGICAL LIGHT HANDLE (MISCELLANEOUS) ×3 IMPLANT
COVER TIP SHEARS 8 DVNC (MISCELLANEOUS) ×1 IMPLANT
COVER TIP SHEARS 8MM DA VINCI (MISCELLANEOUS) ×2
CUTTER ECHEON FLEX ENDO 45 340 (ENDOMECHANICALS) ×3 IMPLANT
DECANTER SPIKE VIAL GLASS SM (MISCELLANEOUS) ×1 IMPLANT
DERMABOND ADVANCED (GAUZE/BANDAGES/DRESSINGS) ×2
DERMABOND ADVANCED .7 DNX12 (GAUZE/BANDAGES/DRESSINGS) IMPLANT
DRAPE SURG IRRIG POUCH 19X23 (DRAPES) ×3 IMPLANT
DRSG TEGADERM 4X4.75 (GAUZE/BANDAGES/DRESSINGS) ×5 IMPLANT
DRSG TEGADERM 6X8 (GAUZE/BANDAGES/DRESSINGS) ×6 IMPLANT
ELECT REM PT RETURN 9FT ADLT (ELECTROSURGICAL) ×3
ELECTRODE REM PT RTRN 9FT ADLT (ELECTROSURGICAL) ×1 IMPLANT
GLOVE BIO SURGEON STRL SZ 6.5 (GLOVE) ×2 IMPLANT
GLOVE BIO SURGEONS STRL SZ 6.5 (GLOVE) ×1
GLOVE BIOGEL M STRL SZ7.5 (GLOVE) ×6 IMPLANT
GOWN STRL REUS W/TWL LRG LVL3 (GOWN DISPOSABLE) ×6 IMPLANT
GOWN STRL REUS W/TWL XL LVL3 (GOWN DISPOSABLE) ×3 IMPLANT
HEMOSTAT SURGICEL 4X8 (HEMOSTASIS) ×2 IMPLANT
HOLDER FOLEY CATH W/STRAP (MISCELLANEOUS) ×3 IMPLANT
IV LACTATED RINGERS 1000ML (IV SOLUTION) ×1 IMPLANT
KIT ACCESSORY DA VINCI DISP (KITS) ×2
KIT ACCESSORY DVNC DISP (KITS) ×1 IMPLANT
NDL SAFETY ECLIPSE 18X1.5 (NEEDLE) ×1 IMPLANT
NEEDLE HYPO 18GX1.5 SHARP (NEEDLE) ×3
PACK ROBOT UROLOGY CUSTOM (CUSTOM PROCEDURE TRAY) ×3 IMPLANT
RELOAD GREEN ECHELON 45 (STAPLE) ×3 IMPLANT
SET TUBE IRRIG SUCTION NO TIP (IRRIGATION / IRRIGATOR) ×3 IMPLANT
SOLUTION ELECTROLUBE (MISCELLANEOUS) ×3 IMPLANT
SUT ETHILON 3 0 PS 1 (SUTURE) ×3 IMPLANT
SUT MNCRL AB 4-0 PS2 18 (SUTURE) ×6 IMPLANT
SUT VICRYL 0 UR6 27IN ABS (SUTURE) ×6 IMPLANT
SYR 27GX1/2 1ML LL SAFETY (SYRINGE) ×3 IMPLANT
TOWEL OR 17X26 10 PK STRL BLUE (TOWEL DISPOSABLE) ×3 IMPLANT
TOWEL OR NON WOVEN STRL DISP B (DISPOSABLE) ×3 IMPLANT
TROCAR DISP BLADELESS 8 DVNC (TROCAR) IMPLANT
TROCAR DISP BLADELESS 8MM (TROCAR) ×2
WATER STERILE IRR 1500ML POUR (IV SOLUTION) ×2 IMPLANT

## 2013-09-07 NOTE — Progress Notes (Signed)
Utilization review completed.  

## 2013-09-07 NOTE — Discharge Instructions (Signed)

## 2013-09-07 NOTE — Anesthesia Postprocedure Evaluation (Signed)
  Anesthesia Post-op Note  Patient: Marcus Crawford  Procedure(s) Performed: Procedure(s) (LRB): ROBOTIC ASSISTED LAPAROSCOPIC RADICAL PROSTATECTOMY LEVEL 1 (N/A)  Patient Location: PACU  Anesthesia Type: General  Level of Consciousness: awake and alert   Airway and Oxygen Therapy: Patient Spontanous Breathing  Post-op Pain: mild  Post-op Assessment: Post-op Vital signs reviewed, Patient's Cardiovascular Status Stable, Respiratory Function Stable, Patent Airway and No signs of Nausea or vomiting  Last Vitals:  Filed Vitals:   09/07/13 1515  BP: 138/74  Pulse: 52  Temp: 36.5 C  Resp: 11    Post-op Vital Signs: stable   Complications: No apparent anesthesia complications

## 2013-09-07 NOTE — Anesthesia Preprocedure Evaluation (Addendum)
Anesthesia Evaluation  Patient identified by MRN, date of birth, ID band Patient awake    Reviewed: Allergy & Precautions, H&P , NPO status , Patient's Chart, lab work & pertinent test results  History of Anesthesia Complications (+) PONV  Airway Mallampati: II TM Distance: >3 FB Neck ROM: full    Dental  (+) Dental Advisory Given, Implants Implant right upper front:   Pulmonary neg pulmonary ROS,  breath sounds clear to auscultation  Pulmonary exam normal       Cardiovascular Exercise Tolerance: Good hypertension, Pt. on medications Rhythm:regular Rate:Normal     Neuro/Psych negative neurological ROS  negative psych ROS   GI/Hepatic negative GI ROS, Neg liver ROS, hiatal hernia, GERD-  Medicated and Controlled,  Endo/Other  negative endocrine ROSPrimary hyperaldosteronism  Renal/GU Renal diseaseStage 3 chronic kidney disease. Cr 1.64  negative genitourinary   Musculoskeletal   Abdominal   Peds  Hematology negative hematology ROS (+)   Anesthesia Other Findings   Reproductive/Obstetrics negative OB ROS                          Anesthesia Physical Anesthesia Plan  ASA: III  Anesthesia Plan: General   Post-op Pain Management:    Induction: Intravenous  Airway Management Planned: Oral ETT  Additional Equipment:   Intra-op Plan:   Post-operative Plan: Extubation in OR  Informed Consent: I have reviewed the patients History and Physical, chart, labs and discussed the procedure including the risks, benefits and alternatives for the proposed anesthesia with the patient or authorized representative who has indicated his/her understanding and acceptance.   Dental Advisory Given  Plan Discussed with: CRNA and Surgeon  Anesthesia Plan Comments:         Anesthesia Quick Evaluation

## 2013-09-07 NOTE — Preoperative (Signed)
Beta Blockers   Reason not to administer Beta Blockers:Not Applicable 

## 2013-09-07 NOTE — Transfer of Care (Signed)
Immediate Anesthesia Transfer of Care Note  Patient: Marcus Crawford  Procedure(s) Performed: Procedure(s): ROBOTIC ASSISTED LAPAROSCOPIC RADICAL PROSTATECTOMY LEVEL 1 (N/A)  Patient Location: PACU  Anesthesia Type:General  Level of Consciousness: awake, alert  and oriented  Airway & Oxygen Therapy: Patient Spontanous Breathing and Patient connected to face mask oxygen  Post-op Assessment: Report given to PACU RN and Post -op Vital signs reviewed and stable  Post vital signs: Reviewed and stable  Complications: No apparent anesthesia complications

## 2013-09-07 NOTE — Anesthesia Procedure Notes (Signed)
Procedure Name: Intubation Date/Time: 09/07/2013 11:29 AM Performed by: Danley Danker L Patient Re-evaluated:Patient Re-evaluated prior to inductionOxygen Delivery Method: Circle system utilized Preoxygenation: Pre-oxygenation with 100% oxygen Intubation Type: IV induction Ventilation: Mask ventilation without difficulty and Oral airway inserted - appropriate to patient size Laryngoscope Size: Miller and 3 Grade View: Grade I Tube type: Oral Tube size: 8.0 mm Number of attempts: 1 Airway Equipment and Method: Stylet Placement Confirmation: ETT inserted through vocal cords under direct vision,  positive ETCO2 and breath sounds checked- equal and bilateral Secured at: 21 cm Tube secured with: Tape Dental Injury: Teeth and Oropharynx as per pre-operative assessment

## 2013-09-07 NOTE — Progress Notes (Signed)
Patient ID: Marcus Crawford, male   DOB: Nov 14, 1950, 63 y.o.   MRN: [id number]  Post-op note  Subjective: The patient is doing well.  No complaints.  Objective: Vital signs in last 24 hours: Temp:  [97.5 F (36.4 C)-98.4 F (36.9 C)] 98.2 F (36.8 C) (03/09 2143) Pulse Rate:  [47-83] 83 (03/09 2143) Resp:  [9-18] 18 (03/09 2143) BP: (112-144)/(57-83) 112/57 mmHg (03/09 2143) SpO2:  [94 %-100 %] 96 % (03/09 2143) Weight:  [101.606 kg (224 lb)] 101.606 kg (224 lb) (03/09 1155)  Intake/Output from previous day:   Intake/Output this shift: Total I/O In: 480 [P.O.:480] Out: 800 [Urine:800]  Physical Exam:  General: Alert and oriented. Abdomen: Soft, Nondistended. Incisions: Clean and dry. Urine: Clear.  Lab Results:  Recent Labs  09/07/13 1441  HGB 13.1  HCT 38.5*    Assessment/Plan: POD#0   1) Continue to monitor 2) Ambulate   Pryor Curia. MD   LOS: 0 days   Delmon Andrada,LES 09/07/2013, 10:13 PM

## 2013-09-07 NOTE — Op Note (Signed)
Preoperative diagnosis: Clinically localized adenocarcinoma of the prostate (clinical stage T1c Nx Mx)  Postoperative diagnosis: Clinically localized adenocarcinoma of the prostate (clinical stage T1c Nx Mx)  Procedure:  1. Robotic assisted laparoscopic radical prostatectomy (bilateral nerve sparing)  Surgeon: Roxy Horseman, Brooke Bonito. M.D.  Assistant: Leta Baptist, PA-C  Anesthesia: General  Complications: None  EBL: 100 mL  IVF:  1200 mL crystalloid  Specimens: 1. Prostate and seminal vesicles  Disposition of specimens: Pathology  Drains: 1. 20 Fr coude catheter 2. # 19 Blake pelvic drain  Indication: Marcus Crawford is a 63 y.o. year old patient with clinically localized prostate cancer.  After a thorough review of the management options for treatment of prostate cancer, he elected to proceed with surgical therapy and the above procedure(s).  We have discussed the potential benefits and risks of the procedure, side effects of the proposed treatment, the likelihood of the patient achieving the goals of the procedure, and any potential problems that might occur during the procedure or recuperation. Informed consent has been obtained.  Description of procedure:  The patient was taken to the operating room and a general anesthetic was administered. He was given preoperative antibiotics, placed in the dorsal lithotomy position, and prepped and draped in the usual sterile fashion. Next a preoperative timeout was performed. A urethral catheter was placed into the bladder and a site was selected near the umbilicus for placement of the camera port. This was placed using a standard open Hassan technique which allowed entry into the peritoneal cavity under direct vision and without difficulty. A 12 mm port was placed and a pneumoperitoneum established. The camera was then used to inspect the abdomen and there was no evidence of any intra-abdominal injuries or other abnormalities. The  remaining abdominal ports were then placed. 8 mm robotic ports were placed in the right lower quadrant, left lower quadrant, and far left lateral abdominal wall. A 5 mm port was placed in the right upper quadrant and a 12 mm port was placed in the right lateral abdominal wall for laparoscopic assistance. All ports were placed under direct vision without difficulty. The surgical cart was then docked.   Utilizing the cautery scissors, the bladder was reflected posteriorly allowing entry into the space of Retzius and identification of the endopelvic fascia and prostate. The periprostatic fat was then removed from the prostate allowing full exposure of the endopelvic fascia. The endopelvic fascia was then incised from the apex back to the base of the prostate bilaterally and the underlying levator muscle fibers were swept laterally off the prostate thereby isolating the dorsal venous complex. The dorsal vein was then stapled and divided with a 45 mm Flex Echelon stapler. Attention then turned to the bladder neck which was divided anteriorly thereby allowing entry into the bladder and exposure of the urethral catheter. The catheter balloon was deflated and the catheter was brought into the operative field and used to retract the prostate anteriorly. The posterior bladder neck was then examined and was divided allowing further dissection between the bladder and prostate posteriorly until the vasa deferentia and seminal vessels were identified. The vasa deferentia were isolated, divided, and lifted anteriorly. The seminal vesicles were dissected down to their tips with care to control the seminal vascular arterial blood supply. These structures were then lifted anteriorly and the space between Denonvillier's fascia and the anterior rectum was developed with a combination of sharp and blunt dissection. This isolated the vascular pedicles of the prostate.  The lateral prostatic  fascia was then sharply incised allowing  release of the neurovascular bundles bilaterally. The vascular pedicles of the prostate were then ligated with Weck clips between the prostate and neurovascular bundles and divided with sharp cold scissor dissection resulting in neurovascular bundle preservation. The neurovascular bundles were then separated off the apex of the prostate and urethra bilaterally.  The urethra was then sharply transected allowing the prostate specimen to be disarticulated. The pelvis was copiously irrigated and hemostasis was ensured. There was no evidence for rectal injury.  Attention then turned to the urethral anastomosis. A 2-0 Vicryl slip knot was placed between Denonvillier's fascia, the posterior bladder neck, and the posterior urethra to reapproximate these structures. A double-armed 3-0 Monocryl suture was then used to perform a 360 running tension-free anastomosis between the bladder neck and urethra. A new urethral catheter was then placed into the bladder and irrigated. There were no blood clots within the bladder and the anastomosis appeared to be watertight. A #19 Blake drain was then brought through the left lateral 8 mm port site and positioned appropriately within the pelvis. It was secured to the skin with a nylon suture. The surgical cart was then undocked. The right lateral 12 mm port site was closed at the fascial level with a 0 Vicryl suture placed laparoscopically. All remaining ports were then removed under direct vision. The prostate specimen was removed intact within the Endopouch retrieval bag via the periumbilical camera port site. This fascial opening was closed with two running 0 Vicryl sutures. 0.25% Marcaine was then injected into all port sites and all incisions were reapproximated at the skin level with 4-0 Monocryl subcuticular sutures and Dermabond. The patient appeared to tolerate the procedure well and without complications. The patient was able to be extubated and transferred to the recovery  unit in satisfactory condition.  Pryor Curia MD

## 2013-09-08 ENCOUNTER — Encounter (HOSPITAL_COMMUNITY): Payer: Self-pay | Admitting: Urology

## 2013-09-08 LAB — BASIC METABOLIC PANEL
BUN: 18 mg/dL (ref 6–23)
CALCIUM: 8.7 mg/dL (ref 8.4–10.5)
CO2: 23 meq/L (ref 19–32)
CREATININE: 1.45 mg/dL — AB (ref 0.50–1.35)
Chloride: 103 mEq/L (ref 96–112)
GFR calc Af Amer: 57 mL/min — ABNORMAL LOW (ref >90)
GFR calc non Af Amer: 49 mL/min — ABNORMAL LOW (ref >90)
GLUCOSE: 169 mg/dL — AB (ref 70–99)
Potassium: 5.1 mEq/L (ref 3.7–5.3)
Sodium: 138 mEq/L (ref 137–147)

## 2013-09-08 LAB — HEMOGLOBIN AND HEMATOCRIT, BLOOD
HEMATOCRIT: 38.7 % — AB (ref 39.0–52.0)
Hemoglobin: 13 g/dL (ref 13.0–17.0)

## 2013-09-08 MED ORDER — BISACODYL 10 MG RE SUPP
10.0000 mg | Freq: Once | RECTAL | Status: AC
  Administered 2013-09-08: 10 mg via RECTAL
  Filled 2013-09-08: qty 1

## 2013-09-08 MED ORDER — HYDROCODONE-ACETAMINOPHEN 5-325 MG PO TABS
1.0000 | ORAL_TABLET | Freq: Four times a day (QID) | ORAL | Status: DC | PRN
  Administered 2013-09-08: 1 via ORAL
  Filled 2013-09-08: qty 1

## 2013-09-08 NOTE — Progress Notes (Signed)
Patient ID: Marcus Crawford, male   DOB: [birth year], 63 y.o.   MRN: [id number]  1 Day Post-Op Subjective: The patient is doing well.  No nausea or vomiting. Pain is adequately controlled.  Objective: Vital signs in last 24 hours: Temp:  [97.4 F (36.3 C)-98.4 F (36.9 C)] 97.4 F (36.3 C) (03/10 0559) Pulse Rate:  [47-83] 67 (03/10 0559) Resp:  [9-18] 18 (03/10 0559) BP: (94-144)/(54-83) 127/54 mmHg (03/10 0559) SpO2:  [94 %-100 %] 95 % (03/10 0559) Weight:  [101.606 kg (224 lb)] 101.606 kg (224 lb) (03/09 1155)  Intake/Output from previous day: 03/09 0701 - 03/10 0700 In: 3642.5 [P.O.:480; I.V.:3112.5; IV Piggyback:50] Out: 2728 [Urine:2575; Drains:103; Blood:50] Intake/Output this shift:    Physical Exam:  General: Alert and oriented. CV: RRR Lungs: Clear bilaterally. GI: Soft, Nondistended. Incisions: C/D/I Urine: Clear Extremities: Nontender, no erythema, no edema.  Lab Results:  Recent Labs  09/07/13 1441 09/08/13 0423  HGB 13.1 13.0  HCT 38.5* 38.7*      Assessment/Plan: POD# 1 s/p robotic prostatectomy.  1) SL IVF 2) Ambulate, Incentive spirometry 3) Transition to oral pain medication 4) Dulcolax suppository 5) D/C pelvic drain 6) Plan for likely discharge later today   Pryor Curia. MD   LOS: 1 day   Johnny Gorter,LES 09/08/2013, 7:34 AM

## 2013-09-08 NOTE — Discharge Summary (Signed)
  Date of admission: 09/07/2013  Date of discharge: 09/08/2013  Admission diagnosis: Prostate Cancer  Discharge diagnosis: Prostate Cancer  History and Physical: For full details, please see admission history and physical. Briefly, Marcus Crawford is a 63 y.o. gentleman with localized prostate cancer.  After discussing management/treatment options, he elected to proceed with surgical treatment.  Hospital Course: JERMEY CLOSS was taken to the operating room on 09/07/2013 and underwent a robotic assisted laparoscopic radical prostatectomy. He tolerated this procedure well and without complications. Postoperatively, he was able to be transferred to a regular hospital room following recovery from anesthesia.  He was able to begin ambulating the night of surgery. He remained hemodynamically stable overnight.  He had excellent urine output with appropriately minimal output from his pelvic drain and his pelvic drain was removed on POD #1.  He was transitioned to oral pain medication, tolerated a clear liquid diet, and had met all discharge criteria and was able to be discharged home later on POD#1.  Laboratory values:  Recent Labs  09/07/13 1441 09/08/13 0423  HGB 13.1 13.0  HCT 38.5* 38.7*    Disposition: Home  Discharge instruction: He was instructed to be ambulatory but to refrain from heavy lifting, strenuous activity, or driving. He was instructed on urethral catheter care.  Discharge medications:     Medication List         cholecalciferol 1000 UNITS tablet  Commonly known as:  VITAMIN D  Take 3,000 Units by mouth daily.     ciprofloxacin 500 MG tablet  Commonly known as:  CIPRO  Take 1 tablet (500 mg total) by mouth 2 (two) times daily. Start day prior to office visit for foley removal     HYDROcodone-acetaminophen 5-325 MG per tablet  Commonly known as:  NORCO  Take 1-2 tablets by mouth every 6 (six) hours as needed.     psyllium 58.6 % powder  Commonly known as:  METAMUCIL   Take 1 packet by mouth daily.     RABEprazole 20 MG tablet  Commonly known as:  ACIPHEX  Take 20 mg by mouth every other day.     ramipril 2.5 MG capsule  Commonly known as:  ALTACE  Take 2.5 mg by mouth every other day.        Followup: He will followup in 1 week for catheter removal and to discuss his surgical pathology results.

## 2013-10-05 DIAGNOSIS — R279 Unspecified lack of coordination: Secondary | ICD-10-CM | POA: Diagnosis not present

## 2013-10-05 DIAGNOSIS — N393 Stress incontinence (female) (male): Secondary | ICD-10-CM | POA: Diagnosis not present

## 2013-10-05 DIAGNOSIS — M6281 Muscle weakness (generalized): Secondary | ICD-10-CM | POA: Diagnosis not present

## 2013-10-14 DIAGNOSIS — N183 Chronic kidney disease, stage 3 unspecified: Secondary | ICD-10-CM | POA: Diagnosis not present

## 2013-10-14 DIAGNOSIS — I129 Hypertensive chronic kidney disease with stage 1 through stage 4 chronic kidney disease, or unspecified chronic kidney disease: Secondary | ICD-10-CM | POA: Diagnosis not present

## 2013-10-21 DIAGNOSIS — M6281 Muscle weakness (generalized): Secondary | ICD-10-CM | POA: Diagnosis not present

## 2013-10-21 DIAGNOSIS — N393 Stress incontinence (female) (male): Secondary | ICD-10-CM | POA: Diagnosis not present

## 2013-10-21 DIAGNOSIS — R279 Unspecified lack of coordination: Secondary | ICD-10-CM | POA: Diagnosis not present

## 2013-11-10 DIAGNOSIS — N393 Stress incontinence (female) (male): Secondary | ICD-10-CM | POA: Diagnosis not present

## 2013-11-10 DIAGNOSIS — M6281 Muscle weakness (generalized): Secondary | ICD-10-CM | POA: Diagnosis not present

## 2013-11-10 DIAGNOSIS — R279 Unspecified lack of coordination: Secondary | ICD-10-CM | POA: Diagnosis not present

## 2014-01-04 ENCOUNTER — Encounter: Payer: No Typology Code available for payment source | Admitting: Internal Medicine

## 2014-01-05 ENCOUNTER — Ambulatory Visit (INDEPENDENT_AMBULATORY_CARE_PROVIDER_SITE_OTHER): Payer: Medicare Other | Admitting: Internal Medicine

## 2014-01-05 ENCOUNTER — Encounter: Payer: Self-pay | Admitting: Internal Medicine

## 2014-01-05 ENCOUNTER — Telehealth: Payer: Self-pay | Admitting: Internal Medicine

## 2014-01-05 VITALS — BP 138/88 | HR 63 | Temp 98.0°F | Ht 73.0 in | Wt 219.0 lb

## 2014-01-05 DIAGNOSIS — K219 Gastro-esophageal reflux disease without esophagitis: Secondary | ICD-10-CM | POA: Diagnosis not present

## 2014-01-05 DIAGNOSIS — I10 Essential (primary) hypertension: Secondary | ICD-10-CM | POA: Diagnosis not present

## 2014-01-05 DIAGNOSIS — Z860100 Personal history of colon polyps, unspecified: Secondary | ICD-10-CM

## 2014-01-05 DIAGNOSIS — E559 Vitamin D deficiency, unspecified: Secondary | ICD-10-CM

## 2014-01-05 DIAGNOSIS — E785 Hyperlipidemia, unspecified: Secondary | ICD-10-CM

## 2014-01-05 DIAGNOSIS — R7309 Other abnormal glucose: Secondary | ICD-10-CM

## 2014-01-05 NOTE — Progress Notes (Signed)
   Subjective:    Patient ID: Marcus Crawford, male    DOB: 10-19-1950, 63 y.o.   MRN: [id number]  HPI He is here to assess active health issues & conditions. PMH, FH, & Social history verified & updated. GERD quiescent even on only Prevacid qod.  A heart healthy diet is followed; exercise encompasses 35 minutes 4-5  times per week as walking without symptoms.  Family history is neg for premature coronary disease. Advanced cholesterol testing reveals  LDL goal is less than 100 ; ideally < 70. To date no statin.  Low dose ASA not taken due to renal issue.   Review of Systems Unexplained weight loss, abdominal pain, significant dyspepsia, dysphagia, melena, rectal bleeding, or persistently small caliber stools are denied.  Specifically denied are  chest pain, palpitations, dyspnea, or claudication.       Objective:   Physical Exam Gen.: Healthy and well-nourished in appearance. Alert, appropriate and cooperative throughout exam. Appears younger than stated age  Head: Normocephalic without obvious abnormalities Eyes: No corneal or conjunctival inflammation noted. Pupils equal round reactive to light and accommodation. Extraocular motion intact.  Ears: External  ear exam reveals no significant lesions or deformities. Canals clear .TMs normal. Hearing is grossly normal bilaterally. Nose: External nasal exam reveals no deformity or inflammation. Nasal mucosa are pink and moist. No lesions or exudates noted.   Mouth: Oral mucosa and oropharynx reveal no lesions or exudates. Teeth in good repair. Neck: No deformities, masses, or tenderness noted. Range of motion & Thyroid normal Lungs: Normal respiratory effort; chest expands symmetrically. Lungs are clear to auscultation without rales, wheezes, or increased work of breathing. Heart: Normal rate and rhythm. Normal S1 and S2. No gallop, click, or rub. No murmur. Abdomen: Bowel sounds normal; abdomen soft and nontender. No masses, organomegaly or  hernias noted. Genitalia:  as per Dr Alinda Money                                  Musculoskeletal/extremities: No deformity or scoliosis noted of  the thoracic or lumbar spine.  No clubbing, cyanosis, edema, or significant extremity  deformity noted. Range of motion normal .Tone & strength normal. Hand joints normal Fingernail health good. Able to lie down & sit up w/o help. Negative SLR bilaterally Vascular: Carotid, radial artery, dorsalis pedis and  posterior tibial pulses are full and equal. No bruits present. Neurologic: Alert and oriented x3. Deep tendon reflexes symmetrical and normal.  Gait normal .    Skin: Intact without suspicious lesions or rashes. Lymph: No cervical, axillary lymphadenopathy present. Psych: Mood and affect are normal. Normally interactive                                                                                        Assessment & Plan:  See Current Assessment & Plan in Problem List under specific DiagnosisThe labs will be reviewed and risks and options assessed. Written recommendations will be provided by mail or directly through My Chart.Further evaluation or change in medical therapy will be directed by those results.

## 2014-01-05 NOTE — Assessment & Plan Note (Signed)
CBC

## 2014-01-05 NOTE — Patient Instructions (Signed)
Your next office appointment will be determined based upon review of your pending labs. Those instructions will be transmitted to you through My Chart . Share results with all non [facility] medical staff seen

## 2014-01-05 NOTE — Progress Notes (Signed)
Pre visit review using our clinic review tool, if applicable. No additional management support is needed unless otherwise documented below in the visit note. 

## 2014-01-05 NOTE — Assessment & Plan Note (Signed)
Nephrology monitor every 6 mos

## 2014-01-05 NOTE — Assessment & Plan Note (Signed)
Vit D level.

## 2014-01-05 NOTE — Telephone Encounter (Signed)
Relevant patient education assigned to patient using Emmi. ° °

## 2014-01-05 NOTE — Assessment & Plan Note (Signed)
A1c

## 2014-01-06 ENCOUNTER — Other Ambulatory Visit (INDEPENDENT_AMBULATORY_CARE_PROVIDER_SITE_OTHER): Payer: Medicare Other

## 2014-01-06 DIAGNOSIS — Z86010 Personal history of colonic polyps: Secondary | ICD-10-CM

## 2014-01-06 DIAGNOSIS — R7309 Other abnormal glucose: Secondary | ICD-10-CM | POA: Diagnosis not present

## 2014-01-06 DIAGNOSIS — E559 Vitamin D deficiency, unspecified: Secondary | ICD-10-CM

## 2014-01-06 DIAGNOSIS — E785 Hyperlipidemia, unspecified: Secondary | ICD-10-CM | POA: Diagnosis not present

## 2014-01-06 LAB — LIPID PANEL
CHOLESTEROL: 215 mg/dL — AB (ref 0–200)
HDL: 42.4 mg/dL (ref >39.00)
LDL Cholesterol: 138 mg/dL — ABNORMAL HIGH (ref 0–99)
NonHDL: 172.6
Total CHOL/HDL Ratio: 5
Triglycerides: 174 mg/dL — ABNORMAL HIGH (ref 0.0–149.0)
VLDL: 34.8 mg/dL (ref 0.0–40.0)

## 2014-01-06 LAB — CBC WITH DIFFERENTIAL/PLATELET
BASOS ABS: 0 10*3/uL (ref 0.0–0.1)
Basophils Relative: 0.7 % (ref 0.0–3.0)
EOS PCT: 3.4 % (ref 0.0–5.0)
Eosinophils Absolute: 0.2 10*3/uL (ref 0.0–0.7)
HEMATOCRIT: 42.2 % (ref 39.0–52.0)
Hemoglobin: 14.3 g/dL (ref 13.0–17.0)
LYMPHS ABS: 1.5 10*3/uL (ref 0.7–4.0)
Lymphocytes Relative: 31.4 % (ref 12.0–46.0)
MCHC: 33.8 g/dL (ref 30.0–36.0)
MCV: 88.1 fl (ref 78.0–100.0)
MONO ABS: 0.4 10*3/uL (ref 0.1–1.0)
Monocytes Relative: 8.6 % (ref 3.0–12.0)
Neutro Abs: 2.6 10*3/uL (ref 1.4–7.7)
Neutrophils Relative %: 55.9 % (ref 43.0–77.0)
Platelets: 178 10*3/uL (ref 150.0–400.0)
RBC: 4.78 Mil/uL (ref 4.22–5.81)
RDW: 13.5 % (ref 11.5–15.5)
WBC: 4.7 10*3/uL (ref 4.0–10.5)

## 2014-01-06 LAB — HEPATIC FUNCTION PANEL
ALBUMIN: 4 g/dL (ref 3.5–5.2)
ALT: 25 U/L (ref 0–53)
AST: 13 U/L (ref 0–37)
Alkaline Phosphatase: 69 U/L (ref 39–117)
Bilirubin, Direct: 0.1 mg/dL (ref 0.0–0.3)
Total Bilirubin: 0.6 mg/dL (ref 0.2–1.2)
Total Protein: 6.5 g/dL (ref 6.0–8.3)

## 2014-01-06 LAB — TSH: TSH: 1.62 u[IU]/mL (ref 0.35–4.50)

## 2014-01-06 LAB — HEMOGLOBIN A1C: HEMOGLOBIN A1C: 5.9 % (ref 4.6–6.5)

## 2014-01-10 ENCOUNTER — Other Ambulatory Visit: Payer: Self-pay | Admitting: Internal Medicine

## 2014-01-10 DIAGNOSIS — E559 Vitamin D deficiency, unspecified: Secondary | ICD-10-CM

## 2014-01-10 LAB — VITAMIN D 1,25 DIHYDROXY
VITAMIN D3 1, 25 (OH): 30 pg/mL
Vitamin D 1, 25 (OH)2 Total: 30 pg/mL (ref 18–72)

## 2014-01-11 ENCOUNTER — Telehealth: Payer: Self-pay

## 2014-01-11 NOTE — Telephone Encounter (Signed)
Mailed along with Vitamin D results

## 2014-01-11 NOTE — Telephone Encounter (Signed)
Message copied by Shelly Coss on Mon Jan 11, 2014  9:45 AM ------      Message from: Hendricks Limes      Created: Sun Jan 10, 2014  5:39 PM        Please mail copy of vitamin D  resultswith these comments. Thanks.                        The normal goal for  Vitamin D is 40-60. Vitamin D, along with calcium 600 mg daily & weight bearing exercises ( walking 30-45 minutes 3-4 times per  week) are essential for bone health.      Add 1000 IU vitamin D3 one pill Mon, Weds , & Fri to present daily dose. Recheck vit D level in 6 months (268.9) [place]opp  ------

## 2014-02-26 DIAGNOSIS — C61 Malignant neoplasm of prostate: Secondary | ICD-10-CM | POA: Diagnosis not present

## 2014-03-05 DIAGNOSIS — N393 Stress incontinence (female) (male): Secondary | ICD-10-CM | POA: Diagnosis not present

## 2014-03-05 DIAGNOSIS — N529 Male erectile dysfunction, unspecified: Secondary | ICD-10-CM | POA: Diagnosis not present

## 2014-03-05 DIAGNOSIS — C61 Malignant neoplasm of prostate: Secondary | ICD-10-CM | POA: Diagnosis not present

## 2014-04-13 ENCOUNTER — Ambulatory Visit (INDEPENDENT_AMBULATORY_CARE_PROVIDER_SITE_OTHER): Payer: Medicare Other | Admitting: *Deleted

## 2014-04-13 DIAGNOSIS — Z23 Encounter for immunization: Secondary | ICD-10-CM | POA: Diagnosis not present

## 2014-04-26 ENCOUNTER — Other Ambulatory Visit: Payer: Self-pay | Admitting: Internal Medicine

## 2014-06-03 DIAGNOSIS — N183 Chronic kidney disease, stage 3 (moderate): Secondary | ICD-10-CM | POA: Diagnosis not present

## 2014-06-03 DIAGNOSIS — N189 Chronic kidney disease, unspecified: Secondary | ICD-10-CM | POA: Diagnosis not present

## 2014-06-03 DIAGNOSIS — D631 Anemia in chronic kidney disease: Secondary | ICD-10-CM | POA: Diagnosis not present

## 2014-06-03 DIAGNOSIS — N2581 Secondary hyperparathyroidism of renal origin: Secondary | ICD-10-CM | POA: Diagnosis not present

## 2014-06-03 DIAGNOSIS — I129 Hypertensive chronic kidney disease with stage 1 through stage 4 chronic kidney disease, or unspecified chronic kidney disease: Secondary | ICD-10-CM | POA: Diagnosis not present

## 2014-07-05 ENCOUNTER — Encounter: Payer: Self-pay | Admitting: Internal Medicine

## 2014-07-21 ENCOUNTER — Telehealth: Payer: Self-pay | Admitting: Internal Medicine

## 2014-07-21 NOTE — Telephone Encounter (Signed)
Hopp, there are results scanned into EPIC under media tab from [facility] dated 12/3 lab results- pt would like you to view, thanks.

## 2014-07-22 NOTE — Telephone Encounter (Signed)
I reviewed data; vit D level was normal. I defer to your Nephrologist as to vit D supplementation based on his medical literature review.

## 2014-07-22 NOTE — Telephone Encounter (Signed)
Notified pt with md response.../lmb 

## 2014-08-30 DIAGNOSIS — C61 Malignant neoplasm of prostate: Secondary | ICD-10-CM | POA: Diagnosis not present

## 2014-09-08 DIAGNOSIS — C61 Malignant neoplasm of prostate: Secondary | ICD-10-CM | POA: Diagnosis not present

## 2014-09-08 DIAGNOSIS — N529 Male erectile dysfunction, unspecified: Secondary | ICD-10-CM | POA: Diagnosis not present

## 2014-11-23 ENCOUNTER — Other Ambulatory Visit: Payer: Self-pay | Admitting: Dermatology

## 2014-11-23 DIAGNOSIS — D239 Other benign neoplasm of skin, unspecified: Secondary | ICD-10-CM | POA: Diagnosis not present

## 2014-11-23 DIAGNOSIS — L821 Other seborrheic keratosis: Secondary | ICD-10-CM | POA: Diagnosis not present

## 2014-11-23 DIAGNOSIS — D485 Neoplasm of uncertain behavior of skin: Secondary | ICD-10-CM | POA: Diagnosis not present

## 2014-12-27 ENCOUNTER — Other Ambulatory Visit: Payer: Self-pay

## 2015-01-11 ENCOUNTER — Other Ambulatory Visit: Payer: Self-pay | Admitting: Emergency Medicine

## 2015-01-11 ENCOUNTER — Encounter: Payer: Self-pay | Admitting: Internal Medicine

## 2015-01-11 ENCOUNTER — Ambulatory Visit (INDEPENDENT_AMBULATORY_CARE_PROVIDER_SITE_OTHER): Payer: Medicare Other | Admitting: Internal Medicine

## 2015-01-11 ENCOUNTER — Other Ambulatory Visit (INDEPENDENT_AMBULATORY_CARE_PROVIDER_SITE_OTHER): Payer: Medicare Other

## 2015-01-11 VITALS — BP 130/88 | HR 61 | Temp 98.2°F | Resp 16 | Ht 73.0 in | Wt 226.0 lb

## 2015-01-11 DIAGNOSIS — R739 Hyperglycemia, unspecified: Secondary | ICD-10-CM

## 2015-01-11 DIAGNOSIS — E785 Hyperlipidemia, unspecified: Secondary | ICD-10-CM

## 2015-01-11 DIAGNOSIS — K219 Gastro-esophageal reflux disease without esophagitis: Secondary | ICD-10-CM | POA: Diagnosis not present

## 2015-01-11 DIAGNOSIS — N183 Chronic kidney disease, stage 3 unspecified: Secondary | ICD-10-CM

## 2015-01-11 DIAGNOSIS — Z86010 Personal history of colonic polyps: Secondary | ICD-10-CM

## 2015-01-11 DIAGNOSIS — N189 Chronic kidney disease, unspecified: Secondary | ICD-10-CM

## 2015-01-11 LAB — HEPATIC FUNCTION PANEL
ALT: 21 U/L (ref 0–53)
AST: 9 U/L (ref 0–37)
Albumin: 4.2 g/dL (ref 3.5–5.2)
Alkaline Phosphatase: 68 U/L (ref 39–117)
BILIRUBIN DIRECT: 0.1 mg/dL (ref 0.0–0.3)
BILIRUBIN TOTAL: 0.6 mg/dL (ref 0.2–1.2)
TOTAL PROTEIN: 6.7 g/dL (ref 6.0–8.3)

## 2015-01-11 LAB — LIPID PANEL
Cholesterol: 213 mg/dL — ABNORMAL HIGH (ref 0–200)
HDL: 38.8 mg/dL — ABNORMAL LOW (ref >39.00)
NonHDL: 174.2
Total CHOL/HDL Ratio: 5
Triglycerides: 219 mg/dL — ABNORMAL HIGH (ref 0.0–149.0)
VLDL: 43.8 mg/dL — AB (ref 0.0–40.0)

## 2015-01-11 LAB — TSH: TSH: 2.46 u[IU]/mL (ref 0.35–4.50)

## 2015-01-11 LAB — HEMOGLOBIN A1C: HEMOGLOBIN A1C: 5.9 % (ref 4.6–6.5)

## 2015-01-11 LAB — LDL CHOLESTEROL, DIRECT: LDL DIRECT: 118 mg/dL

## 2015-01-11 MED ORDER — RABEPRAZOLE SODIUM 20 MG PO TBEC: 20.0000 mg | DELAYED_RELEASE_TABLET | ORAL | Status: DC

## 2015-01-11 NOTE — Progress Notes (Signed)
   Subjective:    Patient ID: Marcus Crawford, male    DOB: February 01, 1951, 64 y.o.   MRN: [id number]  HPI The patient is here to assess status of active health conditions.  PMH, FH, & Social History reviewed & updated.  He has been compliant with his AcipHex. He has no adverse effects with medications. He does ingest some fried foods and red meats but does restrict salt. He walks 5 days a week 2 miles each day without cardiopulmonary symptoms. His colonoscopy is due next year. 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 Alinda Money is his urologist; and Dr. Justin Mend monitors his chronic renal insufficiency.  He has a diagnosis of vitamin D deficiency and has 700 international units in his multivitamin.  He does not smoke or drink.  There are no new family history issues.    Review of Systems  Chest pain, palpitations, tachycardia, exertional dyspnea, paroxysmal nocturnal dyspnea, claudication or edema are absent. No unexplained weight loss, abdominal pain, significant dyspepsia, dysphagia, melena, rectal bleeding, or persistently small caliber stools. Dysuria, pyuria, hematuria, frequency, nocturia or polyuria are denied. Change in hair, skin, nails denied. No bowel changes of constipation or diarrhea. No intolerance to heat or cold.      Objective:   Physical Exam General appearance :adequately nourished; in no distress.  Eyes: No conjunctival inflammation or scleral icterus is present.  Eyes: Extraocular motion intact. He has bilateral ptosis, this is slightly more prominent on the left.  Ears: Canals patent. Tympanic membranes normal. Hearing is normal bilaterally.   Oral exam:  Lips and gums are healthy appearing.There is no oropharyngeal erythema or exudate noted. Dental hygiene is good.  Heart:  Normal rate and regular rhythm. S1 and S2 normal without gallop, murmur, click, or rub . S4 present    Lungs:Chest clear to auscultation; no  wheezes, rhonchi,rales ,or rubs present.No increased work of breathing.   Abdomen: bowel sounds normal, soft and non-tender without masses, organomegaly or hernias noted.  No guarding or rebound. No flank tenderness to percussion.  Vascular : all pulses equal ; no bruits present.  Skin:Warm & dry.  Intact without suspicious lesions or rashes ; no tenting or jaundice   Lymphatic: No lymphadenopathy is noted about the head, neck, axilla  Genitourinary exam deferred to Dr. Alinda Money.   Neuro: Strength, tone & DTRs normal.         Assessment & Plan:  See Current Assessment & Plan in Problem List under specific Diagnosis

## 2015-01-11 NOTE — Assessment & Plan Note (Signed)
A1c

## 2015-01-11 NOTE — Patient Instructions (Signed)
Reflux of gastric acid may be asymptomatic as this may occur mainly during sleep.The triggers for reflux  include stress; the "aspirin family" ; alcohol; peppermint; and caffeine (coffee, tea, cola, and chocolate). The aspirin family would include aspirin and the nonsteroidal agents such as ibuprofen &  Naproxen. Tylenol would not cause reflux. If having symptoms ; food & drink should be avoided for @ least 2 hours before going to bed.   Your next office appointment will be determined based upon review of your pending labs  and  xrays  Those written interpretation of the lab results and instructions will be transmitted to you by My Chart   Critical results will be called.   Followup as needed for any active or acute issue. Please report any significant change in your symptoms.

## 2015-01-11 NOTE — Assessment & Plan Note (Signed)
Lipids, LFTs, TSH  

## 2015-01-11 NOTE — Assessment & Plan Note (Signed)
Renew Aciphex

## 2015-01-11 NOTE — Progress Notes (Signed)
Pre visit review using our clinic review tool, if applicable. No additional management support is needed unless otherwise documented below in the visit note. 

## 2015-01-11 NOTE — Assessment & Plan Note (Signed)
BMET done 2/16

## 2015-01-11 NOTE — Assessment & Plan Note (Signed)
Asymptomatic;Colonoscopy due 2017

## 2015-03-09 DIAGNOSIS — C61 Malignant neoplasm of prostate: Secondary | ICD-10-CM | POA: Diagnosis not present

## 2015-03-23 DIAGNOSIS — N529 Male erectile dysfunction, unspecified: Secondary | ICD-10-CM | POA: Diagnosis not present

## 2015-03-23 DIAGNOSIS — C61 Malignant neoplasm of prostate: Secondary | ICD-10-CM | POA: Diagnosis not present

## 2015-04-21 ENCOUNTER — Ambulatory Visit (INDEPENDENT_AMBULATORY_CARE_PROVIDER_SITE_OTHER): Payer: Medicare Other

## 2015-04-21 DIAGNOSIS — Z23 Encounter for immunization: Secondary | ICD-10-CM

## 2015-06-02 DIAGNOSIS — H524 Presbyopia: Secondary | ICD-10-CM | POA: Diagnosis not present

## 2015-06-30 ENCOUNTER — Encounter: Payer: Self-pay | Admitting: *Deleted

## 2015-07-19 ENCOUNTER — Telehealth: Payer: Self-pay | Admitting: Internal Medicine

## 2015-07-19 NOTE — Telephone Encounter (Signed)
Patient is requesting to transfer from [place] to [place].  Please advise.

## 2015-07-22 NOTE — Telephone Encounter (Signed)
Yes

## 2015-07-25 NOTE — Telephone Encounter (Signed)
Got scheduled  °

## 2015-08-10 DIAGNOSIS — I129 Hypertensive chronic kidney disease with stage 1 through stage 4 chronic kidney disease, or unspecified chronic kidney disease: Secondary | ICD-10-CM | POA: Diagnosis not present

## 2015-08-10 DIAGNOSIS — D631 Anemia in chronic kidney disease: Secondary | ICD-10-CM | POA: Diagnosis not present

## 2015-08-10 DIAGNOSIS — Z8679 Personal history of other diseases of the circulatory system: Secondary | ICD-10-CM | POA: Diagnosis not present

## 2015-08-10 DIAGNOSIS — N183 Chronic kidney disease, stage 3 (moderate): Secondary | ICD-10-CM | POA: Diagnosis not present

## 2015-08-10 DIAGNOSIS — N189 Chronic kidney disease, unspecified: Secondary | ICD-10-CM | POA: Diagnosis not present

## 2015-08-10 DIAGNOSIS — N2581 Secondary hyperparathyroidism of renal origin: Secondary | ICD-10-CM | POA: Diagnosis not present

## 2015-08-10 LAB — CBC AND DIFFERENTIAL
HCT: 41 % (ref 41–53)
HEMOGLOBIN: 14.2 g/dL (ref 13.5–17.5)
Platelets: 175 10*3/uL (ref 150–399)
WBC: 6.3 10*3/mL

## 2015-08-10 LAB — BASIC METABOLIC PANEL
BUN: 32 mg/dL — AB (ref 4–21)
Creatinine: 1.6 mg/dL — AB (ref 0.6–1.3)
Glucose: 90 mg/dL
Potassium: 4.5 mmol/L (ref 3.4–5.3)
SODIUM: 137 mmol/L (ref 137–147)

## 2015-08-10 LAB — HEPATIC FUNCTION PANEL
ALT: 20 U/L (ref 10–40)
AST: 10 U/L — AB (ref 14–40)
Alkaline Phosphatase: 83 U/L (ref 25–125)
BILIRUBIN, TOTAL: 0.3 mg/dL

## 2015-09-02 ENCOUNTER — Other Ambulatory Visit: Payer: Self-pay | Admitting: Internal Medicine

## 2015-09-02 LAB — VITAMIN D 25 HYDROXY (VIT D DEFICIENCY, FRACTURES): Vitamin D, 25 Hydroxy RIA: 47.3

## 2015-09-02 LAB — GLOMERULAR BASEMENT MEMBRANE ANTIBODIES: EGFR (Non-African Amer.): 45

## 2015-09-16 DIAGNOSIS — C61 Malignant neoplasm of prostate: Secondary | ICD-10-CM | POA: Diagnosis not present

## 2015-09-23 DIAGNOSIS — N5201 Erectile dysfunction due to arterial insufficiency: Secondary | ICD-10-CM | POA: Diagnosis not present

## 2015-09-23 DIAGNOSIS — C61 Malignant neoplasm of prostate: Secondary | ICD-10-CM | POA: Diagnosis not present

## 2015-09-23 DIAGNOSIS — Z0000 Encounter for general adult medical examination without abnormal findings: Secondary | ICD-10-CM | POA: Diagnosis not present

## 2015-09-23 LAB — PSA

## 2015-11-03 ENCOUNTER — Encounter: Payer: Self-pay | Admitting: Internal Medicine

## 2016-01-04 ENCOUNTER — Encounter: Payer: Self-pay | Admitting: Gastroenterology

## 2016-01-12 ENCOUNTER — Encounter: Payer: Self-pay | Admitting: Internal Medicine

## 2016-01-12 ENCOUNTER — Ambulatory Visit (INDEPENDENT_AMBULATORY_CARE_PROVIDER_SITE_OTHER): Payer: Medicare Other | Admitting: Internal Medicine

## 2016-01-12 ENCOUNTER — Other Ambulatory Visit (INDEPENDENT_AMBULATORY_CARE_PROVIDER_SITE_OTHER): Payer: Medicare Other

## 2016-01-12 VITALS — BP 140/82 | HR 62 | Temp 98.0°F | Resp 16 | Ht 73.0 in | Wt 226.0 lb

## 2016-01-12 DIAGNOSIS — E785 Hyperlipidemia, unspecified: Secondary | ICD-10-CM

## 2016-01-12 DIAGNOSIS — Z0000 Encounter for general adult medical examination without abnormal findings: Secondary | ICD-10-CM

## 2016-01-12 DIAGNOSIS — R202 Paresthesia of skin: Secondary | ICD-10-CM

## 2016-01-12 DIAGNOSIS — E2609 Other primary hyperaldosteronism: Secondary | ICD-10-CM

## 2016-01-12 DIAGNOSIS — Z23 Encounter for immunization: Secondary | ICD-10-CM | POA: Diagnosis not present

## 2016-01-12 DIAGNOSIS — I10 Essential (primary) hypertension: Secondary | ICD-10-CM

## 2016-01-12 DIAGNOSIS — N183 Chronic kidney disease, stage 3 unspecified: Secondary | ICD-10-CM | POA: Insufficient documentation

## 2016-01-12 DIAGNOSIS — R739 Hyperglycemia, unspecified: Secondary | ICD-10-CM

## 2016-01-12 DIAGNOSIS — R51 Headache: Secondary | ICD-10-CM

## 2016-01-12 DIAGNOSIS — E669 Obesity, unspecified: Secondary | ICD-10-CM

## 2016-01-12 DIAGNOSIS — E66811 Obesity, class 1: Secondary | ICD-10-CM

## 2016-01-12 DIAGNOSIS — R519 Headache, unspecified: Secondary | ICD-10-CM

## 2016-01-12 LAB — BASIC METABOLIC PANEL
BUN: 28 mg/dL — AB (ref 6–23)
CALCIUM: 9.9 mg/dL (ref 8.4–10.5)
CHLORIDE: 96 meq/L (ref 96–112)
CO2: 28 meq/L (ref 19–32)
CREATININE: 1.55 mg/dL — AB (ref 0.40–1.50)
GFR: 47.71 mL/min — ABNORMAL LOW (ref >60.00)
Glucose, Bld: 114 mg/dL — ABNORMAL HIGH (ref 70–99)
Potassium: 5.3 mEq/L — ABNORMAL HIGH (ref 3.5–5.1)
Sodium: 147 mEq/L — ABNORMAL HIGH (ref 135–145)

## 2016-01-12 LAB — LIPID PANEL
CHOL/HDL RATIO: 5
Cholesterol: 200 mg/dL (ref 0–200)
HDL: 38.9 mg/dL — ABNORMAL LOW (ref >39.00)
NONHDL: 160.91
Triglycerides: 235 mg/dL — ABNORMAL HIGH (ref 0.0–149.0)
VLDL: 47 mg/dL — AB (ref 0.0–40.0)

## 2016-01-12 LAB — TSH: TSH: 1.97 u[IU]/mL (ref 0.35–4.50)

## 2016-01-12 LAB — LDL CHOLESTEROL, DIRECT: LDL DIRECT: 110 mg/dL

## 2016-01-12 LAB — HEMOGLOBIN A1C: Hgb A1c MFr Bld: 6.1 % (ref 4.6–6.5)

## 2016-01-12 NOTE — Progress Notes (Addendum)
Subjective:  Patient ID: Marcus Crawford, male    DOB: 04-05-1951  Age: 65 y.o. MRN: [id number]  CC: Hypertension; Hyperlipidemia; Headache; and Annual Exam   HPI JAIMES DELOSANGELES presents for a CPX.  He complains of a 3 month history of headache around the left parietal scalp with tingling in the back of his scalp. He describes the pain as an intermittent sharp sensation with no changes in his vision or hearing. He has a long-standing history of right-sided Bell's palsy for several decades but offers no new neurological complaints such as paresthesias in his face arms or legs. The headache does not interfere with his vision or hearing. He has no trouble walking or talking. He has not taken anything for the headache.  He underwent a prostatectomy a little over 2 years ago for prostate cancer and tells me he sees his urologist about every 6 months for follow-up. He also has a history of adrenal adenoma and renal insufficiency which is followed by the nephrologist Dr. Justin Mend. He tells me his recent blood pressure at home is been about 120-130/60-70.     Past Medical History  Diagnosis Date  . Colonic polyp     X3 ,hyperplastic  . Diverticulosis of colon   . Hyperlipidemia   . Bell's palsy   . Hypertension   . Blood clot in vein 2005    left leg "blood clot"-treated as OP; no residual  . GERD (gastroesophageal reflux disease)   . H/O hiatal hernia   . Nephrolithiasis 2013    kidney stone  as incidental finding on imaging  . CKD (chronic kidney disease) stage 3, GFR 30-59 ml/min saw dr webb 6 months ago    on rampiril for kidney function also  . Migraine last 6-7 yrs ago    PMH of ; resolved post adrenal adenoma resection  . PONV (postoperative nausea and vomiting)   . Hemorrhoid   . Cancer [facility])     prostate  . Kidney stone on left side     asymptomatic , incidental finding   Past Surgical History  Procedure Laterality Date  . Esophageal dilation  2001    Dr Velora Heckler  .  Colonoscopy      last 9/12, Dr Fuller Plan; due 2017  . Polypectomy       X 3  . Adrenalectomy  04/14/2012    Procedure: ADRENALECTOMY;  Surgeon: Ralene Ok, MD;  Location: [facility]S;  Service: General;  Laterality: Left;  Left Adrenal Excision  . Appendectomy  1985  . Adrenal venous sampling  03-2012  . Robot assisted laparoscopic radical prostatectomy N/A 09/07/2013    Procedure: ROBOTIC ASSISTED LAPAROSCOPIC RADICAL PROSTATECTOMY LEVEL 1;  Surgeon: Dutch Gray, MD;  Location: [facility];  Service: Urology;  Laterality: N/A;    reports that he has never smoked. He has never used smokeless tobacco. He reports that he drinks alcohol. He reports that he does not use illicit drugs. family history includes Breast cancer in his sister; Colon cancer (age of onset: 3) in his mother; Diabetes in his brother and sister; Heart attack (age of onset: 47) in his brother; Heart attack (age of onset: 41) in his mother; Heart failure in his brother; Hypertension in his brother; Kidney cancer in his brother; Lung cancer in his sister; Prostate cancer (age of onset: 53) in his brother. Allergies  Allergen Reactions  . Penicillins     Rash (RN clarified with pt) No SOB/swelling  . Sulfa Antibiotics Rash  Outpatient Prescriptions Prior to Visit  Medication Sig Dispense Refill  . cholecalciferol (VITAMIN D) 1000 UNITS tablet Take 3,000 Units by mouth daily.     . RABEprazole (ACIPHEX) 20 MG tablet Take 1 tablet (20 mg total) by mouth every other day. 90 tablet 3  . sildenafil (REVATIO) 20 MG tablet Take 20 mg by mouth as needed.    . psyllium (METAMUCIL) 58.6 % powder Take 1 packet by mouth daily.     No facility-administered medications prior to visit.    ROS Review of Systems  Constitutional: Negative.  Negative for fever, chills, diaphoresis, activity change, appetite change, fatigue and unexpected weight change.  HENT: Negative for facial swelling, sinus pressure, sore throat and trouble swallowing.     Eyes: Negative.  Negative for photophobia and visual disturbance.  Respiratory: Negative.  Negative for cough, choking, chest tightness, shortness of breath and stridor.   Cardiovascular: Negative.  Negative for chest pain, palpitations and leg swelling.  Gastrointestinal: Negative.  Negative for nausea, vomiting, abdominal pain, diarrhea and constipation.  Endocrine: Negative.   Genitourinary: Negative.   Musculoskeletal: Negative.   Skin: Negative.   Allergic/Immunologic: Negative.   Neurological: Positive for headaches. Negative for dizziness, tremors, syncope, facial asymmetry, weakness, light-headedness and numbness.  Hematological: Negative.  Negative for adenopathy. Does not bruise/bleed easily.  Psychiatric/Behavioral: Negative.     Objective:  BP 140/82 mmHg  Pulse 62  Temp(Src) 98 F (36.7 C) (Oral)  Resp 16  Ht 6\' 1"  (1.854 m)  Wt 226 lb (102.513 kg)  BMI 29.82 kg/m2  SpO2 97%  BP Readings from Last 3 Encounters:  01/12/16 140/82  01/11/15 130/88  01/05/14 138/88    Wt Readings from Last 3 Encounters:  01/12/16 226 lb (102.513 kg)  01/11/15 226 lb (102.513 kg)  01/05/14 219 lb (99.338 kg)    Physical Exam  Constitutional: No distress.  HENT:  Mouth/Throat: Oropharynx is clear and moist. No oropharyngeal exudate.  Eyes: Conjunctivae are normal. Right eye exhibits no discharge. Left eye exhibits no discharge. No scleral icterus.  Neck: Normal range of motion. Neck supple. No JVD present. No tracheal deviation present. No thyromegaly present.  Cardiovascular: Normal rate, regular rhythm, normal heart sounds and intact distal pulses.  Exam reveals no gallop and no friction rub.   No murmur heard. Pulmonary/Chest: Effort normal and breath sounds normal. No stridor. No respiratory distress. He has no wheezes. He has no rales. He exhibits no tenderness.  Abdominal: Soft. Bowel sounds are normal. He exhibits no distension and no mass. There is no tenderness. There  is no rebound and no guarding.  Musculoskeletal: Normal range of motion. He exhibits no edema or tenderness.  Lymphadenopathy:    He has no cervical adenopathy.  Neurological: He is alert. He has normal strength. He displays no atrophy, no tremor and normal reflexes. A cranial nerve deficit is present. No sensory deficit. He exhibits normal muscle tone. He displays a negative Romberg sign. He displays no seizure activity. Coordination and gait normal.  Reflex Scores:      Tricep reflexes are 1+ on the right side and 1+ on the left side.      Bicep reflexes are 1+ on the right side and 1+ on the left side.      Brachioradialis reflexes are 1+ on the right side and 1+ on the left side.      Patellar reflexes are 1+ on the right side and 1+ on the left side.  Achilles reflexes are 1+ on the right side and 1+ on the left side. Mild, right peripheral, seventh facial nerve palsy noted  Skin: Skin is warm and dry. No rash noted. He is not diaphoretic. No erythema. No pallor.  Psychiatric: He has a normal mood and affect. His behavior is normal. Judgment and thought content normal.  Vitals reviewed.   Lab Results  Component Value Date   WBC 6.3 08/10/2015   HGB 14.2 08/10/2015   HCT 41 08/10/2015   PLT 175 08/10/2015   GLUCOSE 114* 01/12/2016   CHOL 200 01/12/2016   TRIG 235.0* 01/12/2016   HDL 38.90* 01/12/2016   LDLDIRECT 110.0 01/12/2016   LDLCALC 138* 01/06/2014   ALT 20 08/10/2015   AST 10* 08/10/2015   NA 147* 01/12/2016   K 5.3* 01/12/2016   CL 96 01/12/2016   CREATININE 1.55* 01/12/2016   BUN 28* 01/12/2016   CO2 28 01/12/2016   TSH 1.97 01/12/2016   PSA <0.01 09/23/2015   INR 1.12 02/28/2012   HGBA1C 6.1 01/12/2016   MICROALBUR 0.4 10/14/2008    Dg Chest 2 View  09/01/2013  CLINICAL DATA:  Preop for prostatectomy. EXAM: CHEST  2 VIEW COMPARISON:  04/14/2012 FINDINGS: The heart size and mediastinal contours are within normal limits. Both lungs are clear. The bony  thorax is intact. No osteoblastic or osteolytic lesions. IMPRESSION: No active cardiopulmonary disease. Electronically Signed   By: Lajean Manes M.D.   On: 09/01/2013 10:24    Assessment & Plan:   Marisol was seen today for hypertension, hyperlipidemia, headache and annual exam.  Diagnoses and all orders for this visit:  Essential hypertension- his blood pressure is adequately well controlled. -     Basic metabolic panel; Future  Primary hyperaldosteronism ([place])- his sodium and potassium are both slightly elevated today, this does not fit with a recurrence of primary hyperaldosteronism but is consistent with multiple lab errors that I've seen her lab this week with elevated sodiums and potassiums, will continue to monitor his electrolytes during further follow-up visits but at this time I do not see any recurrence of primary hyperaldosteronism. -     Basic metabolic panel; Future  Hyperglycemia- His A1c is 6.1%, he is prediabetic and agrees to work on his lifestyle modifications. -     Basic metabolic panel; Future -     Hemoglobin A1c; Future  Hyperlipidemia with target LDL less than 130- his Framingham risk score is 20% so I have asked him to start taking a statin for risk reduction. -     Lipid panel; Future -     TSH; Future  Obesity, Class I, BMI 30-34.9- he agrees to work on his lifestyle modifications to lose weight.  Need for prophylactic vaccination against Streptococcus pneumoniae (pneumococcus) -     Pneumococcal conjugate vaccine 13-valent  Kidney disease, chronic, stage III (GFR 30-59 ml/min)- his renal function is stable, he will avoid nephrotoxic agents.  Intractable episodic headache, unspecified headache type- I've ordered an MRI of his brain to see if there has been a CVA and to screen for mass and demyelination. -     MR Brain Wo Contrast; Future  Paresthesia- as above -     MR Brain Wo Contrast; Future  Routine general medical examination at a health care  facility   I have discontinued Mr. Abend psyllium. I am also having him maintain his cholecalciferol, sildenafil, and RABEprazole.  No orders of the defined types were placed in this encounter.  See AVS for instructions about healthy living and anticipatory guidance.  Follow-up: Return in about 3 months (around 04/13/2016).  Scarlette Calico, MD

## 2016-01-12 NOTE — Assessment & Plan Note (Signed)

## 2016-01-12 NOTE — Progress Notes (Signed)
Pre visit review using our clinic review tool, if applicable. No additional management support is needed unless otherwise documented below in the visit note. 

## 2016-01-12 NOTE — Patient Instructions (Signed)

## 2016-01-15 ENCOUNTER — Encounter: Payer: Self-pay | Admitting: Internal Medicine

## 2016-01-15 MED ORDER — ATORVASTATIN CALCIUM 40 MG PO TABS
40.0000 mg | ORAL_TABLET | Freq: Every day | ORAL | Status: DC
Start: 2016-01-15 — End: 2017-01-15

## 2016-01-19 ENCOUNTER — Encounter: Payer: Self-pay | Admitting: Internal Medicine

## 2016-01-19 ENCOUNTER — Ambulatory Visit
Admission: RE | Admit: 2016-01-19 | Discharge: 2016-01-19 | Disposition: A | Discharge status: Home / Self Care | Payer: Medicare Other | Source: Ambulatory Visit | Attending: Internal Medicine | Admitting: Internal Medicine

## 2016-01-19 DIAGNOSIS — R202 Paresthesia of skin: Secondary | ICD-10-CM

## 2016-01-19 DIAGNOSIS — R51 Headache: Secondary | ICD-10-CM | POA: Diagnosis not present

## 2016-01-19 DIAGNOSIS — R519 Headache, unspecified: Secondary | ICD-10-CM

## 2016-01-24 ENCOUNTER — Encounter: Payer: Self-pay | Admitting: Gastroenterology

## 2016-02-07 ENCOUNTER — Encounter: Payer: Self-pay | Admitting: Internal Medicine

## 2016-02-09 MED ORDER — RABEPRAZOLE SODIUM 20 MG PO TBEC: 20.0000 mg | DELAYED_RELEASE_TABLET | ORAL | 3 refills | Status: DC

## 2016-02-10 DIAGNOSIS — D239 Other benign neoplasm of skin, unspecified: Secondary | ICD-10-CM | POA: Diagnosis not present

## 2016-02-10 DIAGNOSIS — L259 Unspecified contact dermatitis, unspecified cause: Secondary | ICD-10-CM | POA: Diagnosis not present

## 2016-03-20 ENCOUNTER — Ambulatory Visit (AMBULATORY_SURGERY_CENTER): Payer: Self-pay

## 2016-03-20 VITALS — Ht 73.0 in | Wt 214.8 lb

## 2016-03-20 DIAGNOSIS — Z800 Family history of malignant neoplasm of digestive organs: Secondary | ICD-10-CM

## 2016-03-20 DIAGNOSIS — Z86010 Personal history of colonic polyps: Secondary | ICD-10-CM

## 2016-03-20 MED ORDER — NA SULFATE-K SULFATE-MG SULF 17.5-3.13-1.6 GM/177ML PO SOLN: ORAL | 0 refills | Status: DC

## 2016-03-20 NOTE — Progress Notes (Signed)
Per pt, no allergies to soy or egg products.Pt not taking any weight loss meds or using  O2 at home. 

## 2016-04-03 ENCOUNTER — Ambulatory Visit (AMBULATORY_SURGERY_CENTER): Payer: Medicare Other | Admitting: Gastroenterology

## 2016-04-03 ENCOUNTER — Encounter: Payer: Self-pay | Admitting: Gastroenterology

## 2016-04-03 VITALS — BP 122/83 | HR 53 | Temp 98.6°F | Resp 17 | Ht 73.0 in | Wt 214.0 lb

## 2016-04-03 DIAGNOSIS — D123 Benign neoplasm of transverse colon: Secondary | ICD-10-CM

## 2016-04-03 DIAGNOSIS — Z800 Family history of malignant neoplasm of digestive organs: Secondary | ICD-10-CM

## 2016-04-03 DIAGNOSIS — Z1212 Encounter for screening for malignant neoplasm of rectum: Secondary | ICD-10-CM | POA: Diagnosis not present

## 2016-04-03 DIAGNOSIS — Z1211 Encounter for screening for malignant neoplasm of colon: Secondary | ICD-10-CM

## 2016-04-03 DIAGNOSIS — K635 Polyp of colon: Secondary | ICD-10-CM | POA: Diagnosis not present

## 2016-04-03 LAB — HM COLONOSCOPY

## 2016-04-03 MED ORDER — SODIUM CHLORIDE 0.9 % IV SOLN: 500.0000 mL | INTRAVENOUS | Status: DC

## 2016-04-03 NOTE — Progress Notes (Signed)
A and O x3. Report to RN. Tolerated MAC anesthesia well. 

## 2016-04-03 NOTE — Op Note (Signed)
[facility] Patient Name: Marcus Crawford Procedure Date: 04/03/2016 10:35 AM MRN: [id number] Endoscopist: Ladene Artist , MD Age: 65 Referring MD:  Date of Birth: 03-25-1951 Gender: Male Account #: [id number] Procedure:                Colonoscopy Indications:              Screening in patient at increased risk: Family                            history of 1st-degree relative with colorectal                            cancer Medicines:                Monitored Anesthesia Care Procedure:                Pre-Anesthesia Assessment:                           - Prior to the procedure, a History and Physical                            was performed, and patient medications and                            allergies were reviewed. The patient's tolerance of                            previous anesthesia was also reviewed. The risks                            and benefits of the procedure and the sedation                            options and risks were discussed with the patient.                            All questions were answered, and informed consent                            was obtained. Prior Anticoagulants: The patient has                            taken no previous anticoagulant or antiplatelet                            agents. ASA Grade Assessment: II - A patient with                            mild systemic disease. After reviewing the risks                            and benefits, the patient was deemed in  satisfactory condition to undergo the procedure.                           After obtaining informed consent, the colonoscope                            was passed under direct vision. Throughout the                            procedure, the patient's blood pressure, pulse, and                            oxygen saturations were monitored continuously. The                            Model PCF-H190L [id number]) scope was introduced                    through the anus and advanced to the the cecum,                            identified by appendiceal orifice and ileocecal                            valve. The ileocecal valve, appendiceal orifice,                            and rectum were photographed. The quality of the                            bowel preparation was good. The colonoscopy was                            performed without difficulty. The patient tolerated                            the procedure well. Scope In: 10:52:00 AM Scope Out: [id number] AM Scope Withdrawal Time: 0 hours 9 minutes 57 seconds  Total Procedure Duration: 0 hours 14 minutes 5 seconds  Findings:                 The perianal and digital rectal examinations were                            normal.                           A 5 mm polyp was found in the transverse colon. The                            polyp was sessile. The polyp was removed with a                            cold biopsy forceps. Resection and retrieval were  complete.                           Internal hemorrhoids were found during                            retroflexion. The hemorrhoids were small and Grade                            I (internal hemorrhoids that do not prolapse).                           The exam was otherwise without abnormality on                            direct and retroflexion views. Complications:            No immediate complications. Estimated blood loss:                            None. Estimated Blood Loss:     Estimated blood loss: none. Impression:               - One 5 mm polyp in the transverse colon, removed                            with a cold biopsy forceps. Resected and retrieved.                           - Internal hemorrhoids.                           - The examination was otherwise normal on direct                            and retroflexion views. Recommendation:           - Repeat colonoscopy in 5  years for surveillance.                           - Patient has a contact number available for                            emergencies. The signs and symptoms of potential                            delayed complications were discussed with the                            patient. Return to normal activities tomorrow.                            Written discharge instructions were provided to the                            patient.                           -  Resume previous diet.                           - Continue present medications.                           - Await pathology results. Ladene Artist, MD 04/03/2016 11:09:10 AM This report has been signed electronically.

## 2016-04-03 NOTE — Patient Instructions (Signed)
HANDOUTS GIVEN FOR POLYPS AND HEMORROIDS.   YOU HAD AN ENDOSCOPIC PROCEDURE TODAY AT [facility]:   Refer to the procedure report that was given to you for any specific questions about what was found during the examination.  If the procedure report does not answer your questions, please call your gastroenterologist to clarify.  If you requested that your care partner not be given the details of your procedure findings, then the procedure report has been included in a sealed envelope for you to review at your convenience later.  YOU SHOULD EXPECT: Some feelings of bloating in the abdomen. Passage of more gas than usual.  Walking can help get rid of the air that was put into your GI tract during the procedure and reduce the bloating. If you had a lower endoscopy (such as a colonoscopy or flexible sigmoidoscopy) you may notice spotting of blood in your stool or on the toilet paper. If you underwent a bowel prep for your procedure, you may not have a normal bowel movement for a few days.  Please Note:  You might notice some irritation and congestion in your nose or some drainage.  This is from the oxygen used during your procedure.  There is no need for concern and it should clear up in a day or so.  SYMPTOMS TO REPORT IMMEDIATELY:   Following lower endoscopy (colonoscopy or flexible sigmoidoscopy):  Excessive amounts of blood in the stool  Significant tenderness or worsening of abdominal pains  Swelling of the abdomen that is new, acute  Fever of 100F or higher   For urgent or emergent issues, a gastroenterologist can be reached at any hour by calling [phone].   DIET:  We do recommend a small meal at first, but then you may proceed to your regular diet.  Drink plenty of fluids but you should avoid alcoholic beverages for 24 hours.  ACTIVITY:  You should plan to take it easy for the rest of today and you should NOT DRIVE or use heavy machinery until tomorrow (because of  the sedation medicines used during the test).    FOLLOW UP: Our staff will call the number listed on your records the next business day following your procedure to check on you and address any questions or concerns that you may have regarding the information given to you following your procedure. If we do not reach you, we will leave a message.  However, if you are feeling well and you are not experiencing any problems, there is no need to return our call.  We will assume that you have returned to your regular daily activities without incident.  If any biopsies were taken you will be contacted by phone or by letter within the next 1-3 weeks.  Please call us at [phone] if you have not heard about the biopsies in 3 weeks.    SIGNATURES/CONFIDENTIALITY: You and/or your care partner have signed paperwork which will be entered into your electronic medical record.  These signatures attest to the fact that that the information above on your After Visit Summary has been reviewed and is understood.  Full responsibility of the confidentiality of this discharge information lies with you and/or your care-partner.

## 2016-04-03 NOTE — Progress Notes (Signed)
Called to room to assist during endoscopic procedure.  Patient ID and intended procedure confirmed with present staff. Received instructions for my participation in the procedure from the performing physician.  

## 2016-04-04 ENCOUNTER — Telehealth: Payer: Self-pay | Admitting: *Deleted

## 2016-04-04 NOTE — Telephone Encounter (Signed)
  Follow up Call-  Call back number 04/03/2016  Post procedure Call Back phone  # [phone]  Permission to leave phone message Yes  Some recent data might be hidden     Patient questions:  Do you have a fever, pain , or abdominal swelling? No. Pain Score  0 *  Have you tolerated food without any problems? Yes.    Have you been able to return to your normal activities? Yes.    Do you have any questions about your discharge instructions: Diet   No. Medications  No. Follow up visit  No.  Do you have questions or concerns about your Care? No.  Actions: * If pain score is 4 or above: No action needed, pain <4.

## 2016-04-16 ENCOUNTER — Encounter: Payer: Self-pay | Admitting: Gastroenterology

## 2016-04-17 ENCOUNTER — Ambulatory Visit (INDEPENDENT_AMBULATORY_CARE_PROVIDER_SITE_OTHER): Payer: Medicare Other | Admitting: Behavioral Health

## 2016-04-17 DIAGNOSIS — Z23 Encounter for immunization: Secondary | ICD-10-CM

## 2016-04-18 LAB — HM COLONOSCOPY

## 2016-04-20 DIAGNOSIS — C61 Malignant neoplasm of prostate: Secondary | ICD-10-CM | POA: Diagnosis not present

## 2016-04-27 DIAGNOSIS — N5201 Erectile dysfunction due to arterial insufficiency: Secondary | ICD-10-CM | POA: Diagnosis not present

## 2016-04-27 DIAGNOSIS — Z8546 Personal history of malignant neoplasm of prostate: Secondary | ICD-10-CM | POA: Diagnosis not present

## 2016-06-04 ENCOUNTER — Other Ambulatory Visit (INDEPENDENT_AMBULATORY_CARE_PROVIDER_SITE_OTHER): Payer: Medicare Other

## 2016-06-04 ENCOUNTER — Ambulatory Visit (INDEPENDENT_AMBULATORY_CARE_PROVIDER_SITE_OTHER): Payer: Medicare Other | Admitting: Internal Medicine

## 2016-06-04 ENCOUNTER — Encounter: Payer: Self-pay | Admitting: Internal Medicine

## 2016-06-04 VITALS — BP 130/84 | HR 55 | Temp 97.9°F | Resp 16 | Ht 73.0 in | Wt 211.0 lb

## 2016-06-04 DIAGNOSIS — E785 Hyperlipidemia, unspecified: Secondary | ICD-10-CM

## 2016-06-04 DIAGNOSIS — R7989 Other specified abnormal findings of blood chemistry: Secondary | ICD-10-CM | POA: Diagnosis not present

## 2016-06-04 DIAGNOSIS — N183 Chronic kidney disease, stage 3 unspecified: Secondary | ICD-10-CM

## 2016-06-04 DIAGNOSIS — R945 Abnormal results of liver function studies: Secondary | ICD-10-CM

## 2016-06-04 DIAGNOSIS — E2609 Other primary hyperaldosteronism: Secondary | ICD-10-CM

## 2016-06-04 DIAGNOSIS — R739 Hyperglycemia, unspecified: Secondary | ICD-10-CM

## 2016-06-04 DIAGNOSIS — I10 Essential (primary) hypertension: Secondary | ICD-10-CM

## 2016-06-04 LAB — BASIC METABOLIC PANEL
BUN: 21 mg/dL (ref 6–23)
CHLORIDE: 107 meq/L (ref 96–112)
CO2: 29 mEq/L (ref 19–32)
Calcium: 9.4 mg/dL (ref 8.4–10.5)
Creatinine, Ser: 1.41 mg/dL (ref 0.40–1.50)
GFR: 53.16 mL/min — ABNORMAL LOW (ref >60.00)
Glucose, Bld: 115 mg/dL — ABNORMAL HIGH (ref 70–99)
POTASSIUM: 4.9 meq/L (ref 3.5–5.1)
SODIUM: 141 meq/L (ref 135–145)

## 2016-06-04 LAB — HEPATIC FUNCTION PANEL
ALK PHOS: 75 U/L (ref 39–117)
ALT: 22 U/L (ref 0–53)
AST: 8 U/L (ref 0–37)
Albumin: 4.1 g/dL (ref 3.5–5.2)
BILIRUBIN DIRECT: 0.1 mg/dL (ref 0.0–0.3)
BILIRUBIN TOTAL: 0.6 mg/dL (ref 0.2–1.2)
Total Protein: 6.4 g/dL (ref 6.0–8.3)

## 2016-06-04 LAB — HEPATITIS C ANTIBODY: HCV AB: NEGATIVE

## 2016-06-04 LAB — LIPID PANEL
CHOL/HDL RATIO: 3
Cholesterol: 122 mg/dL (ref 0–200)
HDL: 42.6 mg/dL (ref >39.00)
LDL Cholesterol: 48 mg/dL (ref 0–99)
NONHDL: 79.01
TRIGLYCERIDES: 153 mg/dL — AB (ref 0.0–149.0)
VLDL: 30.6 mg/dL (ref 0.0–40.0)

## 2016-06-04 NOTE — Progress Notes (Signed)
Pre visit review using our clinic review tool, if applicable. No additional management support is needed unless otherwise documented below in the visit note. 

## 2016-06-04 NOTE — Patient Instructions (Signed)

## 2016-06-04 NOTE — Progress Notes (Signed)
Subjective:  Patient ID: Marcus Crawford, male    DOB: 24-Dec-1950  Age: 65 y.o. MRN: [id number]  CC: Hypertension and Hyperlipidemia   HPI Marcus Crawford presents for follow-up on hypertension with renal insufficiency and hyper cholesterolemia. He has a history of prostate cancer and sees urology every 6 months. He is also followed a couple times a year with nephrology. He has lost 3 pounds, intentionally, since I last saw him. He otherwise feels well and offers no complaints. He denies headache/blurred vision/chest pain/shortness of breath/palpitations/edema/fatigue. He is tolerating the statin therapy well with no muscle or joint aches.  Outpatient Medications Prior to Visit  Medication Sig Dispense Refill  . atorvastatin (LIPITOR) 40 MG tablet Take 1 tablet (40 mg total) by mouth daily. 90 tablet 3  . cholecalciferol (VITAMIN D) 1000 UNITS tablet Take 3,000 Units by mouth daily.     . RABEprazole (ACIPHEX) 20 MG tablet Take 1 tablet (20 mg total) by mouth every other day. 90 tablet 3  . sildenafil (REVATIO) 20 MG tablet Take 20 mg by mouth as needed.     Facility-Administered Medications Prior to Visit  Medication Dose Route Frequency Provider Last Rate Last Dose  . 0.9 %  sodium chloride infusion  500 mL Intravenous Continuous Ladene Artist, MD        ROS Review of Systems  Constitutional: Negative for activity change, appetite change, chills, diaphoresis, fatigue and unexpected weight change.  HENT: Negative.   Eyes: Negative for visual disturbance.  Respiratory: Negative for cough, choking, chest tightness, shortness of breath and stridor.   Cardiovascular: Negative for chest pain, palpitations and leg swelling.  Gastrointestinal: Negative.  Negative for abdominal pain, blood in stool, constipation, diarrhea, nausea and vomiting.  Endocrine: Negative.   Genitourinary: Negative.  Negative for difficulty urinating, dysuria, frequency and urgency.  Musculoskeletal: Negative for  arthralgias, back pain, joint swelling and myalgias.  Skin: Negative.   Allergic/Immunologic: Negative.   Neurological: Negative.  Negative for dizziness, weakness, light-headedness and headaches.  Hematological: Negative.  Negative for adenopathy. Does not bruise/bleed easily.  Psychiatric/Behavioral: Negative.     Objective:  BP 130/84 (BP Location: Left Arm, Patient Position: Sitting, Cuff Size: Normal)   Pulse (!) 55   Temp 97.9 F (36.6 C) (Oral)   Resp 16   Ht 6\' 1"  (1.854 m)   Wt 211 lb (95.7 kg)   SpO2 98%   BMI 27.84 kg/m   BP Readings from Last 3 Encounters:  06/04/16 130/84  04/03/16 122/83  01/12/16 140/82    Wt Readings from Last 3 Encounters:  06/04/16 211 lb (95.7 kg)  04/03/16 214 lb (97.1 kg)  03/20/16 214 lb 12.8 oz (97.4 kg)    Physical Exam  Constitutional: He is oriented to person, place, and time. No distress.  HENT:  Mouth/Throat: Oropharynx is clear and moist. No oropharyngeal exudate.  Eyes: Conjunctivae are normal. Right eye exhibits no discharge. Left eye exhibits no discharge. No scleral icterus.  Neck: Normal range of motion. Neck supple. No JVD present. No tracheal deviation present. No thyromegaly present.  Cardiovascular: Normal rate, regular rhythm, normal heart sounds and intact distal pulses.  Exam reveals no gallop and no friction rub.   No murmur heard. Pulmonary/Chest: Effort normal and breath sounds normal. No stridor. No respiratory distress. He has no wheezes. He has no rales. He exhibits no tenderness.  Abdominal: Soft. Bowel sounds are normal. He exhibits no distension and no mass. There is no tenderness. There is  no rebound and no guarding.  Musculoskeletal: Normal range of motion. He exhibits no edema, tenderness or deformity.  Lymphadenopathy:    He has no cervical adenopathy.  Neurological: He is oriented to person, place, and time.  Skin: Skin is warm and dry. No rash noted. He is not diaphoretic. No erythema. No pallor.    Vitals reviewed.   Lab Results  Component Value Date   WBC 6.3 08/10/2015   HGB 14.2 08/10/2015   HCT 41 08/10/2015   PLT 175 08/10/2015   GLUCOSE 115 (H) 06/04/2016   CHOL 122 06/04/2016   TRIG 153.0 (H) 06/04/2016   HDL 42.60 06/04/2016   LDLDIRECT 110.0 01/12/2016   LDLCALC 48 06/04/2016   ALT 22 06/04/2016   AST 8 06/04/2016   NA 141 06/04/2016   K 4.9 06/04/2016   CL 107 06/04/2016   CREATININE 1.41 06/04/2016   BUN 21 06/04/2016   CO2 29 06/04/2016   TSH 1.97 01/12/2016   PSA <0.01 09/23/2015   INR 1.12 02/28/2012   HGBA1C 6.1 01/12/2016   MICROALBUR 0.4 10/14/2008    Marcus Crawford Contrast  Result Date: 01/19/2016 CLINICAL DATA:  65 year old hypertensive male with pain above the left ear and left jaw extending to back of head which started 3 months ago. No injury. Prostate cancer post prostatectomy (2015). Initial encounter. EXAM: MRI HEAD WITHOUT CONTRAST TECHNIQUE: Multiplanar, multiecho pulse sequences of the brain and surrounding structures were obtained without intravenous contrast. COMPARISON:  None. FINDINGS: No acute infarct or intracranial hemorrhage. Very mild chronic microvascular changes. Mild global atrophy without hydrocephalus. No intracranial mass lesion noted on this unenhanced exam. Small right vertebral artery may end in a posterior inferior cerebellar artery distribution. Left vertebral artery and basilar artery are ectatic. Slight impression left lateral medulla. No compression of the cisternal aspect of the fifth cranial nerve noted. Minimal partial opacification inferior left mastoid air cells. Mild polypoid opacification inferior left maxillary sinus. Degenerative changes C1 occipital and C1-C2 articulation. No acute orbital abnormality. IMPRESSION: No acute infarct or intracranial hemorrhage. Very mild chronic microvascular changes. Mild global atrophy without hydrocephalus. No intracranial mass lesion noted on this unenhanced exam. Small right  vertebral artery may end in a posterior inferior cerebellar artery distribution. Left vertebral artery and basilar artery are ectatic. Slight impression left lateral medulla. No compression of the cisternal aspect of the fifth cranial nerve noted. Minimal partial opacification inferior left mastoid air cells. Mild polypoid opacification inferior left maxillary sinus. Degenerative changes C1 occipital and C1-C2 articulation. Electronically Signed   By: Genia Del M.D.   On: 01/19/2016 08:35    Assessment & Plan:   Burdett was seen today for hypertension and hyperlipidemia.  Diagnoses and all orders for this visit:  Essential hypertension- His blood pressure is well-controlled, electrolytes and renal function are stable. 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 -     Basic metabolic panel; Future  Kidney disease, chronic, stage III (GFR 30-59 ml/min)- his renal function has improved, he will continue to avoid nephrotoxic agents and will seek for strict blood pressure control. -     Basic metabolic panel; Future  Hyperglycemia- his A1c is slightly elevated at 6.1%, he is prediabetic, no medications are needed at this time to treat this. -     Basic metabolic panel; Future  LFT  elevation- liver enzymes are normal now and he is negative for hepatitis C, this was most likely fatty liver disease that has improved. -     Hepatitis C antibody; Future -     Hepatic function panel; Future  Hyperlipidemia with target LDL less than 130- he has achieved his LDL goal and is doing well on the statin. -     Lipid panel; Future   I am having Marcus. Jakubczak maintain his cholecalciferol, sildenafil, atorvastatin, and RABEprazole. We will continue to administer sodium chloride.  No orders of the defined types were placed in this  encounter.    Follow-up: Return in about 6 months (around 12/03/2016).  Scarlette Calico, MD

## 2016-06-05 ENCOUNTER — Encounter: Payer: Self-pay | Admitting: Internal Medicine

## 2016-07-17 DIAGNOSIS — H04123 Dry eye syndrome of bilateral lacrimal glands: Secondary | ICD-10-CM | POA: Diagnosis not present

## 2016-08-01 DIAGNOSIS — N183 Chronic kidney disease, stage 3 (moderate): Secondary | ICD-10-CM | POA: Diagnosis not present

## 2016-08-08 DIAGNOSIS — D631 Anemia in chronic kidney disease: Secondary | ICD-10-CM | POA: Diagnosis not present

## 2016-08-08 DIAGNOSIS — N2581 Secondary hyperparathyroidism of renal origin: Secondary | ICD-10-CM | POA: Diagnosis not present

## 2016-08-08 DIAGNOSIS — I129 Hypertensive chronic kidney disease with stage 1 through stage 4 chronic kidney disease, or unspecified chronic kidney disease: Secondary | ICD-10-CM | POA: Diagnosis not present

## 2016-08-08 DIAGNOSIS — N183 Chronic kidney disease, stage 3 (moderate): Secondary | ICD-10-CM | POA: Diagnosis not present

## 2016-08-08 DIAGNOSIS — Z8679 Personal history of other diseases of the circulatory system: Secondary | ICD-10-CM | POA: Diagnosis not present

## 2016-09-20 IMAGING — MR MR HEAD W/O CM
10 series · 48 of 48 positions shown · non-contrast
Comparison: None.

CLINICAL DATA: 65-year-old hypertensive male with pain above the
left ear and left jaw extending to back of head which started 3
months ago. No injury. Prostate cancer post prostatectomy (9625).
Initial encounter.

EXAM:
MRI HEAD WITHOUT CONTRAST
TECHNIQUE: Multiplanar, multiecho pulse sequences of the brain and surrounding
structures were obtained without intravenous contrast.

[Series 5: T1 · sagittal · 4.0mm · 0.75mm/px · 2 of 31 slices shown (1 of 2)]
[im 1/31]
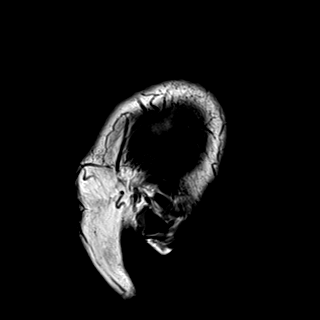
[im 31/31]
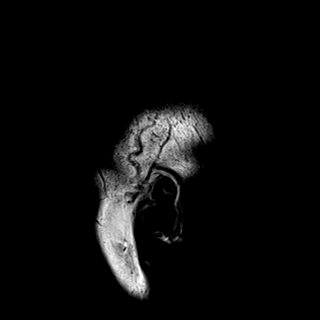

[Series 6: DWI · axial · 3.0mm · 1.44mm/px · z∈[-49,+88]mm · 7 of 86 slices shown (1 of 4)]
[im 1/86]
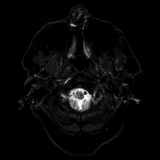
[im 15/86]
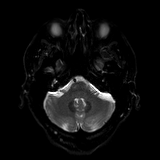
[im 29/86]
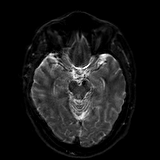
[im 43/86]
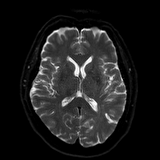
[im 57/86]
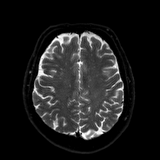
[im 71/86]
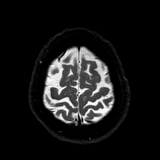
[im 86/86]
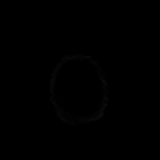

[Series 7: DWI · axial · 3.0mm · 1.44mm/px · z∈[-49,+88]mm · 4 of 43 slices shown (2 of 4)]
[im 1/43]
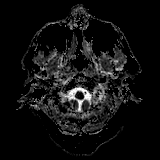
[im 15/43]
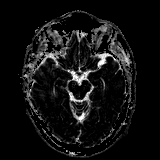
[im 29/43]
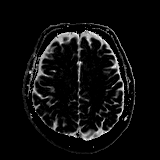
[im 43/43]
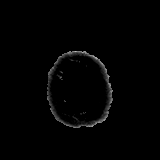

[Series 8: DWI · coronal · 5.0mm · 1.44mm/px · 5 of 60 slices shown (3 of 4)]
[im 1/60]
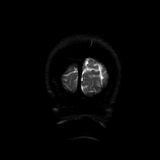
[im 15/60]
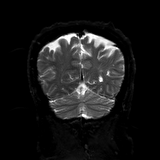
[im 30/60]
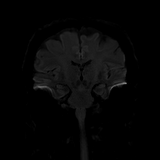
[im 45/60]
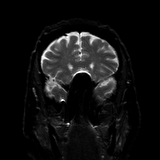
[im 60/60]
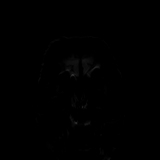

[Series 9: DWI · coronal · 5.0mm · 1.44mm/px · 3 of 30 slices shown (4 of 4)]
[im 1/30]
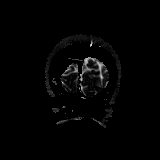
[im 15/30]
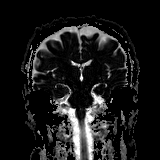
[im 30/30]
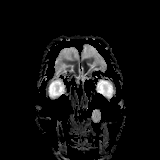

[Series 10: T2 · axial · 4.0mm · 0.36mm/px · z∈[-49,+84]mm · 2 of 27 slices shown (1 of 2)]
[im 1/27]
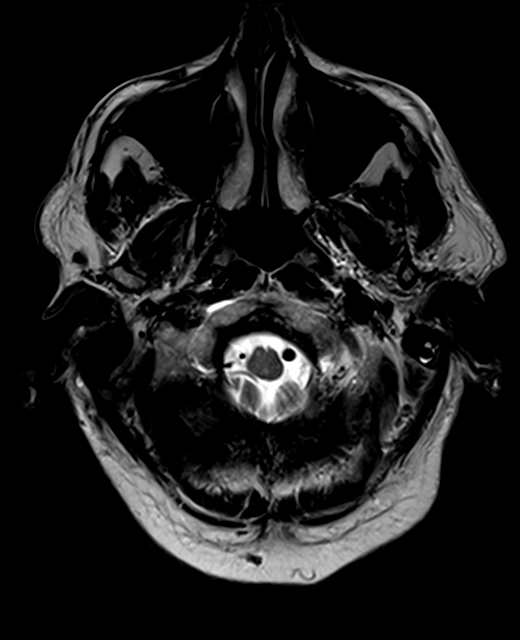
[im 27/27]
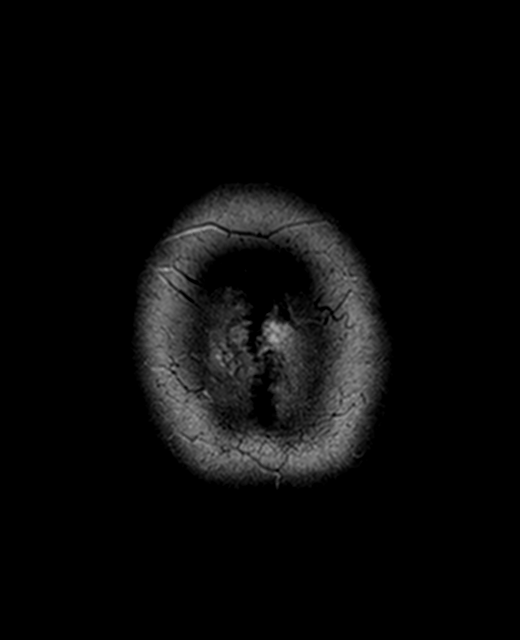

[Series 11: FLAIR · axial · 4.0mm · 0.72mm/px · z∈[-51,+83]mm · 2 of 27 slices shown]
[im 1/27]
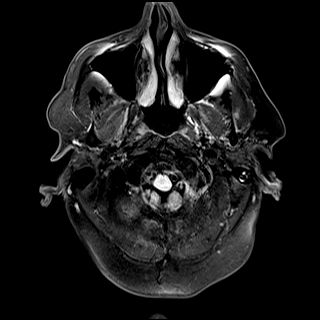
[im 27/27]
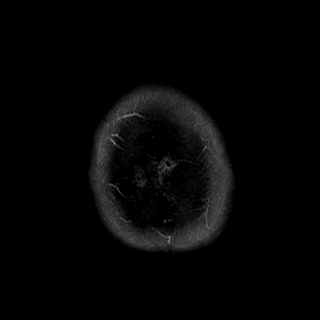

[Series 13: swi_images · axial · 1.5mm · 0.90mm/px · z∈[-53,+88]mm · 8 of 96 slices shown]
[im 1/96]
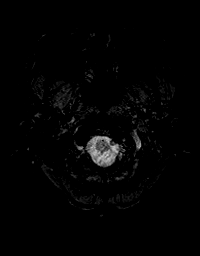
[im 14/96]
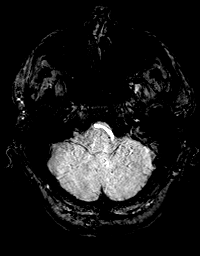
[im 28/96]
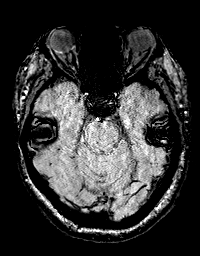
[im 41/96]
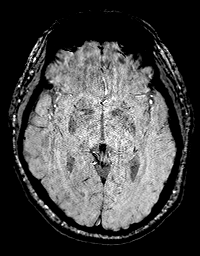
[im 55/96]
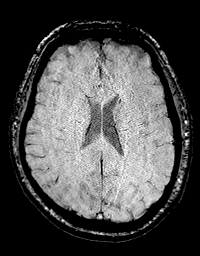
[im 68/96]
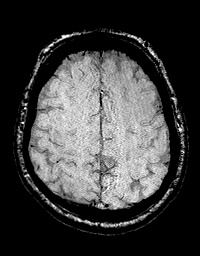
[im 82/96]
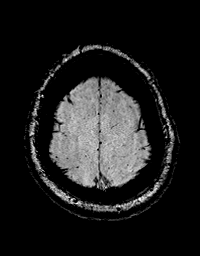
[im 96/96]
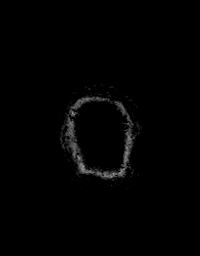

[Series 14: T1 · axial · 1.0mm · 0.90mm/px · z∈[-52,+89]mm · 12 of 144 slices shown (2 of 2)]
[im 1/144]
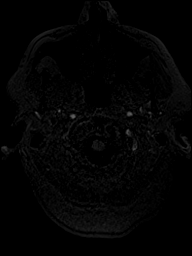
[im 14/144]
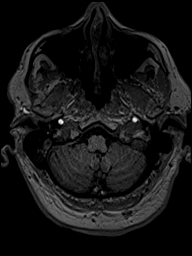
[im 27/144]
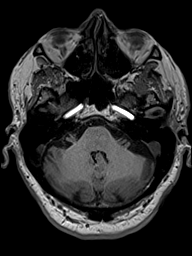
[im 40/144]
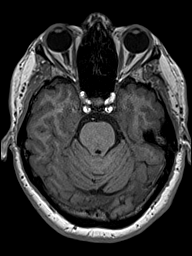
[im 53/144]
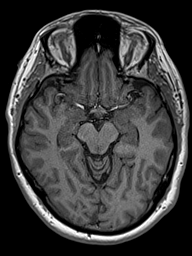
[im 66/144]
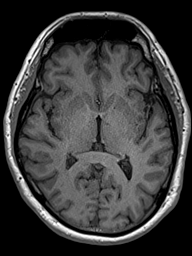
[im 79/144]
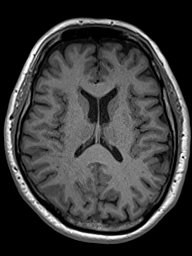
[im 92/144]
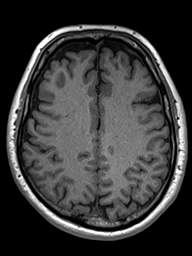
[im 105/144]
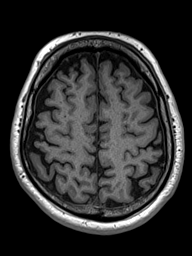
[im 118/144]
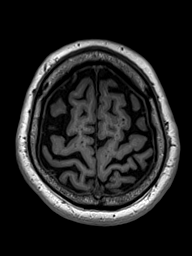
[im 131/144]
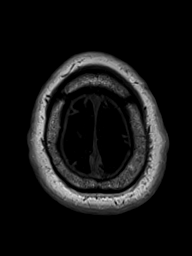
[im 144/144]
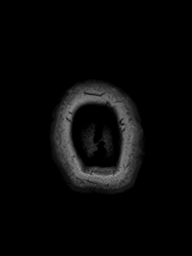

[Series 15: T2 · coronal · 4.5mm · 0.36mm/px · 3 of 30 slices shown (2 of 2)]
[im 1/30]
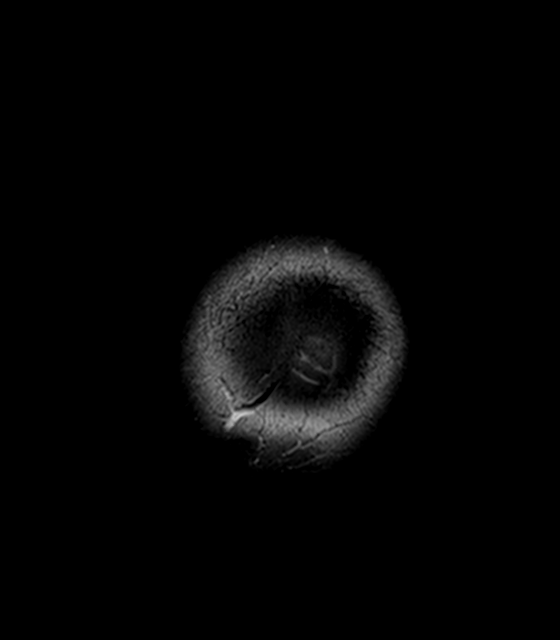
[im 15/30]
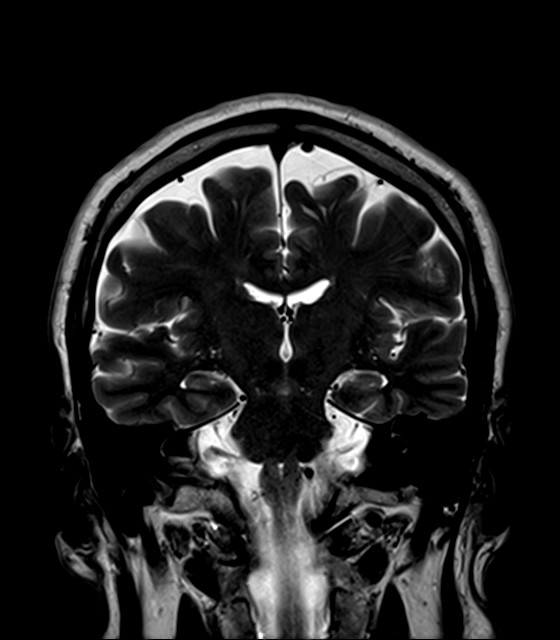
[im 30/30]
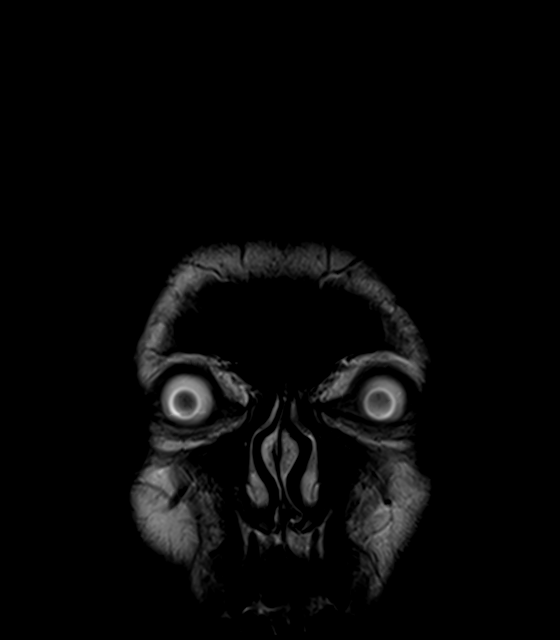

[48 of 48 positions shown; findings below may reference images not displayed]

FINDINGS: No acute infarct or intracranial hemorrhage.

Very mild chronic microvascular changes.

Mild global atrophy without hydrocephalus.

No intracranial mass lesion noted on this unenhanced exam.

Small right vertebral artery may end in a posterior inferior
cerebellar artery distribution. Left vertebral artery and basilar
artery are ectatic. Slight impression left lateral medulla. No
compression of the cisternal aspect of the fifth cranial nerve
noted.

Minimal partial opacification inferior left mastoid air cells. Mild
polypoid opacification inferior left maxillary sinus.

Degenerative changes C1 occipital and C1-C2 articulation.

No acute orbital abnormality.
IMPRESSION: No acute infarct or intracranial hemorrhage.

Very mild chronic microvascular changes.

Mild global atrophy without hydrocephalus.

No intracranial mass lesion noted on this unenhanced exam.

Small right vertebral artery may end in a posterior inferior
cerebellar artery distribution. Left vertebral artery and basilar
artery are ectatic. Slight impression left lateral medulla. No
compression of the cisternal aspect of the fifth cranial nerve
noted.

Minimal partial opacification inferior left mastoid air cells. Mild
polypoid opacification inferior left maxillary sinus.

Degenerative changes C1 occipital and C1-C2 articulation.

## 2016-10-15 DIAGNOSIS — Z8546 Personal history of malignant neoplasm of prostate: Secondary | ICD-10-CM | POA: Diagnosis not present

## 2016-10-15 LAB — PSA

## 2016-10-26 DIAGNOSIS — N5201 Erectile dysfunction due to arterial insufficiency: Secondary | ICD-10-CM | POA: Diagnosis not present

## 2016-10-26 DIAGNOSIS — Z8546 Personal history of malignant neoplasm of prostate: Secondary | ICD-10-CM | POA: Diagnosis not present

## 2016-12-24 ENCOUNTER — Telehealth: Payer: Self-pay | Admitting: Internal Medicine

## 2016-12-24 NOTE — Telephone Encounter (Signed)
Not able to order labs prior to labs due to lack of coverage.

## 2016-12-24 NOTE — Telephone Encounter (Signed)
Pt has an appointment for a physical on 01/14/2017. He wanted to know if lab orders could be put in for him to have done before this appointment?

## 2016-12-24 NOTE — Telephone Encounter (Signed)
Informed pt .

## 2017-01-11 ENCOUNTER — Telehealth: Payer: Self-pay | Admitting: Internal Medicine

## 2017-01-11 NOTE — Telephone Encounter (Signed)
Called pt 01/11/17, lvm, regarding CPE scheduled for 01/14/2017. Pt has Medicare Part A & B which does not cover CPE. Visit type needs to be changed. SF

## 2017-01-14 ENCOUNTER — Other Ambulatory Visit (INDEPENDENT_AMBULATORY_CARE_PROVIDER_SITE_OTHER): Payer: Medicare Other

## 2017-01-14 ENCOUNTER — Encounter: Payer: Self-pay | Admitting: Internal Medicine

## 2017-01-14 ENCOUNTER — Ambulatory Visit (INDEPENDENT_AMBULATORY_CARE_PROVIDER_SITE_OTHER): Payer: Medicare Other | Admitting: Internal Medicine

## 2017-01-14 VITALS — BP 120/80 | HR 72 | Temp 98.2°F | Resp 16 | Ht 73.0 in | Wt 211.0 lb

## 2017-01-14 DIAGNOSIS — E669 Obesity, unspecified: Secondary | ICD-10-CM | POA: Diagnosis not present

## 2017-01-14 DIAGNOSIS — N183 Chronic kidney disease, stage 3 unspecified: Secondary | ICD-10-CM

## 2017-01-14 DIAGNOSIS — E785 Hyperlipidemia, unspecified: Secondary | ICD-10-CM

## 2017-01-14 DIAGNOSIS — I10 Essential (primary) hypertension: Secondary | ICD-10-CM

## 2017-01-14 DIAGNOSIS — R7303 Prediabetes: Secondary | ICD-10-CM

## 2017-01-14 LAB — COMPREHENSIVE METABOLIC PANEL
ALK PHOS: 61 U/L (ref 39–117)
ALT: 21 U/L (ref 0–53)
AST: 8 U/L (ref 0–37)
Albumin: 4.2 g/dL (ref 3.5–5.2)
BILIRUBIN TOTAL: 0.7 mg/dL (ref 0.2–1.2)
BUN: 24 mg/dL — AB (ref 6–23)
CO2: 28 meq/L (ref 19–32)
Calcium: 9.5 mg/dL (ref 8.4–10.5)
Chloride: 105 mEq/L (ref 96–112)
Creatinine, Ser: 1.4 mg/dL (ref 0.40–1.50)
GFR: 53.5 mL/min — AB (ref >60.00)
GLUCOSE: 94 mg/dL (ref 70–99)
POTASSIUM: 4.4 meq/L (ref 3.5–5.1)
SODIUM: 140 meq/L (ref 135–145)
TOTAL PROTEIN: 6.2 g/dL (ref 6.0–8.3)

## 2017-01-14 LAB — CBC WITH DIFFERENTIAL/PLATELET
Basophils Absolute: 0 10*3/uL (ref 0.0–0.1)
Basophils Relative: 0.5 % (ref 0.0–3.0)
EOS PCT: 1.9 % (ref 0.0–5.0)
Eosinophils Absolute: 0.1 10*3/uL (ref 0.0–0.7)
HCT: 42.1 % (ref 39.0–52.0)
Hemoglobin: 14.2 g/dL (ref 13.0–17.0)
LYMPHS ABS: 1.7 10*3/uL (ref 0.7–4.0)
Lymphocytes Relative: 22.4 % (ref 12.0–46.0)
MCHC: 33.6 g/dL (ref 30.0–36.0)
MCV: 90.3 fl (ref 78.0–100.0)
MONO ABS: 0.5 10*3/uL (ref 0.1–1.0)
MONOS PCT: 6.7 % (ref 3.0–12.0)
NEUTROS ABS: 5.2 10*3/uL (ref 1.4–7.7)
NEUTROS PCT: 68.5 % (ref 43.0–77.0)
PLATELETS: 167 10*3/uL (ref 150.0–400.0)
RBC: 4.67 Mil/uL (ref 4.22–5.81)
RDW: 13.4 % (ref 11.5–15.5)
WBC: 7.6 10*3/uL (ref 4.0–10.5)

## 2017-01-14 LAB — LIPID PANEL
Cholesterol: 125 mg/dL (ref 0–200)
HDL: 46.4 mg/dL (ref >39.00)
LDL Cholesterol: 47 mg/dL (ref 0–99)
NonHDL: 78.24
TRIGLYCERIDES: 155 mg/dL — AB (ref 0.0–149.0)
Total CHOL/HDL Ratio: 3
VLDL: 31 mg/dL (ref 0.0–40.0)

## 2017-01-14 LAB — TSH: TSH: 1.95 u[IU]/mL (ref 0.35–4.50)

## 2017-01-14 LAB — HEMOGLOBIN A1C: HEMOGLOBIN A1C: 5.9 % (ref 4.6–6.5)

## 2017-01-14 NOTE — Progress Notes (Signed)
Subjective:  Patient ID: Marcus Crawford, male    DOB: 27-Mar-1951  Age: 66 y.o. MRN: [id number]  CC: Hyperlipidemia   HPI KASPER MUDRICK presents for f/up - he feels well, offers no complaints.  Outpatient Medications Prior to Visit  Medication Sig Dispense Refill  . atorvastatin (LIPITOR) 40 MG tablet Take 1 tablet (40 mg total) by mouth daily. 90 tablet 3  . cholecalciferol (VITAMIN D) 1000 UNITS tablet Take 3,000 Units by mouth daily.     . RABEprazole (ACIPHEX) 20 MG tablet Take 1 tablet (20 mg total) by mouth every other day. 90 tablet 3  . sildenafil (REVATIO) 20 MG tablet Take 20 mg by mouth as needed.    Marland Kitchen 0.9 %  sodium chloride infusion      No facility-administered medications prior to visit.     ROS Review of Systems  Constitutional: Negative.  Negative for chills, diaphoresis, fatigue, fever and unexpected weight change.  HENT: Negative.   Eyes: Negative for visual disturbance.  Respiratory: Negative.  Negative for cough, chest tightness, shortness of breath and wheezing.   Cardiovascular: Negative.  Negative for chest pain, palpitations and leg swelling.  Gastrointestinal: Negative for abdominal pain, constipation, diarrhea, nausea and vomiting.  Endocrine: Negative.   Genitourinary: Negative.  Negative for difficulty urinating.  Musculoskeletal: Negative.  Negative for arthralgias, back pain, myalgias and neck pain.  Skin: Negative.  Negative for color change.  Allergic/Immunologic: Negative.   Neurological: Negative.  Negative for dizziness, weakness and numbness.  Hematological: Negative for adenopathy. Does not bruise/bleed easily.  Psychiatric/Behavioral: Negative.     Objective:  BP 120/80 (BP Location: Left Arm, Patient Position: Sitting, Cuff Size: Normal)   Pulse 72   Temp 98.2 F (36.8 C) (Oral)   Resp 16   Ht 6\' 1"  (1.854 m)   Wt 211 lb (95.7 kg)   SpO2 98%   BMI 27.84 kg/m   BP Readings from Last 3 Encounters:  01/14/17 120/80  06/04/16  130/84  04/03/16 122/83    Wt Readings from Last 3 Encounters:  01/14/17 211 lb (95.7 kg)  06/04/16 211 lb (95.7 kg)  04/03/16 214 lb (97.1 kg)    Physical Exam  Constitutional: He is oriented to person, place, and time. No distress.  HENT:  Mouth/Throat: Oropharynx is clear and moist. No oropharyngeal exudate.  Eyes: Conjunctivae are normal. Right eye exhibits no discharge. Left eye exhibits no discharge. No scleral icterus.  Neck: Normal range of motion. Neck supple. No JVD present. No thyromegaly present.  Cardiovascular: Normal rate, regular rhythm and intact distal pulses.  Exam reveals no gallop and no friction rub.   No murmur heard. Pulmonary/Chest: Effort normal and breath sounds normal. No respiratory distress. He has no wheezes. He has no rales. He exhibits no tenderness.  Abdominal: Soft. Bowel sounds are normal. He exhibits no distension and no mass. There is no tenderness. There is no rebound and no guarding.  Musculoskeletal: Normal range of motion. He exhibits no edema, tenderness or deformity.  Lymphadenopathy:    He has no cervical adenopathy.  Neurological: He is alert and oriented to person, place, and time.  Skin: Skin is warm and dry. No rash noted. He is not diaphoretic. No erythema. No pallor.  Vitals reviewed.   Lab Results  Component Value Date   WBC 7.6 01/14/2017   HGB 14.2 01/14/2017   HCT 42.1 01/14/2017   PLT 167.0 01/14/2017   GLUCOSE 94 01/14/2017   CHOL 125 01/14/2017  TRIG 155.0 (H) 01/14/2017   HDL 46.40 01/14/2017   LDLDIRECT 110.0 01/12/2016   LDLCALC 47 01/14/2017   ALT 21 01/14/2017   AST 8 01/14/2017   NA 140 01/14/2017   K 4.4 01/14/2017   CL 105 01/14/2017   CREATININE 1.40 01/14/2017   BUN 24 (H) 01/14/2017   CO2 28 01/14/2017   TSH 1.95 01/14/2017   PSA <0.0 10/15/2016   INR 1.12 02/28/2012   HGBA1C 5.9 01/14/2017   MICROALBUR 0.4 10/14/2008    Mr Brain Wo Contrast  Result Date: 01/19/2016 CLINICAL DATA:   66 year old hypertensive male with pain above the left ear and left jaw extending to back of head which started 3 months ago. No injury. Prostate cancer post prostatectomy (2015). Initial encounter. EXAM: MRI HEAD WITHOUT CONTRAST TECHNIQUE: Multiplanar, multiecho pulse sequences of the brain and surrounding structures were obtained without intravenous contrast. COMPARISON:  None. FINDINGS: No acute infarct or intracranial hemorrhage. Very mild chronic microvascular changes. Mild global atrophy without hydrocephalus. No intracranial mass lesion noted on this unenhanced exam. Small right vertebral artery may end in a posterior inferior cerebellar artery distribution. Left vertebral artery and basilar artery are ectatic. Slight impression left lateral medulla. No compression of the cisternal aspect of the fifth cranial nerve noted. Minimal partial opacification inferior left mastoid air cells. Mild polypoid opacification inferior left maxillary sinus. Degenerative changes C1 occipital and C1-C2 articulation. No acute orbital abnormality. IMPRESSION: No acute infarct or intracranial hemorrhage. Very mild chronic microvascular changes. Mild global atrophy without hydrocephalus. No intracranial mass lesion noted on this unenhanced exam. Small right vertebral artery may end in a posterior inferior cerebellar artery distribution. Left vertebral artery and basilar artery are ectatic. Slight impression left lateral medulla. No compression of the cisternal aspect of the fifth cranial nerve noted. Minimal partial opacification inferior left mastoid air cells. Mild polypoid opacification inferior left maxillary sinus. Degenerative changes C1 occipital and C1-C2 articulation. Electronically Signed   By: Genia Del M.D.   On: 01/19/2016 08:35    Assessment & Plan:   Rhylee was seen today for hyperlipidemia.  Diagnoses and all orders for this visit:  Essential hypertension- His blood pressure is well-controlled,  electrolytes are normal, renal function is stable. -     Comprehensive metabolic panel; Future -     CBC with Differential/Platelet; Future  Prediabetes- improvement noted, his A1c is down to 5.9% -     Hemoglobin A1c; Future  Obesity, Class I, BMI 30-34.9- - he agrees to work on his lifestyle modifications to lose weight.  Kidney disease, chronic, stage III (GFR 30-59 ml/min)- his renal function has improved his blood pressure is well-controlled, he agrees to avoid nephrotoxic agents. -     Comprehensive metabolic panel; Future  Hyperlipidemia with target LDL less than 130- he has achieved his LDL goal is doing well on the statin. -     Lipid panel; Future -     TSH; Future   I am having Mr. Toto maintain his cholecalciferol, sildenafil, atorvastatin, and RABEprazole. We will stop administering sodium chloride.  No orders of the defined types were placed in this encounter.    Follow-up: Return in about 6 months (around 07/17/2017).  Scarlette Calico, MD

## 2017-01-14 NOTE — Patient Instructions (Signed)

## 2017-01-15 ENCOUNTER — Other Ambulatory Visit: Payer: Self-pay | Admitting: Internal Medicine

## 2017-01-15 DIAGNOSIS — E785 Hyperlipidemia, unspecified: Secondary | ICD-10-CM

## 2017-02-01 ENCOUNTER — Ambulatory Visit: Payer: Medicare Other

## 2017-02-23 ENCOUNTER — Other Ambulatory Visit: Payer: Self-pay | Admitting: Internal Medicine

## 2017-04-16 ENCOUNTER — Ambulatory Visit (INDEPENDENT_AMBULATORY_CARE_PROVIDER_SITE_OTHER): Payer: Medicare Other | Admitting: Behavioral Health

## 2017-04-16 DIAGNOSIS — Z23 Encounter for immunization: Secondary | ICD-10-CM

## 2017-04-16 NOTE — Progress Notes (Signed)
Pre visit review using our clinic review tool, if applicable. No additional management support is needed unless otherwise documented below in the visit note.  Patient came in clinic today for influenza vaccination. IM injection was given in the left deltoid. Patient tolerated the injection well.

## 2017-05-10 DIAGNOSIS — H5203 Hypermetropia, bilateral: Secondary | ICD-10-CM | POA: Diagnosis not present

## 2017-05-10 DIAGNOSIS — H2513 Age-related nuclear cataract, bilateral: Secondary | ICD-10-CM | POA: Diagnosis not present

## 2017-08-13 DIAGNOSIS — H04123 Dry eye syndrome of bilateral lacrimal glands: Secondary | ICD-10-CM | POA: Diagnosis not present

## 2017-10-15 DIAGNOSIS — N2581 Secondary hyperparathyroidism of renal origin: Secondary | ICD-10-CM | POA: Diagnosis not present

## 2017-10-15 DIAGNOSIS — Z8679 Personal history of other diseases of the circulatory system: Secondary | ICD-10-CM | POA: Diagnosis not present

## 2017-10-15 DIAGNOSIS — D631 Anemia in chronic kidney disease: Secondary | ICD-10-CM | POA: Diagnosis not present

## 2017-10-15 DIAGNOSIS — N183 Chronic kidney disease, stage 3 (moderate): Secondary | ICD-10-CM | POA: Diagnosis not present

## 2017-10-15 DIAGNOSIS — I129 Hypertensive chronic kidney disease with stage 1 through stage 4 chronic kidney disease, or unspecified chronic kidney disease: Secondary | ICD-10-CM | POA: Diagnosis not present

## 2017-11-05 NOTE — Progress Notes (Addendum)
Subjective:   Marcus Crawford is a 67 y.o. male who presents for Medicare Annual/Subsequent preventive examination.  Review of Systems:  No ROS.  Medicare Wellness Visit. Additional risk factors are reflected in the social history.  Cardiac Risk Factors include: advanced age (>55men, >79 women);dyslipidemia;hypertension;male gender Sleep patterns: feels rested on waking, gets up 2 times nightly to void and sleeps 7-8 hours nightly.    Home Safety/Smoke Alarms: Feels safe in home. Smoke alarms in place.  Living environment; residence and Firearm Safety: 2-story house, no firearms.Lives with wife, no needs for DME, good support system Seat Belt Safety/Bike Helmet: Wears seat belt.   PSA-  Lab Results  Component Value Date   PSA <0.0 10/15/2016   PSA <0.01 09/23/2015   PSA 4.15 (H) 12/29/2012       Objective:    Vitals: BP 124/83   Pulse 60   Resp 17   Ht 6\' 1"  (1.854 m)   Wt 222 lb (100.7 kg)   SpO2 98%   BMI 29.29 kg/m   Body mass index is 29.29 kg/m.  Advanced Directives 11/06/2017 01/15/2016 09/07/2013 09/07/2013 09/01/2013 04/14/2012 02/28/2012  Does Patient Have a Medical Advance Directive? Yes Yes Patient has advance directive, copy in chart Patient has advance directive, copy in chart Patient has advance directive, copy in chart Patient does not have advance directive;Patient would not like information Patient has advance directive, copy not in chart  Type of Advance Directive [place];Living will [place];Living will Healthcare Power of [place] of [place];Living will - -  Does patient want to make changes to medical advance directive? - No - Patient declined No change requested - No change requested - -  Copy of [place] in Chart? No - copy requested Yes - - - - -  Pre-existing out of facility DNR order (yellow form or pink MOST form) - - - - No No -    Tobacco Social  History   Tobacco Use  Smoking Status Never Smoker  Smokeless Tobacco Never Used     Counseling given: Not Answered  Past Medical History:  Diagnosis Date  . Bell's palsy 1977  . Blood clot in vein 2005   left leg "blood clot"-treated as OP; no residual  . Borderline diabetic    no meds  . Cancer ([place]) 08/2013   prostate/ had surgery/ no chemo or radiation  . CKD (chronic kidney disease) stage 3, GFR 30-59 ml/min (HCC) saw dr webb 6 months ago   on rampiril for kidney function also  . Colonic polyp    X3 ,hyperplastic  . Diverticulosis of colon   . GERD (gastroesophageal reflux disease)   . H/O hiatal hernia   . Hemorrhoid   . Hyperlipidemia   . Hypertension   . Kidney stone on left side    asymptomatic , incidental finding  . Migraine last 6-7 yrs ago   PMH of ; resolved post adrenal adenoma resection  . Nephrolithiasis 2013   kidney stone  as incidental finding on imaging  . PONV (postoperative nausea and vomiting)    Past Surgical History:  Procedure Laterality Date  . adrenal venous sampling  03-2012  . ADRENALECTOMY  04/14/2012   Procedure: ADRENALECTOMY;  Surgeon: Ralene Ok, MD;  Location: [facility];  Service: General;  Laterality: Left;  Left Adrenal Excision  . APPENDECTOMY  1985  . COLONOSCOPY     last 9/12, Dr Fuller Plan;  due 2017  . ESOPHAGEAL DILATION  2001   Dr Velora Heckler  . POLYPECTOMY      X 3  . ROBOT ASSISTED LAPAROSCOPIC RADICAL PROSTATECTOMY N/A 09/07/2013   Procedure: ROBOTIC ASSISTED LAPAROSCOPIC RADICAL PROSTATECTOMY LEVEL 1;  Surgeon: Dutch Gray, MD;  Location: [facility];  Service: Urology;  Laterality: N/A;   Family History  Problem Relation Age of Onset  . Heart failure Brother   . Kidney cancer Brother   . Colon cancer Mother 82  . Heart attack Mother 31  . Prostate cancer Brother 34  . Heart disease Brother   . Hypertension Brother   . Lung cancer Sister        smoker  . Kidney cancer Brother   . Diabetes Sister   . Breast cancer Sister     . Diabetes Brother   . Heart attack Brother 28  . Alzheimer's disease Sister    Social History   Socioeconomic History  . Marital status: Married    Spouse name: Not on file  . Number of children: 0  . Years of education: Not on file  . Highest education level: Not on file  Occupational History  . Not on file  Social Needs  . Financial resource strain: Not hard at all  . Food insecurity:    Worry: Never true    Inability: Never true  . Transportation needs:    Medical: No    Non-medical: No  Tobacco Use  . Smoking status: Never Smoker  . Smokeless tobacco: Never Used  Substance and Sexual Activity  . Alcohol use: Yes    Alcohol/week: 0.0 oz    Comment: 1 every 3 mos  . Drug use: No  . Sexual activity: Yes  Lifestyle  . Physical activity:    Days per week: 6 days    Minutes per session: 60 min  . Stress: Not at all  Relationships  . Social connections:    Talks on phone: More than three times a week    Gets together: More than three times a week    Attends religious service: 1 to 4 times per year    Active member of club or organization: Yes    Attends meetings of clubs or organizations: 1 to 4 times per year    Relationship status: Married  Other Topics Concern  . Not on file  Social History Narrative  . Not on file    Outpatient Encounter Medications as of 11/06/2017  Medication Sig  . atorvastatin (LIPITOR) 40 MG tablet take 1 tablet by mouth daily  . cholecalciferol (VITAMIN D) 1000 UNITS tablet Take 3,000 Units by mouth daily.   . RABEprazole (ACIPHEX) 20 MG tablet take 1 tablet by mouth every other day  . sildenafil (REVATIO) 20 MG tablet Take 20 mg by mouth as needed.   No facility-administered encounter medications on file as of 11/06/2017.     Activities of Daily Living In your present state of health, do you have any difficulty performing the following activities: 11/06/2017  Hearing? N  Vision? N  Difficulty concentrating or making decisions? N   Walking or climbing stairs? N  Dressing or bathing? N  Doing errands, shopping? N  Preparing Food and eating ? N  Using the Toilet? N  In the past six months, have you accidently leaked urine? N  Do you have problems with loss of bowel control? N  Managing your Medications? N  Managing your Finances? N  Housekeeping or managing your  Housekeeping? N  Some recent data might be hidden    Patient Care Team: Janith Lima, MD as PCP - General (Internal Medicine) Raynelle Bring, MD as Consulting Physician (Urology)   Assessment:   This is a routine wellness examination for [place]. Physical assessment deferred to PCP.   Exercise Activities and Dietary recommendations Current Exercise Habits: Home exercise routine, Type of exercise: walking, Time (Minutes): 60, Frequency (Times/Week): 6, Weekly Exercise (Minutes/Week): 360, Intensity: Mild, Exercise limited by: None identified  Diet (meal preparation, eat out, water intake, caffeinated beverages, dairy products, fruits and vegetables): in general, a "healthy" diet  , well balanced   Reviewed heart healthy diet, encouraged patient to increase daily water intake.   Goals    . Patient Stated     Maintain my current health status by continuing to exercise, eat healthy, and enjoy life.        Fall Risk Fall Risk  11/06/2017 01/15/2016  Falls in the past year? No No   Depression Screen PHQ 2/9 Scores 11/06/2017 01/15/2016  PHQ - 2 Score 0 0  PHQ- 9 Score 0 -    Cognitive Function       Ad8 score reviewed for issues:  Issues making decisions: no  Less interest in hobbies / activities: no  Repeats questions, stories (family complaining): no  Trouble using ordinary gadgets (microwave, computer, phone):no  Forgets the month or year: no  Mismanaging finances: no  Remembering appts: no  Daily problems with thinking and/or memory: no Ad8 score is= 0  Immunization History  Administered Date(s) Administered  . Influenza  Split 04/30/2011, 05/20/2012  . Influenza Whole 04/07/2009, 03/31/2010  . Influenza, High Dose Seasonal PF 04/17/2016, 04/16/2017  . Influenza,inj,Quad PF,6+ Mos 04/08/2013, 04/13/2014, 04/21/2015  . Pneumococcal Conjugate-13 01/12/2016  . Pneumococcal Polysaccharide-23 09/08/2013  . Tdap 09/13/2011  . Zoster 10/05/2008   Screening Tests Health Maintenance  Topic Date Due  . INFLUENZA VACCINE  01/30/2018  . COLONOSCOPY  04/18/2021  . TETANUS/TDAP  09/12/2021  . Hepatitis C Screening  Completed  . PNA vac Low Risk Adult  Completed        Plan:    Continue doing brain stimulating activities (puzzles, reading, adult coloring books, staying active) to keep memory sharp.   Continue to eat heart healthy diet (full of fruits, vegetables, whole grains, lean protein, water--limit salt, fat, and sugar intake) and increase physical activity as tolerated.   I have personally reviewed and noted the following in the patient's chart:   . Medical and social history . Use of alcohol, tobacco or illicit drugs  . Current medications and supplements . Functional ability and status . Nutritional status . Physical activity . Advanced directives . List of other physicians . Vitals . Screenings to include cognitive, depression, and falls . Referrals and appointments  In addition, I have reviewed and discussed with patient certain preventive protocols, quality metrics, and best practice recommendations. A written personalized care plan for preventive services as well as general preventive health recommendations were provided to patient.     Michiel Cowboy, RN  11/06/2017  Medical screening examination/treatment/procedure(s) were performed by non-physician practitioner and as supervising physician I was immediately available for consultation/collaboration. I agree with above. Scarlette Calico, MD

## 2017-11-06 ENCOUNTER — Ambulatory Visit (INDEPENDENT_AMBULATORY_CARE_PROVIDER_SITE_OTHER): Payer: Medicare Other | Admitting: *Deleted

## 2017-11-06 VITALS — BP 124/83 | HR 60 | Resp 17 | Ht 73.0 in | Wt 222.0 lb

## 2017-11-06 DIAGNOSIS — Z0000 Encounter for general adult medical examination without abnormal findings: Secondary | ICD-10-CM | POA: Diagnosis not present

## 2017-11-06 NOTE — Patient Instructions (Signed)
Continue doing brain stimulating activities (puzzles, reading, adult coloring books, staying active) to keep memory sharp.   Continue to eat heart healthy diet (full of fruits, vegetables, whole grains, lean protein, water--limit salt, fat, and sugar intake) and increase physical activity as tolerated.   [name] , Thank you for taking time to come for your Medicare Wellness Visit. I appreciate your ongoing commitment to your health goals. Please review the following plan we discussed and let me know if I can assist you in the future.   These are the goals we discussed: Goals    . Patient Stated     Maintain my current health status by continuing to exercise, eat healthy, and enjoy life.        This is a list of the screening recommended for you and due dates:  Health Maintenance  Topic Date Due  . Flu Shot  01/30/2018  . Colon Cancer Screening  04/18/2021  . Tetanus Vaccine  09/12/2021  .  Hepatitis C: One time screening is recommended by Center for Disease Control  (CDC) for  adults born from [birth year] through 1967.   Completed  . Pneumonia vaccines  Completed

## 2017-11-11 DIAGNOSIS — Z8546 Personal history of malignant neoplasm of prostate: Secondary | ICD-10-CM | POA: Diagnosis not present

## 2017-11-11 LAB — PSA: PSA: 0

## 2017-11-18 ENCOUNTER — Other Ambulatory Visit: Payer: Self-pay | Admitting: Internal Medicine

## 2017-11-18 DIAGNOSIS — K2100 Gastro-esophageal reflux disease with esophagitis, without bleeding: Secondary | ICD-10-CM

## 2017-11-18 MED ORDER — ESOMEPRAZOLE MAGNESIUM 40 MG PO CPDR: 40.0000 mg | DELAYED_RELEASE_CAPSULE | Freq: Every day | ORAL | 1 refills | Status: DC

## 2017-11-19 ENCOUNTER — Telehealth: Payer: Self-pay | Admitting: Internal Medicine

## 2017-11-19 NOTE — Telephone Encounter (Signed)
Pt called and was returning Stefannie's phone call he missed. Please call pt back, thanks.

## 2017-11-19 NOTE — Telephone Encounter (Signed)
Copied from [place] [id number]. Topic: Quick Communication - See Telephone Encounter >> Nov 19, 2017  8:25 AM Arletha Grippe wrote: CRM for notification. See Telephone encounter for: 11/19/17. Doug with [facility] called -  he needs to get a call back regarding PA for raberprazole sodium 20 mg Please call [phone]

## 2017-11-19 NOTE — Telephone Encounter (Signed)
LVM for pt to call back as soon as possible.   

## 2017-11-20 DIAGNOSIS — Z8546 Personal history of malignant neoplasm of prostate: Secondary | ICD-10-CM | POA: Diagnosis not present

## 2017-11-20 DIAGNOSIS — N5201 Erectile dysfunction due to arterial insufficiency: Secondary | ICD-10-CM | POA: Diagnosis not present

## 2017-11-20 NOTE — Telephone Encounter (Signed)
Pt called back and informed that he did want the PA initiated for the raberprazole sodium. The PA form has been completed, signed by PCP and faxed back to [facility].

## 2017-11-21 NOTE — Telephone Encounter (Signed)
PA for Aciphex (raberprazole 20mg ) was approved. Can you inform pt of same?

## 2017-11-22 NOTE — Telephone Encounter (Signed)
Informed patient

## 2017-12-16 ENCOUNTER — Other Ambulatory Visit: Payer: Self-pay | Admitting: Dermatology

## 2017-12-16 DIAGNOSIS — L820 Inflamed seborrheic keratosis: Secondary | ICD-10-CM | POA: Diagnosis not present

## 2017-12-16 DIAGNOSIS — D229 Melanocytic nevi, unspecified: Secondary | ICD-10-CM | POA: Diagnosis not present

## 2017-12-16 DIAGNOSIS — L821 Other seborrheic keratosis: Secondary | ICD-10-CM | POA: Diagnosis not present

## 2017-12-16 DIAGNOSIS — D485 Neoplasm of uncertain behavior of skin: Secondary | ICD-10-CM | POA: Diagnosis not present

## 2018-01-14 ENCOUNTER — Other Ambulatory Visit: Payer: Self-pay | Admitting: Internal Medicine

## 2018-01-14 DIAGNOSIS — E785 Hyperlipidemia, unspecified: Secondary | ICD-10-CM

## 2018-01-15 ENCOUNTER — Ambulatory Visit (INDEPENDENT_AMBULATORY_CARE_PROVIDER_SITE_OTHER): Payer: Medicare Other | Admitting: Internal Medicine

## 2018-01-15 ENCOUNTER — Other Ambulatory Visit (INDEPENDENT_AMBULATORY_CARE_PROVIDER_SITE_OTHER): Payer: Medicare Other

## 2018-01-15 ENCOUNTER — Encounter: Payer: Self-pay | Admitting: Internal Medicine

## 2018-01-15 VITALS — BP 138/86 | HR 56 | Temp 98.0°F | Resp 16 | Ht 73.0 in | Wt 220.0 lb

## 2018-01-15 DIAGNOSIS — R5383 Other fatigue: Secondary | ICD-10-CM

## 2018-01-15 DIAGNOSIS — E2609 Other primary hyperaldosteronism: Secondary | ICD-10-CM

## 2018-01-15 DIAGNOSIS — J301 Allergic rhinitis due to pollen: Secondary | ICD-10-CM

## 2018-01-15 DIAGNOSIS — K2100 Gastro-esophageal reflux disease with esophagitis, without bleeding: Secondary | ICD-10-CM

## 2018-01-15 DIAGNOSIS — E785 Hyperlipidemia, unspecified: Secondary | ICD-10-CM

## 2018-01-15 DIAGNOSIS — N183 Chronic kidney disease, stage 3 unspecified: Secondary | ICD-10-CM

## 2018-01-15 DIAGNOSIS — I10 Essential (primary) hypertension: Secondary | ICD-10-CM | POA: Diagnosis not present

## 2018-01-15 DIAGNOSIS — E559 Vitamin D deficiency, unspecified: Secondary | ICD-10-CM

## 2018-01-15 LAB — COMPREHENSIVE METABOLIC PANEL
ALBUMIN: 4.2 g/dL (ref 3.5–5.2)
ALK PHOS: 67 U/L (ref 39–117)
ALT: 28 U/L (ref 0–53)
AST: 10 U/L (ref 0–37)
BUN: 24 mg/dL — ABNORMAL HIGH (ref 6–23)
CHLORIDE: 105 meq/L (ref 96–112)
CO2: 27 mEq/L (ref 19–32)
Calcium: 9.2 mg/dL (ref 8.4–10.5)
Creatinine, Ser: 1.44 mg/dL (ref 0.40–1.50)
GFR: 51.63 mL/min — AB (ref >60.00)
Glucose, Bld: 117 mg/dL — ABNORMAL HIGH (ref 70–99)
POTASSIUM: 4.6 meq/L (ref 3.5–5.1)
SODIUM: 140 meq/L (ref 135–145)
TOTAL PROTEIN: 6.5 g/dL (ref 6.0–8.3)
Total Bilirubin: 0.5 mg/dL (ref 0.2–1.2)

## 2018-01-15 LAB — CBC WITH DIFFERENTIAL/PLATELET
BASOS PCT: 0.6 % (ref 0.0–3.0)
Basophils Absolute: 0 10*3/uL (ref 0.0–0.1)
EOS ABS: 0.1 10*3/uL (ref 0.0–0.7)
EOS PCT: 3 % (ref 0.0–5.0)
HCT: 42.5 % (ref 39.0–52.0)
Hemoglobin: 14.5 g/dL (ref 13.0–17.0)
LYMPHS ABS: 1.4 10*3/uL (ref 0.7–4.0)
Lymphocytes Relative: 28.3 % (ref 12.0–46.0)
MCHC: 34.1 g/dL (ref 30.0–36.0)
MCV: 89.5 fl (ref 78.0–100.0)
MONO ABS: 0.4 10*3/uL (ref 0.1–1.0)
Monocytes Relative: 7.7 % (ref 3.0–12.0)
NEUTROS PCT: 60.4 % (ref 43.0–77.0)
Neutro Abs: 3 10*3/uL (ref 1.4–7.7)
Platelets: 145 10*3/uL — ABNORMAL LOW (ref 150.0–400.0)
RBC: 4.74 Mil/uL (ref 4.22–5.81)
RDW: 13.2 % (ref 11.5–15.5)
WBC: 5 10*3/uL (ref 4.0–10.5)

## 2018-01-15 LAB — LIPID PANEL
CHOLESTEROL: 122 mg/dL (ref 0–200)
HDL: 40.9 mg/dL (ref >39.00)
LDL CALC: 53 mg/dL (ref 0–99)
NonHDL: 81.5
Total CHOL/HDL Ratio: 3
Triglycerides: 144 mg/dL (ref 0.0–149.0)
VLDL: 28.8 mg/dL (ref 0.0–40.0)

## 2018-01-15 LAB — VITAMIN D 25 HYDROXY (VIT D DEFICIENCY, FRACTURES): VITD: 53.34 ng/mL (ref 30.00–100.00)

## 2018-01-15 LAB — TSH: TSH: 2.5 u[IU]/mL (ref 0.35–4.50)

## 2018-01-15 MED ORDER — LEVOCETIRIZINE DIHYDROCHLORIDE 5 MG PO TABS: 5.0000 mg | ORAL_TABLET | Freq: Every evening | ORAL | 1 refills | Status: DC

## 2018-01-15 NOTE — Patient Instructions (Signed)

## 2018-01-15 NOTE — Progress Notes (Signed)
Subjective:  Patient ID: Marcus Crawford, male    DOB: Dec 28, 1950  Age: 67 y.o. MRN: [id number]  CC: Hyperlipidemia and Hypertension   HPI Marcus Crawford presents for f/up - Since I last saw him he has developed mild, nonexertional fatigue. He denies CP, DOE, palpitations, dizziness, lightheadedness, edema, or near syncope.  He also complains of a several month history of nasal congestion, runny nose, and postnasal drip.  He has not tried anything to control the symptoms.  He is not willing to use a nasal spray.   Outpatient Medications Prior to Visit  Medication Sig Dispense Refill  . atorvastatin (LIPITOR) 40 MG tablet take 1 tablet by mouth daily 90 tablet 3  . cholecalciferol (VITAMIN D) 1000 UNITS tablet Take 2,000 Units by mouth daily.     . RABEprazole (ACIPHEX) 20 MG tablet Take 20 mg by mouth daily.    . sildenafil (REVATIO) 20 MG tablet Take 20 mg by mouth as needed.    Marland Kitchen esomeprazole (NEXIUM) 40 MG capsule Take 1 capsule (40 mg total) by mouth daily. 30 capsule 1   No facility-administered medications prior to visit.     ROS Review of Systems  Constitutional: Positive for fatigue. Negative for diaphoresis and unexpected weight change.  HENT: Positive for congestion, postnasal drip and rhinorrhea. Negative for facial swelling, nosebleeds, sinus pressure, sinus pain, sneezing and sore throat.   Eyes: Negative for visual disturbance.  Respiratory: Negative for cough, chest tightness, shortness of breath and wheezing.   Gastrointestinal: Negative for abdominal pain, constipation, diarrhea, nausea and vomiting.  Genitourinary: Negative.  Negative for difficulty urinating, dysuria and hematuria.  Musculoskeletal: Negative.   Skin: Negative.  Negative for color change, pallor and rash.  Neurological: Negative.  Negative for dizziness, weakness and light-headedness.  Hematological: Negative for adenopathy. Does not bruise/bleed easily.  Psychiatric/Behavioral: Negative.      Objective:  BP 138/86   Pulse (!) 56   Temp 98 F (36.7 C) (Oral)   Resp 16   Ht 6\' 1"  (1.854 m)   Wt 220 lb (99.8 kg)   SpO2 94%   BMI 29.03 kg/m   BP Readings from Last 3 Encounters:  01/15/18 138/86  11/06/17 124/83  01/14/17 120/80    Wt Readings from Last 3 Encounters:  01/15/18 220 lb (99.8 kg)  11/06/17 222 lb (100.7 kg)  01/14/17 211 lb (95.7 kg)    Physical Exam  Constitutional: He is oriented to person, place, and time. No distress.  HENT:  Nose: Mucosal edema and rhinorrhea present. No epistaxis. Right sinus exhibits no maxillary sinus tenderness and no frontal sinus tenderness. Left sinus exhibits no maxillary sinus tenderness and no frontal sinus tenderness.  Mouth/Throat: Oropharynx is clear and moist. No oropharyngeal exudate.  Eyes: Conjunctivae are normal. No scleral icterus.  Neck: Normal range of motion. Neck supple. No JVD present. No thyromegaly present.  Cardiovascular: Regular rhythm, S1 normal, S2 normal and normal heart sounds. Bradycardia present. Exam reveals no gallop.  No murmur heard. EKG ---  Sinus  Bradycardia  WITHIN NORMAL LIMITS - no significant change compared to the prior EKG  Pulmonary/Chest: Effort normal and breath sounds normal. No respiratory distress. He has no wheezes. He has no rales.  Abdominal: Soft. Normal appearance and bowel sounds are normal. He exhibits no mass. There is no hepatosplenomegaly. There is no tenderness.  Musculoskeletal: Normal range of motion. He exhibits no edema, tenderness or deformity.  Lymphadenopathy:    He has no cervical adenopathy.  Neurological: He is alert and oriented to person, place, and time.  Skin: Skin is warm and dry. No rash noted. He is not diaphoretic.  Vitals reviewed.   Lab Results  Component Value Date   WBC 5.0 01/15/2018   HGB 14.5 01/15/2018   HCT 42.5 01/15/2018   PLT 145.0 (L) 01/15/2018   GLUCOSE 117 (H) 01/15/2018   CHOL 122 01/15/2018   TRIG 144.0 01/15/2018    HDL 40.90 01/15/2018   LDLDIRECT 110.0 01/12/2016   LDLCALC 53 01/15/2018   ALT 28 01/15/2018   AST 10 01/15/2018   NA 140 01/15/2018   K 4.6 01/15/2018   CL 105 01/15/2018   CREATININE 1.44 01/15/2018   BUN 24 (H) 01/15/2018   CO2 27 01/15/2018   TSH 2.50 01/15/2018   PSA 0.0 11/11/2017   INR 1.12 02/28/2012   HGBA1C 5.9 01/14/2017   MICROALBUR 0.4 10/14/2008    Mr Brain Wo Contrast  Result Date: 01/19/2016 CLINICAL DATA:  67 year old hypertensive male with pain above the left ear and left jaw extending to back of head which started 3 months ago. No injury. Prostate cancer post prostatectomy (2015). Initial encounter. EXAM: MRI HEAD WITHOUT CONTRAST TECHNIQUE: Multiplanar, multiecho pulse sequences of the brain and surrounding structures were obtained without intravenous contrast. COMPARISON:  None. FINDINGS: No acute infarct or intracranial hemorrhage. Very mild chronic microvascular changes. Mild global atrophy without hydrocephalus. No intracranial mass lesion noted on this unenhanced exam. Small right vertebral artery may end in a posterior inferior cerebellar artery distribution. Left vertebral artery and basilar artery are ectatic. Slight impression left lateral medulla. No compression of the cisternal aspect of the fifth cranial nerve noted. Minimal partial opacification inferior left mastoid air cells. Mild polypoid opacification inferior left maxillary sinus. Degenerative changes C1 occipital and C1-C2 articulation. No acute orbital abnormality. IMPRESSION: No acute infarct or intracranial hemorrhage. Very mild chronic microvascular changes. Mild global atrophy without hydrocephalus. No intracranial mass lesion noted on this unenhanced exam. Small right vertebral artery may end in a posterior inferior cerebellar artery distribution. Left vertebral artery and basilar artery are ectatic. Slight impression left lateral medulla. No compression of the cisternal aspect of the fifth cranial  nerve noted. Minimal partial opacification inferior left mastoid air cells. Mild polypoid opacification inferior left maxillary sinus. Degenerative changes C1 occipital and C1-C2 articulation. Electronically Signed   By: Genia Del M.D.   On: 01/19/2016 08:35    Assessment & Plan:   Chaze was seen today for hyperlipidemia and hypertension.  Diagnoses and all orders for this visit:  Essential hypertension- His blood pressure is adequately well controlled.  Electrolytes and renal function are normal.  I will monitor the primary aldosteronism with an aldosterone to renin activity ratio. -     CBC with Differential/Platelet; Future -     TSH; Future -     Aldosterone + renin activity w/ ratio; Future  Kidney disease, chronic, stage III (GFR 30-59 ml/min) ([place])- His renal function is stable.  Will continue to maintain good blood pressure control.  He will avoid nephrotoxic agents. -     CBC with Differential/Platelet; Future -     Comprehensive metabolic panel; Future  Hyperlipidemia with target LDL less than 130- He has achieved his LDL goal is doing well on the statin. -     Lipid panel; Future  Vitamin D deficiency -     VITAMIN D 25 Hydroxy (Vit-D Deficiency, Fractures); Future  Gastroesophageal reflux disease with esophagitis -  CBC with Differential/Platelet; Future  Primary hyperaldosteronism ([place]) -     Comprehensive metabolic panel; Future -     Aldosterone + renin activity w/ ratio; Future  Fatigue, unspecified type- He has had a stable bradycardia for nearly 5 years with a heart rate in the 50s.  His EKG today is unchanged.  I do not think the fatigue is related to cardiovascular disease.  I will check his labs to screen for secondary causes.  This most likely related to age and deconditioning. -     EKG 12-Lead  Seasonal allergic rhinitis due to pollen -     levocetirizine (XYZAL) 5 MG tablet; Take 1 tablet (5 mg total) by mouth every evening.   I have discontinued  Jeneen Rinks T. Mansfield's esomeprazole. I am also having him start on levocetirizine. Additionally, I am having him maintain his cholecalciferol, sildenafil, atorvastatin, and RABEprazole.  Meds ordered this encounter  Medications  . levocetirizine (XYZAL) 5 MG tablet    Sig: Take 1 tablet (5 mg total) by mouth every evening.    Dispense:  90 tablet    Refill:  1     Follow-up: Return in about 3 months (around 04/17/2018).  Scarlette Calico, MD

## 2018-01-17 ENCOUNTER — Encounter: Payer: Self-pay | Admitting: Internal Medicine

## 2018-01-19 LAB — ALDOSTERONE + RENIN ACTIVITY W/ RATIO
ALDO / PRA Ratio: 26.9 Ratio (ref 0.9–28.9)
ALDOSTERONE: 14 ng/dL
Renin Activity: 0.52 ng/mL/h (ref 0.25–5.82)

## 2018-01-20 ENCOUNTER — Encounter: Payer: Self-pay | Admitting: Internal Medicine

## 2018-01-20 ENCOUNTER — Telehealth: Payer: Self-pay | Admitting: Internal Medicine

## 2018-01-20 DIAGNOSIS — E785 Hyperlipidemia, unspecified: Secondary | ICD-10-CM

## 2018-01-20 NOTE — Telephone Encounter (Signed)
Copied from [place] [id number]. Topic: Quick Communication - Rx Refill/Question >> Jan 20, 2018 10:58 AM Marval Regal L wrote: Medication: atorvastatin (LIPITOR) 40 MG tablet [[id number]]   Has the patient contacted their pharmacy? Yes  Preferred Pharmacy (with phone number or street name):[id number] [address], [place] [phone] (Phone) [phone] (Fax)  Agent: Please be advised that RX refills may take up to 3 business days. We ask that you follow-up with your pharmacy.

## 2018-01-21 ENCOUNTER — Telehealth: Payer: Self-pay | Admitting: Internal Medicine

## 2018-01-21 MED ORDER — ATORVASTATIN CALCIUM 40 MG PO TABS: 40.0000 mg | ORAL_TABLET | Freq: Every day | ORAL | 3 refills | Status: DC

## 2018-01-21 NOTE — Telephone Encounter (Signed)
I called and left a message on his home voicemail  letting him know that his Lipitor Rx has been approved to be refilled and should be ready at the [facility] he has on file later today or tomorrow. If questions/problems call us back.

## 2018-01-23 ENCOUNTER — Other Ambulatory Visit: Payer: Self-pay | Admitting: Internal Medicine

## 2018-01-23 DIAGNOSIS — R5383 Other fatigue: Secondary | ICD-10-CM

## 2018-01-23 DIAGNOSIS — R001 Bradycardia, unspecified: Secondary | ICD-10-CM | POA: Insufficient documentation

## 2018-02-05 ENCOUNTER — Encounter: Payer: Self-pay | Admitting: Cardiovascular Disease

## 2018-02-05 ENCOUNTER — Ambulatory Visit (INDEPENDENT_AMBULATORY_CARE_PROVIDER_SITE_OTHER): Payer: Medicare Other | Admitting: Cardiovascular Disease

## 2018-02-05 DIAGNOSIS — R001 Bradycardia, unspecified: Secondary | ICD-10-CM

## 2018-02-05 DIAGNOSIS — I10 Essential (primary) hypertension: Secondary | ICD-10-CM

## 2018-02-05 DIAGNOSIS — E7800 Pure hypercholesterolemia, unspecified: Secondary | ICD-10-CM

## 2018-02-05 NOTE — Assessment & Plan Note (Signed)
History of hyperlipidemia on statin therapy with lipid profile performed 01/15/2018 revealing total cholesterol 22, LDL 53 and HDL 40.

## 2018-02-05 NOTE — Assessment & Plan Note (Signed)
History of sinus bradycardia with heart rate today of 85.  He did bring a log of his heart rates over the last several weeks which typically are in the 60s or 70s although an EKG that was performed in July showed his heart rate was 51.  I do not think this is contributory to his fatigue which is why he was referred.  He actually has started taking a multivitamin and his fatigue is significantly improved.  His hemoglobin is normal and his TSH is normal as well.  At this point, I do not feel compelled to pursue any further work-up and will see him back in 1 year for follow-up.

## 2018-02-05 NOTE — Patient Instructions (Signed)
Your physician wants you to follow-up in: ONE YEAR WITH DR BERRY You will receive a reminder letter in the mail two months in advance. If you don't receive a letter, please call our office to schedule the follow-up appointment.   If you need a refill on your cardiac medications before your next appointment, please call your pharmacy.  

## 2018-02-05 NOTE — Assessment & Plan Note (Signed)
History of essential hypertension her blood pressure measured at 130/78.  He was on antihypertensive medications until his adrenalectomy in 2013 after which his blood pressure was controlled on no medications.

## 2018-02-05 NOTE — Progress Notes (Signed)
02/05/2018 JARVIS SAWA   October 01, 1950  [id number]  Primary Physician Janith Lima, MD Primary Cardiologist: Lorretta Harp MD Lupe Carney, [place]  HPI:  Marcus Crawford is a 67 y.o. mildly overweight married Caucasian male with no children who is retired from working in [employer].  He was referred by Dr. Scarlette Calico for cardiovascular evaluation because of fatigue and bradycardia.  He does have a history of hypertension remotely on medications currently not as well as hyperlipidemia on statin therapy.  He had a remote adrenalectomy in 2013 as well as robotic prostatectomy for prostate cancer.  He is never had a heart attack or stroke.  He denies chest pain or shortness of breath.  He does walk 2 miles several days a week without limitation.  He is complained of fatigue for the last 3 months however since starting a multivitamin recently his fatigue is markedly improved.  His hemoglobin and TSH are normal as well.   Current Meds  Medication Sig  . atorvastatin (LIPITOR) 40 MG tablet Take 1 tablet (40 mg total) by mouth daily.  . cholecalciferol (VITAMIN D) 1000 UNITS tablet Take 2,000 Units by mouth daily.   Marland Kitchen levocetirizine (XYZAL) 5 MG tablet Take 1 tablet (5 mg total) by mouth every evening.  . RABEprazole (ACIPHEX) 20 MG tablet Take 20 mg by mouth every other day.   . sildenafil (REVATIO) 20 MG tablet Take 20 mg by mouth as needed.     Allergies  Allergen Reactions  . Penicillins     Rash (RN clarified with pt) No SOB/swelling  . Sulfa Antibiotics Rash    Social History   Socioeconomic History  . Marital status: Married    Spouse name: Not on file  . Number of children: 0  . Years of education: Not on file  . Highest education level: Not on file  Occupational History  . Not on file  Social Needs  . Financial resource strain: Not hard at all  . Food insecurity:    Worry: Never true    Inability: Never true  . Transportation needs:    Medical: No   Non-medical: No  Tobacco Use  . Smoking status: Never Smoker  . Smokeless tobacco: Never Used  Substance and Sexual Activity  . Alcohol use: Yes    Comment: 1 every 3 mos  . Drug use: No  . Sexual activity: Yes  Lifestyle  . Physical activity:    Days per week: 6 days    Minutes per session: 60 min  . Stress: Not at all  Relationships  . Social connections:    Talks on phone: More than three times a week    Gets together: More than three times a week    Attends religious service: 1 to 4 times per year    Active member of club or organization: Yes    Attends meetings of clubs or organizations: 1 to 4 times per year    Relationship status: Married  . Intimate partner violence:    Fear of current or ex partner: No    Emotionally abused: No    Physically abused: No    Forced sexual activity: No  Other Topics Concern  . Not on file  Social History Narrative  . Not on file     Review of Systems: General: negative for chills, fever, night sweats or weight changes.  Cardiovascular: negative for chest pain, dyspnea on exertion, edema, orthopnea, palpitations, paroxysmal nocturnal dyspnea  or shortness of breath Dermatological: negative for rash Respiratory: negative for cough or wheezing Urologic: negative for hematuria Abdominal: negative for nausea, vomiting, diarrhea, bright red blood per rectum, melena, or hematemesis Neurologic: negative for visual changes, syncope, or dizziness All other systems reviewed and are otherwise negative except as noted above.    Blood pressure 130/78, pulse 85, height 6\' 1"  (1.854 m), weight 221 lb 3.2 oz (100.3 kg), SpO2 96 %.  General appearance: alert and no distress Neck: no adenopathy, no carotid bruit, no JVD, supple, symmetrical, trachea midline and thyroid not enlarged, symmetric, no tenderness/mass/nodules Lungs: clear to auscultation bilaterally Heart: regular rate and rhythm, S1, S2 normal, no murmur, click, rub or  gallop Extremities: extremities normal, atraumatic, no cyanosis or edema Pulses: 2+ and symmetric Skin: Skin color, texture, turgor normal. No rashes or lesions Neurologic: Alert and oriented X 3, normal strength and tone. Normal symmetric reflexes. Normal coordination and gait  EKG not performed today  ASSESSMENT AND PLAN:   Bradycardia, sinus History of sinus bradycardia with heart rate today of 85.  He did bring a log of his heart rates over the last several weeks which typically are in the 60s or 70s although an EKG that was performed in July showed his heart rate was 51.  I do not think this is contributory to his fatigue which is why he was referred.  He actually has started taking a multivitamin and his fatigue is significantly improved.  His hemoglobin is normal and his TSH is normal as well.  At this point, I do not feel compelled to pursue any further work-up and will see him back in 1 year for follow-up.  Essential hypertension History of essential hypertension her blood pressure measured at 130/78.  He was on antihypertensive medications until his adrenalectomy in 2013 after which his blood pressure was controlled on no medications.  Hyperlipidemia History of hyperlipidemia on statin therapy with lipid profile performed 01/15/2018 revealing total cholesterol 22, LDL 53 and HDL 40.      Lorretta Harp MD FACP,FACC,FAHA, [facility] 02/05/2018 9:10 AM

## 2018-02-11 DIAGNOSIS — H04123 Dry eye syndrome of bilateral lacrimal glands: Secondary | ICD-10-CM | POA: Diagnosis not present

## 2018-04-21 ENCOUNTER — Ambulatory Visit (INDEPENDENT_AMBULATORY_CARE_PROVIDER_SITE_OTHER): Payer: Medicare Other

## 2018-04-21 DIAGNOSIS — Z23 Encounter for immunization: Secondary | ICD-10-CM | POA: Diagnosis not present

## 2018-05-20 ENCOUNTER — Other Ambulatory Visit: Payer: Self-pay | Admitting: Internal Medicine

## 2018-05-20 ENCOUNTER — Telehealth: Payer: Self-pay | Admitting: Internal Medicine

## 2018-05-20 DIAGNOSIS — K2100 Gastro-esophageal reflux disease with esophagitis, without bleeding: Secondary | ICD-10-CM

## 2018-05-20 MED ORDER — ESOMEPRAZOLE MAGNESIUM 40 MG PO CPDR: 40.0000 mg | DELAYED_RELEASE_CAPSULE | Freq: Every day | ORAL | 1 refills | Status: DC

## 2018-05-21 ENCOUNTER — Ambulatory Visit: Payer: Self-pay

## 2018-05-21 ENCOUNTER — Other Ambulatory Visit: Payer: Self-pay | Admitting: Internal Medicine

## 2018-05-21 DIAGNOSIS — K2100 Gastro-esophageal reflux disease with esophagitis, without bleeding: Secondary | ICD-10-CM

## 2018-05-21 MED ORDER — PANTOPRAZOLE SODIUM 40 MG PO TBEC: 40.0000 mg | DELAYED_RELEASE_TABLET | Freq: Every day | ORAL | 1 refills | Status: DC

## 2018-05-21 NOTE — Telephone Encounter (Signed)
Phone call returned to pt.  Reported he went to pick up Rx for Rabeprazole from the pharmacy, and was advised that the Rx had been changed to Protonix.  The pt. questioned why he was prescribed something different.  Advised that the order was changed due to the Rabeprazole not being covered by insurance.  The pt. stated "I have a letter from my insurance co. stating the Rabeprazole is prior authorized through Nov 20, 2018."  The pt. stated he is taking Rabeprazole 20 mg. Qod.  Voiced concern also about need to go to Protonix 40 mg qd, when he is only taking the Rabeprazole 20 mg qod.  Advised will send message to his PCP.          Reason for Disposition . Caller has NON-URGENT medication question about med that PCP prescribed and triager unable to answer question  Answer Assessment - Initial Assessment Questions 1. SYMPTOMS: "Do you have any symptoms?"     no 2. SEVERITY: If symptoms are present, ask "Are they mild, moderate or severe?"     N/a  Requesting clarification on why Protonix was ordered.  Protocols used: MEDICATION QUESTION CALL-A-AH  Message from [employer] sent at 05/21/2018 11:50 AM EST   Summary: unknown medication question   Pt states that when he went to pharmacy to pick up some of his medication (he was aware of) and he was told that pantoprazole (PROTONIX) 40 MG tablet was prepared for him as well. Pt isn't sure what this medication is or what it is for and if he needs to be taking it or not.

## 2018-05-21 NOTE — Telephone Encounter (Signed)
Aciphex is approved for pt (through 10/2018)   Can pt keep taking the aciphex or is there another clinical reason why pt should take the protonix instead.

## 2018-05-22 MED ORDER — RABEPRAZOLE SODIUM 20 MG PO TBEC: 20.0000 mg | DELAYED_RELEASE_TABLET | Freq: Every day | ORAL | 1 refills | Status: DC

## 2018-05-22 NOTE — Telephone Encounter (Signed)
Spoke to pt and informed that I was not sure why but we received a request from the pharmacy to change the aciphex. I informed pt that I did have record of the approved PA for aciphex until 10/2018.

## 2018-05-22 NOTE — Telephone Encounter (Signed)
yes

## 2018-05-22 NOTE — Telephone Encounter (Signed)
Informed pt that aciphex will be added back to medication list and the protonix will be removed. Pt thanked me for my time.

## 2018-05-22 NOTE — Telephone Encounter (Signed)
Is it okay for pt to stay on the Aciphex? PA was approved.

## 2018-05-22 NOTE — Addendum Note (Signed)
Addended by: Aviva Signs M on: 05/22/2018 11:54 AM   Modules accepted: Orders

## 2018-08-25 DIAGNOSIS — H5203 Hypermetropia, bilateral: Secondary | ICD-10-CM | POA: Diagnosis not present

## 2018-08-25 DIAGNOSIS — H04123 Dry eye syndrome of bilateral lacrimal glands: Secondary | ICD-10-CM | POA: Diagnosis not present

## 2018-08-25 DIAGNOSIS — H2513 Age-related nuclear cataract, bilateral: Secondary | ICD-10-CM | POA: Diagnosis not present

## 2018-09-16 DIAGNOSIS — Z8546 Personal history of malignant neoplasm of prostate: Secondary | ICD-10-CM | POA: Diagnosis not present

## 2018-09-16 LAB — PSA: PSA: 0.015

## 2018-09-24 DIAGNOSIS — N5201 Erectile dysfunction due to arterial insufficiency: Secondary | ICD-10-CM | POA: Diagnosis not present

## 2018-09-24 DIAGNOSIS — Z8546 Personal history of malignant neoplasm of prostate: Secondary | ICD-10-CM | POA: Diagnosis not present

## 2018-12-10 ENCOUNTER — Other Ambulatory Visit: Payer: Self-pay | Admitting: Dermatology

## 2018-12-10 DIAGNOSIS — L820 Inflamed seborrheic keratosis: Secondary | ICD-10-CM | POA: Diagnosis not present

## 2018-12-10 DIAGNOSIS — L821 Other seborrheic keratosis: Secondary | ICD-10-CM | POA: Diagnosis not present

## 2018-12-10 DIAGNOSIS — I129 Hypertensive chronic kidney disease with stage 1 through stage 4 chronic kidney disease, or unspecified chronic kidney disease: Secondary | ICD-10-CM | POA: Diagnosis not present

## 2018-12-10 DIAGNOSIS — N183 Chronic kidney disease, stage 3 (moderate): Secondary | ICD-10-CM | POA: Diagnosis not present

## 2018-12-10 DIAGNOSIS — D485 Neoplasm of uncertain behavior of skin: Secondary | ICD-10-CM | POA: Diagnosis not present

## 2018-12-10 DIAGNOSIS — D631 Anemia in chronic kidney disease: Secondary | ICD-10-CM | POA: Diagnosis not present

## 2018-12-10 DIAGNOSIS — L570 Actinic keratosis: Secondary | ICD-10-CM | POA: Diagnosis not present

## 2018-12-10 DIAGNOSIS — D229 Melanocytic nevi, unspecified: Secondary | ICD-10-CM | POA: Diagnosis not present

## 2018-12-10 DIAGNOSIS — Z8679 Personal history of other diseases of the circulatory system: Secondary | ICD-10-CM | POA: Diagnosis not present

## 2018-12-10 DIAGNOSIS — N2581 Secondary hyperparathyroidism of renal origin: Secondary | ICD-10-CM | POA: Diagnosis not present

## 2018-12-15 ENCOUNTER — Telehealth: Payer: Self-pay | Admitting: *Deleted

## 2018-12-15 NOTE — Telephone Encounter (Signed)
Marcus Crawford, said he's not having any problem, will return prn.

## 2018-12-26 ENCOUNTER — Other Ambulatory Visit: Payer: Self-pay | Admitting: Internal Medicine

## 2018-12-26 DIAGNOSIS — E785 Hyperlipidemia, unspecified: Secondary | ICD-10-CM

## 2019-01-05 ENCOUNTER — Other Ambulatory Visit: Payer: Self-pay | Admitting: Internal Medicine

## 2019-01-05 DIAGNOSIS — E785 Hyperlipidemia, unspecified: Secondary | ICD-10-CM

## 2019-01-10 ENCOUNTER — Telehealth: Payer: Self-pay | Admitting: Internal Medicine

## 2019-01-10 DIAGNOSIS — E785 Hyperlipidemia, unspecified: Secondary | ICD-10-CM

## 2019-01-12 ENCOUNTER — Other Ambulatory Visit: Payer: Self-pay | Admitting: Internal Medicine

## 2019-01-12 DIAGNOSIS — E785 Hyperlipidemia, unspecified: Secondary | ICD-10-CM

## 2019-01-12 MED ORDER — ATORVASTATIN CALCIUM 40 MG PO TABS: 40.0000 mg | ORAL_TABLET | Freq: Every day | ORAL | 0 refills | Status: DC

## 2019-01-12 NOTE — Telephone Encounter (Signed)
Patient has appt 7/20.  States he will be out of this medication by 7/17.  Would like a few pills sent to the pharmacy to get him through until Monday.  Please follow up with patient in regard.

## 2019-01-12 NOTE — Telephone Encounter (Signed)
Called pt and verified what rx he needed.   Atorvastatin is requested. Pt has a follow up appt on 07/20 but will out of medication on 07/17. Please advise if refill can be sent in.

## 2019-01-19 ENCOUNTER — Other Ambulatory Visit: Payer: Self-pay

## 2019-01-19 ENCOUNTER — Ambulatory Visit (INDEPENDENT_AMBULATORY_CARE_PROVIDER_SITE_OTHER): Payer: Medicare Other | Admitting: Internal Medicine

## 2019-01-19 ENCOUNTER — Encounter: Payer: Self-pay | Admitting: Internal Medicine

## 2019-01-19 ENCOUNTER — Other Ambulatory Visit (INDEPENDENT_AMBULATORY_CARE_PROVIDER_SITE_OTHER): Payer: Medicare Other

## 2019-01-19 VITALS — BP 138/88 | HR 62 | Temp 97.8°F | Resp 16 | Ht 73.0 in | Wt 221.0 lb

## 2019-01-19 DIAGNOSIS — D3500 Benign neoplasm of unspecified adrenal gland: Secondary | ICD-10-CM | POA: Diagnosis not present

## 2019-01-19 DIAGNOSIS — N183 Chronic kidney disease, stage 3 unspecified: Secondary | ICD-10-CM

## 2019-01-19 DIAGNOSIS — I10 Essential (primary) hypertension: Secondary | ICD-10-CM

## 2019-01-19 DIAGNOSIS — C61 Malignant neoplasm of prostate: Secondary | ICD-10-CM | POA: Diagnosis not present

## 2019-01-19 DIAGNOSIS — E2609 Other primary hyperaldosteronism: Secondary | ICD-10-CM

## 2019-01-19 DIAGNOSIS — R7303 Prediabetes: Secondary | ICD-10-CM

## 2019-01-19 DIAGNOSIS — E785 Hyperlipidemia, unspecified: Secondary | ICD-10-CM | POA: Insufficient documentation

## 2019-01-19 DIAGNOSIS — D696 Thrombocytopenia, unspecified: Secondary | ICD-10-CM

## 2019-01-19 DIAGNOSIS — E559 Vitamin D deficiency, unspecified: Secondary | ICD-10-CM

## 2019-01-19 DIAGNOSIS — R001 Bradycardia, unspecified: Secondary | ICD-10-CM | POA: Diagnosis not present

## 2019-01-19 LAB — HEPATIC FUNCTION PANEL
ALT: 20 U/L (ref 0–53)
AST: 8 U/L (ref 0–37)
Albumin: 4.4 g/dL (ref 3.5–5.2)
Alkaline Phosphatase: 77 U/L (ref 39–117)
Bilirubin, Direct: 0.1 mg/dL (ref 0.0–0.3)
Total Bilirubin: 0.6 mg/dL (ref 0.2–1.2)
Total Protein: 6.4 g/dL (ref 6.0–8.3)

## 2019-01-19 LAB — LIPID PANEL
Cholesterol: 132 mg/dL (ref 0–200)
HDL: 37.1 mg/dL — ABNORMAL LOW (ref >39.00)
NonHDL: 94.51
Total CHOL/HDL Ratio: 4
Triglycerides: 209 mg/dL — ABNORMAL HIGH (ref 0.0–149.0)
VLDL: 41.8 mg/dL — ABNORMAL HIGH (ref 0.0–40.0)

## 2019-01-19 LAB — VITAMIN D 25 HYDROXY (VIT D DEFICIENCY, FRACTURES): VITD: 51.22 ng/mL (ref 30.00–100.00)

## 2019-01-19 LAB — CBC WITH DIFFERENTIAL/PLATELET
Basophils Absolute: 0.1 10*3/uL (ref 0.0–0.1)
Basophils Relative: 1 % (ref 0.0–3.0)
Eosinophils Absolute: 0.2 10*3/uL (ref 0.0–0.7)
Eosinophils Relative: 3.3 % (ref 0.0–5.0)
HCT: 43 % (ref 39.0–52.0)
Hemoglobin: 14.5 g/dL (ref 13.0–17.0)
Lymphocytes Relative: 29.2 % (ref 12.0–46.0)
Lymphs Abs: 1.4 10*3/uL (ref 0.7–4.0)
MCHC: 33.7 g/dL (ref 30.0–36.0)
MCV: 89.6 fl (ref 78.0–100.0)
Monocytes Absolute: 0.4 10*3/uL (ref 0.1–1.0)
Monocytes Relative: 8.2 % (ref 3.0–12.0)
Neutro Abs: 2.8 10*3/uL (ref 1.4–7.7)
Neutrophils Relative %: 58.3 % (ref 43.0–77.0)
Platelets: 173 10*3/uL (ref 150.0–400.0)
RBC: 4.8 Mil/uL (ref 4.22–5.81)
RDW: 13.5 % (ref 11.5–15.5)
WBC: 4.9 10*3/uL (ref 4.0–10.5)

## 2019-01-19 LAB — TSH: TSH: 2.49 u[IU]/mL (ref 0.35–4.50)

## 2019-01-19 LAB — LDL CHOLESTEROL, DIRECT: Direct LDL: 58 mg/dL

## 2019-01-19 LAB — VITAMIN B12: Vitamin B-12: 313 pg/mL (ref 211–911)

## 2019-01-19 LAB — CORTISOL: Cortisol, Plasma: 10.6 ug/dL

## 2019-01-19 LAB — BASIC METABOLIC PANEL
BUN: 25 mg/dL — ABNORMAL HIGH (ref 6–23)
CO2: 27 mEq/L (ref 19–32)
Calcium: 9.3 mg/dL (ref 8.4–10.5)
Chloride: 105 mEq/L (ref 96–112)
Creatinine, Ser: 1.42 mg/dL (ref 0.40–1.50)
GFR: 49.23 mL/min — ABNORMAL LOW (ref >60.00)
Glucose, Bld: 111 mg/dL — ABNORMAL HIGH (ref 70–99)
Potassium: 4.5 mEq/L (ref 3.5–5.1)
Sodium: 140 mEq/L (ref 135–145)

## 2019-01-19 LAB — FOLATE: Folate: 14.4 ng/mL (ref >5.9)

## 2019-01-19 LAB — HEMOGLOBIN A1C: Hgb A1c MFr Bld: 6.2 % (ref 4.6–6.5)

## 2019-01-19 NOTE — Patient Instructions (Signed)

## 2019-01-19 NOTE — Progress Notes (Signed)
Subjective:  Patient ID: Marcus Crawford, male    DOB: 1951-02-21  Age: 68 y.o. MRN: [id number]  CC: Hypertension and Hyperlipidemia   HPI [name] presents for f/up - He walks 2 miles a day, 5 days a week.  He does not experience CP, DOE, palpitations, edema, or fatigue.  He is tolerating the statin well with no muscle or joint aches.  Outpatient Medications Prior to Visit  Medication Sig Dispense Refill  . atorvastatin (LIPITOR) 40 MG tablet Take 1 tablet (40 mg total) by mouth daily. 30 tablet 0  . cholecalciferol (VITAMIN D) 1000 UNITS tablet Take 2,000 Units by mouth daily.     Marland Kitchen levocetirizine (XYZAL) 5 MG tablet Take 1 tablet (5 mg total) by mouth every evening. 90 tablet 1  . RABEprazole (ACIPHEX) 20 MG tablet Take 1 tablet (20 mg total) by mouth daily. 90 tablet 1  . tadalafil (CIALIS) 20 MG tablet TAKE ONE TABLET BY MOUTH ONE TIME DAILY AS NEEDED AS DIRECTED    . sildenafil (REVATIO) 20 MG tablet Take 20 mg by mouth as needed.     No facility-administered medications prior to visit.     ROS Review of Systems  Constitutional: Negative for diaphoresis and fatigue.  HENT: Negative.  Negative for trouble swallowing.   Eyes: Negative for visual disturbance.  Respiratory: Negative for cough, chest tightness and wheezing.   Cardiovascular: Negative for chest pain, palpitations and leg swelling.  Gastrointestinal: Negative for abdominal pain, constipation, diarrhea, nausea and vomiting.  Genitourinary: Negative.  Negative for difficulty urinating, penile swelling, scrotal swelling and testicular pain.  Musculoskeletal: Negative for arthralgias and myalgias.  Skin: Negative.  Negative for color change.  Neurological: Negative.  Negative for dizziness, weakness and headaches.  Hematological: Negative for adenopathy. Does not bruise/bleed easily.  Psychiatric/Behavioral: Negative.     Objective:  BP 138/88 (BP Location: Left Arm, Patient Position: Sitting, Cuff Size:  Normal)   Pulse 62   Temp 97.8 F (36.6 C) (Oral)   Resp 16   Ht 6\' 1"  (1.854 m)   Wt 221 lb (100.2 kg)   SpO2 97%   BMI 29.16 kg/m   BP Readings from Last 3 Encounters:  01/19/19 138/88  02/05/18 130/78  01/15/18 138/86    Wt Readings from Last 3 Encounters:  01/19/19 221 lb (100.2 kg)  02/05/18 221 lb 3.2 oz (100.3 kg)  01/15/18 220 lb (99.8 kg)    Physical Exam Vitals signs reviewed.  Constitutional:      Appearance: He is not ill-appearing.  HENT:     Nose: Nose normal.     Mouth/Throat:     Mouth: Mucous membranes are moist.     Pharynx: No oropharyngeal exudate.  Eyes:     General: No scleral icterus.    Conjunctiva/sclera: Conjunctivae normal.  Neck:     Musculoskeletal: Normal range of motion. No neck rigidity.  Cardiovascular:     Rate and Rhythm: Normal rate and regular rhythm.     Heart sounds: No murmur.  Pulmonary:     Effort: Pulmonary effort is normal.     Breath sounds: No stridor. No wheezing, rhonchi or rales.  Abdominal:     General: Abdomen is flat. Bowel sounds are normal. There is no distension.     Palpations: There is no hepatomegaly or splenomegaly.     Tenderness: There is no abdominal tenderness.     Hernia: No hernia is present.  Musculoskeletal: Normal range of motion.  Right lower leg: No edema.     Left lower leg: No edema.  Lymphadenopathy:     Cervical: No cervical adenopathy.  Skin:    General: Skin is warm and dry.  Neurological:     General: No focal deficit present.     Mental Status: Mental status is at baseline.  Psychiatric:        Mood and Affect: Mood normal.        Behavior: Behavior normal.     Lab Results  Component Value Date   WBC 4.9 01/19/2019   HGB 14.5 01/19/2019   HCT 43.0 01/19/2019   PLT 173.0 01/19/2019   GLUCOSE 111 (H) 01/19/2019   CHOL 132 01/19/2019   TRIG 209.0 (H) 01/19/2019   HDL 37.10 (L) 01/19/2019   LDLDIRECT 58.0 01/19/2019   LDLCALC 53 01/15/2018   ALT 20 01/19/2019   AST  8 01/19/2019   NA 140 01/19/2019   K 4.5 01/19/2019   CL 105 01/19/2019   CREATININE 1.42 01/19/2019   BUN 25 (H) 01/19/2019   CO2 27 01/19/2019   TSH 2.49 01/19/2019   PSA 0.015 09/16/2018   INR 1.12 02/28/2012   HGBA1C 6.2 01/19/2019   MICROALBUR 0.4 10/14/2008    Mr Brain Wo Contrast  Result Date: 01/19/2016 CLINICAL DATA:  68 year old hypertensive male with pain above the left ear and left jaw extending to back of head which started 3 months ago. No injury. Prostate cancer post prostatectomy (2015). Initial encounter. EXAM: MRI HEAD WITHOUT CONTRAST TECHNIQUE: Multiplanar, multiecho pulse sequences of the brain and surrounding structures were obtained without intravenous contrast. COMPARISON:  None. FINDINGS: No acute infarct or intracranial hemorrhage. Very mild chronic microvascular changes. Mild global atrophy without hydrocephalus. No intracranial mass lesion noted on this unenhanced exam. Small right vertebral artery may end in a posterior inferior cerebellar artery distribution. Left vertebral artery and basilar artery are ectatic. Slight impression left lateral medulla. No compression of the cisternal aspect of the fifth cranial nerve noted. Minimal partial opacification inferior left mastoid air cells. Mild polypoid opacification inferior left maxillary sinus. Degenerative changes C1 occipital and C1-C2 articulation. No acute orbital abnormality. IMPRESSION: No acute infarct or intracranial hemorrhage. Very mild chronic microvascular changes. Mild global atrophy without hydrocephalus. No intracranial mass lesion noted on this unenhanced exam. Small right vertebral artery may end in a posterior inferior cerebellar artery distribution. Left vertebral artery and basilar artery are ectatic. Slight impression left lateral medulla. No compression of the cisternal aspect of the fifth cranial nerve noted. Minimal partial opacification inferior left mastoid air cells. Mild polypoid opacification  inferior left maxillary sinus. Degenerative changes C1 occipital and C1-C2 articulation. Electronically Signed   By: Genia Del M.D.   On: 01/19/2016 08:35    Assessment & Plan:   Sigmund was seen today for hypertension and hyperlipidemia.  Diagnoses and all orders for this visit:  Essential hypertension- His blood pressure is adequately well controlled.  Electrolytes and renal function are normal. -     Basic metabolic panel; Future -     TSH; Future -     Cancel: Urinalysis, Routine w reflex microscopic; Future  Bradycardia, sinus- His heart rate is normal now.  Adrenal adenoma, unspecified laterality- s/p resection.  Cortisol level is in the normal range.  There is no evidence of recurrence. 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  There is no evidence of hyperaldo at this time. -     Basic  metabolic panel; Future  Kidney disease, chronic, stage III (GFR 30-59 ml/min) ([place])- His renal function is stable.  He will avoid nephrotoxic agents. -     Basic metabolic panel; Future  Prediabetes- His A1c is at 6.2%.  He is prediabetic.  Medical therapy is not indicated. -     Basic metabolic panel; Future -     Hemoglobin A1c; Future  Vitamin D deficiency -     VITAMIN D 25 Hydroxy (Vit-D Deficiency, Fractures); Future  Prostate cancer [facility])- His recent PSA was low which is reassuring that he does not have prostate cancer. -     Cancel: PSA; Future  Thrombocytopenia ([place])- His platelet count is normal now.  I will screen him for B12 and folate deficiency. -     CBC with Differential/Platelet; Future -     Vitamin B12; Future -     Folate; Future  Hyperlipidemia with target LDL less than 130- He has achieved his LDL goal and is doing well on the statin. -     Hepatic function panel; Future -     TSH; Future -     Lipid panel; Future   I am having Jeneen Rinks T. Traweek maintain his cholecalciferol, sildenafil, levocetirizine, RABEprazole,  atorvastatin, and tadalafil.  No orders of the defined types were placed in this encounter.    Follow-up: Return in about 3 months (around 04/21/2019).  Scarlette Calico, MD

## 2019-01-22 ENCOUNTER — Telehealth: Payer: Self-pay | Admitting: Internal Medicine

## 2019-01-22 NOTE — Telephone Encounter (Signed)
Pt called in and stated that his RABEprazole (ACIPHEX) 20 MG tablet [[id number]]  Is need a PA done on it.  It requires one once a year.  Pt [facility]

## 2019-01-30 NOTE — Telephone Encounter (Signed)
PA submitted and the PA was already approved. GI may have completed the request. There was no documentation in chart

## 2019-02-16 ENCOUNTER — Other Ambulatory Visit: Payer: Self-pay | Admitting: Internal Medicine

## 2019-02-16 DIAGNOSIS — E785 Hyperlipidemia, unspecified: Secondary | ICD-10-CM

## 2019-02-16 MED ORDER — ATORVASTATIN CALCIUM 40 MG PO TABS: 40.0000 mg | ORAL_TABLET | Freq: Every day | ORAL | 1 refills | Status: DC

## 2019-03-17 ENCOUNTER — Other Ambulatory Visit: Payer: Self-pay

## 2019-03-17 ENCOUNTER — Ambulatory Visit (INDEPENDENT_AMBULATORY_CARE_PROVIDER_SITE_OTHER): Payer: Medicare Other

## 2019-03-17 DIAGNOSIS — Z23 Encounter for immunization: Secondary | ICD-10-CM | POA: Diagnosis not present

## 2019-03-27 DIAGNOSIS — H04123 Dry eye syndrome of bilateral lacrimal glands: Secondary | ICD-10-CM | POA: Diagnosis not present

## 2019-04-21 ENCOUNTER — Ambulatory Visit: Payer: Medicare Other | Admitting: Internal Medicine

## 2019-04-27 DIAGNOSIS — H04121 Dry eye syndrome of right lacrimal gland: Secondary | ICD-10-CM | POA: Diagnosis not present

## 2019-04-27 DIAGNOSIS — H04122 Dry eye syndrome of left lacrimal gland: Secondary | ICD-10-CM | POA: Diagnosis not present

## 2019-04-28 ENCOUNTER — Encounter: Payer: Self-pay | Admitting: Internal Medicine

## 2019-04-28 ENCOUNTER — Other Ambulatory Visit: Payer: Self-pay

## 2019-04-28 ENCOUNTER — Ambulatory Visit (INDEPENDENT_AMBULATORY_CARE_PROVIDER_SITE_OTHER): Payer: Medicare Other | Admitting: Internal Medicine

## 2019-04-28 VITALS — BP 136/86 | HR 87 | Temp 98.1°F | Resp 16 | Ht 73.0 in | Wt 218.0 lb

## 2019-04-28 DIAGNOSIS — E785 Hyperlipidemia, unspecified: Secondary | ICD-10-CM | POA: Diagnosis not present

## 2019-04-28 DIAGNOSIS — I10 Essential (primary) hypertension: Secondary | ICD-10-CM

## 2019-04-28 NOTE — Progress Notes (Signed)
Subjective:  Patient ID: Marcus Crawford, male    DOB: July 23, 1950  Age: 68 y.o. MRN: [id number]  CC: Hypertension   HPI Marcus Crawford presents for f/up - He tells me his blood pressure has been well controlled over the last 3 months.  He has not had a single home monitor check that has been over 130/80.  He walks 2 to 3 miles every other day and denies DOE, CP, palpitations, edema, or fatigue.  Outpatient Medications Prior to Visit  Medication Sig Dispense Refill   atorvastatin (LIPITOR) 40 MG tablet Take 1 tablet (40 mg total) by mouth daily. 90 tablet 1   cholecalciferol (VITAMIN D) 1000 UNITS tablet Take 2,000 Units by mouth daily.      levocetirizine (XYZAL) 5 MG tablet Take 1 tablet (5 mg total) by mouth every evening. 90 tablet 1   RABEprazole (ACIPHEX) 20 MG tablet Take 1 tablet (20 mg total) by mouth daily. 90 tablet 1   sildenafil (REVATIO) 20 MG tablet Take 20 mg by mouth as needed.     tadalafil (CIALIS) 20 MG tablet TAKE ONE TABLET BY MOUTH ONE TIME DAILY AS NEEDED AS DIRECTED     No facility-administered medications prior to visit.     ROS Review of Systems  Constitutional: Negative for diaphoresis and fatigue.  HENT: Negative.   Eyes: Negative.   Respiratory: Negative for chest tightness, shortness of breath and wheezing.   Cardiovascular: Negative for chest pain, palpitations and leg swelling.  Gastrointestinal: Negative for abdominal pain, diarrhea, nausea and vomiting.  Genitourinary: Negative.  Negative for difficulty urinating.  Musculoskeletal: Negative for arthralgias and myalgias.  Skin: Negative.  Negative for color change and pallor.  Neurological: Negative.  Negative for dizziness, weakness, light-headedness and headaches.  Hematological: Negative for adenopathy. Does not bruise/bleed easily.  Psychiatric/Behavioral: Negative.     Objective:  BP 136/86 (BP Location: Left Arm, Patient Position: Sitting, Cuff Size: Large)    Pulse 87    Temp 98.1  F (36.7 C) (Oral)    Resp 16    Ht 6\' 1"  (1.854 m)    Wt 218 lb (98.9 kg)    SpO2 98%    BMI 28.76 kg/m   BP Readings from Last 3 Encounters:  04/28/19 136/86  01/19/19 138/88  02/05/18 130/78    Wt Readings from Last 3 Encounters:  04/28/19 218 lb (98.9 kg)  01/19/19 221 lb (100.2 kg)  02/05/18 221 lb 3.2 oz (100.3 kg)    Physical Exam Vitals signs reviewed.  Constitutional:      Appearance: Normal appearance.  HENT:     Mouth/Throat:     Mouth: Mucous membranes are moist.  Eyes:     General: No scleral icterus.    Conjunctiva/sclera: Conjunctivae normal.  Neck:     Musculoskeletal: Normal range of motion.  Cardiovascular:     Rate and Rhythm: Normal rate and regular rhythm.     Heart sounds: No murmur.  Pulmonary:     Effort: Pulmonary effort is normal.     Breath sounds: No stridor. No wheezing, rhonchi or rales.  Abdominal:     General: Abdomen is flat. Bowel sounds are normal. There is no distension.     Palpations: There is no hepatomegaly or splenomegaly.  Musculoskeletal: Normal range of motion.     Right lower leg: No edema.     Left lower leg: No edema.  Lymphadenopathy:     Cervical: No cervical adenopathy.  Skin:  General: Skin is warm and dry.  Neurological:     General: No focal deficit present.     Mental Status: He is alert.  Psychiatric:        Mood and Affect: Mood normal.        Behavior: Behavior normal.     Lab Results  Component Value Date   WBC 4.9 01/19/2019   HGB 14.5 01/19/2019   HCT 43.0 01/19/2019   PLT 173.0 01/19/2019   GLUCOSE 111 (H) 01/19/2019   CHOL 132 01/19/2019   TRIG 209.0 (H) 01/19/2019   HDL 37.10 (L) 01/19/2019   LDLDIRECT 58.0 01/19/2019   LDLCALC 53 01/15/2018   ALT 20 01/19/2019   AST 8 01/19/2019   NA 140 01/19/2019   K 4.5 01/19/2019   CL 105 01/19/2019   CREATININE 1.42 01/19/2019   BUN 25 (H) 01/19/2019   CO2 27 01/19/2019   TSH 2.49 01/19/2019   PSA 0.015 09/16/2018   INR 1.12 02/28/2012    HGBA1C 6.2 01/19/2019   MICROALBUR 0.4 10/14/2008    Mr Brain Wo Contrast  Result Date: 01/19/2016 CLINICAL DATA:  68 year old hypertensive male with pain above the left ear and left jaw extending to back of head which started 3 months ago. No injury. Prostate cancer post prostatectomy (2015). Initial encounter. EXAM: MRI HEAD WITHOUT CONTRAST TECHNIQUE: Multiplanar, multiecho pulse sequences of the brain and surrounding structures were obtained without intravenous contrast. COMPARISON:  None. FINDINGS: No acute infarct or intracranial hemorrhage. Very mild chronic microvascular changes. Mild global atrophy without hydrocephalus. No intracranial mass lesion noted on this unenhanced exam. Small right vertebral artery may end in a posterior inferior cerebellar artery distribution. Left vertebral artery and basilar artery are ectatic. Slight impression left lateral medulla. No compression of the cisternal aspect of the fifth cranial nerve noted. Minimal partial opacification inferior left mastoid air cells. Mild polypoid opacification inferior left maxillary sinus. Degenerative changes C1 occipital and C1-C2 articulation. No acute orbital abnormality. IMPRESSION: No acute infarct or intracranial hemorrhage. Very mild chronic microvascular changes. Mild global atrophy without hydrocephalus. No intracranial mass lesion noted on this unenhanced exam. Small right vertebral artery may end in a posterior inferior cerebellar artery distribution. Left vertebral artery and basilar artery are ectatic. Slight impression left lateral medulla. No compression of the cisternal aspect of the fifth cranial nerve noted. Minimal partial opacification inferior left mastoid air cells. Mild polypoid opacification inferior left maxillary sinus. Degenerative changes C1 occipital and C1-C2 articulation. Electronically Signed   By: Genia Del M.D.   On: 01/19/2016 08:35    Assessment & Plan:   Marcus Crawford was seen today for  hypertension.  Diagnoses and all orders for this visit:  Essential hypertension- His blood pressure is adequately well controlled with lifestyle modifications.  Medical therapy is not indicated.   I am having Marcus Crawford maintain his cholecalciferol, sildenafil, levocetirizine, RABEprazole, tadalafil, and atorvastatin.  No orders of the defined types were placed in this encounter.    Follow-up: Return in about 6 months (around 10/27/2019).  Scarlette Calico, MD

## 2019-04-28 NOTE — Patient Instructions (Signed)

## 2019-06-01 DIAGNOSIS — H04123 Dry eye syndrome of bilateral lacrimal glands: Secondary | ICD-10-CM | POA: Diagnosis not present

## 2019-06-04 ENCOUNTER — Telehealth: Payer: Self-pay

## 2019-06-04 NOTE — Telephone Encounter (Signed)
Contacted pt and he stated that he was experiencing pain in his hips. He stopped the atorvastatin for about a week and now he is not experiencing the pain.   Please advise if you want to change and/or dc the atorvastatin.

## 2019-06-04 NOTE — Telephone Encounter (Signed)
Copied from [place] [phone]. Topic: General - Other >> Jun 04, 2019  9:36 AM Rainey Pines A wrote: Patient would like a callback from nurse to discuss cholesterol medication. Please advise

## 2019-06-05 ENCOUNTER — Other Ambulatory Visit: Payer: Self-pay | Admitting: Internal Medicine

## 2019-06-05 MED ORDER — LIVALO 1 MG PO TABS: 1.0000 | ORAL_TABLET | Freq: Every day | ORAL | 1 refills | Status: DC

## 2019-06-05 NOTE — Telephone Encounter (Signed)
I recommend that he try LIVALO This is usually better tolerated than the other statins Rx sent  [place]

## 2019-06-05 NOTE — Addendum Note (Signed)
Addended by: Janith Lima on: 06/05/2019 11:32 AM   Modules accepted: Orders

## 2019-06-12 ENCOUNTER — Telehealth: Payer: Self-pay | Admitting: Internal Medicine

## 2019-06-12 NOTE — Telephone Encounter (Signed)
Key: [facility]  PA has been approved and pt has been informed of same.

## 2019-06-12 NOTE — Telephone Encounter (Signed)
There is a previous note about this. Closing this note.

## 2019-06-12 NOTE — Telephone Encounter (Signed)
Pt was advised by the pharmacy that his insurance will not cover the Pitavastatin Calcium (LIVALO) 1 MG TABS  And that a PA is needed. Pharmacy advised Pt that they have tried to contact office with no success / Please advise

## 2019-06-16 ENCOUNTER — Encounter: Payer: Self-pay | Admitting: Internal Medicine

## 2019-06-17 ENCOUNTER — Other Ambulatory Visit: Payer: Self-pay | Admitting: Internal Medicine

## 2019-06-17 DIAGNOSIS — E785 Hyperlipidemia, unspecified: Secondary | ICD-10-CM

## 2019-06-17 MED ORDER — PRAVASTATIN SODIUM 10 MG PO TABS: 10.0000 mg | ORAL_TABLET | Freq: Every day | ORAL | 1 refills | Status: DC

## 2019-06-17 NOTE — Progress Notes (Addendum)
Subjective:   Marcus Crawford is a 68 y.o. male who presents for Medicare Annual/Subsequent preventive examination.  I connected with patient by a telephone and verified that I am speaking with the correct person using two identifiers. Patient stated full name and DOB. Patient gave permission to continue with telephonic visit. Patient's location was at home and Nurse's location was at [facility] office. Participants during this visit included patient and nurse.  Review of Systems:   Cardiac Risk Factors include: advanced age (>45men, >107 women);dyslipidemia Sleep patterns: feels rested on waking, gets up 1-2 times nightly to void and sleeps 7-8 hours nightly.    Home Safety/Smoke Alarms: Feels safe in home. Smoke alarms in place.  Living environment; residence and Firearm Safety: 1-story house/ trailer. Lives with wife, no needs for DME, good support system Seat Belt Safety/Bike Helmet: Wears seat belt.     Objective:    Vitals: There were no vitals taken for this visit.  There is no height or weight on file to calculate BMI.  Advanced Directives 06/18/2019 11/06/2017 01/15/2016 09/07/2013 09/07/2013 09/01/2013 04/14/2012  Does Patient Have a Medical Advance Directive? Yes Yes Yes Patient has advance directive, copy in chart Patient has advance directive, copy in chart Patient has advance directive, copy in chart Patient does not have advance directive;Patient would not like information  Type of Scientist, forensic Power of [place];Living will [place];Living will [place];Living will Healthcare Power of [place] of [place];Living will -  Does patient want to make changes to medical advance directive? - - No - Patient declined No change requested - No change requested -  Copy of [place] in Chart? No - copy requested No - copy requested Yes - - - -  Pre-existing out of facility DNR  order (yellow form or pink MOST form) - - - - - No No    Tobacco Social History   Tobacco Use  Smoking Status Never Smoker  Smokeless Tobacco Never Used     Counseling given: Not Answered  Past Medical History:  Diagnosis Date  . Bell's palsy 1977  . Blood clot in vein 2005   left leg "blood clot"-treated as OP; no residual  . Borderline diabetic    no meds  . Cancer ([place]) 08/2013   prostate/ had surgery/ no chemo or radiation  . CKD (chronic kidney disease) stage 3, GFR 30-59 ml/min saw dr webb 6 months ago   on rampiril for kidney function also  . Colonic polyp    X3 ,hyperplastic  . Diverticulosis of colon   . GERD (gastroesophageal reflux disease)   . H/O hiatal hernia   . Hemorrhoid   . Hyperlipidemia   . Hypertension   . Kidney stone on left side    asymptomatic , incidental finding  . Migraine last 6-7 yrs ago   PMH of ; resolved post adrenal adenoma resection  . Nephrolithiasis 2013   kidney stone  as incidental finding on imaging  . PONV (postoperative nausea and vomiting)    Past Surgical History:  Procedure Laterality Date  . adrenal venous sampling  03-2012  . ADRENALECTOMY  04/14/2012   Procedure: ADRENALECTOMY;  Surgeon: Ralene Ok, MD;  Location: [facility];  Service: General;  Laterality: Left;  Left Adrenal Excision  . APPENDECTOMY  1985  . COLONOSCOPY     last 9/12, Dr Fuller Plan; due 2017  . ESOPHAGEAL DILATION  2001   Dr  Parsons  . POLYPECTOMY      X 3  . ROBOT ASSISTED LAPAROSCOPIC RADICAL PROSTATECTOMY N/A 09/07/2013   Procedure: ROBOTIC ASSISTED LAPAROSCOPIC RADICAL PROSTATECTOMY LEVEL 1;  Surgeon: Dutch Gray, MD;  Location: [facility];  Service: Urology;  Laterality: N/A;   Family History  Problem Relation Age of Onset  . Heart failure Brother   . Kidney cancer Brother   . Colon cancer Mother 20  . Heart attack Mother 1  . Prostate cancer Brother 19  . Heart disease Brother   . Hypertension Brother   . Lung cancer Sister        smoker  .  Kidney cancer Brother   . Diabetes Sister   . Breast cancer Sister   . Diabetes Brother   . Heart attack Brother 59  . Alzheimer's disease Sister    Social History   Socioeconomic History  . Marital status: Married    Spouse name: Not on file  . Number of children: 0  . Years of education: Not on file  . Highest education level: Not on file  Occupational History  . Occupation: retired  Tobacco Use  . Smoking status: Never Smoker  . Smokeless tobacco: Never Used  Substance and Sexual Activity  . Alcohol use: Yes    Comment: 1 every 3 mos  . Drug use: No  . Sexual activity: Yes  Other Topics Concern  . Not on file  Social History Narrative  . Not on file   Social Determinants of Health   Financial Resource Strain:   . Difficulty of Paying Living Expenses: Not on file  Food Insecurity:   . Worried About Charity fundraiser in the Last Year: Not on file  . Ran Out of Food in the Last Year: Not on file  Transportation Needs:   . Lack of Transportation (Medical): Not on file  . Lack of Transportation (Non-Medical): Not on file  Physical Activity:   . Days of Exercise per Week: Not on file  . Minutes of Exercise per Session: Not on file  Stress:   . Feeling of Stress : Not on file  Social Connections:   . Frequency of Communication with Friends and Family: Not on file  . Frequency of Social Gatherings with Friends and Family: Not on file  . Attends Religious Services: Not on file  . Active Member of Clubs or Organizations: Not on file  . Attends Archivist Meetings: Not on file  . Marital Status: Not on file    Outpatient Encounter Medications as of 06/18/2019  Medication Sig  . cholecalciferol (VITAMIN D) 1000 UNITS tablet Take 2,000 Units by mouth daily.   Marland Kitchen levocetirizine (XYZAL) 5 MG tablet Take 1 tablet (5 mg total) by mouth every evening.  . pravastatin (PRAVACHOL) 10 MG tablet Take 1 tablet (10 mg total) by mouth daily.  . RABEprazole (ACIPHEX) 20  MG tablet Take 1 tablet (20 mg total) by mouth daily.  . sildenafil (REVATIO) 20 MG tablet Take 20 mg by mouth as needed.  . tadalafil (CIALIS) 20 MG tablet TAKE ONE TABLET BY MOUTH ONE TIME DAILY AS NEEDED AS DIRECTED   No facility-administered encounter medications on file as of 06/18/2019.    Activities of Daily Living In your present state of health, do you have any difficulty performing the following activities: 06/18/2019  Hearing? N  Vision? N  Difficulty concentrating or making decisions? N  Walking or climbing stairs? N  Dressing or bathing? N  Doing errands, shopping? N  Preparing Food and eating ? N  Using the Toilet? N  In the past six months, have you accidently leaked urine? N  Do you have problems with loss of bowel control? N  Managing your Medications? N  Managing your Finances? N  Housekeeping or managing your Housekeeping? N  Some recent data might be hidden    Patient Care Team: Janith Lima, MD as PCP - General (Internal Medicine) Raynelle Bring, MD as Consulting Physician (Urology)   Assessment:   This is a routine wellness examination for [place]. Physical assessment deferred to PCP.  Exercise Activities and Dietary recommendations Current Exercise Habits: Home exercise routine, Type of exercise: walking, Time (Minutes): 45, Frequency (Times/Week): 6, Weekly Exercise (Minutes/Week): 270, Intensity: Mild, Exercise limited by: None identified  Diet (meal preparation, eat out, water intake, caffeinated beverages, dairy products, fruits and vegetables): in general, a "healthy" diet  , well balanced. eats a variety of fruits and vegetables daily, limits salt, fat/cholesterol, sugar,carbohydrates,caffeine, drinks 6-8 glasses of water daily.  Goals    . Patient Stated     Maintain my current health status by continuing to exercise, eat healthy, and enjoy life.        Fall Risk Fall Risk  06/18/2019 04/28/2019 01/19/2019 11/06/2017 01/15/2016  Falls in the  past year? 0 0 0 No No  Number falls in past yr: 0 0 0 - -  Injury with Fall? 0 0 0 - -  Follow up - Falls evaluation completed Falls evaluation completed - -    Depression Screen PHQ 2/9 Scores 06/18/2019 04/28/2019 01/19/2019 11/06/2017  PHQ - 2 Score 0 0 0 0  PHQ- 9 Score - - 0 0    Cognitive Function       Ad8 score reviewed for issues:  Issues making decisions: no  Less interest in hobbies / activities: no  Repeats questions, stories (family complaining): no  Trouble using ordinary gadgets (microwave, computer, phone):no  Forgets the month or year: no  Mismanaging finances: no  Remembering appts: no  Daily problems with thinking and/or memory: no Ad8 score is= 0  Immunization History  Administered Date(s) Administered  . Fluad Quad(high Dose 65+) 03/17/2019  . Influenza Split 04/30/2011, 05/20/2012  . Influenza Whole 04/07/2009, 03/31/2010  . Influenza, High Dose Seasonal PF 04/17/2016, 04/16/2017, 04/21/2018  . Influenza,inj,Quad PF,6+ Mos 04/08/2013, 04/13/2014, 04/21/2015  . Pneumococcal Conjugate-13 01/12/2016  . Pneumococcal Polysaccharide-23 09/08/2013  . Tdap 09/13/2011  . Zoster 10/05/2008   Screening Tests Health Maintenance  Topic Date Due  . COLONOSCOPY  04/18/2021  . TETANUS/TDAP  09/12/2021  . INFLUENZA VACCINE  Completed  . Hepatitis C Screening  Completed  . PNA vac Low Risk Adult  Completed       Plan:    Reviewed health maintenance screenings with patient today and relevant education, vaccines, and/or referrals were provided.   Continue to eat heart healthy diet (full of fruits, vegetables, whole grains, lean protein, water--limit salt, fat, and sugar intake) and increase physical activity as tolerated.  I have personally reviewed and noted the following in the patient's chart:   . Medical and social history . Use of alcohol, tobacco or illicit drugs  . Current medications and supplements . Functional ability and  status . Nutritional status . Physical activity . Advanced directives . List of other physicians . Screenings to include cognitive, depression, and falls . Referrals and appointments  In addition, I have reviewed and discussed with patient certain  preventive protocols, quality metrics, and best practice recommendations. A written personalized care plan for preventive services as well as general preventive health recommendations were provided to patient.     Michiel Cowboy, RN  06/18/2019  Medical screening examination/treatment/procedure(s) were performed by non-physician practitioner and as supervising physician I was immediately available for consultation/collaboration. I agree with above. Scarlette Calico, MD

## 2019-06-18 ENCOUNTER — Ambulatory Visit (INDEPENDENT_AMBULATORY_CARE_PROVIDER_SITE_OTHER): Payer: Medicare Other | Admitting: *Deleted

## 2019-06-18 DIAGNOSIS — Z0000 Encounter for general adult medical examination without abnormal findings: Secondary | ICD-10-CM | POA: Diagnosis not present

## 2019-07-21 ENCOUNTER — Telehealth: Payer: Self-pay | Admitting: Internal Medicine

## 2019-07-21 ENCOUNTER — Other Ambulatory Visit: Payer: Self-pay | Admitting: Internal Medicine

## 2019-07-21 DIAGNOSIS — J301 Allergic rhinitis due to pollen: Secondary | ICD-10-CM

## 2019-07-21 MED ORDER — LEVOCETIRIZINE DIHYDROCHLORIDE 5 MG PO TABS: 5.0000 mg | ORAL_TABLET | Freq: Every evening | ORAL | 1 refills | Status: DC

## 2019-07-21 NOTE — Telephone Encounter (Signed)
rx refill levocetirizine (XYZAL) 5 MG tablet  PHARMACY [facility] [phone] [place], [place]  [address], [place] [address]

## 2019-07-23 ENCOUNTER — Ambulatory Visit (INDEPENDENT_AMBULATORY_CARE_PROVIDER_SITE_OTHER): Payer: Medicare Other | Admitting: Internal Medicine

## 2019-07-23 ENCOUNTER — Encounter: Payer: Self-pay | Admitting: Internal Medicine

## 2019-07-23 ENCOUNTER — Other Ambulatory Visit: Payer: Self-pay

## 2019-07-23 ENCOUNTER — Other Ambulatory Visit: Payer: Self-pay | Admitting: Internal Medicine

## 2019-07-23 ENCOUNTER — Ambulatory Visit (INDEPENDENT_AMBULATORY_CARE_PROVIDER_SITE_OTHER): Payer: Medicare Other

## 2019-07-23 VITALS — BP 138/88 | HR 60 | Temp 98.4°F | Ht 73.0 in | Wt 222.0 lb

## 2019-07-23 DIAGNOSIS — R7303 Prediabetes: Secondary | ICD-10-CM | POA: Diagnosis not present

## 2019-07-23 DIAGNOSIS — G8929 Other chronic pain: Secondary | ICD-10-CM

## 2019-07-23 DIAGNOSIS — M25551 Pain in right hip: Secondary | ICD-10-CM

## 2019-07-23 DIAGNOSIS — I10 Essential (primary) hypertension: Secondary | ICD-10-CM

## 2019-07-23 DIAGNOSIS — M7061 Trochanteric bursitis, right hip: Secondary | ICD-10-CM

## 2019-07-23 LAB — BASIC METABOLIC PANEL
BUN: 24 mg/dL — ABNORMAL HIGH (ref 6–23)
CO2: 27 mEq/L (ref 19–32)
Calcium: 9.4 mg/dL (ref 8.4–10.5)
Chloride: 107 mEq/L (ref 96–112)
Creatinine, Ser: 1.39 mg/dL (ref 0.40–1.50)
GFR: 50.38 mL/min — ABNORMAL LOW (ref >60.00)
Glucose, Bld: 102 mg/dL — ABNORMAL HIGH (ref 70–99)
Potassium: 4.5 mEq/L (ref 3.5–5.1)
Sodium: 139 mEq/L (ref 135–145)

## 2019-07-23 LAB — HEMOGLOBIN A1C: Hgb A1c MFr Bld: 6 % (ref 4.6–6.5)

## 2019-07-23 MED ORDER — MELOXICAM 7.5 MG PO TABS: 7.5000 mg | ORAL_TABLET | Freq: Every day | ORAL | 0 refills | Status: DC

## 2019-07-23 NOTE — Progress Notes (Signed)
Subjective:  Patient ID: Marcus Crawford, male    DOB: Jun 10, 1951  Age: 69 y.o. MRN: [id number]  CC: Hypertension and Osteoarthritis   This visit occurred during the SARS-CoV-2 public health emergency.  Safety protocols were in place, including screening questions prior to the visit, additional usage of staff PPE, and extensive cleaning of exam room while observing appropriate contact time as indicated for disinfecting solutions.    HPI Marcus Crawford presents for f/up - He complains of a 102-month history of right hip pain with no preceding trauma or injury.  He points to the lateral aspect of the right hip as the location for the pain.  He is not getting much symptom relief with Tylenol.  He says he notices the pain mostly when he is walking.  The pain does not radiate and he denies paresthesias.  Outpatient Medications Prior to Visit  Medication Sig Dispense Refill  . cholecalciferol (VITAMIN D) 1000 UNITS tablet Take 2,000 Units by mouth daily.     Marland Kitchen levocetirizine (XYZAL) 5 MG tablet Take 1 tablet (5 mg total) by mouth every evening. 90 tablet 1  . pravastatin (PRAVACHOL) 10 MG tablet Take 1 tablet (10 mg total) by mouth daily. 90 tablet 1  . RABEprazole (ACIPHEX) 20 MG tablet Take 1 tablet (20 mg total) by mouth daily. 90 tablet 1  . tadalafil (CIALIS) 20 MG tablet TAKE ONE TABLET BY MOUTH ONE TIME DAILY AS NEEDED AS DIRECTED    . sildenafil (REVATIO) 20 MG tablet Take 20 mg by mouth as needed.     No facility-administered medications prior to visit.    ROS Review of Systems  Constitutional: Negative for diaphoresis, fatigue and unexpected weight change.  HENT: Negative.   Eyes: Negative for visual disturbance.  Respiratory: Negative for cough, chest tightness, shortness of breath and wheezing.   Cardiovascular: Negative for chest pain, palpitations and leg swelling.  Gastrointestinal: Negative for abdominal pain, constipation, diarrhea, nausea and vomiting.  Endocrine: Negative.    Genitourinary: Negative.  Negative for difficulty urinating.  Musculoskeletal: Positive for arthralgias. Negative for myalgias and neck pain.  Skin: Negative for color change and pallor.  Neurological: Negative.  Negative for dizziness, weakness and light-headedness.  Hematological: Negative for adenopathy. Does not bruise/bleed easily.  Psychiatric/Behavioral: Negative.     Objective:  BP 138/88 (BP Location: Left Arm, Patient Position: Sitting, Cuff Size: Large)   Pulse 60   Temp 98.4 F (36.9 C) (Oral)   Ht 6\' 1"  (1.854 m)   Wt 222 lb (100.7 kg)   SpO2 99%   BMI 29.29 kg/m   BP Readings from Last 3 Encounters:  07/23/19 138/88  04/28/19 136/86  01/19/19 138/88    Wt Readings from Last 3 Encounters:  07/23/19 222 lb (100.7 kg)  04/28/19 218 lb (98.9 kg)  01/19/19 221 lb (100.2 kg)    Physical Exam Vitals reviewed.  Constitutional:      Appearance: Normal appearance.  HENT:     Nose: Nose normal.     Mouth/Throat:     Mouth: Mucous membranes are moist.  Eyes:     General: No scleral icterus.    Conjunctiva/sclera: Conjunctivae normal.  Cardiovascular:     Rate and Rhythm: Normal rate and regular rhythm.     Heart sounds: No murmur.  Pulmonary:     Effort: Pulmonary effort is normal.     Breath sounds: No stridor. No wheezing, rhonchi or rales.  Abdominal:     General: Abdomen is  flat. Bowel sounds are normal. There is no distension.     Palpations: Abdomen is soft. There is no hepatomegaly.     Tenderness: There is no abdominal tenderness.  Musculoskeletal:        General: Normal range of motion.     Cervical back: Neck supple.     Right hip: Tenderness and bony tenderness present. No deformity or crepitus. Normal range of motion.     Left hip: Normal.     Right lower leg: No edema.     Left lower leg: No edema.     Comments: ttp over the GT  Lymphadenopathy:     Cervical: No cervical adenopathy.  Skin:    General: Skin is warm and dry.    Neurological:     General: No focal deficit present.     Mental Status: He is alert.  Psychiatric:        Mood and Affect: Mood normal.        Behavior: Behavior normal.     Lab Results  Component Value Date   WBC 4.9 01/19/2019   HGB 14.5 01/19/2019   HCT 43.0 01/19/2019   PLT 173.0 01/19/2019   GLUCOSE 102 (H) 07/23/2019   CHOL 132 01/19/2019   TRIG 209.0 (H) 01/19/2019   HDL 37.10 (L) 01/19/2019   LDLDIRECT 58.0 01/19/2019   LDLCALC 53 01/15/2018   ALT 20 01/19/2019   AST 8 01/19/2019   NA 139 07/23/2019   K 4.5 07/23/2019   CL 107 07/23/2019   CREATININE 1.39 07/23/2019   BUN 24 (H) 07/23/2019   CO2 27 07/23/2019   TSH 2.49 01/19/2019   PSA 0.015 09/16/2018   INR 1.12 02/28/2012   HGBA1C 6.0 07/23/2019   MICROALBUR 0.4 10/14/2008    MR Brain Wo Contrast  Result Date: 01/19/2016 CLINICAL DATA:  69 year old hypertensive male with pain above the left ear and left jaw extending to back of head which started 3 months ago. No injury. Prostate cancer post prostatectomy (2015). Initial encounter. EXAM: MRI HEAD WITHOUT CONTRAST TECHNIQUE: Multiplanar, multiecho pulse sequences of the brain and surrounding structures were obtained without intravenous contrast. COMPARISON:  None. FINDINGS: No acute infarct or intracranial hemorrhage. Very mild chronic microvascular changes. Mild global atrophy without hydrocephalus. No intracranial mass lesion noted on this unenhanced exam. Small right vertebral artery may end in a posterior inferior cerebellar artery distribution. Left vertebral artery and basilar artery are ectatic. Slight impression left lateral medulla. No compression of the cisternal aspect of the fifth cranial nerve noted. Minimal partial opacification inferior left mastoid air cells. Mild polypoid opacification inferior left maxillary sinus. Degenerative changes C1 occipital and C1-C2 articulation. No acute orbital abnormality. IMPRESSION: No acute infarct or intracranial  hemorrhage. Very mild chronic microvascular changes. Mild global atrophy without hydrocephalus. No intracranial mass lesion noted on this unenhanced exam. Small right vertebral artery may end in a posterior inferior cerebellar artery distribution. Left vertebral artery and basilar artery are ectatic. Slight impression left lateral medulla. No compression of the cisternal aspect of the fifth cranial nerve noted. Minimal partial opacification inferior left mastoid air cells. Mild polypoid opacification inferior left maxillary sinus. Degenerative changes C1 occipital and C1-C2 articulation. Electronically Signed   By: Genia Del M.D.   On: 01/19/2016 08:35    DG HIP UNILAT WITH PELVIS 2-3 VIEWS RIGHT  Result Date: 07/23/2019 CLINICAL DATA:  Chronic pain EXAM: DG HIP (WITH OR WITHOUT PELVIS) 2-3V RIGHT COMPARISON:  None. FINDINGS: Weightbearing frontal pelvis, weightbearing AP  right hip, and lateral right hip images obtained. No fracture or dislocation. There is moderate symmetric narrowing of each hip joint. No erosive change. Sacroiliac joints bilaterally appear normal. IMPRESSION: Symmetric narrowing of each hip joint. No fracture or dislocation. No erosive change. Electronically Signed   By: Lowella Grip III M.D.   On: 07/23/2019 13:11    Assessment & Plan:   Bhuvan was seen today for hypertension and osteoarthritis.  Diagnoses and all orders for this visit:  Essential hypertension- His blood pressure is adequately well controlled.  Electrolytes and renal function are normal. -     Basic metabolic panel  Prediabetes- His A1c is at 6.0%.  Medical therapy is not indicated. -     Basic metabolic panel -     Hemoglobin A1c  Chronic right hip pain- Plain films showed symmetrical narrowing in the hip joints. I don't think this is causing his discomfort.  I am concerned he has trochanteric bursitis. -     Cancel: DG Hip Unilat W OR W/O Pelvis Min 4 Views Right; Future  Trochanteric bursitis,  right hip- Will treat this with an anti-inflammatory.  I have asked him to let me know if he wants to see sports medicine to consider additional treatment options. -     meloxicam (MOBIC) 7.5 MG tablet; Take 1 tablet (7.5 mg total) by mouth daily.   I have discontinued Jeneen Rinks T. Durnil's sildenafil. I am also having him start on meloxicam. Additionally, I am having him maintain his cholecalciferol, RABEprazole, tadalafil, pravastatin, and levocetirizine.  Meds ordered this encounter  Medications  . meloxicam (MOBIC) 7.5 MG tablet    Sig: Take 1 tablet (7.5 mg total) by mouth daily.    Dispense:  90 tablet    Refill:  0     Follow-up: Return in about 3 months (around 10/21/2019).  Scarlette Calico, MD

## 2019-07-23 NOTE — Patient Instructions (Signed)
Hip Bursitis  Hip bursitis is inflammation of a fluid-filled sac (bursa) in the hip joint. The bursa prevents the bones in the hip joint from rubbing against each other. Hip bursitis can cause mild to moderate pain, and symptoms often come and go over time. What are the causes? This condition may be caused by:  Injury to the hip.  Overuse of the muscles that surround the hip joint.  Previous injury or surgery of the hip.  Arthritis or gout.  Diabetes.  Thyroid disease.  Infection. In some cases, the cause may not be known. What are the signs or symptoms? Symptoms of this condition include:  Mild or moderate pain in the hip area. Pain may get worse with movement.  Tenderness and swelling of the hip, especially on the outer side of the hip.  In rare cases, the bursa may become infected. This may cause a fever, as well as warmth and redness in the area. Symptoms may come and go. How is this diagnosed? This condition may be diagnosed based on:  A physical exam.  Your medical history.  X-rays.  Removal of fluid from your inflamed bursa for testing (biopsy). You may be sent to a health care provider who specializes in bone diseases (orthopedist) or a provider who specializes in joint inflammation (rheumatologist). How is this treated? This condition is treated by resting, icing, applying pressure (compression), and raising (elevating) the injured area. This is called RICE treatment. In some cases, this may be enough to make your symptoms go away. Treatment may also include:  Using crutches.  Draining fluid out of the bursa to help relieve swelling.  Injecting medicine that helps to reduce inflammation (cortisone).  Additional medicines if the bursa is infected. Follow these instructions at home: Managing pain, stiffness, and swelling   If directed, put ice on the painful area. ? Put ice in a plastic bag. ? Place a towel between your skin and the bag. ? Leave the ice  on for 20 minutes, 2-3 times a day. ? Raise (elevate) your hip above the level of your heart as much as you can without pain. To do this, try putting a pillow under your hips while you lie down. Activity  Return to your normal activities as told by your health care provider. Ask your health care provider what activities are safe for you.  Rest and protect your hip as much as possible until your pain and swelling get better. General instructions  Take over-the-counter and prescription medicines only as told by your health care provider.  Wear compression wraps only as told by your health care provider.  Do not use your hip to support your body weight until your health care provider says that you can. Use crutches as told by your health care provider.  Gently massage and stretch your injured area as often as is comfortable.  Keep all follow-up visits as told by your health care provider. This is important. How is this prevented?  Exercise regularly, as told by your health care provider.  Warm up and stretch before being active.  Cool down and stretch after being active.  If an activity irritates your hip or causes pain, avoid the activity as much as possible.  Avoid sitting down for long periods at a time. Contact a health care provider if you:  Have a fever.  Develop new symptoms.  Have difficulty walking or doing everyday activities.  Have pain that gets worse or does not get better with medicine.    Develop red skin or a feeling of warmth in your hip area. Get help right away if you:  Cannot move your hip.  Have severe pain. Summary  Hip bursitis is inflammation of a fluid-filled sac (bursa) in the hip joint.  Hip bursitis can cause mild to moderate pain, and symptoms often come and go over time.  This condition is treated with rest, ice, compression, elevation, and medicines. This information is not intended to replace advice given to you by your health care  provider. Make sure you discuss any questions you have with your health care provider. Document Revised: 02/24/2018 Document Reviewed: 02/24/2018 Elsevier Patient Education  2020 [employer]vier Inc.  

## 2019-07-24 ENCOUNTER — Encounter: Payer: Self-pay | Admitting: Internal Medicine

## 2019-08-15 ENCOUNTER — Ambulatory Visit: Payer: Medicare Other | Attending: Internal Medicine

## 2019-08-15 DIAGNOSIS — Z23 Encounter for immunization: Secondary | ICD-10-CM

## 2019-08-15 NOTE — Progress Notes (Signed)
   Covid-19 Vaccination Clinic  Name:  Marcus Crawford    MRN: [id number] DOB: 1950/11/24  08/15/2019  [name] was observed post Covid-19 immunization for 30 minutes based on pre-vaccination screening without incidence. He was provided with Vaccine Information Sheet and instruction to access the V-Safe system.   [name] was instructed to call 911 with any severe reactions post vaccine: Marland Kitchen Difficulty breathing  . Swelling of your face and throat  . A fast heartbeat  . A bad rash all over your body  . Dizziness and weakness    Immunizations Administered    Name Date Dose VIS Date Route   Pfizer COVID-19 Vaccine 08/15/2019  2:30 PM 0.3 mL 06/12/2019 Intramuscular   Manufacturer: [place]   Lot: [id number]   [place]: [id number]

## 2019-09-06 ENCOUNTER — Other Ambulatory Visit: Payer: Self-pay

## 2019-09-07 ENCOUNTER — Ambulatory Visit: Payer: Medicare Other | Attending: Internal Medicine

## 2019-09-07 DIAGNOSIS — Z23 Encounter for immunization: Secondary | ICD-10-CM

## 2019-09-07 NOTE — Progress Notes (Signed)
   Covid-19 Vaccination [facility]  Name:  Marcus Crawford    MRN: [id number] DOB: May 01, 1951  09/07/2019  Marcus Crawford was observed post Covid-19 immunization for 30 minutes based on pre-vaccination screening without incident. He was provided with Vaccine Information Sheet and instruction to access the V-Safe system.   Marcus Crawford was instructed to call 911 with any severe reactions post vaccine: Marland Kitchen Difficulty breathing  . Swelling of face and throat  . A fast heartbeat  . A bad rash all over body  . Dizziness and weakness   Immunizations Administered    Name Date Dose VIS Date Route   Pfizer COVID-19 Vaccine 09/07/2019  1:18 PM 0.3 mL 06/12/2019 Intramuscular   Manufacturer: [place]   Lot: [id number]   Isabel: [id number]

## 2019-10-06 LAB — PSA: PSA: 0.015

## 2019-10-14 DIAGNOSIS — N5201 Erectile dysfunction due to arterial insufficiency: Secondary | ICD-10-CM | POA: Diagnosis not present

## 2019-10-14 DIAGNOSIS — Z8546 Personal history of malignant neoplasm of prostate: Secondary | ICD-10-CM | POA: Diagnosis not present

## 2019-10-26 ENCOUNTER — Other Ambulatory Visit: Payer: Self-pay | Admitting: Internal Medicine

## 2019-10-26 DIAGNOSIS — K2100 Gastro-esophageal reflux disease with esophagitis, without bleeding: Secondary | ICD-10-CM

## 2019-10-26 MED ORDER — RABEPRAZOLE SODIUM 20 MG PO TBEC: 20.0000 mg | DELAYED_RELEASE_TABLET | Freq: Every day | ORAL | 1 refills | Status: DC

## 2019-10-27 ENCOUNTER — Ambulatory Visit (INDEPENDENT_AMBULATORY_CARE_PROVIDER_SITE_OTHER): Payer: Medicare Other | Admitting: Internal Medicine

## 2019-10-27 ENCOUNTER — Encounter: Payer: Self-pay | Admitting: Internal Medicine

## 2019-10-27 ENCOUNTER — Other Ambulatory Visit: Payer: Self-pay

## 2019-10-27 VITALS — BP 126/80 | HR 68 | Temp 97.9°F | Ht 73.0 in | Wt 223.0 lb

## 2019-10-27 DIAGNOSIS — I10 Essential (primary) hypertension: Secondary | ICD-10-CM

## 2019-10-27 DIAGNOSIS — Z23 Encounter for immunization: Secondary | ICD-10-CM | POA: Diagnosis not present

## 2019-10-27 DIAGNOSIS — K2100 Gastro-esophageal reflux disease with esophagitis, without bleeding: Secondary | ICD-10-CM

## 2019-10-27 DIAGNOSIS — M7061 Trochanteric bursitis, right hip: Secondary | ICD-10-CM

## 2019-10-27 DIAGNOSIS — N1831 Chronic kidney disease, stage 3a: Secondary | ICD-10-CM | POA: Diagnosis not present

## 2019-10-27 MED ORDER — RABEPRAZOLE SODIUM 20 MG PO TBEC: 20.0000 mg | DELAYED_RELEASE_TABLET | Freq: Every day | ORAL | 1 refills | Status: DC

## 2019-10-27 NOTE — Patient Instructions (Signed)
Gastroesophageal Reflux Disease, Adult Gastroesophageal reflux (GER) happens when acid from the stomach flows up into the tube that connects the mouth and the stomach (esophagus). Normally, food travels down the esophagus and stays in the stomach to be digested. However, when a person has GER, food and stomach acid sometimes move back up into the esophagus. If this becomes a more serious problem, the person may be diagnosed with a disease called gastroesophageal reflux disease (GERD). GERD occurs when the reflux:  Happens often.  Causes frequent or severe symptoms.  Causes problems such as damage to the esophagus. When stomach acid comes in contact with the esophagus, the acid may cause soreness (inflammation) in the esophagus. Over time, GERD may create small holes (ulcers) in the lining of the esophagus. What are the causes? This condition is caused by a problem with the muscle between the esophagus and the stomach (lower esophageal sphincter, or LES). Normally, the LES muscle closes after food passes through the esophagus to the stomach. When the LES is weakened or abnormal, it does not close properly, and that allows food and stomach acid to go back up into the esophagus. The LES can be weakened by certain dietary substances, medicines, and medical conditions, including:  Tobacco use.  Pregnancy.  Having a hiatal hernia.  Alcohol use.  Certain foods and beverages, such as coffee, chocolate, onions, and peppermint. What increases the risk? You are more likely to develop this condition if you:  Have an increased body weight.  Have a connective tissue disorder.  Use NSAID medicines. What are the signs or symptoms? Symptoms of this condition include:  Heartburn.  Difficult or painful swallowing.  The feeling of having a lump in the throat.  Abitter taste in the mouth.  Bad breath.  Having a large amount of saliva.  Having an upset or bloated  stomach.  Belching.  Chest pain. Different conditions can cause chest pain. Make sure you see your health care provider if you experience chest pain.  Shortness of breath or wheezing.  Ongoing (chronic) cough or a night-time cough.  Wearing away of tooth enamel.  Weight loss. How is this diagnosed? Your health care provider will take a medical history and perform a physical exam. To determine if you have mild or severe GERD, your health care provider may also monitor how you respond to treatment. You may also have tests, including:  A test to examine your stomach and esophagus with a small camera (endoscopy).  A test thatmeasures the acidity level in your esophagus.  A test thatmeasures how much pressure is on your esophagus.  A barium swallow or modified barium swallow test to show the shape, size, and functioning of your esophagus. How is this treated? The goal of treatment is to help relieve your symptoms and to prevent complications. Treatment for this condition may vary depending on how severe your symptoms are. Your health care provider may recommend:  Changes to your diet.  Medicine.  Surgery. Follow these instructions at home: Eating and drinking   Follow a diet as recommended by your health care provider. This may involve avoiding foods and drinks such as: ? Coffee and tea (with or without caffeine). ? Drinks that containalcohol. ? Energy drinks and sports drinks. ? Carbonated drinks or sodas. ? Chocolate and cocoa. ? Peppermint and mint flavorings. ? Garlic and onions. ? Horseradish. ? Spicy and acidic foods, including peppers, chili powder, curry powder, vinegar, hot sauces, and barbecue sauce. ? Citrus fruit juices and citrus   fruits, such as oranges, lemons, and limes. ? Tomato-based foods, such as red sauce, chili, salsa, and pizza with red sauce. ? Fried and fatty foods, such as donuts, french fries, potato chips, and high-fat dressings. ? High-fat  meats, such as hot dogs and fatty cuts of red and white meats, such as rib eye steak, sausage, ham, and bacon. ? High-fat dairy items, such as whole milk, butter, and cream cheese.  Eat small, frequent meals instead of large meals.  Avoid drinking large amounts of liquid with your meals.  Avoid eating meals during the 2-3 hours before bedtime.  Avoid lying down right after you eat.  Do not exercise right after you eat. Lifestyle   Do not use any products that contain nicotine or tobacco, such as cigarettes, e-cigarettes, and chewing tobacco. If you need help quitting, ask your health care provider.  Try to reduce your stress by using methods such as yoga or meditation. If you need help reducing stress, ask your health care provider.  If you are overweight, reduce your weight to an amount that is healthy for you. Ask your health care provider for guidance about a safe weight loss goal. General instructions  Pay attention to any changes in your symptoms.  Take over-the-counter and prescription medicines only as told by your health care provider. Do not take aspirin, ibuprofen, or other NSAIDs unless your health care provider told you to do so.  Wear loose-fitting clothing. Do not wear anything tight around your waist that causes pressure on your abdomen.  Raise (elevate) the head of your bed about 6 inches (15 cm).  Avoid bending over if this makes your symptoms worse.  Keep all follow-up visits as told by your health care provider. This is important. Contact a health care provider if:  You have: ? New symptoms. ? Unexplained weight loss. ? Difficulty swallowing or it hurts to swallow. ? Wheezing or a persistent cough. ? A hoarse voice.  Your symptoms do not improve with treatment. Get help right away if you:  Have pain in your arms, neck, jaw, teeth, or back.  Feel sweaty, dizzy, or light-headed.  Have chest pain or shortness of breath.  Vomit and your vomit looks  like blood or coffee grounds.  Faint.  Have stool that is bloody or black.  Cannot swallow, drink, or eat. Summary  Gastroesophageal reflux happens when acid from the stomach flows up into the esophagus. GERD is a disease in which the reflux happens often, causes frequent or severe symptoms, or causes problems such as damage to the esophagus.  Treatment for this condition may vary depending on how severe your symptoms are. Your health care provider may recommend diet and lifestyle changes, medicine, or surgery.  Contact a health care provider if you have new or worsening symptoms.  Take over-the-counter and prescription medicines only as told by your health care provider. Do not take aspirin, ibuprofen, or other NSAIDs unless your health care provider told you to do so.  Keep all follow-up visits as told by your health care provider. This is important. This information is not intended to replace advice given to you by your health care provider. Make sure you discuss any questions you have with your health care provider. Document Revised: 12/25/2017 Document Reviewed: 12/25/2017 Elsevier Patient Education  2020 [employer]  

## 2019-10-27 NOTE — Progress Notes (Signed)
Subjective:  Patient ID: Marcus Crawford, male    DOB: [birth year]  Age: 69 y.o. MRN: [id number]  CC: Gastroesophageal Reflux  This visit occurred during the SARS-CoV-2 public health emergency.  Safety protocols were in place, including screening questions prior to the visit, additional usage of staff PPE, and extensive cleaning of exam room while observing appropriate contact time as indicated for disinfecting solutions.    HPI Marcus Crawford presents for f/up - His heartburn is well controlled. He denies odynophagia or dysphagia. His hip pain is much better.  Outpatient Medications Prior to Visit  Medication Sig Dispense Refill  . cholecalciferol (VITAMIN D) 1000 UNITS tablet Take 2,000 Units by mouth daily.     Marland Kitchen levocetirizine (XYZAL) 5 MG tablet Take 1 tablet (5 mg total) by mouth every evening. 90 tablet 1  . meloxicam (MOBIC) 7.5 MG tablet Take 1 tablet (7.5 mg total) by mouth daily. 90 tablet 0  . pravastatin (PRAVACHOL) 10 MG tablet Take 1 tablet (10 mg total) by mouth daily. 90 tablet 1  . tadalafil (CIALIS) 20 MG tablet TAKE ONE TABLET BY MOUTH ONE TIME DAILY AS NEEDED AS DIRECTED    . RABEprazole (ACIPHEX) 20 MG tablet Take 1 tablet (20 mg total) by mouth daily. 90 tablet 1   No facility-administered medications prior to visit.    ROS Review of Systems  Constitutional: Negative for appetite change, diaphoresis, fatigue and unexpected weight change.  HENT: Negative.   Eyes: Negative for visual disturbance.  Respiratory: Negative for cough, chest tightness, shortness of breath and wheezing.   Cardiovascular: Negative for chest pain, palpitations and leg swelling.  Gastrointestinal: Negative for abdominal pain, constipation, diarrhea, nausea and vomiting.  Genitourinary: Negative.  Negative for difficulty urinating.  Musculoskeletal: Positive for arthralgias.  Skin: Negative.  Negative for color change and rash.  Neurological: Negative.  Negative for dizziness and weakness.   Hematological: Negative.   Psychiatric/Behavioral: Negative.     Objective:  BP 126/80 (BP Location: Left Arm, Patient Position: Sitting, Cuff Size: Large)   Pulse 68   Temp 97.9 F (36.6 C) (Oral)   Ht 6\' 1"  (1.854 m)   Wt 223 lb (101.2 kg)   SpO2 99%   BMI 29.42 kg/m   BP Readings from Last 3 Encounters:  10/27/19 126/80  07/23/19 138/88  04/28/19 136/86    Wt Readings from Last 3 Encounters:  10/27/19 223 lb (101.2 kg)  07/23/19 222 lb (100.7 kg)  04/28/19 218 lb (98.9 kg)    Physical Exam Vitals reviewed.  Constitutional:      Appearance: Normal appearance.  HENT:     Nose: Nose normal.     Mouth/Throat:     Mouth: Mucous membranes are moist.  Eyes:     General: No scleral icterus.    Conjunctiva/sclera: Conjunctivae normal.  Cardiovascular:     Rate and Rhythm: Normal rate and regular rhythm.     Heart sounds: No murmur.  Pulmonary:     Effort: Pulmonary effort is normal.     Breath sounds: No stridor. No wheezing, rhonchi or rales.  Abdominal:     General: Abdomen is flat.     Palpations: There is no mass.     Tenderness: There is no abdominal tenderness. There is no guarding.  Musculoskeletal:        General: Normal range of motion.     Cervical back: Neck supple.     Right lower leg: No edema.     Left  lower leg: No edema.  Lymphadenopathy:     Cervical: No cervical adenopathy.  Skin:    General: Skin is warm and dry.     Coloration: Skin is not pale.  Neurological:     General: No focal deficit present.     Mental Status: He is alert.  Psychiatric:        Mood and Affect: Mood normal.        Behavior: Behavior normal.     Lab Results  Component Value Date   WBC 4.9 01/19/2019   HGB 14.5 01/19/2019   HCT 43.0 01/19/2019   PLT 173.0 01/19/2019   GLUCOSE 102 (H) 07/23/2019   CHOL 132 01/19/2019   TRIG 209.0 (H) 01/19/2019   HDL 37.10 (L) 01/19/2019   LDLDIRECT 58.0 01/19/2019   LDLCALC 53 01/15/2018   ALT 20 01/19/2019   AST 8  01/19/2019   NA 139 07/23/2019   K 4.5 07/23/2019   CL 107 07/23/2019   CREATININE 1.39 07/23/2019   BUN 24 (H) 07/23/2019   CO2 27 07/23/2019   TSH 2.49 01/19/2019   PSA 0.015 09/16/2018   INR 1.12 02/28/2012   HGBA1C 6.0 07/23/2019   MICROALBUR 0.4 10/14/2008    MR Brain Wo Contrast  Result Date: 01/19/2016 CLINICAL DATA:  69 year old hypertensive male with pain above the left ear and left jaw extending to back of head which started 3 months ago. No injury. Prostate cancer post prostatectomy (2015). Initial encounter. EXAM: MRI HEAD WITHOUT CONTRAST TECHNIQUE: Multiplanar, multiecho pulse sequences of the brain and surrounding structures were obtained without intravenous contrast. COMPARISON:  None. FINDINGS: No acute infarct or intracranial hemorrhage. Very mild chronic microvascular changes. Mild global atrophy without hydrocephalus. No intracranial mass lesion noted on this unenhanced exam. Small right vertebral artery may end in a posterior inferior cerebellar artery distribution. Left vertebral artery and basilar artery are ectatic. Slight impression left lateral medulla. No compression of the cisternal aspect of the fifth cranial nerve noted. Minimal partial opacification inferior left mastoid air cells. Mild polypoid opacification inferior left maxillary sinus. Degenerative changes C1 occipital and C1-C2 articulation. No acute orbital abnormality. IMPRESSION: No acute infarct or intracranial hemorrhage. Very mild chronic microvascular changes. Mild global atrophy without hydrocephalus. No intracranial mass lesion noted on this unenhanced exam. Small right vertebral artery may end in a posterior inferior cerebellar artery distribution. Left vertebral artery and basilar artery are ectatic. Slight impression left lateral medulla. No compression of the cisternal aspect of the fifth cranial nerve noted. Minimal partial opacification inferior left mastoid air cells. Mild polypoid opacification  inferior left maxillary sinus. Degenerative changes C1 occipital and C1-C2 articulation. Electronically Signed   By: Genia Del M.D.   On: 01/19/2016 08:35    Assessment & Plan:   [name] was seen today for gastroesophageal reflux.  Diagnoses and all orders for this visit:  Essential hypertension- His BP is well controlled.  Gastroesophageal reflux disease with esophagitis without hemorrhage -     RABEprazole (ACIPHEX) 20 MG tablet; Take 1 tablet (20 mg total) by mouth daily.  Stage 3a chronic kidney disease- He will take mobic very sparingly. He sees nephrology in 2 months about this.  Trochanteric bursitis, right hip  Need for pneumococcal vaccination -     Pneumococcal polysaccharide vaccine 23-valent greater than or equal to 2yo subcutaneous/IM   I am having Jeneen Rinks T. Landstrom maintain his cholecalciferol, tadalafil, pravastatin, levocetirizine, meloxicam, and RABEprazole.  Meds ordered this encounter  Medications  . RABEprazole (ACIPHEX)  20 MG tablet    Sig: Take 1 tablet (20 mg total) by mouth daily.    Dispense:  90 tablet    Refill:  1     Follow-up: Return in about 3 months (around 01/26/2020).  Scarlette Calico, MD

## 2019-10-29 ENCOUNTER — Telehealth: Payer: Self-pay | Admitting: Internal Medicine

## 2019-10-29 DIAGNOSIS — I10 Essential (primary) hypertension: Secondary | ICD-10-CM

## 2019-10-29 NOTE — Progress Notes (Signed)
  Chronic Care Management   Note  10/29/2019 Name: Marcus Crawford MRN: [id number] DOB: 23-Oct-1950  [name] is a 69 y.o. year old male who is a primary care patient of Janith Lima, MD. I reached out to [name] by phone today in response to a referral sent by [name]'s PCP, Janith Lima, MD.   [name] was given information about Chronic Care Management services today including:  1. CCM service includes personalized support from designated clinical staff supervised by his physician, including individualized plan of care and coordination with other care providers 2. 24/7 contact phone numbers for assistance for urgent and routine care needs. 3. Service will only be billed when office clinical staff spend 20 minutes or more in a month to coordinate care. 4. Only one practitioner may furnish and bill the service in a calendar month. 5. The patient may stop CCM services at any time (effective at the end of the month) by phone call to the office staff.   Patient agreed to services and verbal consent obtained.    This note is not being shared with the patient for the following reason: To respect privacy (The patient or proxy has requested that the information not be shared).  Follow up plan:   [name] [employer] Scheduler

## 2019-10-29 NOTE — Progress Notes (Signed)
°  Chronic Care Management   Outreach Note  10/29/2019 Name: Marcus Crawford MRN: [id number] DOB: 06-Dec-1950  Referred by: Janith Lima, MD Reason for referral : No chief complaint on file.   An unsuccessful telephone outreach was attempted today. The patient was referred to the pharmacist for assistance with care management and care coordination.    This note is not being shared with the patient for the following reason: To respect privacy (The patient or proxy has requested that the information not be shared).  Follow Up Plan:   [name] [employer] Scheduler

## 2019-11-25 NOTE — Addendum Note (Signed)
Addended by: Karle Barr on: 11/25/2019 02:45 PM   Modules accepted: Orders

## 2019-12-01 DIAGNOSIS — H2513 Age-related nuclear cataract, bilateral: Secondary | ICD-10-CM | POA: Diagnosis not present

## 2019-12-01 DIAGNOSIS — H04123 Dry eye syndrome of bilateral lacrimal glands: Secondary | ICD-10-CM | POA: Diagnosis not present

## 2019-12-01 DIAGNOSIS — H524 Presbyopia: Secondary | ICD-10-CM | POA: Diagnosis not present

## 2019-12-03 ENCOUNTER — Telehealth: Payer: Medicare Other

## 2019-12-03 ENCOUNTER — Other Ambulatory Visit: Payer: Self-pay | Admitting: Internal Medicine

## 2019-12-03 DIAGNOSIS — E785 Hyperlipidemia, unspecified: Secondary | ICD-10-CM

## 2020-01-26 ENCOUNTER — Ambulatory Visit (INDEPENDENT_AMBULATORY_CARE_PROVIDER_SITE_OTHER): Payer: Medicare Other | Admitting: Internal Medicine

## 2020-01-26 ENCOUNTER — Encounter: Payer: Self-pay | Admitting: Internal Medicine

## 2020-01-26 ENCOUNTER — Other Ambulatory Visit: Payer: Self-pay

## 2020-01-26 VITALS — BP 126/80 | HR 59 | Temp 98.1°F | Resp 16 | Ht 73.0 in | Wt 221.0 lb

## 2020-01-26 DIAGNOSIS — N1831 Chronic kidney disease, stage 3a: Secondary | ICD-10-CM | POA: Diagnosis not present

## 2020-01-26 DIAGNOSIS — I10 Essential (primary) hypertension: Secondary | ICD-10-CM

## 2020-01-26 DIAGNOSIS — R7303 Prediabetes: Secondary | ICD-10-CM

## 2020-01-26 DIAGNOSIS — Z0000 Encounter for general adult medical examination without abnormal findings: Secondary | ICD-10-CM

## 2020-01-26 DIAGNOSIS — E559 Vitamin D deficiency, unspecified: Secondary | ICD-10-CM

## 2020-01-26 DIAGNOSIS — E785 Hyperlipidemia, unspecified: Secondary | ICD-10-CM | POA: Diagnosis not present

## 2020-01-26 NOTE — Patient Instructions (Signed)

## 2020-01-26 NOTE — Progress Notes (Signed)
Subjective:  Patient ID: Marcus Crawford, male    DOB: Aug 17, 1950  Age: 69 y.o. MRN: [id number]  CC: Hypertension  This visit occurred during the SARS-CoV-2 public health emergency.  Safety protocols were in place, including screening questions prior to the visit, additional usage of staff PPE, and extensive cleaning of exam room while observing appropriate contact time as indicated for disinfecting solutions.    HPI Marcus Crawford presents for f/up - He walks about 2 miles a day.  He has good exercise endurance and denies any recent episodes of chest pain, shortness of breath, palpitations, edema, or fatigue.  Outpatient Medications Prior to Visit  Medication Sig Dispense Refill   cholecalciferol (VITAMIN D) 1000 UNITS tablet Take 2,000 Units by mouth daily.      levocetirizine (XYZAL) 5 MG tablet Take 1 tablet (5 mg total) by mouth every evening. 90 tablet 1   pravastatin (PRAVACHOL) 10 MG tablet TAKE 1 TABLET(10 MG) BY MOUTH DAILY 90 tablet 1   RABEprazole (ACIPHEX) 20 MG tablet Take 1 tablet (20 mg total) by mouth daily. 90 tablet 1   tadalafil (CIALIS) 20 MG tablet TAKE ONE TABLET BY MOUTH ONE TIME DAILY AS NEEDED AS DIRECTED     meloxicam (MOBIC) 7.5 MG tablet Take 1 tablet (7.5 mg total) by mouth daily. 90 tablet 0   No facility-administered medications prior to visit.    ROS Review of Systems  Constitutional: Negative for appetite change, diaphoresis, fatigue and unexpected weight change.  HENT: Negative.   Eyes: Negative for visual disturbance.  Respiratory: Negative for cough, chest tightness, shortness of breath and wheezing.   Cardiovascular: Negative for chest pain, palpitations and leg swelling.  Gastrointestinal: Negative for abdominal pain, constipation, diarrhea, nausea and vomiting.  Endocrine: Negative.   Genitourinary: Negative.  Negative for difficulty urinating.  Musculoskeletal: Negative.  Negative for arthralgias and myalgias.  Skin: Negative.   Negative for color change and pallor.  Neurological: Negative.  Negative for dizziness, weakness, light-headedness and headaches.  Hematological: Negative for adenopathy. Does not bruise/bleed easily.  Psychiatric/Behavioral: Negative.     Objective:  BP 126/80 (BP Location: Left Arm, Patient Position: Sitting, Cuff Size: Large)    Pulse 59    Temp 98.1 F (36.7 C) (Oral)    Resp 16    Ht 6\' 1"  (1.854 m)    Wt (!) 221 lb (100.2 kg)    SpO2 96%    BMI 29.16 kg/m   BP Readings from Last 3 Encounters:  01/26/20 126/80  10/27/19 126/80  07/23/19 138/88    Wt Readings from Last 3 Encounters:  01/26/20 (!) 221 lb (100.2 kg)  10/27/19 223 lb (101.2 kg)  07/23/19 222 lb (100.7 kg)    Physical Exam Vitals reviewed.  Constitutional:      Appearance: Normal appearance.  HENT:     Nose: Nose normal.     Mouth/Throat:     Mouth: Mucous membranes are moist.  Eyes:     General: No scleral icterus.    Conjunctiva/sclera: Conjunctivae normal.  Cardiovascular:     Rate and Rhythm: Normal rate and regular rhythm.     Heart sounds: No murmur heard.   Pulmonary:     Effort: Pulmonary effort is normal.     Breath sounds: No stridor. No wheezing, rhonchi or rales.  Abdominal:     General: Abdomen is flat. Bowel sounds are normal. There is no distension.     Palpations: Abdomen is soft. There is no hepatomegaly,  splenomegaly or mass.     Tenderness: There is no abdominal tenderness.  Musculoskeletal:        General: Normal range of motion.     Cervical back: Neck supple.     Right lower leg: No edema.     Left lower leg: No edema.  Lymphadenopathy:     Cervical: No cervical adenopathy.  Skin:    General: Skin is warm and dry.  Neurological:     General: No focal deficit present.     Mental Status: He is alert.  Psychiatric:        Mood and Affect: Mood normal.        Behavior: Behavior normal.     Lab Results  Component Value Date   WBC 5.0 01/26/2020   HGB 15.0 01/26/2020     HCT 45.1 01/26/2020   PLT 170 01/26/2020   GLUCOSE 114 (H) 01/26/2020   CHOL 186 01/26/2020   TRIG 196 (H) 01/26/2020   HDL 39 (L) 01/26/2020   LDLDIRECT 58.0 01/19/2019   LDLCALC 115 (H) 01/26/2020   ALT 17 01/26/2020   AST 9 (L) 01/26/2020   NA 139 01/26/2020   K 4.7 01/26/2020   CL 106 01/26/2020   CREATININE 1.46 (H) 01/26/2020   BUN 26 (H) 01/26/2020   CO2 26 01/26/2020   TSH 2.87 01/26/2020   PSA 0.015 10/06/2019   INR 1.12 02/28/2012   HGBA1C 5.7 (H) 01/26/2020   MICROALBUR 0.4 10/14/2008    MR Brain Wo Contrast  Result Date: 01/19/2016 CLINICAL DATA:  69 year old hypertensive male with pain above the left ear and left jaw extending to back of head which started 3 months ago. No injury. Prostate cancer post prostatectomy (2015). Initial encounter. EXAM: MRI HEAD WITHOUT CONTRAST TECHNIQUE: Multiplanar, multiecho pulse sequences of the brain and surrounding structures were obtained without intravenous contrast. COMPARISON:  None. FINDINGS: No acute infarct or intracranial hemorrhage. Very mild chronic microvascular changes. Mild global atrophy without hydrocephalus. No intracranial mass lesion noted on this unenhanced exam. Small right vertebral artery may end in a posterior inferior cerebellar artery distribution. Left vertebral artery and basilar artery are ectatic. Slight impression left lateral medulla. No compression of the cisternal aspect of the fifth cranial nerve noted. Minimal partial opacification inferior left mastoid air cells. Mild polypoid opacification inferior left maxillary sinus. Degenerative changes C1 occipital and C1-C2 articulation. No acute orbital abnormality. IMPRESSION: No acute infarct or intracranial hemorrhage. Very mild chronic microvascular changes. Mild global atrophy without hydrocephalus. No intracranial mass lesion noted on this unenhanced exam. Small right vertebral artery may end in a posterior inferior cerebellar artery distribution. Left  vertebral artery and basilar artery are ectatic. Slight impression left lateral medulla. No compression of the cisternal aspect of the fifth cranial nerve noted. Minimal partial opacification inferior left mastoid air cells. Mild polypoid opacification inferior left maxillary sinus. Degenerative changes C1 occipital and C1-C2 articulation. Electronically Signed   By: Genia Del M.D.   On: 01/19/2016 08:35    Assessment & Plan:   [place] was seen today for hypertension.  Diagnoses and all orders for this visit:  Essential hypertension- His blood pressure is adequately well controlled.  Electrolytes are normal and renal function is stable. -     CBC with Differential/Platelet; Future -     TSH; Future -     Urinalysis, Routine w reflex microscopic; Future -     Urinalysis, Routine w reflex microscopic -     TSH -  CBC with Differential/Platelet  Stage 3a chronic kidney disease- His renal function is stable.  His blood pressure is well controlled.  He agrees to avoid nephrotoxic agents. -     BASIC METABOLIC PANEL WITH GFR; Future -     Urinalysis, Routine w reflex microscopic; Future -     Urinalysis, Routine w reflex microscopic -     BASIC METABOLIC PANEL WITH GFR  Hyperlipidemia with target LDL less than 130- He has achieved his LDL goal and is doing well on the statin. -     Lipid panel; Future -     Hepatic function panel; Future -     TSH; Future -     TSH -     Hepatic function panel -     Lipid panel  Vitamin D deficiency -     VITAMIN D 25 Hydroxy (Vit-D Deficiency, Fractures); Future -     VITAMIN D 25 Hydroxy (Vit-D Deficiency, Fractures)  Routine general medical examination at a health care facility  Prediabetes- His blood sugar is well controlled. -     BASIC METABOLIC PANEL WITH GFR; Future -     Hemoglobin A1c; Future -     Hemoglobin A1c -     BASIC METABOLIC PANEL WITH GFR   I have discontinued Jeneen Rinks T. Schlachter's meloxicam. I am also having him maintain  his cholecalciferol, tadalafil, levocetirizine, RABEprazole, and pravastatin.  No orders of the defined types were placed in this encounter.    Follow-up: Return in about 6 months (around 07/28/2020).  Scarlette Calico, MD

## 2020-01-27 LAB — HEMOGLOBIN A1C
Hgb A1c MFr Bld: 5.7 % of total Hgb — ABNORMAL HIGH (ref <5.7)
Mean Plasma Glucose: 117 (calc)
eAG (mmol/L): 6.5 (calc)

## 2020-01-27 LAB — HEPATIC FUNCTION PANEL
AG Ratio: 2.4 (calc) (ref 1.0–2.5)
ALT: 17 U/L (ref 9–46)
AST: 9 U/L — ABNORMAL LOW (ref 10–35)
Albumin: 4.3 g/dL (ref 3.6–5.1)
Alkaline phosphatase (APISO): 67 U/L (ref 35–144)
Bilirubin, Direct: 0.1 mg/dL (ref 0.0–0.2)
Globulin: 1.8 g/dL (calc) — ABNORMAL LOW (ref 1.9–3.7)
Indirect Bilirubin: 0.6 mg/dL (calc) (ref 0.2–1.2)
Total Bilirubin: 0.7 mg/dL (ref 0.2–1.2)
Total Protein: 6.1 g/dL (ref 6.1–8.1)

## 2020-01-27 LAB — CBC WITH DIFFERENTIAL/PLATELET
Absolute Monocytes: 465 cells/uL (ref 200–950)
Basophils Absolute: 60 cells/uL (ref 0–200)
Basophils Relative: 1.2 %
Eosinophils Absolute: 140 cells/uL (ref 15–500)
Eosinophils Relative: 2.8 %
HCT: 45.1 % (ref 38.5–50.0)
Hemoglobin: 15 g/dL (ref 13.2–17.1)
Lymphs Abs: 1405 cells/uL (ref 850–3900)
MCH: 29.9 pg (ref 27.0–33.0)
MCHC: 33.3 g/dL (ref 32.0–36.0)
MCV: 90 fL (ref 80.0–100.0)
MPV: 9.6 fL (ref 7.5–12.5)
Monocytes Relative: 9.3 %
Neutro Abs: 2930 cells/uL (ref 1500–7800)
Neutrophils Relative %: 58.6 %
Platelets: 170 10*3/uL (ref 140–400)
RBC: 5.01 10*6/uL (ref 4.20–5.80)
RDW: 12.9 % (ref 11.0–15.0)
Total Lymphocyte: 28.1 %
WBC: 5 10*3/uL (ref 3.8–10.8)

## 2020-01-27 LAB — BASIC METABOLIC PANEL WITH GFR
BUN/Creatinine Ratio: 18 (calc) (ref 6–22)
BUN: 26 mg/dL — ABNORMAL HIGH (ref 7–25)
CO2: 26 mmol/L (ref 20–32)
Calcium: 9.4 mg/dL (ref 8.6–10.3)
Chloride: 106 mmol/L (ref 98–110)
Creat: 1.46 mg/dL — ABNORMAL HIGH (ref 0.70–1.18)
GFR, Est African American: 55 mL/min/{1.73_m2} — ABNORMAL LOW (ref >=60)
GFR, Est Non African American: 48 mL/min/{1.73_m2} — ABNORMAL LOW (ref >=60)
Glucose, Bld: 114 mg/dL — ABNORMAL HIGH (ref 65–99)
Potassium: 4.7 mmol/L (ref 3.5–5.3)
Sodium: 139 mmol/L (ref 135–146)

## 2020-01-27 LAB — URINALYSIS, ROUTINE W REFLEX MICROSCOPIC
Bacteria, UA: NONE SEEN /HPF
Bilirubin Urine: NEGATIVE
Glucose, UA: NEGATIVE
Hgb urine dipstick: NEGATIVE
Hyaline Cast: NONE SEEN /LPF
Ketones, ur: NEGATIVE
Leukocytes,Ua: NEGATIVE
Nitrite: NEGATIVE
Protein, ur: NEGATIVE
RBC / HPF: NONE SEEN /HPF (ref 0–2)
Specific Gravity, Urine: 1.023 (ref 1.001–1.03)
pH: 6 (ref 5.0–8.0)

## 2020-01-27 LAB — LIPID PANEL
Cholesterol: 186 mg/dL (ref <200)
HDL: 39 mg/dL — ABNORMAL LOW (ref >=40)
LDL Cholesterol (Calc): 115 mg/dL (calc) — ABNORMAL HIGH
Non-HDL Cholesterol (Calc): 147 mg/dL (calc) — ABNORMAL HIGH (ref <130)
Total CHOL/HDL Ratio: 4.8 (calc) (ref <5.0)
Triglycerides: 196 mg/dL — ABNORMAL HIGH (ref <150)

## 2020-01-27 LAB — VITAMIN D 25 HYDROXY (VIT D DEFICIENCY, FRACTURES): Vit D, 25-Hydroxy: 47 ng/mL (ref 30–100)

## 2020-01-27 LAB — TSH: TSH: 2.87 mIU/L (ref 0.40–4.50)

## 2020-02-05 NOTE — Chronic Care Management (AMB) (Signed)
Chronic Care Management Pharmacy  Name: Marcus Crawford  MRN: [id number] DOB: 22-Jun-1951   Chief Complaint/ HPI  Marcus Crawford,  69 y.o. , male presents for their Initial CCM visit with the clinical pharmacist via telephone due to COVID-19 Pandemic.  PCP : Janith Lima, MD Patient Care Team: Janith Lima, MD as PCP - General (Internal Medicine) Raynelle Bring, MD as Consulting Physician (Urology) Charlton Haws, [facility] as Pharmacist (Pharmacist)  Their chronic conditions include: Hypertension, Hyperlipidemia, GERD, Chronic Kidney Disease, Allergic Rhinitis and Prediabtes, Hx prostate cancer, hip pain   From [facility], moved to [place] age 60. Been seeing [facility] since the 70s. Lives with wife, 23 years congratulations, retired Barrister's clerk. Walks 2 miles with wife every morning, takes care of his cars. [employer] since he was 69 yrs old.   Office Visits: 01/26/20 Dr Ronnald Ramp OV: conditions stable, meloxicam dc'd due to completed course.  10/27/19 Dr Ronnald Ramp OV: conditions stable, no med changes.  Consult Visit: 10/14/19 Dr Alinda Money (urology): f/u for prostate cancer, ED.  Allergies  Allergen Reactions  . Penicillins     Rash (RN clarified with pt) No SOB/swelling  . Sulfa Antibiotics Rash  . Atorvastatin Other (See Comments)    Hip pain    Medications: Outpatient Encounter Medications as of 02/08/2020  Medication Sig  . cholecalciferol (VITAMIN D) 1000 UNITS tablet Take 1,000 Units by mouth daily.   Marland Kitchen levocetirizine (XYZAL) 5 MG tablet Take 1 tablet (5 mg total) by mouth every evening.  . pravastatin (PRAVACHOL) 10 MG tablet TAKE 1 TABLET(10 MG) BY MOUTH DAILY  . RABEprazole (ACIPHEX) 20 MG tablet Take 1 tablet (20 mg total) by mouth daily.  . tadalafil (CIALIS) 20 MG tablet TAKE ONE TABLET BY MOUTH ONE TIME DAILY AS NEEDED AS DIRECTED (Patient not taking: Reported on 02/08/2020)   No facility-administered encounter medications on file as of 02/08/2020.     Current  Diagnosis/Assessment:  SDOH Interventions     Most Recent Value  SDOH Interventions  Financial Strain Interventions Intervention Not Indicated      Goals Addressed            This Visit's Progress   . Pharmacy Care Plan       CARE PLAN ENTRY (see longitudinal plan of care for additional care plan information)  Current Barriers:  . Chronic Disease Management support, education, and care coordination needs related to Hypertension, Hyperlipidemia, and GERD   Hypertension BP Readings from Last 3 Encounters:  01/26/20 126/80  10/27/19 126/80  07/23/19 138/88 .  Pharmacist Clinical Goal(s): o Over the next 180 days, patient will work with PharmD and providers to maintain BP goal <130/80 . Current regimen:  o No medications . Interventions: o Discussed BP goals and benefits of exercise and diet for prevention of heart attack / stroke . Patient self care activities - Over the next 180 days, patient will: o Check BP as needed, document, and provide at future appointments o Ensure daily salt intake < 2300 mg/day  Hyperlipidemia Lab Results  Component Value Date/Time   LDLCALC 115 (H) 01/26/2020 08:48 AM   LDLDIRECT 58.0 01/19/2019 09:12 AM .  Pharmacist Clinical Goal(s): o Over the next 180 days, patient will work with PharmD and providers to achieve LDL goal < 100 . Current regimen:  o Pravastatin 10 mg daily . Interventions: o Discussed cholesterol goals and benefits of medication for prevention of heart attack / stroke . Patient self care activities - Over  the next 180 days, patient will: o Continue medication as prescribed o Continue daily exercise routine  GERD . Pharmacist Clinical Goal(s) o Over the next 180 days, patient will work with PharmD and providers to optimize therapy . Current regimen:  o Rabeprazole 20 mg every other day . Interventions: o Discussed long term issues with PPI and potential to use medication as needed to limit exposure . Patient self  care activities - Over the next 180 days, patient will: o Consider using rabeprazole on as-needed basis only  Medication management . Pharmacist Clinical Goal(s): o Over the next 180 days, patient will work with PharmD and providers to maintain optimal medication adherence . Current pharmacy: [facility] . Interventions o Comprehensive medication review performed. o Continue current medication management strategy . Patient self care activities - Over the next 180 days, patient will: o Focus on medication adherence by pill box o Take medications as prescribed o Report any questions or concerns to PharmD and/or provider(s)  Initial goal documentation       Hypertension   BP goal is:  <130/80  Office blood pressures are  BP Readings from Last 3 Encounters:  01/26/20 126/80  10/27/19 126/80  07/23/19 138/88   Patient checks BP at home infrequently Patient home BP readings are ranging:   Patient has failed these meds in the past: benazepril, ramipril, diltiazem, spironolactone Patient is currently controlled on the following medications:  . 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 and has not needed BP medication since then; discussed BP goals and importance of maintaining exercise routine  Plan  Continue control with diet and exercise     Hyperlipidemia   LDL goal < 130  Lipid Panel     Component Value Date/Time   CHOL 186 01/26/2020 0848   TRIG 196 (H) 01/26/2020 0848   HDL 39 (L) 01/26/2020 0848   LDLCALC 115 (H) 01/26/2020 0848   LDLDIRECT 58.0 01/19/2019 0912    Hepatic Function Latest Ref Rng & Units 01/26/2020 01/19/2019 01/15/2018  Total Protein 6.1 - 8.1 g/dL 6.1 6.4 6.5  Albumin 3.5 - 5.2 g/dL - 4.4 4.2  AST 10 - 35 U/L 9(L) 8 10  ALT 9 - 46 U/L '17 20 28  ' Alk Phosphatase 39 - 117 U/L - 77 67  Total Bilirubin 0.2 - 1.2 mg/dL 0.7 0.6 0.5  Bilirubin, Direct 0.0  - 0.2 mg/dL 0.1 0.1 -     The 10-year ASCVD risk score Mikey Bussing DC Jr., et al., 2013) is: 20.1%   Values used to calculate the score:     Age: 70 years     Sex: Male     Is Non-Hispanic African American: No     Diabetic: No     Tobacco smoker: No     Systolic Blood Pressure: 916 mmHg     Is BP treated: No     HDL Cholesterol: 39 mg/dL     Total Cholesterol: 186 mg/dL   Patient has failed these meds in past: atorvastatin 40 mg Patient is currently controlled on the following medications:  . Pravastatin 10 mg daily  We discussed:  diet and exercise extensively; pt reports atorvastatin was stopped due to hip pain, but pt was later told hip pain was due to bursitis and not the statin; cholesterol is relatively controlled on current medication so pt can continue; if cholesterol increases in future a higher dose  of pravastatin may suffice to achieve goal.  Plan  Continue current medications and control with diet and exercise  GERD   Patient has failed these meds in past: n/a  Patient is currently controlled on the following medications:  . Rabeprazole 20 mg 1 tablet - every other day  We discussed: pt started taking PPI every other day after reading about risks of long term PPI; discussed potential to reduce exposure even further by using on PRN basis; pt reports infrequent heartburn currently, he denies needing additional medication to treat breakthrough symptoms.   Plan  Continue current medications  Allergic rhinitis   Patient has failed these meds in past: n/a Patient is currently controlled on the following medications:  . Levocetirizine 5 mg qPM - as needed  We discussed:  Pt reports allergies are seasonal, he only takes medication 2-3 times per year typically  Plan  Continue current medications  Vitamin D deficiency   Lab Results  Component Value Date/Time   VD25OH 47 01/26/2020 08:48 AM   VD25OH 51.22 01/19/2019 09:12 AM   Patient has failed these meds in past:  n/a Patient is currently controlled on the following medications:  Marland Kitchen Vitamin D 1000 IU - 1 tablets daily  We discussed:  Vitamin D levels were within normal range  Plan  Continue current medications  Erectile dysfunction   Patient has failed these meds in past: n/a Patient is currently controlled on the following medications:  . Tadalafil 20 mg PRN  We discussed:  Pt does not endorse issues or side effects  Plan  Continue current medications  Medication Management   Pt uses [facility] for all medications Uses pill box? Yes Pt endorses 100% compliance  We discussed: Pt denies issues with current phamacy; he is using [facility] for rabeprazole with [facility] since it is cheapest that way.  Plan  Continue current medication management strategy    Follow up: 6 month phone visit  Charlene Brooke, PharmD, [facility] Clinical Pharmacist [facility] at [facility] [phone]

## 2020-02-08 ENCOUNTER — Other Ambulatory Visit: Payer: Self-pay

## 2020-02-08 ENCOUNTER — Ambulatory Visit: Payer: Medicare Other | Admitting: Pharmacist

## 2020-02-08 DIAGNOSIS — I10 Essential (primary) hypertension: Secondary | ICD-10-CM

## 2020-02-08 DIAGNOSIS — E785 Hyperlipidemia, unspecified: Secondary | ICD-10-CM

## 2020-02-08 DIAGNOSIS — K2100 Gastro-esophageal reflux disease with esophagitis, without bleeding: Secondary | ICD-10-CM

## 2020-02-08 NOTE — Patient Instructions (Addendum)
Visit Information  Phone number for Pharmacist: [phone]  Thank you for meeting with me to discuss your medications! I look forward to working with you to achieve your health care goals. Below is a summary of what we talked about during the visit:  Goals Addressed            This Visit's Progress   . Pharmacy Care Plan       CARE PLAN ENTRY (see longitudinal plan of care for additional care plan information)  Current Barriers:  . Chronic Disease Management support, education, and care coordination needs related to Hypertension, Hyperlipidemia, and GERD   Hypertension BP Readings from Last 3 Encounters:  01/26/20 126/80  10/27/19 126/80  07/23/19 138/88 .  Pharmacist Clinical Goal(s): o Over the next 180 days, patient will work with PharmD and providers to maintain BP goal <130/80 . Current regimen:  o No medications . Interventions: o Discussed BP goals and benefits of exercise and diet for prevention of heart attack / stroke . Patient self care activities - Over the next 180 days, patient will: o Check BP as needed, document, and provide at future appointments o Ensure daily salt intake < 2300 mg/day  Hyperlipidemia Lab Results  Component Value Date/Time   LDLCALC 115 (H) 01/26/2020 08:48 AM   LDLDIRECT 58.0 01/19/2019 09:12 AM .  Pharmacist Clinical Goal(s): o Over the next 180 days, patient will work with PharmD and providers to achieve LDL goal < 100 . Current regimen:  o Pravastatin 10 mg daily . Interventions: o Discussed cholesterol goals and benefits of medication for prevention of heart attack / stroke . Patient self care activities - Over the next 180 days, patient will: o Continue medication as prescribed o Continue daily exercise routine  GERD . Pharmacist Clinical Goal(s) o Over the next 180 days, patient will work with PharmD and providers to optimize therapy . Current regimen:  o Rabeprazole 20 mg every other day . Interventions: o Discussed  long term issues with PPI and potential to use medication as needed to limit exposure . Patient self care activities - Over the next 180 days, patient will: o Consider using rabeprazole on as-needed basis only  Medication management . Pharmacist Clinical Goal(s): o Over the next 180 days, patient will work with PharmD and providers to maintain optimal medication adherence . Current pharmacy: [facility] . Interventions o Comprehensive medication review performed. o Continue current medication management strategy . Patient self care activities - Over the next 180 days, patient will: o Focus on medication adherence by pill box o Take medications as prescribed o Report any questions or concerns to PharmD and/or provider(s)  Initial goal documentation       Marcus Crawford was given information about Chronic Care Management services today including:  1. CCM service includes personalized support from designated clinical staff supervised by his physician, including individualized plan of care and coordination with other care providers 2. 24/7 contact phone numbers for assistance for urgent and routine care needs. 3. Standard insurance, coinsurance, copays and deductibles apply for chronic care management only during months in which we provide at least 20 minutes of these services. Most insurances cover these services at 100%, however patients may be responsible for any copay, coinsurance and/or deductible if applicable. This service may help you avoid the need for more expensive face-to-face services. 4. Only one practitioner may furnish and bill the service in a calendar month. 5. The patient may stop CCM services at any time (effective at the  end of the month) by phone call to the office staff.  Patient agreed to services and verbal consent obtained.   Patient verbalizes understanding of instructions provided today.  Telephone follow up appointment with pharmacy team member scheduled for: 6  months  Charlene Brooke, PharmD Clinical Pharmacist [facility] at [facility] [phone]  Cholesterol Content in Foods Cholesterol is a waxy, fat-like substance that helps to carry fat in the blood. The body needs cholesterol in small amounts, but too much cholesterol can cause damage to the arteries and heart. Most people should eat less than 200 milligrams (mg) of cholesterol a day. Foods with cholesterol  Cholesterol is found in animal-based foods, such as meat, seafood, and dairy. Generally, low-fat dairy and lean meats have less cholesterol than full-fat dairy and fatty meats. The milligrams of cholesterol per serving (mg per serving) of common cholesterol-containing foods are listed below. Meat and other proteins  Egg -- one large whole egg has 186 mg.  Veal shank -- 4 oz has 141 mg.  Lean ground [place] (93% lean) -- 4 oz has 118 mg.  Fat-trimmed lamb loin -- 4 oz has 106 mg.  Lean ground beef (90% lean) -- 4 oz has 100 mg.  Lobster -- 3.5 oz has 90 mg.  Pork loin chops -- 4 oz has 86 mg.  Canned salmon -- 3.5 oz has 83 mg.  Fat-trimmed beef top loin -- 4 oz has 78 mg.  Frankfurter -- 1 frank (3.5 oz) has 77 mg.  Crab -- 3.5 oz has 71 mg.  Roasted chicken without skin, white meat -- 4 oz has 66 mg.  Light bologna -- 2 oz has 45 mg.  Deli-cut [place] -- 2 oz has 31 mg.  Canned tuna -- 3.5 oz has 31 mg.  Berniece Salines -- 1 oz has 29 mg.  Oysters and mussels (raw) -- 3.5 oz has 25 mg.  Mackerel -- 1 oz has 22 mg.  Trout -- 1 oz has 20 mg.  Pork sausage -- 1 link (1 oz) has 17 mg.  Salmon -- 1 oz has 16 mg.  Tilapia -- 1 oz has 14 mg. Dairy  Soft-serve ice cream --  cup (4 oz) has 103 mg.  Whole-milk yogurt -- 1 cup (8 oz) has 29 mg.  Cheddar cheese -- 1 oz has 28 mg.  American cheese -- 1 oz has 28 mg.  Whole milk -- 1 cup (8 oz) has 23 mg.  2% milk -- 1 cup (8 oz) has 18 mg.  Cream cheese -- 1 tablespoon (Tbsp) has 15 mg.  Cottage  cheese --  cup (4 oz) has 14 mg.  Low-fat (1%) milk -- 1 cup (8 oz) has 10 mg.  Sour cream -- 1 Tbsp has 8.5 mg.  Low-fat yogurt -- 1 cup (8 oz) has 8 mg.  Nonfat Greek yogurt -- 1 cup (8 oz) has 7 mg.  Half-and-half cream -- 1 Tbsp has 5 mg. Fats and oils  Cod liver oil -- 1 tablespoon (Tbsp) has 82 mg.  Butter -- 1 Tbsp has 15 mg.  Lard -- 1 Tbsp has 14 mg.  Bacon grease -- 1 Tbsp has 14 mg.  Mayonnaise -- 1 Tbsp has 5-10 mg.  Margarine -- 1 Tbsp has 3-10 mg. Exact amounts of cholesterol in these foods may vary depending on specific ingredients and brands. Foods without cholesterol Most plant-based foods do not have cholesterol unless you combine them with a food that has cholesterol. Foods without  cholesterol include:  Grains and cereals.  Vegetables.  Fruits.  Vegetable oils, such as olive, canola, and sunflower oil.  Legumes, such as peas, beans, and lentils.  Nuts and seeds.  Egg whites. Summary  The body needs cholesterol in small amounts, but too much cholesterol can cause damage to the arteries and heart.  Most people should eat less than 200 milligrams (mg) of cholesterol a day. This information is not intended to replace advice given to you by your health care provider. Make sure you discuss any questions you have with your health care provider. Document Revised: 05/31/2017 Document Reviewed: 02/12/2017 Elsevier Patient Education  [place].

## 2020-03-24 IMAGING — DX DG HIP (WITH OR WITHOUT PELVIS) 2-3V*R*
3 series · 3 of 3 positions shown · non-contrast
Comparison: None.

CLINICAL DATA: Chronic pain

EXAM:
DG HIP (WITH OR WITHOUT PELVIS) 2-3V RIGHT

[pelvis ap]
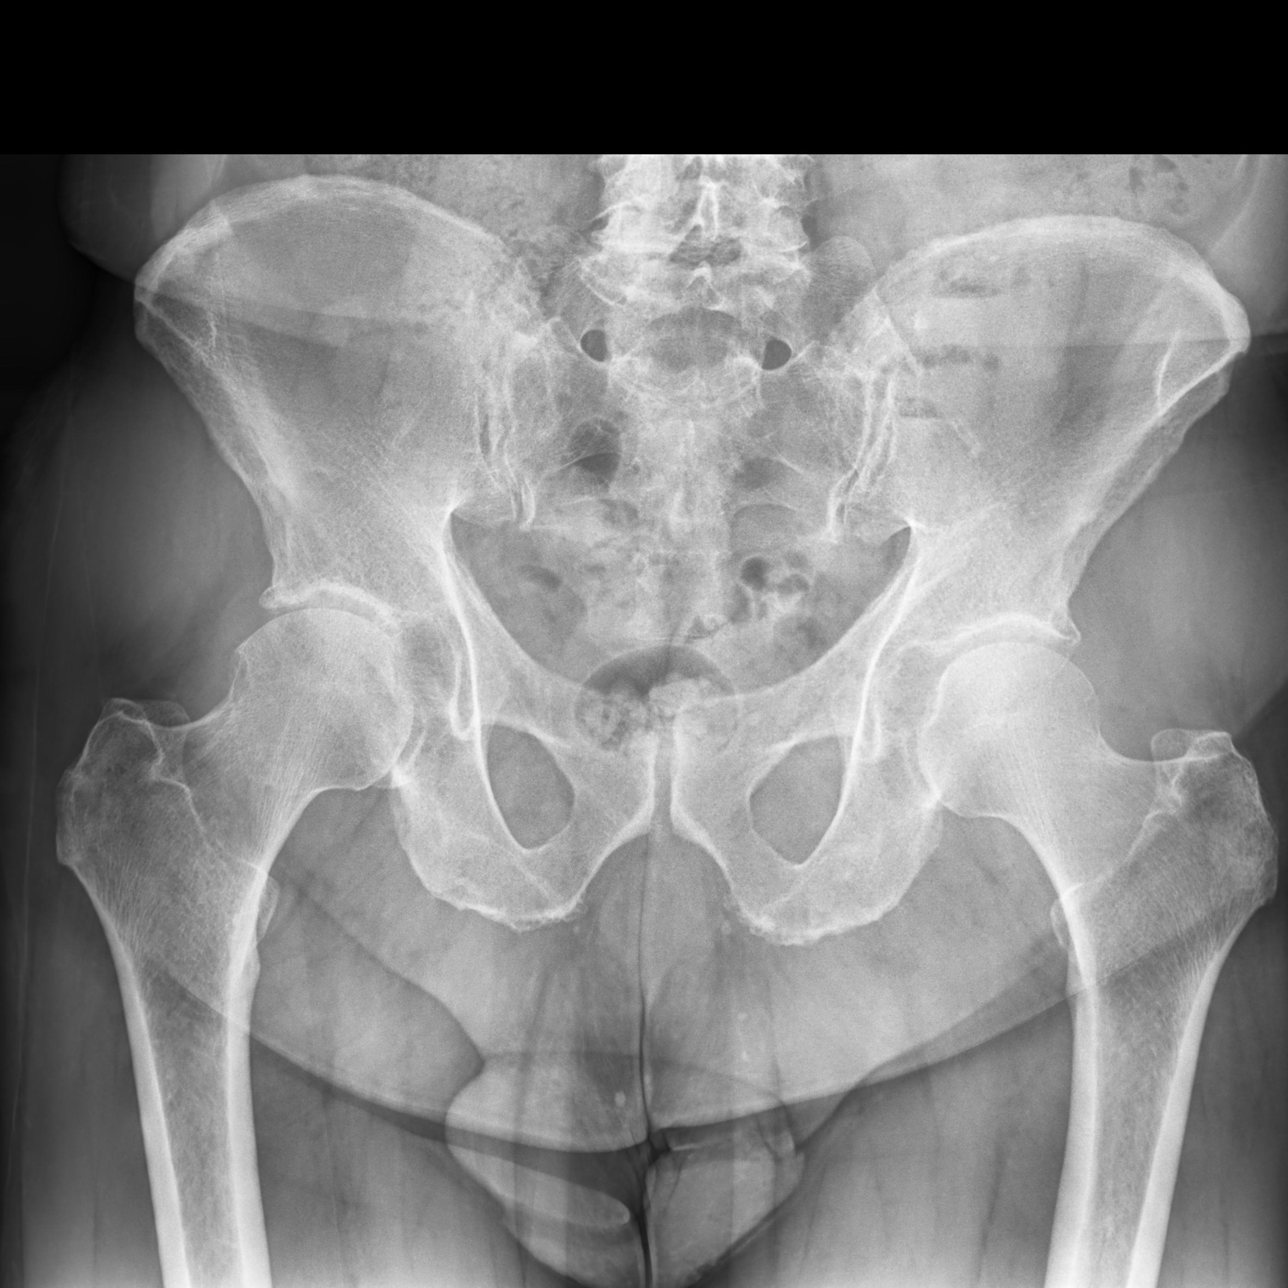

[hip ap]
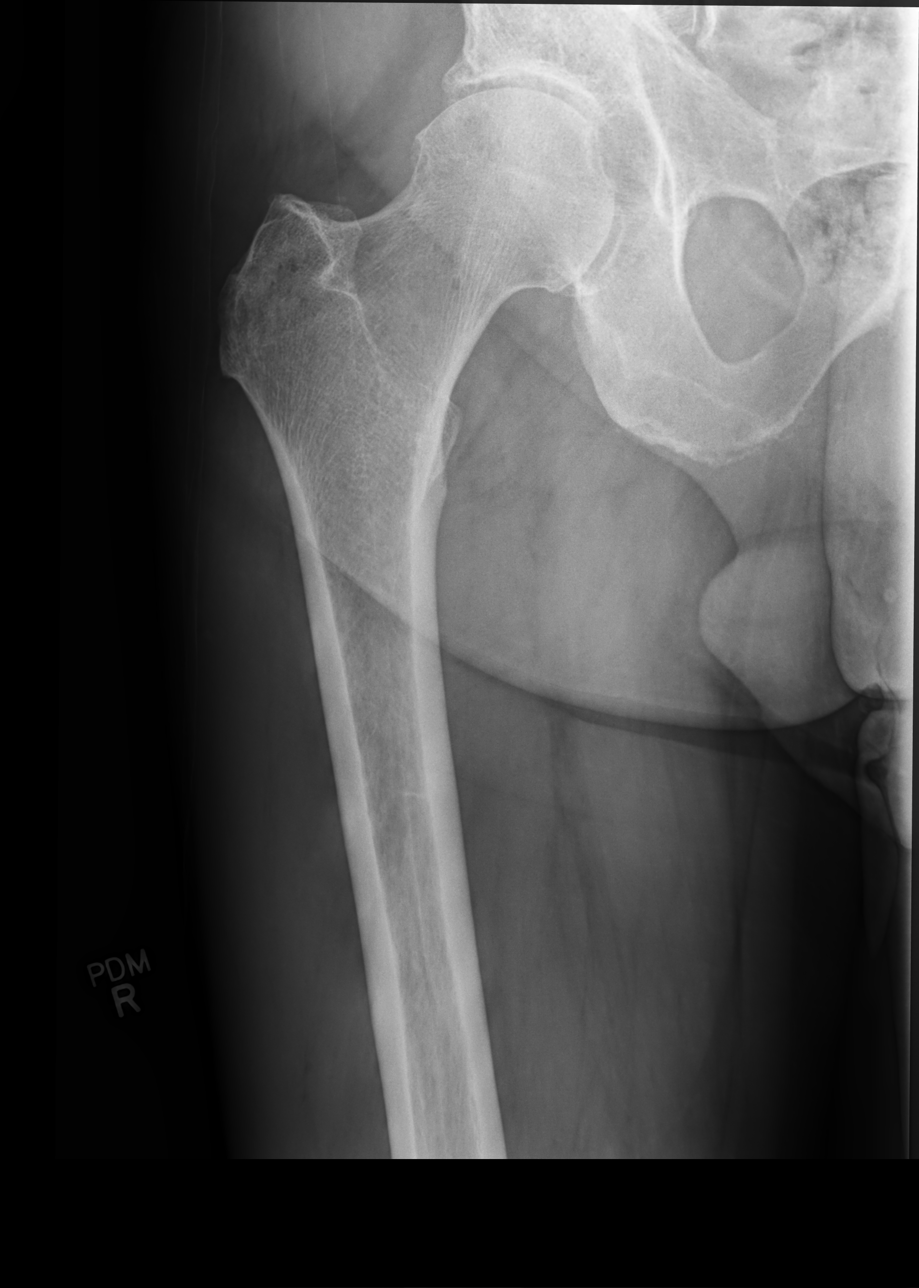

[hip (frog leg)]
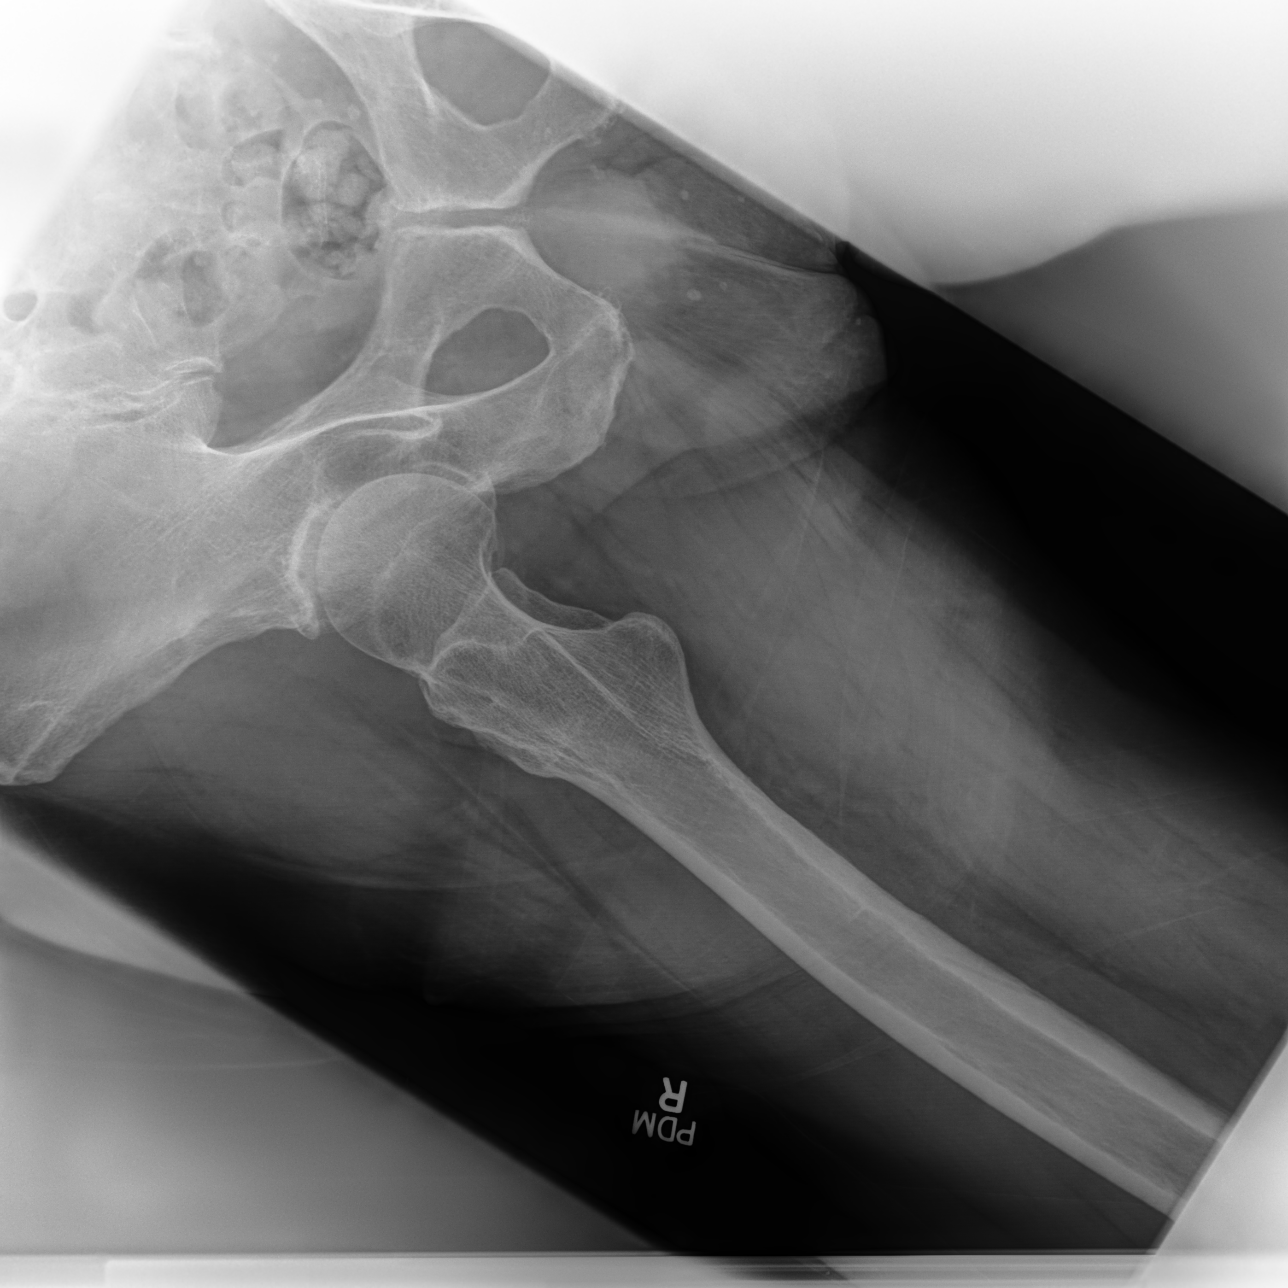

[3 of 3 positions shown; findings below may reference images not displayed]

FINDINGS: Weightbearing frontal pelvis, weightbearing AP right hip, and
lateral right hip images obtained. No fracture or dislocation. There
is moderate symmetric narrowing of each hip joint. No erosive
change. Sacroiliac joints bilaterally appear normal.
IMPRESSION: Symmetric narrowing of each hip joint. No fracture or dislocation.
No erosive change.

## 2020-04-12 ENCOUNTER — Other Ambulatory Visit: Payer: Self-pay

## 2020-04-12 ENCOUNTER — Ambulatory Visit (INDEPENDENT_AMBULATORY_CARE_PROVIDER_SITE_OTHER): Payer: Medicare Other | Admitting: *Deleted

## 2020-04-12 DIAGNOSIS — Z23 Encounter for immunization: Secondary | ICD-10-CM | POA: Diagnosis not present

## 2020-04-12 NOTE — Progress Notes (Signed)
Patient here for high dose flu vaccine. ° °Vaccine given in left deltoid and tolerated well. °

## 2020-05-17 ENCOUNTER — Other Ambulatory Visit (HOSPITAL_BASED_OUTPATIENT_CLINIC_OR_DEPARTMENT_OTHER): Payer: Self-pay | Admitting: Internal Medicine

## 2020-05-17 ENCOUNTER — Ambulatory Visit: Payer: Medicare Other | Attending: Internal Medicine

## 2020-05-17 DIAGNOSIS — Z23 Encounter for immunization: Secondary | ICD-10-CM

## 2020-05-17 MED FILL — PFIZER-BIONTECH COVID-19 VA: 30 | 1 days supply | Qty: 0 | Fill #0

## 2020-05-17 NOTE — Progress Notes (Signed)
° °  Covid-19 Vaccination Clinic  Name:  Marcus Crawford    MRN: [id number] DOB: May 02, 1951  05/17/2020  [name] was observed post Covid-19 immunization for 15 minutes without incident. He was provided with Vaccine Information Sheet and instruction to access the V-Safe system.   [name] was instructed to call 911 with any severe reactions post vaccine:  Difficulty breathing   Swelling of face and throat   A fast heartbeat   A bad rash all over body   Dizziness and weakness   Immunizations Administered    Name Date Dose VIS Date Route   Pfizer COVID-19 Vaccine 05/17/2020 10:06 AM 0.3 mL 04/20/2020 Intramuscular   Manufacturer: [place]   Lot: [id number]   NDC: [address]-5

## 2020-05-27 ENCOUNTER — Other Ambulatory Visit: Payer: Self-pay | Admitting: Internal Medicine

## 2020-05-27 DIAGNOSIS — E785 Hyperlipidemia, unspecified: Secondary | ICD-10-CM

## 2020-06-22 ENCOUNTER — Telehealth: Payer: Self-pay | Admitting: Internal Medicine

## 2020-06-22 NOTE — Telephone Encounter (Signed)
Copied from [place] [id number]. Topic: Medicare AWV >> Jun 22, 2020  1:53 PM Cher Nakai R wrote: Reason for CRM:  Left message for patient to call back and schedule Medicare Annual Wellness Visit (AWV) in office.   If not able to come in office, please offer to do virtually.  45 minute appointment  Last AWV  06/18/2019  Please schedule at anytime with the Nurse Health Advisor.

## 2020-09-12 ENCOUNTER — Telehealth: Payer: Self-pay | Admitting: Pharmacist

## 2020-09-12 NOTE — Progress Notes (Signed)
    Chronic Care Management Pharmacy Assistant   Name: Marcus Crawford  MRN: [id number] DOB: 1951-03-24   Reason for Encounter: General Disease State Call    Recent office visits:  NA Recent consult visits:  [facility] visits:  None in previous 6 months  Medications: Outpatient Encounter Medications as of 09/12/2020  Medication Sig  . cholecalciferol (VITAMIN D) 1000 UNITS tablet Take 1,000 Units by mouth daily.   Marland Kitchen levocetirizine (XYZAL) 5 MG tablet Take 1 tablet (5 mg total) by mouth every evening.  . pravastatin (PRAVACHOL) 10 MG tablet TAKE 1 TABLET(10 MG) BY MOUTH DAILY  . RABEprazole (ACIPHEX) 20 MG tablet Take 1 tablet (20 mg total) by mouth daily. (Patient taking differently: Take 20 mg by mouth every other day. )  . tadalafil (CIALIS) 20 MG tablet TAKE ONE TABLET BY MOUTH ONE TIME DAILY AS NEEDED AS DIRECTED (Patient not taking: Reported on 02/08/2020)   No facility-administered encounter medications on file as of 09/12/2020.    A general adherence call was made to [name] to ask how he has been doing since he last spoke with the clinical pharmacist. The patient states that he has been doing well and that he does not have any new issues. The patient has not seen any other providers or had any medication changes to his meds. Overall the patient states that he is doing well.  I let the patient know that I will pass along the information to the clinical pharmacist Mendel Ryder.   Wendy Poet, [place]  [phone]

## 2020-10-04 DIAGNOSIS — Z8546 Personal history of malignant neoplasm of prostate: Secondary | ICD-10-CM | POA: Diagnosis not present

## 2020-10-04 LAB — PSA: PSA: 0.015

## 2020-10-11 DIAGNOSIS — Z8546 Personal history of malignant neoplasm of prostate: Secondary | ICD-10-CM | POA: Diagnosis not present

## 2020-10-11 DIAGNOSIS — N5201 Erectile dysfunction due to arterial insufficiency: Secondary | ICD-10-CM | POA: Diagnosis not present

## 2020-10-24 ENCOUNTER — Other Ambulatory Visit: Payer: Self-pay | Admitting: Internal Medicine

## 2020-10-24 DIAGNOSIS — K2100 Gastro-esophageal reflux disease with esophagitis, without bleeding: Secondary | ICD-10-CM

## 2020-10-24 MED ORDER — RABEPRAZOLE SODIUM 20 MG PO TBEC: 20.0000 mg | DELAYED_RELEASE_TABLET | Freq: Every day | ORAL | 1 refills | Status: DC

## 2020-11-25 ENCOUNTER — Other Ambulatory Visit: Payer: Self-pay | Admitting: Internal Medicine

## 2020-11-25 DIAGNOSIS — E785 Hyperlipidemia, unspecified: Secondary | ICD-10-CM

## 2020-11-29 ENCOUNTER — Ambulatory Visit: Payer: Medicare Other

## 2020-11-29 NOTE — Progress Notes (Signed)
   Covid-19 Vaccination Clinic  Name:  Marcus Crawford    MRN: [id number] DOB: [birth year]  11/29/2020  [name] was observed post Covid-19 immunization for 15 minutes without incident. He was provided with Vaccine Information Sheet and instruction to access the V-Safe system.   [name] was instructed to call 911 with any severe reactions post vaccine: Marland Kitchen Difficulty breathing  . Swelling of face and throat  . A fast heartbeat  . A bad rash all over body  . Dizziness and weakness

## 2020-12-02 DIAGNOSIS — H5203 Hypermetropia, bilateral: Secondary | ICD-10-CM | POA: Diagnosis not present

## 2020-12-02 DIAGNOSIS — H2513 Age-related nuclear cataract, bilateral: Secondary | ICD-10-CM | POA: Diagnosis not present

## 2020-12-02 DIAGNOSIS — H04123 Dry eye syndrome of bilateral lacrimal glands: Secondary | ICD-10-CM | POA: Diagnosis not present

## 2020-12-05 ENCOUNTER — Other Ambulatory Visit (HOSPITAL_BASED_OUTPATIENT_CLINIC_OR_DEPARTMENT_OTHER): Payer: Self-pay

## 2020-12-05 DIAGNOSIS — Z23 Encounter for immunization: Secondary | ICD-10-CM | POA: Diagnosis not present

## 2020-12-05 MED ORDER — PFIZER-BIONT COVID-19 VAC-TRIS 30 MCG/0.3ML IM SUSP
INTRAMUSCULAR | 0 refills | Status: DC
  Filled 2020-12-05: qty 0.3, 1d supply, fill #0

## 2020-12-06 ENCOUNTER — Encounter: Payer: Self-pay | Admitting: Dermatology

## 2020-12-06 ENCOUNTER — Ambulatory Visit (INDEPENDENT_AMBULATORY_CARE_PROVIDER_SITE_OTHER): Payer: Medicare Other | Admitting: Dermatology

## 2020-12-06 ENCOUNTER — Other Ambulatory Visit (HOSPITAL_BASED_OUTPATIENT_CLINIC_OR_DEPARTMENT_OTHER): Payer: Self-pay

## 2020-12-06 ENCOUNTER — Other Ambulatory Visit: Payer: Self-pay

## 2020-12-06 DIAGNOSIS — D485 Neoplasm of uncertain behavior of skin: Secondary | ICD-10-CM

## 2020-12-06 DIAGNOSIS — L570 Actinic keratosis: Secondary | ICD-10-CM | POA: Diagnosis not present

## 2020-12-06 DIAGNOSIS — Z808 Family history of malignant neoplasm of other organs or systems: Secondary | ICD-10-CM | POA: Diagnosis not present

## 2020-12-06 DIAGNOSIS — Z1283 Encounter for screening for malignant neoplasm of skin: Secondary | ICD-10-CM

## 2020-12-06 NOTE — Patient Instructions (Signed)

## 2020-12-10 ENCOUNTER — Encounter: Payer: Self-pay | Admitting: Dermatology

## 2020-12-11 NOTE — Progress Notes (Signed)
   Follow-Up Visit   Subjective  Marcus Crawford is a 70 y.o. male who presents for the following: Annual Exam (Patient here today for yearly skin check. Per patient he has a lesion that popped up about 3 months ago on the right side of his neck. Per patient no pain, no bleeding, getting larger. No personal history of atypical moles, melanoma or non mole skin cancer. Family history or non mole skin cancer. No family history of atypical moles or melanoma. ).  General skin examination, no lesion right side of neck Location:  Duration:  Quality:  Associated Signs/Symptoms: Modifying Factors:  Severity:  Timing: Context:   Objective  Well appearing patient in no apparent distress; mood and affect are within normal limits. Right Posterior Neck Volcano-like 7 mm papule with central white horn       Torso - Posterior (Back) Waist up skin examination, no atypical pigmented lesions.    All skin waist up examined.   Assessment & Plan    Neoplasm of uncertain behavior of skin Right Posterior Neck  Skin / nail biopsy Type of biopsy: tangential   Informed consent: discussed and consent obtained   Timeout: patient name, date of birth, surgical site, and procedure verified   Procedure prep:  Patient was prepped and draped in usual sterile fashion (Non sterile) Prep type:  Chlorhexidine Anesthesia: the lesion was anesthetized in a standard fashion   Anesthetic:  1% lidocaine w/ epinephrine 1-100,000 local infiltration Instrument used: flexible razor blade   Hemostasis achieved with: ferric subsulfate and electrodesiccation   Outcome: patient tolerated procedure well   Post-procedure details: sterile dressing applied and wound care instructions given   Dressing type: bandage and petrolatum    Destruction of lesion Complexity: simple   Destruction method: electrodesiccation and curettage   Informed consent: discussed and consent obtained   Timeout:  patient name, date of birth,  surgical site, and procedure verified Anesthesia: the lesion was anesthetized in a standard fashion   Anesthetic:  1% lidocaine w/ epinephrine 1-100,000 local infiltration Curettage performed in three different directions: Yes   Electrodesiccation performed over the curetted area: Yes   Curettage cycles:  1 Margin per side (cm):  0.1 Final wound size (cm):  0.5 Hemostasis achieved with:  ferric subsulfate and electrodesiccation Outcome: patient tolerated procedure well with no complications   Post-procedure details: sterile dressing applied and wound care instructions given   Dressing type: bandage and petrolatum    Specimen 1 - Surgical pathology Differential Diagnosis: R/O SCC vs Wart - treated after biopsy  Check Margins: No  Encounter for screening for malignant neoplasm of skin Torso - Posterior (Back)  Annual skin examination      I, Lavonna Monarch, MD, have reviewed all documentation for this visit.  The documentation on 12/11/20 for the exam, diagnosis, procedures, and orders are all accurate and complete.

## 2020-12-13 ENCOUNTER — Telehealth: Payer: Self-pay | Admitting: Dermatology

## 2020-12-13 NOTE — Telephone Encounter (Signed)
Patient left message on office voice mail that he was calling for pathology results from last visit with Stuart Tafeen, MD. 

## 2020-12-13 NOTE — Telephone Encounter (Signed)
Phone call to patient to inform him that his pathology results aren't back yet. Patient aware.

## 2020-12-13 NOTE — Progress Notes (Signed)
   Follow-Up Visit   Subjective  Marcus Crawford is a 70 y.o. male who presents for the following: Annual Exam (Patient here today for yearly skin check. Per patient he has a lesion that popped up about 3 months ago on the right side of his neck. Per patient no pain, no bleeding, getting larger. No personal history of atypical moles, melanoma or non mole skin cancer. Family history or non mole skin cancer. No family history of atypical moles or melanoma. ).  Annual examination, new spot on right side of neck Location:  Duration:  Quality:  Associated Signs/Symptoms: Modifying Factors:  Severity:  Timing: Context:   Objective  Well appearing patient in no apparent distress; mood and affect are within normal limits. Right Posterior Neck Volcano-like 7 mm papule with central white horn       Torso - Posterior (Back) Waist up skin examination, no atypical pigmented lesions.    A full examination was performed including scalp, head, eyes, ears, nose, lips, neck, chest, axillae, abdomen, back, buttocks, bilateral upper extremities, bilateral lower extremities, hands, feet, fingers, toes, fingernails, and toenails. All findings within normal limits unless otherwise noted below.   Assessment & Plan    Neoplasm of uncertain behavior of skin Right Posterior Neck  Skin / nail biopsy Type of biopsy: tangential   Informed consent: discussed and consent obtained   Timeout: patient name, date of birth, surgical site, and procedure verified   Procedure prep:  Patient was prepped and draped in usual sterile fashion (Non sterile) Prep type:  Chlorhexidine Anesthesia: the lesion was anesthetized in a standard fashion   Anesthetic:  1% lidocaine w/ epinephrine 1-100,000 local infiltration Instrument used: flexible razor blade   Hemostasis achieved with: ferric subsulfate and electrodesiccation   Outcome: patient tolerated procedure well   Post-procedure details: sterile dressing applied  and wound care instructions given   Dressing type: bandage and petrolatum    Destruction of lesion Complexity: simple   Destruction method: electrodesiccation and curettage   Informed consent: discussed and consent obtained   Timeout:  patient name, date of birth, surgical site, and procedure verified Anesthesia: the lesion was anesthetized in a standard fashion   Anesthetic:  1% lidocaine w/ epinephrine 1-100,000 local infiltration Curettage performed in three different directions: Yes   Electrodesiccation performed over the curetted area: Yes   Curettage cycles:  1 Margin per side (cm):  0.1 Final wound size (cm):  0.5 Hemostasis achieved with:  ferric subsulfate and electrodesiccation Outcome: patient tolerated procedure well with no complications   Post-procedure details: sterile dressing applied and wound care instructions given   Dressing type: bandage and petrolatum    Specimen 1 - Surgical pathology Differential Diagnosis: R/O SCC vs Wart - treated after biopsy  Check Margins: No  Encounter for screening for malignant neoplasm of skin Torso - Posterior (Back)  Annual skin examination      I, Lavonna Monarch, MD, have reviewed all documentation for this visit.  The documentation on 12/14/20 for the exam, diagnosis, procedures, and orders are all accurate and complete.     I, Lavonna Monarch, MD, have reviewed all documentation for this visit.  The documentation on 12/13/20 for the exam, diagnosis, procedures, and orders are all accurate and complete.

## 2020-12-15 ENCOUNTER — Telehealth: Payer: Self-pay | Admitting: Dermatology

## 2020-12-15 NOTE — Telephone Encounter (Signed)
Path to patient. No treatment necessary.

## 2020-12-15 NOTE — Telephone Encounter (Signed)
He left message requesting results, ST

## 2021-01-13 ENCOUNTER — Telehealth: Payer: Self-pay | Admitting: Pharmacist

## 2021-01-13 NOTE — Progress Notes (Signed)
    Chronic Care Management Pharmacy Assistant   Name: Marcus Crawford  MRN: [id number] DOB: 19-Mar-1951   Reason for Encounter: Disease State   Conditions to be addressed/monitored: General Call   Recent office visits:  None ID  Recent consult visits:  None ID  Hospital visits:  None in previous 6 months  Medications: Outpatient Encounter Medications as of 01/13/2021  Medication Sig   cholecalciferol (VITAMIN D) 1000 UNITS tablet Take 1,000 Units by mouth daily.    COVID-19 mRNA Vac-TriS, Pfizer, (PFIZER-BIONT COVID-19 VAC-TRIS) SUSP injection Inject into the muscle.   COVID-19 mRNA vaccine, Pfizer, 30 MCG/0.3ML injection AS DIRECTED   levocetirizine (XYZAL) 5 MG tablet Take 1 tablet (5 mg total) by mouth every evening.   pravastatin (PRAVACHOL) 10 MG tablet TAKE 1 TABLET(10 MG) BY MOUTH DAILY   RABEprazole (ACIPHEX) 20 MG tablet Take 1 tablet (20 mg total) by mouth daily.   tadalafil (CIALIS) 20 MG tablet TAKE ONE TABLET BY MOUTH ONE TIME DAILY AS NEEDED AS DIRECTED   No facility-administered encounter medications on file as of 01/13/2021.    Pharmacist Review  Have you had any problems recently with your health? Patient states that he is doing well and does not have any new health issues  Have you had any problems with your pharmacy? Patient states that he does not have any problems with getting his medications or the cost of medications from the pharmacy  What issues or side effects are you having with your medications? Patient states that he does not have any side effects from medications  What would you like me to pass along to [facility] for them to help you with?  Patient states that he is doing well and does not have any concerns about his health or medications at this time  What can we do to take care of you better?  Patient stated he is fine at this time  Star Rating Drugs: Pravastatin 11/25/20 90 ds  [facility] Pharmacist  Assistant [phone]   Time spent:20

## 2021-01-27 DIAGNOSIS — I129 Hypertensive chronic kidney disease with stage 1 through stage 4 chronic kidney disease, or unspecified chronic kidney disease: Secondary | ICD-10-CM | POA: Diagnosis not present

## 2021-01-27 DIAGNOSIS — N2581 Secondary hyperparathyroidism of renal origin: Secondary | ICD-10-CM | POA: Diagnosis not present

## 2021-01-27 DIAGNOSIS — Z8679 Personal history of other diseases of the circulatory system: Secondary | ICD-10-CM | POA: Diagnosis not present

## 2021-01-27 DIAGNOSIS — N183 Chronic kidney disease, stage 3 unspecified: Secondary | ICD-10-CM | POA: Diagnosis not present

## 2021-01-30 ENCOUNTER — Other Ambulatory Visit: Payer: Self-pay

## 2021-01-30 ENCOUNTER — Ambulatory Visit (INDEPENDENT_AMBULATORY_CARE_PROVIDER_SITE_OTHER): Payer: Medicare Other | Admitting: Internal Medicine

## 2021-01-30 ENCOUNTER — Encounter (HOSPITAL_COMMUNITY): Payer: Self-pay | Admitting: Internal Medicine

## 2021-01-30 ENCOUNTER — Encounter: Payer: Self-pay | Admitting: Internal Medicine

## 2021-01-30 VITALS — BP 126/84 | HR 60 | Temp 98.0°F | Ht 73.0 in | Wt 217.0 lb

## 2021-01-30 DIAGNOSIS — R739 Hyperglycemia, unspecified: Secondary | ICD-10-CM | POA: Diagnosis not present

## 2021-01-30 DIAGNOSIS — R5383 Other fatigue: Secondary | ICD-10-CM | POA: Insufficient documentation

## 2021-01-30 DIAGNOSIS — I4510 Unspecified right bundle-branch block: Secondary | ICD-10-CM

## 2021-01-30 DIAGNOSIS — D539 Nutritional anemia, unspecified: Secondary | ICD-10-CM | POA: Diagnosis not present

## 2021-01-30 DIAGNOSIS — R5382 Chronic fatigue, unspecified: Secondary | ICD-10-CM | POA: Diagnosis not present

## 2021-01-30 DIAGNOSIS — R9431 Abnormal electrocardiogram [ECG] [EKG]: Secondary | ICD-10-CM | POA: Diagnosis not present

## 2021-01-30 LAB — BASIC METABOLIC PANEL
BUN: 22 mg/dL (ref 6–23)
CO2: 29 mEq/L (ref 19–32)
Calcium: 9.6 mg/dL (ref 8.4–10.5)
Chloride: 104 mEq/L (ref 96–112)
Creatinine, Ser: 1.46 mg/dL (ref 0.40–1.50)
GFR: 47.76 mL/min — ABNORMAL LOW (ref >60.00)
Glucose, Bld: 96 mg/dL (ref 70–99)
Potassium: 4.8 mEq/L (ref 3.5–5.1)
Sodium: 140 mEq/L (ref 135–145)

## 2021-01-30 LAB — CBC WITH DIFFERENTIAL/PLATELET
Basophils Absolute: 0 10*3/uL (ref 0.0–0.1)
Basophils Relative: 0.9 % (ref 0.0–3.0)
Eosinophils Absolute: 0.1 10*3/uL (ref 0.0–0.7)
Eosinophils Relative: 2.7 % (ref 0.0–5.0)
HCT: 45 % (ref 39.0–52.0)
Hemoglobin: 14.9 g/dL (ref 13.0–17.0)
Lymphocytes Relative: 28.7 % (ref 12.0–46.0)
Lymphs Abs: 1.3 10*3/uL (ref 0.7–4.0)
MCHC: 33 g/dL (ref 30.0–36.0)
MCV: 90.9 fl (ref 78.0–100.0)
Monocytes Absolute: 0.5 10*3/uL (ref 0.1–1.0)
Monocytes Relative: 10.3 % (ref 3.0–12.0)
Neutro Abs: 2.6 10*3/uL (ref 1.4–7.7)
Neutrophils Relative %: 57.4 % (ref 43.0–77.0)
Platelets: 152 10*3/uL (ref 150.0–400.0)
RBC: 4.95 Mil/uL (ref 4.22–5.81)
RDW: 13.9 % (ref 11.5–15.5)
WBC: 4.5 10*3/uL (ref 4.0–10.5)

## 2021-01-30 LAB — IRON: Iron: 123 ug/dL (ref 42–165)

## 2021-01-30 LAB — HEMOGLOBIN A1C: Hgb A1c MFr Bld: 6.1 % (ref 4.6–6.5)

## 2021-01-30 LAB — HEPATIC FUNCTION PANEL
ALT: 18 U/L (ref 0–53)
AST: 9 U/L (ref 0–37)
Albumin: 4.2 g/dL (ref 3.5–5.2)
Alkaline Phosphatase: 64 U/L (ref 39–117)
Bilirubin, Direct: 0.1 mg/dL (ref 0.0–0.3)
Total Bilirubin: 0.7 mg/dL (ref 0.2–1.2)
Total Protein: 6.6 g/dL (ref 6.0–8.3)

## 2021-01-30 LAB — VITAMIN B12: Vitamin B-12: 459 pg/mL (ref 211–911)

## 2021-01-30 LAB — FERRITIN: Ferritin: 111.4 ng/mL (ref 22.0–322.0)

## 2021-01-30 LAB — FOLATE: Folate: >24.4 ng/mL (ref >5.9)

## 2021-01-30 NOTE — Patient Instructions (Signed)
[employer] medicine (25th ed., pp. [phone]). [place], [place]: Elsevier.">  Anemia  Anemia is a condition in which there is not enough red blood cells or hemoglobin in the blood. Hemoglobin is a substance in red blood cells thatcarries oxygen. When you do not have enough red blood cells or hemoglobin (are anemic), your body cannot get enough oxygen and your organs may not work properly. Asa result, you may feel very tired or have other problems. What are the causes? Common causes of anemia include: Excessive bleeding. Anemia can be caused by excessive bleeding inside or outside the body, including bleeding from the intestines or from heavy menstrual periods in females. Poor nutrition. Long-lasting (chronic) kidney, thyroid, and liver disease. Bone marrow disorders, spleen problems, and blood disorders. Cancer and treatments for cancer. HIV (human immunodeficiency virus) and AIDS (acquired immunodeficiency syndrome). Infections, medicines, and autoimmune disorders that destroy red blood cells. What are the signs or symptoms? Symptoms of this condition include: Minor weakness. Dizziness. Headache, or difficulties concentrating and sleeping. Heartbeats that feel irregular or faster than normal (palpitations). Shortness of breath, especially with exercise. Pale skin, lips, and nails, or cold hands and feet. Indigestion and nausea. Symptoms may occur suddenly or develop slowly. If your anemia is mild, you maynot have symptoms. How is this diagnosed? This condition is diagnosed based on blood tests, your medical history, and a physical exam. In some cases, a test may be needed in which cells are removed from the soft tissue inside of a bone and looked at under a microscope (bone marrow biopsy). Your health care provider may also check your stool (feces) for blood and may do additional testing to look for the cause of yourbleeding. Other tests may include: Imaging tests, such as a CT scan or  MRI. A procedure to see inside your esophagus and stomach (endoscopy). A procedure to see inside your colon and rectum (colonoscopy). How is this treated? Treatment for this condition depends on the cause. If you continue to lose a lot of blood, you may need to be treated at a hospital. Treatment may include: Taking supplements of iron, vitamin K59, or folic acid. Taking a hormone medicine (erythropoietin) that can help to stimulate red blood cell growth. Having a blood transfusion. This may be needed if you lose a lot of blood. Making changes to your diet. Having surgery to remove your spleen. Follow these instructions at home: Take over-the-counter and prescription medicines only as told by your health care provider. Take supplements only as told by your health care provider. Follow any diet instructions that you were given by your health care provider. Keep all follow-up visits as told by your health care provider. This is important. Contact a health care provider if: You develop new bleeding anywhere in the body. Get help right away if: You are very weak. You are short of breath. You have pain in your abdomen or chest. You are dizzy or feel faint. You have trouble concentrating. You have bloody stools, black stools, or tarry stools. You vomit repeatedly or you vomit up blood. These symptoms may represent a serious problem that is an emergency. Do not wait to see if the symptoms will go away. Get medical help right away. Call your local emergency services (911 in the [place] Do not drive yourself to the hospital. Summary Anemia is a condition in which you do not have enough red blood cells or enough of a substance in your red blood cells that carries oxygen (hemoglobin). Symptoms may occur suddenly  or develop slowly. If your anemia is mild, you may not have symptoms. This condition is diagnosed with blood tests, a medical history, and a physical exam. Other tests may be  needed. Treatment for this condition depends on the cause of the anemia. This information is not intended to replace advice given to you by your health care provider. Make sure you discuss any questions you have with your healthcare provider. Document Revised: 05/26/2019 Document Reviewed: 05/26/2019 Elsevier Patient Education  2022 [employer].

## 2021-01-30 NOTE — Progress Notes (Signed)
bb  Subjective:  Patient ID: Marcus Crawford, male    DOB: 06-26-1951  Age: 70 y.o. MRN: [id number]  CC: Anemia  This visit occurred during the SARS-CoV-2 public health emergency.  Safety protocols were in place, including screening questions prior to the visit, additional usage of staff PPE, and extensive cleaning of exam room while observing appropriate contact time as indicated for disinfecting solutions.    HPI Marcus Crawford presents for f/up -  About 3 weeks ago he developed the acute onset of weakness, fatigue, and hands and feet feeling cold.  Those symptoms have gradually improved.  He tells me that about 3 days ago he saw a nephrologist and was told that he is anemic and has an abnormal thyroid level.  He is active and denies any recent episodes of chest pain, shortness of breath, diaphoresis, palpitations, or edema.  Outpatient Medications Prior to Visit  Medication Sig Dispense Refill   cholecalciferol (VITAMIN D) 1000 UNITS tablet Take 1,000 Units by mouth daily.      COVID-19 mRNA Vac-TriS, Pfizer, (PFIZER-BIONT COVID-19 VAC-TRIS) SUSP injection Inject into the muscle. 0.3 mL 0   COVID-19 mRNA vaccine, Pfizer, 30 MCG/0.3ML injection AS DIRECTED .3 mL 0   levocetirizine (XYZAL) 5 MG tablet Take 1 tablet (5 mg total) by mouth every evening. 90 tablet 1   pravastatin (PRAVACHOL) 10 MG tablet TAKE 1 TABLET(10 MG) BY MOUTH DAILY 90 tablet 1   RABEprazole (ACIPHEX) 20 MG tablet Take 1 tablet (20 mg total) by mouth daily. 90 tablet 1   tadalafil (CIALIS) 20 MG tablet TAKE ONE TABLET BY MOUTH ONE TIME DAILY AS NEEDED AS DIRECTED     No facility-administered medications prior to visit.    ROS Review of Systems  Constitutional:  Positive for fatigue. Negative for appetite change, diaphoresis and unexpected weight change.  HENT: Negative.    Eyes: Negative.   Respiratory:  Negative for cough, chest tightness, shortness of breath and wheezing.   Cardiovascular:  Negative for chest  pain, palpitations and leg swelling.  Gastrointestinal:  Positive for constipation. Negative for abdominal pain, diarrhea and nausea.  Endocrine: Positive for cold intolerance.  Genitourinary: Negative.  Negative for difficulty urinating.  Musculoskeletal: Negative.  Negative for arthralgias and myalgias.  Skin: Negative.   Neurological:  Positive for weakness. Negative for dizziness, light-headedness and numbness.  Hematological: Negative.  Negative for adenopathy. Does not bruise/bleed easily.  Psychiatric/Behavioral: Negative.     Objective:  BP 126/84 (BP Location: Left Arm, Patient Position: Sitting, Cuff Size: Large)   Pulse 60   Temp 98 F (36.7 C) (Oral)   Ht '6\' 1"'$  (1.854 m)   Wt 217 lb (98.4 kg)   SpO2 96%   BMI 28.63 kg/m   BP Readings from Last 3 Encounters:  01/30/21 126/84  01/26/20 126/80  10/27/19 126/80    Wt Readings from Last 3 Encounters:  01/30/21 217 lb (98.4 kg)  01/26/20 (!) 221 lb (100.2 kg)  10/27/19 223 lb (101.2 kg)    Physical Exam Vitals reviewed.  Constitutional:      Appearance: Normal appearance.  HENT:     Nose: Nose normal.     Mouth/Throat:     Mouth: Mucous membranes are moist.  Eyes:     Conjunctiva/sclera: Conjunctivae normal.  Cardiovascular:     Rate and Rhythm: Regular rhythm. Bradycardia present.     Pulses: Normal pulses.     Heart sounds: No murmur heard.    Comments: EKG- Sinus bradycardia,  59 bpm New RBBB No Q waves O/w normal EKG Pulmonary:     Effort: Pulmonary effort is normal.     Breath sounds: No stridor. No wheezing, rhonchi or rales.  Abdominal:     General: Abdomen is flat. Bowel sounds are normal. There is no distension.     Palpations: Abdomen is soft. There is no hepatomegaly, splenomegaly or mass.  Musculoskeletal:     Cervical back: Neck supple.     Right lower leg: No edema.     Left lower leg: No edema.  Skin:    General: Skin is warm and dry.     Coloration: Skin is not pale.  Neurological:      General: No focal deficit present.     Mental Status: He is alert.  Psychiatric:        Mood and Affect: Mood normal.        Behavior: Behavior normal.    Lab Results  Component Value Date   WBC 4.5 01/30/2021   HGB 14.9 01/30/2021   HCT 45.0 01/30/2021   PLT 152.0 01/30/2021   GLUCOSE 96 01/30/2021   CHOL 186 01/26/2020   TRIG 196 (H) 01/26/2020   HDL 39 (L) 01/26/2020   LDLDIRECT 58.0 01/19/2019   LDLCALC 115 (H) 01/26/2020   ALT 18 01/30/2021   AST 9 01/30/2021   NA 140 01/30/2021   K 4.8 01/30/2021   CL 104 01/30/2021   CREATININE 1.46 01/30/2021   BUN 22 01/30/2021   CO2 29 01/30/2021   TSH 2.74 01/30/2021   PSA 0.015 10/04/2020   INR 1.12 02/28/2012   HGBA1C 6.1 01/30/2021   MICROALBUR 0.4 10/14/2008    MR Brain Wo Contrast  Result Date: 01/19/2016 CLINICAL DATA:  70 year old hypertensive male with pain above the left ear and left jaw extending to back of head which started 3 months ago. No injury. Prostate cancer post prostatectomy (2015). Initial encounter. EXAM: MRI HEAD WITHOUT CONTRAST TECHNIQUE: Multiplanar, multiecho pulse sequences of the brain and surrounding structures were obtained without intravenous contrast. COMPARISON:  None. FINDINGS: No acute infarct or intracranial hemorrhage. Very mild chronic microvascular changes. Mild global atrophy without hydrocephalus. No intracranial mass lesion noted on this unenhanced exam. Small right vertebral artery may end in a posterior inferior cerebellar artery distribution. Left vertebral artery and basilar artery are ectatic. Slight impression left lateral medulla. No compression of the cisternal aspect of the fifth cranial nerve noted. Minimal partial opacification inferior left mastoid air cells. Mild polypoid opacification inferior left maxillary sinus. Degenerative changes C1 occipital and C1-C2 articulation. No acute orbital abnormality. IMPRESSION: No acute infarct or intracranial hemorrhage. Very mild chronic  microvascular changes. Mild global atrophy without hydrocephalus. No intracranial mass lesion noted on this unenhanced exam. Small right vertebral artery may end in a posterior inferior cerebellar artery distribution. Left vertebral artery and basilar artery are ectatic. Slight impression left lateral medulla. No compression of the cisternal aspect of the fifth cranial nerve noted. Minimal partial opacification inferior left mastoid air cells. Mild polypoid opacification inferior left maxillary sinus. Degenerative changes C1 occipital and C1-C2 articulation. Electronically Signed   By: Genia Del M.D.   On: 01/19/2016 08:35    Assessment & Plan:   Jashan was seen today for anemia.  Diagnoses and all orders for this visit:  Chronic fatigue- His labs are negative for secondary causes of fatigue.  I recommended that he undergo a an evaluation for ischemia. -     CBC with Differential/Platelet; Future -  Thyroid Panel With TSH; Future -     Hepatic function panel; Future -     EKG 12-Lead -     MYOCARDIAL PERFUSION IMAGING; Future -     Cardiac Stress Test: Informed Consent Details: Physician/Practitioner Attestation; Transcribe to consent form and obtain patient signature; Future -     Hepatic function panel -     Thyroid Panel With TSH -     CBC with Differential/Platelet  Deficiency anemia- His H&H and iron levels are normal now. -     CBC with Differential/Platelet; Future -     Vitamin B12; Future -     Iron; Future -     Folate; Future -     Thyroid Panel With TSH; Future -     Vitamin B1; Future -     Ferritin; Future -     Ferritin -     Vitamin B1 -     Thyroid Panel With TSH -     Folate -     Iron -     Vitamin B12 -     CBC with Differential/Platelet  Chronic hyperglycemia- He is prediabetic. -     Basic metabolic panel; Future -     Hemoglobin A1c; Future -     Hemoglobin A1c -     Basic metabolic panel  RBBB (right bundle branch block) -     MYOCARDIAL  PERFUSION IMAGING; Future -     Cardiac Stress Test: Informed Consent Details: Physician/Practitioner Attestation; Transcribe to consent form and obtain patient signature; Future  Abnormal electrocardiogram -     MYOCARDIAL PERFUSION IMAGING; Future -     Cardiac Stress Test: Informed Consent Details: Physician/Practitioner Attestation; Transcribe to consent form and obtain patient signature; Future  I am having Jeneen Rinks T. Trotti "[name]" maintain his cholecalciferol, tadalafil, levocetirizine, COVID-19 mRNA vaccine ([place]), RABEprazole, pravastatin, and Pfizer-BioNT COVID-19 Vac-TriS.  No orders of the defined types were placed in this encounter.    Follow-up: Return in about 3 months (around 05/02/2021).  Scarlette Calico, MD

## 2021-01-31 ENCOUNTER — Telehealth: Payer: Medicare Other

## 2021-02-01 ENCOUNTER — Telehealth: Payer: Self-pay

## 2021-02-01 NOTE — Telephone Encounter (Signed)
Spoke with the patient, detailed instructions given. He stated that he would be here for his test. Asked to call back with any questions. S.Janea Schwenn EMTP 

## 2021-02-03 LAB — THYROID PANEL WITH TSH
Free Thyroxine Index: 1.9 (ref 1.4–3.8)
T3 Uptake: 36 % — ABNORMAL HIGH (ref 22–35)
T4, Total: 5.4 ug/dL (ref 4.9–10.5)
TSH: 2.74 mIU/L (ref 0.40–4.50)

## 2021-02-03 LAB — VITAMIN B1: Vitamin B1 (Thiamine): 13 nmol/L (ref 8–30)

## 2021-02-07 ENCOUNTER — Other Ambulatory Visit: Payer: Self-pay

## 2021-02-07 ENCOUNTER — Ambulatory Visit (HOSPITAL_COMMUNITY): Payer: Medicare Other | Attending: Cardiology

## 2021-02-07 DIAGNOSIS — R5382 Chronic fatigue, unspecified: Secondary | ICD-10-CM

## 2021-02-07 DIAGNOSIS — I4510 Unspecified right bundle-branch block: Secondary | ICD-10-CM | POA: Diagnosis not present

## 2021-02-07 DIAGNOSIS — R9431 Abnormal electrocardiogram [ECG] [EKG]: Secondary | ICD-10-CM

## 2021-02-07 LAB — MYOCARDIAL PERFUSION IMAGING
LV dias vol: 86 mL (ref 62–150)
LV sys vol: 31 mL
Peak HR: 96 {beats}/min
Rest HR: 54 {beats}/min
SDS: 1
SRS: 0
SSS: 1
TID: 0.91

## 2021-02-07 MED ORDER — TECHNETIUM TC 99M TETROFOSMIN IV KIT
10.8000 | PACK | Freq: Once | INTRAVENOUS | Status: AC | PRN
  Administered 2021-02-07: 10.8 via INTRAVENOUS
  Filled 2021-02-07: qty 11

## 2021-02-07 MED ORDER — TECHNETIUM TC 99M TETROFOSMIN IV KIT
30.2000 | PACK | Freq: Once | INTRAVENOUS | Status: AC | PRN
  Administered 2021-02-07: 30.2 via INTRAVENOUS
  Filled 2021-02-07: qty 31

## 2021-02-07 MED ORDER — REGADENOSON 0.4 MG/5ML IV SOLN
0.4000 mg | Freq: Once | INTRAVENOUS | Status: AC
  Administered 2021-02-07: 0.4 mg via INTRAVENOUS

## 2021-02-25 ENCOUNTER — Other Ambulatory Visit: Payer: Self-pay | Admitting: Internal Medicine

## 2021-02-25 DIAGNOSIS — E785 Hyperlipidemia, unspecified: Secondary | ICD-10-CM

## 2021-03-07 ENCOUNTER — Other Ambulatory Visit (HOSPITAL_BASED_OUTPATIENT_CLINIC_OR_DEPARTMENT_OTHER): Payer: Self-pay

## 2021-04-07 ENCOUNTER — Other Ambulatory Visit: Payer: Self-pay

## 2021-04-07 ENCOUNTER — Ambulatory Visit (INDEPENDENT_AMBULATORY_CARE_PROVIDER_SITE_OTHER): Payer: Medicare Other

## 2021-04-07 DIAGNOSIS — Z23 Encounter for immunization: Secondary | ICD-10-CM | POA: Diagnosis not present

## 2021-04-28 ENCOUNTER — Ambulatory Visit: Payer: Medicare Other | Attending: Internal Medicine

## 2021-04-28 DIAGNOSIS — Z23 Encounter for immunization: Secondary | ICD-10-CM

## 2021-04-28 NOTE — Progress Notes (Signed)
   Covid-19 Vaccination Clinic  Name:  Marcus Crawford    MRN: [id number] DOB: 09/14/1950  04/28/2021  Marcus Crawford was observed post Covid-19 immunization for 15 minutes without incident. He was provided with Vaccine Information Sheet and instruction to access the V-Safe system.   Marcus Crawford was instructed to call 911 with any severe reactions post vaccine: Difficulty breathing  Swelling of face and throat  A fast heartbeat  A bad rash all over body  Dizziness and weakness   Immunizations Administered     Name Date Dose VIS Date Route   Pfizer Covid-19 Vaccine Bivalent Booster 04/28/2021  9:57 AM 0.3 mL 03/01/2021 Intramuscular   Manufacturer: [employer]   Lot: [id number]   [place]: [phone]

## 2021-05-02 ENCOUNTER — Encounter: Payer: Self-pay | Admitting: Internal Medicine

## 2021-05-02 ENCOUNTER — Other Ambulatory Visit: Payer: Self-pay

## 2021-05-02 ENCOUNTER — Ambulatory Visit (INDEPENDENT_AMBULATORY_CARE_PROVIDER_SITE_OTHER): Payer: Medicare Other | Admitting: Internal Medicine

## 2021-05-02 VITALS — BP 112/76 | HR 65 | Temp 98.5°F | Ht 73.0 in | Wt 218.0 lb

## 2021-05-02 DIAGNOSIS — E785 Hyperlipidemia, unspecified: Secondary | ICD-10-CM | POA: Diagnosis not present

## 2021-05-02 LAB — LIPID PANEL
Cholesterol: 145 mg/dL (ref 0–200)
HDL: 37.2 mg/dL — ABNORMAL LOW (ref >39.00)
NonHDL: 107.6
Total CHOL/HDL Ratio: 4
Triglycerides: 234 mg/dL — ABNORMAL HIGH (ref 0.0–149.0)
VLDL: 46.8 mg/dL — ABNORMAL HIGH (ref 0.0–40.0)

## 2021-05-02 LAB — LDL CHOLESTEROL, DIRECT: Direct LDL: 80 mg/dL

## 2021-05-02 NOTE — Progress Notes (Signed)
Subjective:  Patient ID: Marcus Crawford, male    DOB: 1950/07/06  Age: 70 y.o. MRN: [id number]  CC: Hyperlipidemia and Gastroesophageal Reflux  This visit occurred during the SARS-CoV-2 public health emergency.  Safety protocols were in place, including screening questions prior to the visit, additional usage of staff PPE, and extensive cleaning of exam room while observing appropriate contact time as indicated for disinfecting solutions.    HPI Marcus Crawford presents for f/up -  His fatigue has resolved.  He is active and denies chest pain, shortness of breath, diaphoresis, or edema.  Outpatient Medications Prior to Visit  Medication Sig Dispense Refill   cholecalciferol (VITAMIN D) 1000 UNITS tablet Take 1,000 Units by mouth daily.      levocetirizine (XYZAL) 5 MG tablet Take 1 tablet (5 mg total) by mouth every evening. 90 tablet 1   pravastatin (PRAVACHOL) 10 MG tablet TAKE 1 TABLET(10 MG) BY MOUTH DAILY 90 tablet 1   RABEprazole (ACIPHEX) 20 MG tablet Take 1 tablet (20 mg total) by mouth daily. 90 tablet 1   tadalafil (CIALIS) 20 MG tablet TAKE ONE TABLET BY MOUTH ONE TIME DAILY AS NEEDED AS DIRECTED     COVID-19 mRNA Vac-TriS, Pfizer, (PFIZER-BIONT COVID-19 VAC-TRIS) SUSP injection Inject into the muscle. 0.3 mL 0   COVID-19 mRNA vaccine, Pfizer, 30 MCG/0.3ML injection AS DIRECTED .3 mL 0   No facility-administered medications prior to visit.    ROS Review of Systems  Constitutional:  Negative for diaphoresis, fatigue and unexpected weight change.  HENT: Negative.    Eyes:  Negative for discharge.  Respiratory:  Negative for cough, chest tightness, shortness of breath and wheezing.   Cardiovascular:  Negative for chest pain, palpitations and leg swelling.  Gastrointestinal:  Negative for abdominal pain, diarrhea, nausea and vomiting.  Endocrine: Negative.   Genitourinary: Negative.  Negative for difficulty urinating.  Musculoskeletal:  Negative for arthralgias and  myalgias.  Skin: Negative.   Neurological: Negative.  Negative for dizziness and weakness.  Hematological:  Negative for adenopathy. Does not bruise/bleed easily.  Psychiatric/Behavioral: Negative.     Objective:  BP 112/76 (BP Location: Left Arm, Patient Position: Sitting, Cuff Size: Large)   Pulse 65   Temp 98.5 F (36.9 C) (Oral)   Ht 6\' 1"  (1.854 m)   Wt 218 lb (98.9 kg)   SpO2 97%   BMI 28.76 kg/m   BP Readings from Last 3 Encounters:  05/02/21 112/76  01/30/21 126/84  01/26/20 126/80    Wt Readings from Last 3 Encounters:  05/02/21 218 lb (98.9 kg)  02/07/21 217 lb (98.4 kg)  01/30/21 217 lb (98.4 kg)    Physical Exam Vitals reviewed.  HENT:     Mouth/Throat:     Mouth: Mucous membranes are moist.  Eyes:     Conjunctiva/sclera: Conjunctivae normal.  Cardiovascular:     Rate and Rhythm: Normal rate and regular rhythm.     Heart sounds: No murmur heard. Pulmonary:     Effort: Pulmonary effort is normal.     Breath sounds: No stridor. No wheezing, rhonchi or rales.  Abdominal:     General: Abdomen is flat.     Palpations: There is no mass.     Tenderness: There is no abdominal tenderness. There is no guarding.  Musculoskeletal:        General: Normal range of motion.     Cervical back: Neck supple.     Right lower leg: No edema.  Lymphadenopathy:  Cervical: No cervical adenopathy.  Skin:    General: Skin is warm.  Neurological:     General: No focal deficit present.     Mental Status: He is alert.  Psychiatric:        Mood and Affect: Mood normal.        Behavior: Behavior normal.    Lab Results  Component Value Date   WBC 4.5 01/30/2021   HGB 14.9 01/30/2021   HCT 45.0 01/30/2021   PLT 152.0 01/30/2021   GLUCOSE 96 01/30/2021   CHOL 145 05/02/2021   TRIG 234.0 (H) 05/02/2021   HDL 37.20 (L) 05/02/2021   LDLDIRECT 80.0 05/02/2021   LDLCALC 115 (H) 01/26/2020   ALT 18 01/30/2021   AST 9 01/30/2021   NA 140 01/30/2021   K 4.8 01/30/2021    CL 104 01/30/2021   CREATININE 1.46 01/30/2021   BUN 22 01/30/2021   CO2 29 01/30/2021   TSH 2.74 01/30/2021   PSA 0.015 10/04/2020   INR 1.12 02/28/2012   HGBA1C 6.1 01/30/2021   MICROALBUR 0.4 10/14/2008    MR Brain Wo Contrast  Result Date: 01/19/2016 CLINICAL DATA:  70 year old hypertensive male with pain above the left ear and left jaw extending to back of head which started 3 months ago. No injury. Prostate cancer post prostatectomy (2015). Initial encounter. EXAM: MRI HEAD WITHOUT CONTRAST TECHNIQUE: Multiplanar, multiecho pulse sequences of the brain and surrounding structures were obtained without intravenous contrast. COMPARISON:  None. FINDINGS: No acute infarct or intracranial hemorrhage. Very mild chronic microvascular changes. Mild global atrophy without hydrocephalus. No intracranial mass lesion noted on this unenhanced exam. Small right vertebral artery may end in a posterior inferior cerebellar artery distribution. Left vertebral artery and basilar artery are ectatic. Slight impression left lateral medulla. No compression of the cisternal aspect of the fifth cranial nerve noted. Minimal partial opacification inferior left mastoid air cells. Mild polypoid opacification inferior left maxillary sinus. Degenerative changes C1 occipital and C1-C2 articulation. No acute orbital abnormality. IMPRESSION: No acute infarct or intracranial hemorrhage. Very mild chronic microvascular changes. Mild global atrophy without hydrocephalus. No intracranial mass lesion noted on this unenhanced exam. Small right vertebral artery may end in a posterior inferior cerebellar artery distribution. Left vertebral artery and basilar artery are ectatic. Slight impression left lateral medulla. No compression of the cisternal aspect of the fifth cranial nerve noted. Minimal partial opacification inferior left mastoid air cells. Mild polypoid opacification inferior left maxillary sinus. Degenerative changes C1  occipital and C1-C2 articulation. Electronically Signed   By: Genia Del M.D.   On: 01/19/2016 08:35    Assessment & Plan:   Marcus Crawford was seen today for hyperlipidemia and gastroesophageal reflux.  Diagnoses and all orders for this visit:  Hyperlipidemia with target LDL less than 130- LDL goal achieved. Doing well on the statin  -     Lipid panel; Future -     Lipid panel  Other orders -     LDL cholesterol, direct  I have discontinued Marcus Crawford "Jim"'s COVID-19 mRNA vaccine Therapist, music) and Pfizer-BioNT COVID-19 Vac-TriS. I am also having him maintain his cholecalciferol, tadalafil, levocetirizine, RABEprazole, and pravastatin.  No orders of the defined types were placed in this encounter.    Follow-up: No follow-ups on file.  Scarlette Calico, MD

## 2021-05-22 ENCOUNTER — Other Ambulatory Visit (HOSPITAL_BASED_OUTPATIENT_CLINIC_OR_DEPARTMENT_OTHER): Payer: Self-pay

## 2021-05-22 DIAGNOSIS — Z23 Encounter for immunization: Secondary | ICD-10-CM | POA: Diagnosis not present

## 2021-05-22 MED ORDER — PFIZER COVID-19 VAC BIVALENT 30 MCG/0.3ML IM SUSP
INTRAMUSCULAR | 0 refills | Status: DC
  Filled 2021-05-22: qty 0.3, 1d supply, fill #0

## 2021-07-19 ENCOUNTER — Encounter: Payer: Self-pay | Admitting: Gastroenterology

## 2021-07-24 ENCOUNTER — Other Ambulatory Visit: Payer: Self-pay | Admitting: Internal Medicine

## 2021-07-24 DIAGNOSIS — J301 Allergic rhinitis due to pollen: Secondary | ICD-10-CM

## 2021-07-31 ENCOUNTER — Telehealth: Payer: Self-pay

## 2021-07-31 DIAGNOSIS — J301 Allergic rhinitis due to pollen: Secondary | ICD-10-CM

## 2021-07-31 MED ORDER — LEVOCETIRIZINE DIHYDROCHLORIDE 5 MG PO TABS: ORAL_TABLET | ORAL | 1 refills | Status: DC

## 2021-07-31 NOTE — Telephone Encounter (Addendum)
Pt is calling requesting that the refill for: levocetirizine (XYZAL) 5 MG tablet Be sent to another Pharmacy.  Pharmacy: [id number] [place], [place]. AT [place] states that it is cheaper at [facility]

## 2021-08-21 ENCOUNTER — Ambulatory Visit (AMBULATORY_SURGERY_CENTER): Payer: Medicare Other | Admitting: *Deleted

## 2021-08-21 ENCOUNTER — Other Ambulatory Visit: Payer: Self-pay

## 2021-08-21 VITALS — Ht 72.0 in | Wt 220.0 lb

## 2021-08-21 DIAGNOSIS — Z800 Family history of malignant neoplasm of digestive organs: Secondary | ICD-10-CM

## 2021-08-21 MED ORDER — NA SULFATE-K SULFATE-MG SULF 17.5-3.13-1.6 GM/177ML PO SOLN: 1.0000 | Freq: Once | ORAL | 0 refills | Status: AC

## 2021-08-21 NOTE — Progress Notes (Signed)
No egg or soy allergy known to patient   issues known to pt with past sedation with any surgeries or procedures PONV  Patient denies ever being told they had issues or difficulty with intubation  No FH of Malignant Hyperthermia Pt is not on diet pills Pt is not on  home 02  Pt is not on blood thinners  Pt states  issues with constipation on occ not daily  No A fib or A flutter  Pt is fully vaccinated  for Covid    NO PA's for preps discussed with pt In PV today  Discussed with pt there will be an out-of-pocket cost for prep and that varies from $0 to 70 +  dollars - pt verbalized understanding   Pt given the option in PV today for Golytely prep verses  alternative prep  ( Suprep/Plenvu)-  Pt is aware the Golytely has more volume but is more cost effective and the Suprep/Plenvu is less volume but may cost $90-150.  Pt voiced understanding of this and choose Suprep Prep.  Due to the COVID-19 pandemic we are asking patients to follow certain guidelines in PV and the [place]   Pt aware of COVID protocols and [facility] guidelines   PV completed over the phone. Pt verified name, DOB, address and insurance during PV today.  Pt mailed instruction packet with copy of consent form to read and not return, and instructions.  Pt encouraged to call with questions or issues.  If pt has My chart, procedure instructions sent via My Chart

## 2021-08-24 DIAGNOSIS — M5451 Vertebrogenic low back pain: Secondary | ICD-10-CM | POA: Diagnosis not present

## 2021-08-24 DIAGNOSIS — M546 Pain in thoracic spine: Secondary | ICD-10-CM | POA: Diagnosis not present

## 2021-09-04 ENCOUNTER — Encounter: Payer: Medicare Other | Admitting: Gastroenterology

## 2021-09-11 ENCOUNTER — Other Ambulatory Visit: Payer: Self-pay

## 2021-09-11 ENCOUNTER — Ambulatory Visit (AMBULATORY_SURGERY_CENTER): Payer: Medicare Other | Admitting: Gastroenterology

## 2021-09-11 ENCOUNTER — Encounter: Payer: Self-pay | Admitting: Gastroenterology

## 2021-09-11 VITALS — BP 119/72 | HR 56 | Temp 98.4°F | Resp 8 | Ht 73.0 in | Wt 220.0 lb

## 2021-09-11 DIAGNOSIS — Z1211 Encounter for screening for malignant neoplasm of colon: Secondary | ICD-10-CM | POA: Diagnosis not present

## 2021-09-11 DIAGNOSIS — I129 Hypertensive chronic kidney disease with stage 1 through stage 4 chronic kidney disease, or unspecified chronic kidney disease: Secondary | ICD-10-CM | POA: Diagnosis not present

## 2021-09-11 DIAGNOSIS — Z800 Family history of malignant neoplasm of digestive organs: Secondary | ICD-10-CM | POA: Diagnosis not present

## 2021-09-11 DIAGNOSIS — N183 Chronic kidney disease, stage 3 unspecified: Secondary | ICD-10-CM | POA: Diagnosis not present

## 2021-09-11 MED ORDER — SODIUM CHLORIDE 0.9 % IV SOLN: 500.0000 mL | Freq: Once | INTRAVENOUS | Status: DC

## 2021-09-11 NOTE — Patient Instructions (Signed)
Thank you for allowing [place] to care for you today. ?Resume previous diet and medications today. ?Return to normal activities tomorrow. ?May consider screening colonoscopy in 5 years.  We will send a reminder. ? ? ?YOU HAD AN ENDOSCOPIC PROCEDURE TODAY AT [facility]:   Refer to the procedure report that was given to you for any specific questions about what was found during the examination.  If the procedure report does not answer your questions, please call your gastroenterologist to clarify.  If you requested that your care partner not be given the details of your procedure findings, then the procedure report has been included in a sealed envelope for you to review at your convenience later. ? ?YOU SHOULD EXPECT: Some feelings of bloating in the abdomen. Passage of more gas than usual.  Walking can help get rid of the air that was put into your GI tract during the procedure and reduce the bloating. If you had a lower endoscopy (such as a colonoscopy or flexible sigmoidoscopy) you may notice spotting of blood in your stool or on the toilet paper. If you underwent a bowel prep for your procedure, you may not have a normal bowel movement for a few days. ? ?Please Note:  You might notice some irritation and congestion in your nose or some drainage.  This is from the oxygen used during your procedure.  There is no need for concern and it should clear up in a day or so. ? ?SYMPTOMS TO REPORT IMMEDIATELY: ? ?Following lower endoscopy (colonoscopy or flexible sigmoidoscopy): ? Excessive amounts of blood in the stool ? Significant tenderness or worsening of abdominal pains ? Swelling of the abdomen that is new, acute ? Fever of 100?F or higher ? ? ? ?For urgent or emergent issues, a gastroenterologist can be reached at any hour by calling [phone]. ?Do not use MyChart messaging for urgent concerns.  ? ? ?DIET:  We do recommend a small meal at first, but then you may proceed to your regular diet.  Drink  plenty of fluids but you should avoid alcoholic beverages for 24 hours. ? ?ACTIVITY:  You should plan to take it easy for the rest of today and you should NOT DRIVE or use heavy machinery until tomorrow (because of the sedation medicines used during the test).   ? ?FOLLOW UP: ?Our staff will call the number listed on your records 48-72 hours following your procedure to check on you and address any questions or concerns that you may have regarding the information given to you following your procedure. If we do not reach you, we will leave a message.  We will attempt to reach you two times.  During this call, we will ask if you have developed any symptoms of COVID 19. If you develop any symptoms (ie: fever, flu-like symptoms, shortness of breath, cough etc.) before then, please call [phone].  If you test positive for Covid 19 in the 2 weeks post procedure, please call and report this information to [place].   ? ?If any biopsies were taken you will be contacted by phone or by letter within the next 1-3 weeks.  Please call us at [phone] if you have not heard about the biopsies in 3 weeks.  ? ? ?SIGNATURES/CONFIDENTIALITY: ?You and/or your care partner have signed paperwork which will be entered into your electronic medical record.  These signatures attest to the fact that that the information above on your After Visit Summary has been reviewed and  is understood.  Full responsibility of the confidentiality of this discharge information lies with you and/or your care-partner.  ?

## 2021-09-11 NOTE — Op Note (Signed)
[facility] ?Patient Name: Marcus Crawford ?Procedure Date: 09/11/2021 7:15 AM ?MRN: [id number] ?Endoscopist: Ladene Artist , MD ?Age: 71 ?Referring MD:  ?Date of Birth: 1951/06/19 ?Gender: Male ?Account #: [id number] ?Procedure:                Colonoscopy ?Indications:              Screening in patient at increased risk: Family  ?                          history of 1st-degree relative with colorectal  ?                          cancer ?Medicines:                Monitored Anesthesia Care ?Procedure:                Pre-Anesthesia Assessment: ?                          - Prior to the procedure, a History and Physical  ?                          was performed, and patient medications and  ?                          allergies were reviewed. The patient's tolerance of  ?                          previous anesthesia was also reviewed. The risks  ?                          and benefits of the procedure and the sedation  ?                          options and risks were discussed with the patient.  ?                          All questions were answered, and informed consent  ?                          was obtained. Prior Anticoagulants: The patient has  ?                          taken no previous anticoagulant or antiplatelet  ?                          agents. ASA Grade Assessment: II - A patient with  ?                          mild systemic disease. After reviewing the risks  ?                          and benefits, the patient was deemed in  ?  satisfactory condition to undergo the procedure. ?                          After obtaining informed consent, the colonoscope  ?                          was passed under direct vision. Throughout the  ?                          procedure, the patient's blood pressure, pulse, and  ?                          oxygen saturations were monitored continuously. The  ?                          CF [id number] #[id number] was introduced through the anus  ?                           and advanced to the the cecum, identified by  ?                          appendiceal orifice and ileocecal valve. The  ?                          ileocecal valve, appendiceal orifice, and rectum  ?                          were photographed. The quality of the bowel  ?                          preparation was good. The colonoscopy was performed  ?                          without difficulty. The patient tolerated the  ?                          procedure well. ?Scope In: 8:06:01 AM ?Scope Out: 8:21:20 AM ?Scope Withdrawal Time: 0 hours 11 minutes 16 seconds  ?Total Procedure Duration: 0 hours 15 minutes 19 seconds  ?Findings:                 The perianal and digital rectal examinations were  ?                          normal. ?                          Internal hemorrhoids were found during  ?                          retroflexion. The hemorrhoids were small and Grade  ?                          I (internal hemorrhoids that do not prolapse). ?  The exam was otherwise without abnormality on  ?                          direct and retroflexion views. ?Complications:            No immediate complications. Estimated blood loss:  ?                          None. ?Estimated Blood Loss:     Estimated blood loss: none. ?Impression:               - Internal hemorrhoids. ?                          - The examination was otherwise normal on direct  ?                          and retroflexion views. ?                          - No specimens collected. ?Recommendation:           - Consider repeat colonoscopy in 5 years for  ?                          screening purposes vs no repeat due to age, absence  ?                          of polyps. ?                          - Patient has a contact number available for  ?                          emergencies. The signs and symptoms of potential  ?                          delayed complications were discussed with the  ?                           patient. Return to normal activities tomorrow.  ?                          Written discharge instructions were provided to the  ?                          patient. ?                          - Resume previous diet. ?                          - Continue present medications. ?Ladene Artist, MD ?09/11/2021 8:25:55 AM ?This report has been signed electronically. ?

## 2021-09-11 NOTE — Progress Notes (Signed)
Report given to PACU, vss 

## 2021-09-11 NOTE — Progress Notes (Signed)
VS-CW  Pt's states no medical or surgical changes since previsit or office visit.  

## 2021-09-11 NOTE — Progress Notes (Signed)
? ?History & Physical ? ?Primary Care Physician:  Janith Lima, MD ?Primary Gastroenterologist: Lucio Edward, MD ? ?CHIEF COMPLAINT:  [place]  ? ?HPI: Marcus Crawford is a 71 y.o. male with family history of colon cancer in first-degree relative for colonoscopy. ? ? ?Past Medical History:  ?Diagnosis Date  ? Anemia   ? early 20's  ? Bell's palsy 1977  ? Blood clot in vein 2005  ? left leg "blood clot"-treated as OP; no residual  ? Blood transfusion without reported diagnosis   ? with appendectomy  ? Borderline diabetic   ? no meds  ? Cancer ([place]) 08/2013  ? prostate/ had surgery/ no chemo or radiation  ? Cataract   ? forming  ? CKD (chronic kidney disease) stage 3, GFR 30-59 ml/min ([facility]) saw dr webb 6 months ago  ? on rampiril for kidney function also  ? Colonic polyp   ? X3 ,hyperplastic  ? Diverticulosis of colon   ? GERD (gastroesophageal reflux disease)   ? H/O hiatal hernia   ? Hemorrhoid   ? Hyperlipidemia   ? Hypertension   ? past hx - no meds since 2013 with  adrenal gland surgery  ? Kidney stone on left side 2013  ? asymptomatic , incidental finding  ? Migraine last 6-7 yrs ago  ? PMH of ; resolved post adrenal adenoma resection  ? Nephrolithiasis 2013  ? kidney stone  as incidental finding on imaging  ? PONV (postoperative nausea and vomiting)   ? ? ?Past Surgical History:  ?Procedure Laterality Date  ? adrenal venous sampling  03-2012  ? ADRENALECTOMY  04/14/2012  ? Procedure: ADRENALECTOMY;  Surgeon: Ralene Ok, MD;  Location: [facility];  Service: General;  Laterality: Left;  Left Adrenal Excision  ? APPENDECTOMY  1985  ? COLONOSCOPY    ? last 9/12, Dr Fuller Plan; due 2017  ? ESOPHAGEAL DILATION  2001  ? Dr Velora Heckler  ? POLYPECTOMY    ?  X 3  ? ROBOT ASSISTED LAPAROSCOPIC RADICAL PROSTATECTOMY N/A 09/07/2013  ? Procedure: ROBOTIC ASSISTED LAPAROSCOPIC RADICAL PROSTATECTOMY LEVEL 1;  Surgeon: Dutch Gray, MD;  Location: [facility];  Service: Urology;  Laterality: N/A;  ? ? ?Prior to Admission medications    ?Medication Sig Start Date End Date Taking? Authorizing Provider  ?cholecalciferol (VITAMIN D) 1000 UNITS tablet Take 1,000 Units by mouth daily.    Yes [provider]  ?pravastatin (PRAVACHOL) 10 MG tablet TAKE 1 TABLET(10 MG) BY MOUTH DAILY 02/25/21  Yes Janith Lima, MD  ?RABEprazole (ACIPHEX) 20 MG tablet Take 1 tablet (20 mg total) by mouth daily. ?Patient taking differently: Take 20 mg by mouth every other day. 10/24/20  Yes Janith Lima, MD  ?levocetirizine (XYZAL) 5 MG tablet TAKE 1 TABLET(5 MG) BY MOUTH EVERY EVENING ?Patient not taking: Reported on 08/21/2021 07/31/21   Janith Lima, MD  ?tadalafil (CIALIS) 20 MG tablet TAKE ONE TABLET BY MOUTH ONE TIME DAILY AS NEEDED AS DIRECTED 01/05/19   [provider]  ? ? ?Current Outpatient Medications  ?Medication Sig Dispense Refill  ? cholecalciferol (VITAMIN D) 1000 UNITS tablet Take 1,000 Units by mouth daily.     ? pravastatin (PRAVACHOL) 10 MG tablet TAKE 1 TABLET(10 MG) BY MOUTH DAILY 90 tablet 1  ? RABEprazole (ACIPHEX) 20 MG tablet Take 1 tablet (20 mg total) by mouth daily. (Patient taking differently: Take 20 mg by mouth every other day.) 90 tablet 1  ? levocetirizine (XYZAL) 5 MG tablet  TAKE 1 TABLET(5 MG) BY MOUTH EVERY EVENING (Patient not taking: Reported on 08/21/2021) 90 tablet 1  ? tadalafil (CIALIS) 20 MG tablet TAKE ONE TABLET BY MOUTH ONE TIME DAILY AS NEEDED AS DIRECTED    ? ?Current Facility-Administered Medications  ?Medication Dose Route Frequency Provider Last Rate Last Admin  ? 0.9 %  sodium chloride infusion  500 mL Intravenous Once Ladene Artist, MD      ? ? ?Allergies as of 09/11/2021 - Review Complete 09/11/2021  ?Allergen Reaction Noted  ? Penicillins    ? Sulfa antibiotics Rash 02/27/2012  ? ? ?Family History  ?Problem Relation Age of Onset  ? Rectal cancer Mother   ? Colon cancer Mother 20  ? Heart attack Mother 3  ? Colon polyps Sister   ? Lung cancer Sister   ?     smoker  ? Diabetes Sister   ? Breast  cancer Sister   ? Alzheimer's disease Sister   ? Heart failure Brother   ? Kidney cancer Brother   ? Prostate cancer Brother 10  ? Heart disease Brother   ? Hypertension Brother   ? Kidney cancer Brother   ? Diabetes Brother   ? Heart attack Brother 17  ? Esophageal cancer Maternal Uncle   ? Kidney cancer Maternal Uncle   ? Stomach cancer Neg Hx   ? ? ?Social History  ? ?Socioeconomic History  ? Marital status: Married  ?  Spouse name: Not on file  ? Number of children: 0  ? Years of education: Not on file  ? Highest education level: Not on file  ?Occupational History  ? Occupation: retired  ?Tobacco Use  ? Smoking status: Never  ? Smokeless tobacco: Never  ?Vaping Use  ? Vaping Use: Never used  ?Substance and Sexual Activity  ? Alcohol use: Yes  ?  Comment: 1 every 3 mos  ? Drug use: No  ? Sexual activity: Yes  ?Other Topics Concern  ? Not on file  ?Social History Narrative  ? Not on file  ? ?Social Determinants of Health  ? ?Financial Resource Strain: Not on file  ?Food Insecurity: Not on file  ?Transportation Needs: Not on file  ?Physical Activity: Not on file  ?Stress: Not on file  ?Social Connections: Not on file  ?Intimate Partner Violence: Not on file  ? ? ?Review of Systems: ? ?All systems reviewed an negative except where noted in HPI. ? ?Gen: Denies any fever, chills, sweats, anorexia, fatigue, weakness, malaise, weight loss, and sleep disorder ?CV: Denies chest pain, angina, palpitations, syncope, orthopnea, PND, peripheral edema, and claudication. ?Resp: Denies dyspnea at rest, dyspnea with exercise, cough, sputum, wheezing, coughing up blood, and pleurisy. ?GI: Denies vomiting blood, jaundice, and fecal incontinence.   Denies dysphagia or odynophagia. ?GU : Denies urinary burning, blood in urine, urinary frequency, urinary hesitancy, nocturnal urination, and urinary incontinence. ?MS: Denies joint pain, limitation of movement, and swelling, stiffness, low back pain, extremity pain. Denies muscle  weakness, cramps, atrophy.  ?Derm: Denies rash, itching, dry skin, hives, moles, warts, or unhealing ulcers.  ?Psych: Denies depression, anxiety, memory loss, suicidal ideation, hallucinations, paranoia, and confusion. ?Heme: Denies bruising, bleeding, and enlarged lymph nodes. ?Neuro:  Denies any headaches, dizziness, paresthesias. ?Endo:  Denies any problems with DM, thyroid, adrenal function. ? ? ?Physical Exam: ?General:  Alert, well-developed, in NAD ?Head:  Normocephalic and atraumatic. ?Eyes:  Sclera clear, no icterus.   Conjunctiva pink. ?Ears:  Normal auditory acuity. ?Mouth:  No  deformity or lesions.  ?Neck:  Supple; no masses . ?Lungs:  Clear throughout to auscultation.   No wheezes, crackles, or rhonchi. No acute distress. ?Heart:  Regular rate and rhythm; no murmurs. ?Abdomen:  Soft, nondistended, nontender. No masses, hepatomegaly. No obvious masses.  Normal bowel .    ?Rectal:  Deferred   ?Msk:  Symmetrical without gross deformities.Marland Kitchen ?Pulses:  Normal pulses noted. ?Extremities:  Without edema. ?Neurologic:  Alert and  oriented x4;  grossly normal neurologically. ?Skin:  Intact without significant lesions or rashes. ?Cervical Nodes:  No significant cervical adenopathy. ?Psych:  Alert and cooperative. Normal mood and affect. ? ? ?Impression / Plan:  ? ?Family history of colon cancer in first-degree relative for colonoscopy. ? ?Jaiceon Collister T. Fuller Plan  09/11/2021, 8:03 AM ?See Shea Evans,  GI, to contact our on call provider ? ? ?  ?

## 2021-09-13 ENCOUNTER — Telehealth: Payer: Self-pay | Admitting: *Deleted

## 2021-09-13 NOTE — Telephone Encounter (Signed)
?  Follow up Call- ? ?Call back number 09/11/2021  ?Post procedure Call Back phone  # [phone]  ?Permission to leave phone message Yes  ?Some recent data might be hidden  ?  ? ?Patient questions: ? ?Do you have a fever, pain , or abdominal swelling? No. ?Pain Score  0 * ? ?Have you tolerated food without any problems? Yes.   ? ?Have you been able to return to your normal activities? Yes.   ? ?Do you have any questions about your discharge instructions: ?Diet   No. ?Medications  No. ?Follow up visit  No. ? ?Do you have questions or concerns about your Care? No. ? ?Actions: ?* If pain score is 4 or above: ?No action needed, pain <4. ? ?Have you developed a fever since your procedure? no ? ?2.   Have you had an respiratory symptoms (SOB or cough) since your procedure? no ? ?3.   Have you tested positive for COVID 19 since your procedure no ? ?4.   Have you had any family members/close contacts diagnosed with the COVID 19 since your procedure?  no ? ? ?If yes to any of these questions please route to Joylene John, RN and Joella Prince, RN ? ? ? ?

## 2021-10-11 LAB — PSA: PSA: 0

## 2021-10-15 ENCOUNTER — Other Ambulatory Visit: Payer: Self-pay | Admitting: Internal Medicine

## 2021-10-15 DIAGNOSIS — K2100 Gastro-esophageal reflux disease with esophagitis, without bleeding: Secondary | ICD-10-CM

## 2021-10-18 DIAGNOSIS — Z8546 Personal history of malignant neoplasm of prostate: Secondary | ICD-10-CM | POA: Diagnosis not present

## 2021-10-18 DIAGNOSIS — R8271 Bacteriuria: Secondary | ICD-10-CM | POA: Diagnosis not present

## 2021-10-18 DIAGNOSIS — N5201 Erectile dysfunction due to arterial insufficiency: Secondary | ICD-10-CM | POA: Diagnosis not present

## 2021-10-30 ENCOUNTER — Ambulatory Visit (INDEPENDENT_AMBULATORY_CARE_PROVIDER_SITE_OTHER): Payer: Medicare Other | Admitting: Internal Medicine

## 2021-10-30 ENCOUNTER — Encounter: Payer: Self-pay | Admitting: Internal Medicine

## 2021-10-30 VITALS — BP 138/86 | HR 64 | Temp 98.0°F | Ht 73.0 in | Wt 219.0 lb

## 2021-10-30 DIAGNOSIS — I10 Essential (primary) hypertension: Secondary | ICD-10-CM

## 2021-10-30 DIAGNOSIS — N1831 Chronic kidney disease, stage 3a: Secondary | ICD-10-CM

## 2021-10-30 DIAGNOSIS — R7303 Prediabetes: Secondary | ICD-10-CM

## 2021-10-30 DIAGNOSIS — R209 Unspecified disturbances of skin sensation: Secondary | ICD-10-CM | POA: Insufficient documentation

## 2021-10-30 LAB — URINALYSIS, ROUTINE W REFLEX MICROSCOPIC
Bilirubin Urine: NEGATIVE
Hgb urine dipstick: NEGATIVE
Ketones, ur: NEGATIVE
Leukocytes,Ua: NEGATIVE
Nitrite: NEGATIVE
RBC / HPF: NONE SEEN (ref 0–?)
Specific Gravity, Urine: 1.02 (ref 1.000–1.030)
Total Protein, Urine: NEGATIVE
Urine Glucose: NEGATIVE
Urobilinogen, UA: 0.2 (ref 0.0–1.0)
pH: 6.5 (ref 5.0–8.0)

## 2021-10-30 LAB — HEMOGLOBIN A1C: Hgb A1c MFr Bld: 6 % (ref 4.6–6.5)

## 2021-10-30 LAB — BASIC METABOLIC PANEL
BUN: 23 mg/dL (ref 6–23)
CO2: 27 mEq/L (ref 19–32)
Calcium: 9.2 mg/dL (ref 8.4–10.5)
Chloride: 107 mEq/L (ref 96–112)
Creatinine, Ser: 1.4 mg/dL (ref 0.40–1.50)
GFR: 49.97 mL/min — ABNORMAL LOW (ref >60.00)
Glucose, Bld: 105 mg/dL — ABNORMAL HIGH (ref 70–99)
Potassium: 4.5 mEq/L (ref 3.5–5.1)
Sodium: 140 mEq/L (ref 135–145)

## 2021-10-30 NOTE — Progress Notes (Signed)
? ?Subjective:  ?Patient ID: Marcus Crawford, male    DOB: Dec 14, 1950  Age: 71 y.o. MRN: [id number] ? ?CC: Gastroesophageal Reflux and Hypertension ? ? ?HPI ?[name] presents for f/up - ? ?He complains that his hands and feet feel cold.  He does not describe claudication.  He is active and denies chest pain, shortness of breath, diaphoresis, dizziness, lightheadedness, or edema. ? ?Outpatient Medications Prior to Visit  ?Medication Sig Dispense Refill  ? cholecalciferol (VITAMIN D) 1000 UNITS tablet Take 1,000 Units by mouth daily.     ? levocetirizine (XYZAL) 5 MG tablet TAKE 1 TABLET(5 MG) BY MOUTH EVERY EVENING 90 tablet 1  ? pravastatin (PRAVACHOL) 10 MG tablet TAKE 1 TABLET(10 MG) BY MOUTH DAILY 90 tablet 1  ? RABEprazole (ACIPHEX) 20 MG tablet TAKE ONE TABLET BY MOUTH ONE TIME DAILY (Patient taking differently: every other day.) 90 tablet 1  ? tadalafil (CIALIS) 20 MG tablet TAKE ONE TABLET BY MOUTH ONE TIME DAILY AS NEEDED AS DIRECTED    ? ?No facility-administered medications prior to visit.  ? ? ?ROS ?Review of Systems  ?Constitutional:  Negative for chills, diaphoresis, fatigue and unexpected weight change.  ?HENT: Negative.    ?Eyes: Negative.   ?Respiratory:  Negative for cough, chest tightness, shortness of breath and wheezing.   ?Cardiovascular:  Negative for chest pain, palpitations and leg swelling.  ?Gastrointestinal:  Negative for abdominal pain, constipation, diarrhea, nausea and vomiting.  ?Endocrine: Negative.   ?Genitourinary: Negative.  Negative for difficulty urinating.  ?Musculoskeletal: Negative.  Negative for myalgias.  ?Skin: Negative.   ?Neurological:  Negative for dizziness, weakness, light-headedness and headaches.  ?Hematological:  Negative for adenopathy. Does not bruise/bleed easily.  ?Psychiatric/Behavioral: Negative.    ? ?Objective:  ?BP 138/86 (BP Location: Left Arm, Patient Position: Sitting, Cuff Size: Large)   Pulse 64   Temp 98 ?F (36.7 ?C) (Oral)   Ht '6\' 1"'$  (1.854  m)   Wt 219 lb (99.3 kg)   SpO2 97%   BMI 28.89 kg/m?  ? ?BP Readings from Last 3 Encounters:  ?10/30/21 138/86  ?09/11/21 119/72  ?05/02/21 112/76  ? ? ?Wt Readings from Last 3 Encounters:  ?10/30/21 219 lb (99.3 kg)  ?09/11/21 220 lb (99.8 kg)  ?08/21/21 220 lb (99.8 kg)  ? ? ?Physical Exam ?Vitals reviewed.  ?HENT:  ?   Nose: Nose normal.  ?   Mouth/Throat:  ?   Mouth: Mucous membranes are moist.  ?Eyes:  ?   General: No scleral icterus. ?   Conjunctiva/sclera: Conjunctivae normal.  ?Cardiovascular:  ?   Rate and Rhythm: Normal rate and regular rhythm.  ?   Pulses:     ?     Carotid pulses are 1+ on the right side and 1+ on the left side. ?     Radial pulses are 1+ on the right side and 1+ on the left side.  ?     Femoral pulses are 1+ on the right side and 1+ on the left side. ?     Popliteal pulses are 0 on the right side and 0 on the left side.  ?     Dorsalis pedis pulses are 0 on the right side and 0 on the left side.  ?     Posterior tibial pulses are 1+ on the right side and 1+ on the left side.  ?   Heart sounds: No murmur heard. ?Pulmonary:  ?   Breath sounds: No stridor. No  wheezing, rhonchi or rales.  ?Abdominal:  ?   General: Abdomen is flat.  ?   Palpations: There is no mass.  ?   Tenderness: There is no abdominal tenderness. There is no guarding.  ?   Hernia: No hernia is present.  ?Musculoskeletal:     ?   General: Normal range of motion.  ?   Cervical back: Neck supple.  ?   Right lower leg: No edema.  ?   Left lower leg: No edema.  ?Lymphadenopathy:  ?   Cervical: No cervical adenopathy.  ?Skin: ?   General: Skin is warm and dry.  ?Neurological:  ?   General: No focal deficit present.  ?   Mental Status: He is alert.  ?Psychiatric:     ?   Mood and Affect: Mood normal.     ?   Behavior: Behavior normal.  ? ? ?Lab Results  ?Component Value Date  ? WBC 4.5 01/30/2021  ? HGB 14.9 01/30/2021  ? HCT 45.0 01/30/2021  ? PLT 152.0 01/30/2021  ? GLUCOSE 105 (H) 10/30/2021  ? CHOL 145 05/02/2021  ? TRIG  234.0 (H) 05/02/2021  ? HDL 37.20 (L) 05/02/2021  ? LDLDIRECT 80.0 05/02/2021  ? LDLCALC 115 (H) 01/26/2020  ? ALT 18 01/30/2021  ? AST 9 01/30/2021  ? NA 140 10/30/2021  ? K 4.5 10/30/2021  ? CL 107 10/30/2021  ? CREATININE 1.40 10/30/2021  ? BUN 23 10/30/2021  ? CO2 27 10/30/2021  ? TSH 2.74 01/30/2021  ? PSA 0.0 10/11/2021  ? INR 1.12 02/28/2012  ? HGBA1C 6.0 10/30/2021  ? MICROALBUR 0.4 10/14/2008  ? ? ?MR Brain Wo Contrast ? ?Result Date: 01/19/2016 ?CLINICAL DATA:  71 year old hypertensive male with pain above the left ear and left jaw extending to back of head which started 3 months ago. No injury. Prostate cancer post prostatectomy (2015). Initial encounter. EXAM: MRI HEAD WITHOUT CONTRAST TECHNIQUE: Multiplanar, multiecho pulse sequences of the brain and surrounding structures were obtained without intravenous contrast. COMPARISON:  None. FINDINGS: No acute infarct or intracranial hemorrhage. Very mild chronic microvascular changes. Mild global atrophy without hydrocephalus. No intracranial mass lesion noted on this unenhanced exam. Small right vertebral artery may end in a posterior inferior cerebellar artery distribution. Left vertebral artery and basilar artery are ectatic. Slight impression left lateral medulla. No compression of the cisternal aspect of the fifth cranial nerve noted. Minimal partial opacification inferior left mastoid air cells. Mild polypoid opacification inferior left maxillary sinus. Degenerative changes C1 occipital and C1-C2 articulation. No acute orbital abnormality. IMPRESSION: No acute infarct or intracranial hemorrhage. Very mild chronic microvascular changes. Mild global atrophy without hydrocephalus. No intracranial mass lesion noted on this unenhanced exam. Small right vertebral artery may end in a posterior inferior cerebellar artery distribution. Left vertebral artery and basilar artery are ectatic. Slight impression left lateral medulla. No compression of the cisternal  aspect of the fifth cranial nerve noted. Minimal partial opacification inferior left mastoid air cells. Mild polypoid opacification inferior left maxillary sinus. Degenerative changes C1 occipital and C1-C2 articulation. Electronically Signed   By: Genia Del M.D.   On: 01/19/2016 08:35  ? ? ?Assessment & Plan:  ? ?Trentyn was seen today for gastroesophageal reflux and hypertension. ? ?Diagnoses and all orders for this visit: ? ?Essential hypertension- His blood pressure is adequately well controlled. ?-     Basic metabolic panel; Future ?-     Urinalysis, Routine w reflex microscopic; Future ?-  Urinalysis, Routine w reflex microscopic ?-     Basic metabolic panel ? ?Stage 3a chronic kidney disease ([place])- His renal function is stable.  He is avoiding nephrotoxic agents. ?-     Basic metabolic panel; Future ?-     Urinalysis, Routine w reflex microscopic; Future ?-     Urinalysis, Routine w reflex microscopic ?-     Basic metabolic panel ? ?Prediabetes ?-     Basic metabolic panel; Future ?-     Hemoglobin A1c; Future ?-     Hemoglobin A1c ?-     Basic metabolic panel ? ?Bilateral cold feet- Will evaluate for PAD. ?-     VAS [place] ABI WITH/WO TBI; Future ? ? ?I am having Jeneen Rinks T. Saine "[name]" maintain his cholecalciferol, tadalafil, pravastatin, levocetirizine, and RABEprazole. ? ?No orders of the defined types were placed in this encounter. ? ? ? ?Follow-up: Return in about 6 months (around 05/02/2022). ? ?Scarlette Calico, MD ?

## 2021-10-30 NOTE — Patient Instructions (Signed)
Hypertension, Adult High blood pressure (hypertension) is when the force of blood pumping through the arteries is too strong. The arteries are the blood vessels that carry blood from the heart throughout the body. Hypertension forces the heart to work harder to pump blood and may cause arteries to become narrow or stiff. Untreated or uncontrolled hypertension can lead to a heart attack, heart failure, a stroke, kidney disease, and other problems. A blood pressure reading consists of a higher number over a lower number. Ideally, your blood pressure should be below 120/80. The first ("top") number is called the systolic pressure. It is a measure of the pressure in your arteries as your heart beats. The second ("bottom") number is called the diastolic pressure. It is a measure of the pressure in your arteries as the heart relaxes. What are the causes? The exact cause of this condition is not known. There are some conditions that result in high blood pressure. What increases the risk? Certain factors may make you more likely to develop high blood pressure. Some of these risk factors are under your control, including: Smoking. Not getting enough exercise or physical activity. Being overweight. Having too much fat, sugar, calories, or salt (sodium) in your diet. Drinking too much alcohol. Other risk factors include: Having a personal history of heart disease, diabetes, high cholesterol, or kidney disease. Stress. Having a family history of high blood pressure and high cholesterol. Having obstructive sleep apnea. Age. The risk increases with age. What are the signs or symptoms? High blood pressure may not cause symptoms. Very high blood pressure (hypertensive crisis) may cause: Headache. Fast or irregular heartbeats (palpitations). Shortness of breath. Nosebleed. Nausea and vomiting. Vision changes. Severe chest pain, dizziness, and seizures. How is this diagnosed? This condition is diagnosed by  measuring your blood pressure while you are seated, with your arm resting on a flat surface, your legs uncrossed, and your feet flat on the floor. The cuff of the blood pressure monitor will be placed directly against the skin of your upper arm at the level of your heart. Blood pressure should be measured at least twice using the same arm. Certain conditions can cause a difference in blood pressure between your right and left arms. If you have a high blood pressure reading during one visit or you have normal blood pressure with other risk factors, you may be asked to: Return on a different day to have your blood pressure checked again. Monitor your blood pressure at home for 1 week or longer. If you are diagnosed with hypertension, you may have other blood or imaging tests to help your health care provider understand your overall risk for other conditions. How is this treated? This condition is treated by making healthy lifestyle changes, such as eating healthy foods, exercising more, and reducing your alcohol intake. You may be referred for counseling on a healthy diet and physical activity. Your health care provider may prescribe medicine if lifestyle changes are not enough to get your blood pressure under control and if: Your systolic blood pressure is above 130. Your diastolic blood pressure is above 80. Your personal target blood pressure may vary depending on your medical conditions, your age, and other factors. Follow these instructions at home: Eating and drinking  Eat a diet that is high in fiber and potassium, and low in sodium, added sugar, and fat. An example of this eating plan is called the DASH diet. DASH stands for Dietary Approaches to Stop Hypertension. To eat this way: Eat   plenty of fresh fruits and vegetables. Try to fill one half of your plate at each meal with fruits and vegetables. Eat whole grains, such as whole-wheat pasta, brown rice, or whole-grain bread. Fill about one  fourth of your plate with whole grains. Eat or drink low-fat dairy products, such as skim milk or low-fat yogurt. Avoid fatty cuts of meat, processed or cured meats, and poultry with skin. Fill about one fourth of your plate with lean proteins, such as fish, chicken without skin, beans, eggs, or tofu. Avoid pre-made and processed foods. These tend to be higher in sodium, added sugar, and fat. Reduce your daily sodium intake. Many people with hypertension should eat less than 1,500 mg of sodium a day. Do not drink alcohol if: Your health care provider tells you not to drink. You are pregnant, may be pregnant, or are planning to become pregnant. If you drink alcohol: Limit how much you have to: 0-1 drink a day for women. 0-2 drinks a day for men. Know how much alcohol is in your drink. In the [place] one drink equals one 12 oz bottle of beer (355 mL), one 5 oz glass of wine (148 mL), or one 1 oz glass of hard liquor (44 mL). Lifestyle  Work with your health care provider to maintain a healthy body weight or to lose weight. Ask what an ideal weight is for you. Get at least 30 minutes of exercise that causes your heart to beat faster (aerobic exercise) most days of the week. Activities may include walking, swimming, or biking. Include exercise to strengthen your muscles (resistance exercise), such as Pilates or lifting weights, as part of your weekly exercise routine. Try to do these types of exercises for 30 minutes at least 3 days a week. Do not use any products that contain nicotine or tobacco. These products include cigarettes, chewing tobacco, and vaping devices, such as e-cigarettes. If you need help quitting, ask your health care provider. Monitor your blood pressure at home as told by your health care provider. Keep all follow-up visits. This is important. Medicines Take over-the-counter and prescription medicines only as told by your health care provider. Follow directions carefully. Blood  pressure medicines must be taken as prescribed. Do not skip doses of blood pressure medicine. Doing this puts you at risk for problems and can make the medicine less effective. Ask your health care provider about side effects or reactions to medicines that you should watch for. Contact a health care provider if you: Think you are having a reaction to a medicine you are taking. Have headaches that keep coming back (recurring). Feel dizzy. Have swelling in your ankles. Have trouble with your vision. Get help right away if you: Develop a severe headache or confusion. Have unusual weakness or numbness. Feel faint. Have severe pain in your chest or abdomen. Vomit repeatedly. Have trouble breathing. These symptoms may be an emergency. Get help right away. Call 911. Do not wait to see if the symptoms will go away. Do not drive yourself to the hospital. Summary Hypertension is when the force of blood pumping through your arteries is too strong. If this condition is not controlled, it may put you at risk for serious complications. Your personal target blood pressure may vary depending on your medical conditions, your age, and other factors. For most people, a normal blood pressure is less than 120/80. Hypertension is treated with lifestyle changes, medicines, or a combination of both. Lifestyle changes include losing weight, eating a healthy,   low-sodium diet, exercising more, and limiting alcohol. This information is not intended to replace advice given to you by your health care provider. Make sure you discuss any questions you have with your health care provider. Document Revised: 04/25/2021 Document Reviewed: 04/25/2021 Elsevier Patient Education  2023 Elsevier Inc.  

## 2021-11-30 ENCOUNTER — Telehealth: Payer: Self-pay | Admitting: Internal Medicine

## 2021-11-30 ENCOUNTER — Ambulatory Visit (HOSPITAL_COMMUNITY)
Admission: RE | Admit: 2021-11-30 | Discharge: 2021-11-30 | Disposition: A | Discharge status: Home / Self Care | Payer: Medicare Other | Source: Ambulatory Visit | Attending: Internal Medicine | Admitting: Internal Medicine

## 2021-11-30 DIAGNOSIS — R209 Unspecified disturbances of skin sensation: Secondary | ICD-10-CM | POA: Insufficient documentation

## 2021-11-30 NOTE — Telephone Encounter (Signed)
Patient needs his prevastatin 10 mg. Refilled - please send this to [facility] - patient still has 5 or 6 but will need it soon.

## 2021-12-01 ENCOUNTER — Other Ambulatory Visit: Payer: Self-pay | Admitting: Internal Medicine

## 2021-12-01 DIAGNOSIS — E785 Hyperlipidemia, unspecified: Secondary | ICD-10-CM

## 2021-12-01 MED ORDER — PRAVASTATIN SODIUM 10 MG PO TABS: ORAL_TABLET | ORAL | 1 refills | Status: DC

## 2021-12-04 DIAGNOSIS — H524 Presbyopia: Secondary | ICD-10-CM | POA: Diagnosis not present

## 2021-12-04 DIAGNOSIS — H04123 Dry eye syndrome of bilateral lacrimal glands: Secondary | ICD-10-CM | POA: Diagnosis not present

## 2021-12-04 DIAGNOSIS — H2513 Age-related nuclear cataract, bilateral: Secondary | ICD-10-CM | POA: Diagnosis not present

## 2021-12-06 ENCOUNTER — Ambulatory Visit (INDEPENDENT_AMBULATORY_CARE_PROVIDER_SITE_OTHER): Payer: Medicare Other | Admitting: Dermatology

## 2021-12-06 ENCOUNTER — Encounter: Payer: Self-pay | Admitting: Dermatology

## 2021-12-06 DIAGNOSIS — L821 Other seborrheic keratosis: Secondary | ICD-10-CM | POA: Diagnosis not present

## 2021-12-06 DIAGNOSIS — Z1283 Encounter for screening for malignant neoplasm of skin: Secondary | ICD-10-CM

## 2021-12-06 DIAGNOSIS — L720 Epidermal cyst: Secondary | ICD-10-CM

## 2021-12-06 DIAGNOSIS — D1801 Hemangioma of skin and subcutaneous tissue: Secondary | ICD-10-CM | POA: Diagnosis not present

## 2021-12-06 DIAGNOSIS — D1800 Hemangioma unspecified site: Secondary | ICD-10-CM

## 2021-12-06 NOTE — Progress Notes (Unsigned)
   Follow-Up Visit   Subjective  Marcus Crawford is a 71 y.o. male who presents for the following: Annual Exam (No new concerns).  *** Location:  Duration:  Quality:  Associated Signs/Symptoms: Modifying Factors:  Severity:  Timing: Context:   Objective  Well appearing patient in no apparent distress; mood and affect are within normal limits.   {[id number]::"E full examination was performed including scalp, head, eyes, ears, nose, lips, neck, chest, axillae, abdomen, back, buttocks, bilateral upper extremities, bilateral lower extremities, hands, feet, fingers, toes, fingernails, and toenails. All findings within normal limits unless otherwise noted below."}   Assessment & Plan   Multiple seborrheic keratoses including the right lower Achilles area, right cheek, left temple, and torso.  Multiple angiomata including a darker purple lesion with normal dermoscopy on the left upper abdomen.  A small open cyst that might have been secondary to surgery on the right outer abdomen.     I, Lavonna Monarch, MD, have reviewed all documentation for this visit.  The documentation on 12/06/21 for the exam, diagnosis, procedures, and orders are all accurate and complete.

## 2021-12-25 ENCOUNTER — Encounter: Payer: Self-pay | Admitting: Dermatology

## 2022-01-04 ENCOUNTER — Ambulatory Visit (INDEPENDENT_AMBULATORY_CARE_PROVIDER_SITE_OTHER): Payer: Medicare Other

## 2022-01-04 DIAGNOSIS — Z0000 Encounter for general adult medical examination without abnormal findings: Secondary | ICD-10-CM

## 2022-01-04 NOTE — Progress Notes (Signed)
I connected with Marcus Crawford today by telephone and verified that I am speaking with the correct person using two identifiers. Location patient: home Location provider: work Persons participating in the virtual visit: patient, provider.   I discussed the limitations, risks, security and privacy concerns of performing an evaluation and management service by telephone and the availability of in person appointments. I also discussed with the patient that there may be a patient responsible charge related to this service. The patient expressed understanding and verbally consented to this telephonic visit.    Interactive audio and video telecommunications were attempted between this provider and patient, however failed, due to patient having technical difficulties OR patient did not have access to video capability.  We continued and completed visit with audio only.  Some vital signs may be absent or patient reported.   Time Spent with patient on telephone encounter: 30 minutes  Subjective:   Marcus Crawford is a 71 y.o. male who presents for Medicare Annual/Subsequent preventive examination.  Review of Systems     Cardiac Risk Factors include: advanced age (>55mn, >>37women);dyslipidemia;family history of premature cardiovascular disease;male gender     Objective:    There were no vitals filed for this visit. There is no height or weight on file to calculate BMI.     01/04/2022    3:04 PM 06/18/2019    9:09 AM 11/06/2017    9:48 AM 01/15/2016   12:46 PM 09/07/2013    6:03 PM 09/07/2013    9:02 AM 09/01/2013    9:40 AM  Advanced Directives  Does Patient Have a Medical Advance Directive? Yes Yes Yes Yes Patient has advance directive, copy in chart Patient has advance directive, copy in chart Patient has advance directive, copy in chart  Type of Advance Directive Living will;Healthcare Power of [place] will Healthcare Power of [place] will  Does patient want to make changes to medical advance directive? No - Patient declined   No - Patient declined No change requested  No change requested  Copy of [place] Chart? No - copy requested No - copy requested No - copy requested Yes     Pre-existing out of facility DNR order (yellow form or pink MOST form)       No    Current Medications (verified) Outpatient Encounter Medications as of 01/04/2022  Medication Sig   cholecalciferol (VITAMIN D) 1000 UNITS tablet Take 1,000 Units by mouth daily.    levocetirizine (XYZAL) 5 MG tablet TAKE 1 TABLET(5 MG) BY MOUTH EVERY EVENING   pravastatin (PRAVACHOL) 10 MG tablet TAKE 1 TABLET(10 MG) BY MOUTH DAILY   RABEprazole (ACIPHEX) 20 MG tablet TAKE ONE TABLET BY MOUTH ONE TIME DAILY (Patient taking differently: every other day.)   tadalafil (CIALIS) 20 MG tablet TAKE ONE TABLET BY MOUTH ONE TIME DAILY AS NEEDED AS DIRECTED   No facility-administered encounter medications on file as of 01/04/2022.    Allergies (verified) Penicillins and Sulfa antibiotics   History: Past Medical History:  Diagnosis Date   Anemia    early 20's   Bell's palsy 1977   Blood clot in vein 2005   left leg "blood clot"-treated as OP; no residual   Blood transfusion without reported diagnosis    with appendectomy   Borderline diabetic    no meds   [place]  prostate/ had surgery/ no chemo or radiation   Cataract    forming   CKD (chronic kidney disease) stage 3, GFR 30-59 ml/min (HCC) saw Marcus Crawford 6 months ago   on rampiril for kidney function also   Colonic polyp    X3 ,hyperplastic   Diverticulosis of colon    GERD (gastroesophageal reflux disease)    H/O hiatal hernia    Hemorrhoid    Hyperlipidemia    Hypertension    past hx - no meds since 2013 with  adrenal gland surgery   Kidney stone  on left side 2013   asymptomatic , incidental finding   Migraine last 6-7 yrs ago   PMH of ; resolved post adrenal adenoma resection   Nephrolithiasis 2013   kidney stone  as incidental finding on imaging   PONV (postoperative nausea and vomiting)    Past Surgical History:  Procedure Laterality Date   adrenal venous sampling  03-2012   ADRENALECTOMY  04/14/2012   Procedure: ADRENALECTOMY;  Surgeon: Marcus Ok, MD;  Location: [facility];  Service: General;  Laterality: Left;  Left Adrenal Excision   APPENDECTOMY  1985   COLONOSCOPY     last 9/12, Marcus Crawford; due 2017   ESOPHAGEAL DILATION  2001   Marcus Crawford   POLYPECTOMY      X 3   ROBOT ASSISTED LAPAROSCOPIC RADICAL PROSTATECTOMY N/A 09/07/2013   Procedure: ROBOTIC ASSISTED LAPAROSCOPIC RADICAL PROSTATECTOMY LEVEL 1;  Surgeon: Marcus Gray, MD;  Location: [facility];  Service: Urology;  Laterality: N/A;   Family History  Problem Relation Age of Onset   Rectal cancer Mother    Colon cancer Mother 48   Heart attack Mother 52   Colon polyps Sister    Lung cancer Sister        smoker   Diabetes Sister    Breast cancer Sister    Alzheimer's disease Sister    Heart failure Brother    Kidney cancer Brother    Prostate cancer Brother 67   Heart disease Brother    Hypertension Brother    Kidney cancer Brother    Diabetes Brother    Heart attack Brother 84   Esophageal cancer Maternal Uncle    Kidney cancer Maternal Uncle    Stomach cancer Neg Hx    Social History   Socioeconomic History   Marital status: Married    Spouse name: Not on file   Number of children: 0   Years of education: Not on file   Highest education level: Not on file  Occupational History   Occupation: retired  Tobacco Use   Smoking status: Never   Smokeless tobacco: Never  Vaping Use   Vaping Use: Never used  Substance and Sexual Activity   Alcohol use: Yes    Comment: 1 every 3 mos   Drug use: No   Sexual activity: Yes  Other Topics Concern   Not on  file  Social History Narrative   Not on file   Social Determinants of Health   Financial Resource Strain: Low Risk  (01/04/2022)   Overall Financial Resource Strain (CARDIA)    Difficulty of Paying Living Expenses: Not hard at all  Food Insecurity: No Food Insecurity (01/04/2022)   Hunger Vital Sign    Worried About Running Out of Food in the Last Year: Never true    Ran Out of Food in the Last Year: Never true  Transportation Needs: No Transportation Needs (01/04/2022)   PRAPARE - Transportation  Lack of Transportation (Medical): No    Lack of Transportation (Non-Medical): No  Physical Activity: Sufficiently Active (01/04/2022)   Exercise Vital Sign    Days of Exercise per Week: 5 days    Minutes of Exercise per Session: 30 min  Stress: No Stress Concern Present (01/04/2022)   Marcus Crawford    Feeling of Stress : Not at all  Social Connections: [place] (01/04/2022)   Social Connection and Isolation Panel [NHANES]    Frequency of Communication with Friends and Family: More than three times a week    Frequency of Social Gatherings with Friends and Family: Not on file    Attends Religious Services: 1 to 4 times per year    Active Member of [employer] or Organizations: Yes    Attends Archivist Meetings: 1 to 4 times per year    Marital Status: Married    Tobacco Counseling Counseling given: Not Answered   Clinical Intake:  Pre-visit preparation completed: Yes  Pain : No/denies pain     BMI - recorded: 28.9 Nutritional Status: BMI 25 -29 Overweight Diabetes: No  What is the last grade level you completed in school?: 2 years of college  Diabetic? no     Information entered by :: Wanna Gully N. Hatfiled, LPN.   Activities of Daily Living    01/04/2022    3:26 PM 01/30/2021   10:30 AM  In your present state of health, do you have any difficulty performing the following activities:  Hearing? 0 0   Vision? 0 0  Difficulty concentrating or making decisions? 0 0  Walking or climbing stairs? 0 0  Dressing or bathing? 0 0  Doing errands, shopping? 0 0  Preparing Food and eating ? N   Using the Toilet? N   In the past six months, have you accidently leaked urine? N   Do you have problems with loss of bowel control? N   Managing your Medications? N   Managing your Finances? N   Housekeeping or managing your Housekeeping? N     Patient Care Team: Janith Lima, MD as PCP - General (Internal Medicine) Raynelle Bring, MD as Consulting Physician (Urology) Charlton Haws, [facility] as Pharmacist (Pharmacist) Lavonna Monarch, MD as Consulting Physician (Dermatology) Jola Schmidt, MD as Consulting Physician (Ophthalmology)  Indicate any recent Medical Services you may have received from other than [facility] providers in the past year (date may be approximate).     Assessment:   This is a routine wellness examination for Marcus Crawford.  Hearing/Vision screen Hearing Screening - Comments:: Patient denied any hearing difficulty.   No hearing aids.   Vision Screening - Comments:: Patient does wear corrective lenses/contacts.  Eye exam done by: Jola Schmidt, MD.   Dietary issues and exercise activities discussed: Current Exercise Habits: Home exercise routine, Type of exercise: walking, Time (Minutes): 30, Frequency (Times/Week): 5, Weekly Exercise (Minutes/Week): 150, Intensity: Moderate, Exercise limited by: None identified   Goals Addressed             This Visit's Progress    My goal is to feel better due to low energy/fatigue.        Depression Screen    01/04/2022    3:07 PM 01/30/2021   10:31 AM 06/18/2019    9:08 AM 04/28/2019    8:38 AM 01/19/2019    2:25 PM 11/06/2017    9:48 AM 01/15/2016   12:46 PM  PHQ 2/9 Scores  PHQ - 2 Score 0 0 0 0 0 0 0  PHQ- 9 Score     0 0     Fall Risk    01/04/2022    3:07 PM 01/30/2021   10:30 AM 06/18/2019    9:08 AM 04/28/2019    8:38 AM  01/19/2019    2:25 PM  Fall Risk   Falls in the past year? 0 0 0 0 0  Number falls in past yr: 0  0 0 0  Injury with Fall? 0  0 0 0  Risk for fall due to : No Fall Risks      Follow up Falls evaluation completed   Falls evaluation completed Falls evaluation completed    [place]:  Any stairs in or around the home? No  If so, are there any without handrails? No  Home free of loose throw rugs in walkways, pet beds, electrical cords, etc? Yes  Adequate lighting in your home to reduce risk of falls? Yes   ASSISTIVE DEVICES UTILIZED TO PREVENT FALLS:  Life alert? No  Use of a cane, walker or w/c? No  Grab bars in the bathroom? No  Shower chair or bench in shower? Yes  Elevated toilet seat or a handicapped toilet? Yes   TIMED UP AND GO:  Was the test performed? No .  Length of time to ambulate 10 feet: n/a sec.   Appearance of gait: Gait not evaluated during this visit.  Cognitive Function:        01/04/2022    3:27 PM  6CIT Screen  What Year? 0 points  What month? 0 points  What time? 0 points  Count back from 20 0 points  Months in reverse 0 points  Repeat phrase 0 points  Total Score 0 points    Immunizations Immunization History  Administered Date(s) Administered   Fluad Quad(high Dose 65+) 03/17/2019, 04/12/2020, 04/07/2021   Influenza Split 04/30/2011, 05/20/2012   Influenza Whole 04/07/2009, 03/31/2010   Influenza, High Dose Seasonal PF 04/17/2016, 04/16/2017, 04/21/2018   Influenza,inj,Quad PF,6+ Mos 04/08/2013, 04/13/2014, 04/21/2015   PFIZER Comirnaty(Crawford Top)Covid-19 Tri-Sucrose Vaccine 11/29/2020   PFIZER(Purple Top)SARS-COV-2 Vaccination 08/15/2019, 09/07/2019, 05/17/2020   Pfizer Covid-19 Vaccine Bivalent Booster 5yr & up 04/28/2021   Pneumococcal Conjugate-13 01/12/2016   Pneumococcal Polysaccharide-23 09/08/2013, 10/27/2019   Tdap 09/13/2011   Zoster, Live 10/05/2008    TDAP status: Due, Education has been  provided regarding the importance of this vaccine. Advised may receive this vaccine at local pharmacy or Health Dept. Aware to provide a copy of the vaccination record if obtained from local pharmacy or Health Dept. Verbalized acceptance and understanding.  Flu Vaccine status: Up to date  Pneumococcal vaccine status: Up to date  Covid-19 vaccine status: Completed vaccines  Qualifies for Shingles Vaccine? Yes   Zostavax completed Yes   Shingrix Completed?: No.    Education has been provided regarding the importance of this vaccine. Patient has been advised to call insurance company to determine out of pocket expense if they have not yet received this vaccine. Advised may also receive vaccine at local pharmacy or Health Dept. Verbalized acceptance and understanding.  Screening Tests Health Maintenance  Topic Date Due   Zoster Vaccines- Shingrix (1 of 2) Never done   TETANUS/TDAP  09/12/2021   INFLUENZA VACCINE  01/30/2022   COLONOSCOPY (Pts [phone] coverage will need to be confirmed)  09/12/2026   Pneumonia Vaccine 6573Years old  Completed  COVID-19 Vaccine  Completed   Hepatitis C Screening  Completed   HPV VACCINES  Aged Out    Health Maintenance  Health Maintenance Due  Topic Date Due   Zoster Vaccines- Shingrix (1 of 2) Never done   TETANUS/TDAP  09/12/2021    Colorectal cancer screening: Type of screening: Colonoscopy. Completed 09/11/2021. Repeat every 5 years  Lung Cancer Screening: (Low Dose CT Chest recommended if Age 53-80 years, 30 pack-year currently smoking OR have quit w/in 15years.) does not qualify.   Lung Cancer Screening Referral: no  Additional Screening:  Hepatitis C Screening: does qualify; Completed 06/04/2016  Vision Screening: Recommended annual ophthalmology exams for early detection of glaucoma and other disorders of the eye. Is the patient up to date with their annual eye exam?  Yes  Who is the provider or what is the name of the office  in which the patient attends annual eye exams? Jola Schmidt, MD. If pt is not established with a provider, would they like to be referred to a provider to establish care? No .   Dental Screening: Recommended annual dental exams for proper oral hygiene  Community Resource Referral / Chronic Care Management: CRR required this visit?  No   CCM required this visit?  No      Crawford:     I have personally reviewed and noted the following in the patient's chart:   Medical and social history Use of alcohol, tobacco or illicit drugs  Current medications and supplements including opioid prescriptions. Patient is not currently taking opioid prescriptions. Functional ability and status Nutritional status Physical activity Advanced directives List of other physicians Hospitalizations, surgeries, and ER visits in previous 12 months Vitals Screenings to include cognitive, depression, and falls Referrals and appointments  In addition, I have reviewed and discussed with patient certain preventive protocols, quality metrics, and best practice recommendations. A written personalized care Crawford for preventive services as well as general preventive health recommendations were provided to patient.     Sheral Flow, LPN   0/0/7622   Nurse Notes:  Patient is cogitatively intact. There were no vitals filed for this visit. There is no height or weight on file to calculate BMI. Patient stated that he has no issues with gait or balance; does not use any assistive devices.

## 2022-01-04 NOTE — Patient Instructions (Signed)
Marcus Crawford , Thank you for taking time to come for your Medicare Wellness Visit. I appreciate your ongoing commitment to your health goals. Please review the following plan we discussed and let me know if I can assist you in the future.   Screening recommendations/referrals: Colonoscopy: 09/11/2021; due every 5 years Recommended yearly ophthalmology/optometry visit for glaucoma screening and checkup Recommended yearly dental visit for hygiene and checkup  Vaccinations: Influenza vaccine: 04/07/2021 Pneumococcal vaccine: 01/12/2016, 10/27/2019 Tdap vaccine: 09/13/2011; due every 10 years (overdue) Shingles vaccine: never done   Covid-19: 08/15/2019, 09/07/2019, 05/17/2020, 11/29/2020, 04/28/2021  Advanced directives: Yes; Please bring a copy of your health care power of attorney and living will to the office at your convenience.  Conditions/risks identified: Yes  Next appointment: Please schedule your next Medicare Wellness Visit with your Nurse Health Advisor in 1 year by calling [phone].  Preventive Care 67 Years and Older, Male Preventive care refers to lifestyle choices and visits with your health care provider that can promote health and wellness. What does preventive care include? A yearly physical exam. This is also called an annual well check. Dental exams once or twice a year. Routine eye exams. Ask your health care provider how often you should have your eyes checked. Personal lifestyle choices, including: Daily care of your teeth and gums. Regular physical activity. Eating a healthy diet. Avoiding tobacco and drug use. Limiting alcohol use. Practicing safe sex. Taking low doses of aspirin every day. Taking vitamin and mineral supplements as recommended by your health care provider. What happens during an annual well check? The services and screenings done by your health care provider during your annual well check will depend on your age, overall health, lifestyle risk  factors, and family history of disease. Counseling  Your health care provider may ask you questions about your: Alcohol use. Tobacco use. Drug use. Emotional well-being. Home and relationship well-being. Sexual activity. Eating habits. History of falls. Memory and ability to understand (cognition). Work and work Statistician. Screening  You may have the following tests or measurements: Height, weight, and BMI. Blood pressure. Lipid and cholesterol levels. These may be checked every 5 years, or more frequently if you are over 3 years old. Skin check. Lung cancer screening. You may have this screening every year starting at age 51 if you have a 30-pack-year history of smoking and currently smoke or have quit within the past 15 years. Fecal occult blood test (FOBT) of the stool. You may have this test every year starting at age 17. Flexible sigmoidoscopy or colonoscopy. You may have a sigmoidoscopy every 5 years or a colonoscopy every 10 years starting at age 12. Prostate cancer screening. Recommendations will vary depending on your family history and other risks. Hepatitis C blood test. Hepatitis B blood test. Sexually transmitted disease (STD) testing. Diabetes screening. This is done by checking your blood sugar (glucose) after you have not eaten for a while (fasting). You may have this done every 1-3 years. Abdominal aortic aneurysm (AAA) screening. You may need this if you are a current or former smoker. Osteoporosis. You may be screened starting at age 66 if you are at high risk. Talk with your health care provider about your test results, treatment options, and if necessary, the need for more tests. Vaccines  Your health care provider may recommend certain vaccines, such as: Influenza vaccine. This is recommended every year. Tetanus, diphtheria, and acellular pertussis (Tdap, Td) vaccine. You may need a Td booster every 10 years. Zoster vaccine.  You may need this after age  5. Pneumococcal 13-valent conjugate (PCV13) vaccine. One dose is recommended after age 38. Pneumococcal polysaccharide (PPSV23) vaccine. One dose is recommended after age 93. Talk to your health care provider about which screenings and vaccines you need and how often you need them. This information is not intended to replace advice given to you by your health care provider. Make sure you discuss any questions you have with your health care provider. Document Released: 07/15/2015 Document Revised: 03/07/2016 Document Reviewed: 04/19/2015 Elsevier Interactive Patient Education  2017 [employer] [employer] Prevention in the Home Falls can cause injuries. They can happen to people of all ages. There are many things you can do to make your home safe and to help prevent falls. What can I do on the outside of my home? Regularly fix the edges of walkways and driveways and fix any cracks. Remove anything that might make you trip as you walk through a door, such as a raised step or threshold. Trim any bushes or trees on the path to your home. Use bright outdoor lighting. Clear any walking paths of anything that might make someone trip, such as rocks or tools. Regularly check to see if handrails are loose or broken. Make sure that both sides of any steps have handrails. Any raised decks and porches should have guardrails on the edges. Have any leaves, snow, or ice cleared regularly. Use sand or salt on walking paths during winter. Clean up any spills in your garage right away. This includes oil or grease spills. What can I do in the bathroom? Use night lights. Install grab bars by the toilet and in the tub and shower. Do not use towel bars as grab bars. Use non-skid mats or decals in the tub or shower. If you need to sit down in the shower, use a plastic, non-slip stool. Keep the floor dry. Clean up any water that spills on the floor as soon as it happens. Remove soap buildup in the tub or shower  regularly. Attach bath mats securely with double-sided non-slip rug tape. Do not have throw rugs and other things on the floor that can make you trip. What can I do in the bedroom? Use night lights. Make sure that you have a light by your bed that is easy to reach. Do not use any sheets or blankets that are too big for your bed. They should not hang down onto the floor. Have a firm chair that has side arms. You can use this for support while you get dressed. Do not have throw rugs and other things on the floor that can make you trip. What can I do in the kitchen? Clean up any spills right away. Avoid walking on wet floors. Keep items that you use a lot in easy-to-reach places. If you need to reach something above you, use a strong step stool that has a grab bar. Keep electrical cords out of the way. Do not use floor polish or wax that makes floors slippery. If you must use wax, use non-skid floor wax. Do not have throw rugs and other things on the floor that can make you trip. What can I do with my stairs? Do not leave any items on the stairs. Make sure that there are handrails on both sides of the stairs and use them. Fix handrails that are broken or loose. Make sure that handrails are as long as the stairways. Check any carpeting to make sure that it is  firmly attached to the stairs. Fix any carpet that is loose or worn. Avoid having throw rugs at the top or bottom of the stairs. If you do have throw rugs, attach them to the floor with carpet tape. Make sure that you have a light switch at the top of the stairs and the bottom of the stairs. If you do not have them, ask someone to add them for you. What else can I do to help prevent falls? Wear shoes that: Do not have high heels. Have rubber bottoms. Are comfortable and fit you well. Are closed at the toe. Do not wear sandals. If you use a stepladder: Make sure that it is fully opened. Do not climb a closed stepladder. Make sure that  both sides of the stepladder are locked into place. Ask someone to hold it for you, if possible. Clearly mark and make sure that you can see: Any grab bars or handrails. First and last steps. Where the edge of each step is. Use tools that help you move around (mobility aids) if they are needed. These include: Canes. Walkers. Scooters. Crutches. Turn on the lights when you go into a dark area. Replace any light bulbs as soon as they burn out. Set up your furniture so you have a clear path. Avoid moving your furniture around. If any of your floors are uneven, fix them. If there are any pets around you, be aware of where they are. Review your medicines with your doctor. Some medicines can make you feel dizzy. This can increase your chance of falling. Ask your doctor what other things that you can do to help prevent falls. This information is not intended to replace advice given to you by your health care provider. Make sure you discuss any questions you have with your health care provider. Document Released: 04/14/2009 Document Revised: 11/24/2015 Document Reviewed: 07/23/2014 Elsevier Interactive Patient Education  2017 [employer].

## 2022-01-29 ENCOUNTER — Ambulatory Visit (INDEPENDENT_AMBULATORY_CARE_PROVIDER_SITE_OTHER): Payer: Medicare Other | Admitting: Internal Medicine

## 2022-01-29 ENCOUNTER — Encounter: Payer: Self-pay | Admitting: Internal Medicine

## 2022-01-29 VITALS — BP 122/74 | HR 77 | Resp 18 | Ht 73.0 in | Wt 216.6 lb

## 2022-01-29 DIAGNOSIS — R5383 Other fatigue: Secondary | ICD-10-CM

## 2022-01-29 DIAGNOSIS — E559 Vitamin D deficiency, unspecified: Secondary | ICD-10-CM | POA: Diagnosis not present

## 2022-01-29 LAB — COMPREHENSIVE METABOLIC PANEL
ALT: 16 U/L (ref 0–53)
AST: 9 U/L (ref 0–37)
Albumin: 4.3 g/dL (ref 3.5–5.2)
Alkaline Phosphatase: 72 U/L (ref 39–117)
BUN: 26 mg/dL — ABNORMAL HIGH (ref 6–23)
CO2: 29 mEq/L (ref 19–32)
Calcium: 9.7 mg/dL (ref 8.4–10.5)
Chloride: 103 mEq/L (ref 96–112)
Creatinine, Ser: 1.66 mg/dL — ABNORMAL HIGH (ref 0.40–1.50)
GFR: 40.66 mL/min — ABNORMAL LOW (ref >60.00)
Glucose, Bld: 121 mg/dL — ABNORMAL HIGH (ref 70–99)
Potassium: 4.9 mEq/L (ref 3.5–5.1)
Sodium: 140 mEq/L (ref 135–145)
Total Bilirubin: 0.7 mg/dL (ref 0.2–1.2)
Total Protein: 6.8 g/dL (ref 6.0–8.3)

## 2022-01-29 LAB — CBC
HCT: 43.3 % (ref 39.0–52.0)
Hemoglobin: 14.8 g/dL (ref 13.0–17.0)
MCHC: 34.2 g/dL (ref 30.0–36.0)
MCV: 89.6 fl (ref 78.0–100.0)
Platelets: 166 10*3/uL (ref 150.0–400.0)
RBC: 4.83 Mil/uL (ref 4.22–5.81)
RDW: 13.5 % (ref 11.5–15.5)
WBC: 4.9 10*3/uL (ref 4.0–10.5)

## 2022-01-29 LAB — VITAMIN D 25 HYDROXY (VIT D DEFICIENCY, FRACTURES): VITD: 43.77 ng/mL (ref 30.00–100.00)

## 2022-01-29 LAB — TSH: TSH: 3.03 u[IU]/mL (ref 0.35–5.50)

## 2022-01-29 LAB — VITAMIN B12: Vitamin B-12: 409 pg/mL (ref 211–911)

## 2022-01-29 NOTE — Progress Notes (Signed)
   Subjective:   Patient ID: Marcus Crawford, male    DOB: 1951-03-29, 71 y.o.   MRN: [id number]  HPI The patient is a 71 YO man coming in for concerns about tiredness  Review of Systems  Constitutional:  Positive for fatigue.  HENT: Negative.    Eyes: Negative.   Respiratory:  Negative for cough, chest tightness and shortness of breath.   Cardiovascular:  Negative for chest pain, palpitations and leg swelling.  Gastrointestinal:  Negative for abdominal distention, abdominal pain, constipation, diarrhea, nausea and vomiting.  Musculoskeletal: Negative.   Skin: Negative.   Neurological: Negative.   Psychiatric/Behavioral: Negative.      Objective:  Physical Exam Constitutional:      Appearance: He is well-developed.  HENT:     Head: Normocephalic and atraumatic.  Cardiovascular:     Rate and Rhythm: Normal rate and regular rhythm.  Pulmonary:     Effort: Pulmonary effort is normal. No respiratory distress.     Breath sounds: Normal breath sounds. No wheezing or rales.  Abdominal:     General: Bowel sounds are normal. There is no distension.     Palpations: Abdomen is soft.     Tenderness: There is no abdominal tenderness. There is no rebound.  Musculoskeletal:     Cervical back: Normal range of motion.  Skin:    General: Skin is warm and dry.  Neurological:     Mental Status: He is alert and oriented to person, place, and time.     Coordination: Coordination normal.     Vitals:   01/29/22 0758  BP: 122/74  Pulse: 77  Resp: 18  SpO2: 96%  Weight: 216 lb 9.6 oz (98.2 kg)  Height: '6\' 1"'$  (1.854 m)    Assessment & Plan:

## 2022-01-29 NOTE — Patient Instructions (Signed)
We will check the labs today and if normal we will check a sleep study.

## 2022-01-29 NOTE — Assessment & Plan Note (Signed)
Checking vitamin D level, adjust as needed.

## 2022-01-29 NOTE — Assessment & Plan Note (Signed)
Going on at least a year with normal lab workup. He feels progressive in the last few months. Will recheck CBC, CMP, TSH, B12, vitamin D. Treat as appropriate. He does snore so if labs normal will order home sleep test.

## 2022-02-01 ENCOUNTER — Encounter: Payer: Self-pay | Admitting: Internal Medicine

## 2022-02-14 DIAGNOSIS — R5383 Other fatigue: Secondary | ICD-10-CM | POA: Diagnosis not present

## 2022-02-14 DIAGNOSIS — N183 Chronic kidney disease, stage 3 unspecified: Secondary | ICD-10-CM | POA: Diagnosis not present

## 2022-02-14 DIAGNOSIS — N2581 Secondary hyperparathyroidism of renal origin: Secondary | ICD-10-CM | POA: Diagnosis not present

## 2022-02-14 DIAGNOSIS — D3500 Benign neoplasm of unspecified adrenal gland: Secondary | ICD-10-CM | POA: Diagnosis not present

## 2022-02-14 DIAGNOSIS — Z8679 Personal history of other diseases of the circulatory system: Secondary | ICD-10-CM | POA: Diagnosis not present

## 2022-02-14 DIAGNOSIS — I129 Hypertensive chronic kidney disease with stage 1 through stage 4 chronic kidney disease, or unspecified chronic kidney disease: Secondary | ICD-10-CM | POA: Diagnosis not present

## 2022-02-19 NOTE — Telephone Encounter (Signed)
Pt states he is still fatigued. He said he has been trying to get some advice for almost a month now. Pt states Dr. Sharlet Salina told him his kidney function numbers don't show a reason for fatigue, so he said he is lost and doesn't know where to go from here. He said he is feeling worse everyday.    Please advise

## 2022-02-20 ENCOUNTER — Ambulatory Visit
Admission: EM | Admit: 2022-02-20 | Discharge: 2022-02-20 | Disposition: A | Discharge status: Home / Self Care | Payer: Medicare Other | Attending: Emergency Medicine | Admitting: Emergency Medicine

## 2022-02-20 DIAGNOSIS — R400 Somnolence: Secondary | ICD-10-CM

## 2022-02-20 DIAGNOSIS — R531 Weakness: Secondary | ICD-10-CM

## 2022-02-20 DIAGNOSIS — R7303 Prediabetes: Secondary | ICD-10-CM | POA: Diagnosis not present

## 2022-02-20 DIAGNOSIS — G479 Sleep disorder, unspecified: Secondary | ICD-10-CM | POA: Diagnosis not present

## 2022-02-20 DIAGNOSIS — R5382 Chronic fatigue, unspecified: Secondary | ICD-10-CM

## 2022-02-20 NOTE — Discharge Instructions (Signed)
As we discussed, I will make sure that you are notified of the results of your lab test that we performed during your visit today.  I will also ensure that Dr. Marcello Moores receives a copy for me as well.    I anticipate that the results of these labs will be available within a week, some take longer to complete than others.  If you use MyChart, you can certainly review them there.  If there are any abnormal findings that require immediate attention, you will receive a phone call from one of our staff here with recommendations.  Thank you for visiting urgent care today.

## 2022-02-20 NOTE — ED Provider Notes (Signed)
[facility]    CSN: [id number] Arrival date & time: 02/20/22  1044    HISTORY   Chief Complaint  Patient presents with   Fatigue   Weakness   HPI Marcus Crawford is a pleasant, 71 y.o. male who presents to urgent care today. Patient complains of progressively worsening weakness and fatigue for the past 3 months.  Patient states he was seen by his PCP for this recently but was told that all the lab work they performed was normal.  Patient states he is aware that he snores but his wife states is not loud and states she has never told him that he stops or pauses breathing.  Patient states he often falls asleep in front of the television however does not have trouble staying awake while driving or having conversations with other people.  Patient states he has been advised to have a sleep study but has not pursued this.  EMR reviewed, previous lab results reviewed with patient.  Patient is prediabetic.  Patient states he is normally an active person and has not been able to engage in any of his regular activities, particularly yard work which is normally something he enjoys doing.  The history is provided by the patient.   Past Medical History:  Diagnosis Date   Anemia    early 20's   Bell's palsy 1977   Blood clot in vein 2005   left leg "blood clot"-treated as OP; no residual   Blood transfusion without reported diagnosis    with appendectomy   Borderline diabetic    no meds   Cancer ([place]) 08/2013   prostate/ had surgery/ no chemo or radiation   Cataract    forming   CKD (chronic kidney disease) stage 3, GFR 30-59 ml/min (HCC) saw dr Justin Mend 6 months ago   on rampiril for kidney function also   Colonic polyp    X3 ,hyperplastic   Diverticulosis of colon    GERD (gastroesophageal reflux disease)    H/O hiatal hernia    Hemorrhoid    Hyperlipidemia    Hypertension    past hx - no meds since 2013 with  adrenal gland surgery   Kidney stone on left side 2013    asymptomatic , incidental finding   Migraine last 6-7 yrs ago   PMH of ; resolved post adrenal adenoma resection   Nephrolithiasis 2013   kidney stone  as incidental finding on imaging   PONV (postoperative nausea and vomiting)    Patient Active Problem List   Diagnosis Date Noted   Bilateral cold feet 10/30/2021   Thoracic back pain 08/24/2021   Other fatigue 01/30/2021   RBBB (right bundle branch block) 01/30/2021   Trochanteric bursitis, right hip 07/23/2019   Hyperlipidemia with target LDL less than 130 01/19/2019   Seasonal allergic rhinitis due to pollen 01/15/2018   Obesity, Class I, BMI 30-34.9 01/12/2016   Kidney disease, chronic, stage III (GFR 30-59 ml/min) ([place]) 01/12/2016   Prostate cancer ([place]) 07/30/2013   Vitamin D deficiency 06/15/2013   Urolithiasis 02/08/2012   Prediabetes 10/14/2008   Essential hypertension 09/15/2007   Esophageal reflux 12/06/2006   History of colonic polyps 12/06/2006   Past Surgical History:  Procedure Laterality Date   adrenal venous sampling  03-2012   ADRENALECTOMY  04/14/2012   Procedure: ADRENALECTOMY;  Surgeon: Ralene Ok, MD;  Location: [facility];  Service: General;  Laterality: Left;  Left Adrenal Excision   APPENDECTOMY  1985   COLONOSCOPY  last 9/12, Dr Fuller Plan; due 2017   ESOPHAGEAL DILATION  2001   Dr Velora Heckler   POLYPECTOMY      X 3   ROBOT ASSISTED LAPAROSCOPIC RADICAL PROSTATECTOMY N/A 09/07/2013   Procedure: ROBOTIC ASSISTED LAPAROSCOPIC RADICAL PROSTATECTOMY LEVEL 1;  Surgeon: Dutch Gray, MD;  Location: [facility];  Service: Urology;  Laterality: N/A;    Home Medications    Prior to Admission medications   Medication Sig Start Date End Date Taking? Authorizing Provider  cholecalciferol (VITAMIN D) 1000 UNITS tablet Take 1,000 Units by mouth daily.     [provider]  levocetirizine (XYZAL) 5 MG tablet TAKE 1 TABLET(5 MG) BY MOUTH EVERY EVENING 07/31/21   Janith Lima, MD  pravastatin (PRAVACHOL) 10 MG  tablet TAKE 1 TABLET(10 MG) BY MOUTH DAILY 12/01/21   Janith Lima, MD  RABEprazole (ACIPHEX) 20 MG tablet TAKE ONE TABLET BY MOUTH ONE TIME DAILY Patient taking differently: every other day. 10/15/21   Janith Lima, MD  tadalafil (CIALIS) 20 MG tablet TAKE ONE TABLET BY MOUTH ONE TIME DAILY AS NEEDED AS DIRECTED 01/05/19   [provider]    Family History Family History  Problem Relation Age of Onset   Rectal cancer Mother    Colon cancer Mother 36   Heart attack Mother 57   Colon polyps Sister    Lung cancer Sister        smoker   Diabetes Sister    Breast cancer Sister    Alzheimer's disease Sister    Heart failure Brother    Kidney cancer Brother    Prostate cancer Brother 68   Heart disease Brother    Hypertension Brother    Kidney cancer Brother    Diabetes Brother    Heart attack Brother 68   Esophageal cancer Maternal Uncle    Kidney cancer Maternal Uncle    Stomach cancer Neg Hx    Social History Social History   Tobacco Use   Smoking status: Never   Smokeless tobacco: Never  Vaping Use   Vaping Use: Never used  Substance Use Topics   Alcohol use: Yes    Comment: 1 every 3 mos   Drug use: No   Allergies   Penicillins and Sulfa antibiotics  Review of Systems Review of Systems Pertinent findings revealed after performing a 14 point review of systems has been noted in the history of present illness.  Physical Exam Triage Vital Signs ED Triage Vitals  Enc Vitals Group     BP 04/28/21 0827 (!) 147/82     Pulse Rate 04/28/21 0827 72     Resp 04/28/21 0827 18     Temp 04/28/21 0827 98.3 F (36.8 C)     Temp Source 04/28/21 0827 Oral     SpO2 04/28/21 0827 98 %     Weight --      Height --      Head Circumference --      Peak Flow --      Pain Score 04/28/21 0826 5     Pain Loc --      Pain Edu? --      Excl. in [place]? --   No data found.  Updated Vital Signs BP 116/83 (BP Location: Right Arm)   Pulse 74   Temp 98.4 F (36.9 C)  (Oral)   Resp 16   SpO2 96%   Physical Exam Vitals and nursing note reviewed.  Constitutional:  General: He is not in acute distress.    Appearance: Normal appearance. He is not ill-appearing.  HENT:     Head: Normocephalic and atraumatic.     Salivary Glands: Right salivary gland is not diffusely enlarged or tender. Left salivary gland is not diffusely enlarged or tender.     Right Ear: Tympanic membrane, ear canal and external ear normal. No drainage. No middle ear effusion. There is no impacted cerumen. Tympanic membrane is not erythematous or bulging.     Left Ear: Tympanic membrane, ear canal and external ear normal. No drainage.  No middle ear effusion. There is no impacted cerumen. Tympanic membrane is not erythematous or bulging.     Nose: Nose normal. No nasal deformity, septal deviation, mucosal edema, congestion or rhinorrhea.     Right Turbinates: Not enlarged, swollen or pale.     Left Turbinates: Not enlarged, swollen or pale.     Right Sinus: No maxillary sinus tenderness or frontal sinus tenderness.     Left Sinus: No maxillary sinus tenderness or frontal sinus tenderness.     Mouth/Throat:     Lips: Pink. No lesions.     Mouth: Mucous membranes are moist. No oral lesions.     Pharynx: Oropharynx is clear. Uvula midline. No posterior oropharyngeal erythema or uvula swelling.     Tonsils: No tonsillar exudate. 0 on the right. 0 on the left.  Eyes:     General: Lids are normal.        Right eye: No discharge.        Left eye: No discharge.     Extraocular Movements: Extraocular movements intact.     Conjunctiva/sclera: Conjunctivae normal.     Right eye: Right conjunctiva is not injected.     Left eye: Left conjunctiva is not injected.  Neck:     Trachea: Trachea and phonation normal.  Cardiovascular:     Rate and Rhythm: Normal rate and regular rhythm.     Pulses: Normal pulses.     Heart sounds: Normal heart sounds. No murmur heard.    No friction rub. No  gallop.  Pulmonary:     Effort: Pulmonary effort is normal. No accessory muscle usage, prolonged expiration or respiratory distress.     Breath sounds: Normal breath sounds. No stridor, decreased air movement or transmitted upper airway sounds. No decreased breath sounds, wheezing, rhonchi or rales.  Chest:     Chest wall: No tenderness.  Musculoskeletal:        General: Normal range of motion.     Cervical back: Normal range of motion and neck supple. Normal range of motion.  Lymphadenopathy:     Cervical: No cervical adenopathy.  Skin:    General: Skin is warm and dry.     Findings: No erythema or rash.  Neurological:     General: No focal deficit present.     Mental Status: He is alert and oriented to person, place, and time.  Psychiatric:        Mood and Affect: Mood normal.        Behavior: Behavior normal.     Visual Acuity Right Eye Distance:   Left Eye Distance:   Bilateral Distance:    Right Eye Near:   Left Eye Near:    Bilateral Near:     UC Couse / Diagnostics / Procedures:     Radiology No results found.  Procedures Procedures (including critical care time) EKG  Pending results:  Labs Reviewed  HEMOGLOBIN A1C - Abnormal; Notable for the following components:      Result Value   Hgb A1c MFr Bld 6.0 (*)    All other components within normal limits   Narrative:    Performed at:  [address] [place], [place]  [id number] Lab Director: Rush Farmer MD, Phone:  [phone]  PTH, INTACT AND CALCIUM   Narrative:    Performed at:  [facility] [address], [place], [place]  [id number] Lab Director: Rush Farmer MD, Phone:  [phone]  CALCIUM   Narrative:    Performed at:  [address] [place] [address] [place], [place]  [id number] Lab Director: Rush Farmer MD, Phone:  [phone]  SPECIMEN STATUS REPORT   Narrative:    Performed at:  [address] [place], [place]   [id number] Lab Director: Rush Farmer MD, Phone:  [phone]  TSH  T3, FREE  T4, FREE  PROLACTIN  TESTOSTERONE,FREE AND TOTAL    Medications Ordered in UC: Medications - No data to display  UC Diagnoses / Final Clinical Impressions(s)   I have reviewed the triage vital signs and the nursing notes.  Pertinent labs & imaging results that were available during my care of the patient were reviewed by me and considered in my medical decision making (see chart for details).    Final diagnoses:  Chronic fatigue  Weakness  Somnolence, daytime  Restless sleeper  Prediabetes   Patient advised to proceed with sleep study as has been recommended by his PCP.  We will perform further testing and notify patient as well as his PCP of results once received.  Patient advised that if there are any abnormal findings these will need to be addressed by his PCP.  Patient verbalized understanding.  At the time of writing this note, patient's A1c still very acceptable at 6.0 and had normal PTH and calcium levels.  Testosterone, prolactin and thyroid hormone results are still pending.  ED Prescriptions   None    PDMP not reviewed this encounter.  Pending results:  Labs Reviewed  HEMOGLOBIN A1C - Abnormal; Notable for the following components:      Result Value   Hgb A1c MFr Bld 6.0 (*)    All other components within normal limits   Narrative:    Performed at:  [address], [place], [place]  [id number] Lab Director: Rush Farmer MD, Phone:  [phone]  PTH, INTACT AND CALCIUM   Narrative:    Performed at:  [employer] [address], [place], [place]  [id number] Lab Director: Rush Farmer MD, Phone:  [phone]  CALCIUM   Narrative:    Performed at:  [address] [facility] [address] [place], [place]  [id number] Lab Director: Rush Farmer MD, Phone:  [phone]  SPECIMEN STATUS REPORT   Narrative:    Performed at:  [facility] [address] [place], [place]  [id number] Lab Director: Rush Farmer MD, Phone:  [phone]  TSH  T3, FREE  T4, FREE  PROLACTIN  TESTOSTERONE,FREE AND TOTAL    Discharge Instructions:   Discharge Instructions      As we discussed, I will make sure that you are notified of the results of your lab test that we performed during your visit today.  I will also ensure that Dr. Marcello Moores receives a copy for me as well.    I anticipate that the results of these labs will be  available within a week, some take longer to complete than others.  If you use MyChart, you can certainly review them there.  If there are any abnormal findings that require immediate attention, you will receive a phone call from one of our staff here with recommendations.  Thank you for visiting urgent care today.    Disposition Upon Discharge:  Condition: stable for discharge home  Patient presented with an acute illness with associated systemic symptoms and significant discomfort requiring urgent management. In my opinion, this is a condition that a prudent lay person (someone who possesses an average knowledge of health and medicine) may potentially expect to result in complications if not addressed urgently such as respiratory distress, impairment of bodily function or dysfunction of bodily organs.   Routine symptom specific, illness specific and/or disease specific instructions were discussed with the patient and/or caregiver at length.   As such, the patient has been evaluated and assessed, work-up was performed and treatment was provided in alignment with urgent care protocols and evidence based medicine.  Patient/parent/caregiver has been advised that the patient may require follow up for further testing and treatment if the symptoms continue in spite of treatment, as clinically indicated and appropriate.  Patient/parent/caregiver has been advised to return to the [facility] or PCP if no better; to PCP or  the Emergency Department if new signs and symptoms develop, or if the current signs or symptoms continue to change or worsen for further workup, evaluation and treatment as clinically indicated and appropriate  The patient will follow up with their current PCP if and as advised. If the patient does not currently have a PCP we will assist them in obtaining one.   The patient may need specialty follow up if the symptoms continue, in spite of conservative treatment and management, for further workup, evaluation, consultation and treatment as clinically indicated and appropriate.   Patient/parent/caregiver verbalized understanding and agreement of plan as discussed.  All questions were addressed during visit.  Please see discharge instructions below for further details of plan.  This office note has been dictated using Museum/gallery curator.  Unfortunately, this method of dictation can sometimes lead to typographical or grammatical errors.  I apologize for your inconvenience in advance if this occurs.  Please do not hesitate to reach out to me if clarification is needed.      Lynden Oxford Scales, PA-C 02/23/22 1225

## 2022-02-20 NOTE — ED Triage Notes (Signed)
The pt c/o weakness and fatigue for about 3 months. He states his PCP completed blood work previously but everything was normal.

## 2022-02-21 ENCOUNTER — Ambulatory Visit: Payer: Medicare Other | Admitting: Family Medicine

## 2022-02-21 LAB — PTH, INTACT AND CALCIUM

## 2022-02-21 LAB — HEMOGLOBIN A1C
Est. average glucose Bld gHb Est-mCnc: 126 mg/dL
Hgb A1c MFr Bld: 6 % — ABNORMAL HIGH (ref 4.8–5.6)

## 2022-02-21 NOTE — Telephone Encounter (Signed)
I was able to speak with the pt and he has stated he went to urgent care and was waiting on his lab results call from them to know if he needs to come in or not.  **The pt also stated he would like a call from Dr. Ronnald Ramp about the labs that were done to see what he thinks he needs to do next or if he needs to come in for a visit.

## 2022-02-22 LAB — SPECIMEN STATUS REPORT

## 2022-02-22 LAB — CALCIUM: Calcium: 9.7 mg/dL (ref 8.6–10.2)

## 2022-02-28 LAB — T4, FREE: Free T4: 1.24 ng/dL (ref 0.82–1.77)

## 2022-02-28 LAB — TSH: TSH: 2.4 u[IU]/mL (ref 0.450–4.500)

## 2022-02-28 LAB — TESTOSTERONE,FREE AND TOTAL
Testosterone, Free: 3.9 pg/mL — ABNORMAL LOW (ref 6.6–18.1)
Testosterone: 159 ng/dL — ABNORMAL LOW (ref 264–916)

## 2022-02-28 LAB — T3, FREE: T3, Free: 2.8 pg/mL (ref 2.0–4.4)

## 2022-02-28 LAB — PROLACTIN: Prolactin: 5.6 ng/mL (ref 4.0–15.2)

## 2022-03-01 ENCOUNTER — Ambulatory Visit (INDEPENDENT_AMBULATORY_CARE_PROVIDER_SITE_OTHER): Payer: Medicare Other | Admitting: Family Medicine

## 2022-03-01 ENCOUNTER — Encounter: Payer: Self-pay | Admitting: Family Medicine

## 2022-03-01 VITALS — BP 138/92 | HR 75 | Temp 97.8°F | Ht 73.0 in | Wt 214.0 lb

## 2022-03-01 DIAGNOSIS — Z9079 Acquired absence of other genital organ(s): Secondary | ICD-10-CM | POA: Diagnosis not present

## 2022-03-01 DIAGNOSIS — E291 Testicular hypofunction: Secondary | ICD-10-CM | POA: Diagnosis not present

## 2022-03-01 DIAGNOSIS — R5383 Other fatigue: Secondary | ICD-10-CM | POA: Diagnosis not present

## 2022-03-01 DIAGNOSIS — R400 Somnolence: Secondary | ICD-10-CM | POA: Diagnosis not present

## 2022-03-01 DIAGNOSIS — E349 Endocrine disorder, unspecified: Secondary | ICD-10-CM | POA: Diagnosis not present

## 2022-03-01 NOTE — Progress Notes (Signed)
Subjective:     Patient ID: Marcus Crawford, male    DOB: 02-05-1951, 71 y.o.   MRN: [id number]  Chief Complaint  Patient presents with   Fatigue    Has been going on for several months now, just had labs done at hospital and they told him his testosterone is low and more than likely the cause    HPI Patient is in today for follow up on fatigue and recent visit to urgent care. He had labs done there including testosterone which was low. Normal thyroid panel, prolactin, calcium and A1c was in prediabetes range.   States he expected and was told he could come in for a "shot" today to help with his fatigue.   States he is wasting his time here today because he is not being helped.    Denies fever, chills, dizziness, chest pain, palpitations, shortness of breath, abdominal pain, N/V/D, urinary symptoms, LE edema.    Dr. Alinda Money is his urologist  Hx of prostate cancer with prostate removed.   Saw his nephrologist in mid August fo CKD and renal function was improving.   He has been advised to have a sleep study but has not pursued this in the past.   Health Maintenance Due  Topic Date Due   Zoster Vaccines- Shingrix (1 of 2) Never done   TETANUS/TDAP  09/12/2021    Past Medical History:  Diagnosis Date   Anemia    early 20's   Bell's palsy 1977   Blood clot in vein 2005   left leg "blood clot"-treated as OP; no residual   Blood transfusion without reported diagnosis    with appendectomy   Borderline diabetic    no meds   Cancer ([place]) 08/2013   prostate/ had surgery/ no chemo or radiation   Cataract    forming   CKD (chronic kidney disease) stage 3, GFR 30-59 ml/min (HCC) saw dr Justin Mend 6 months ago   on rampiril for kidney function also   Colonic polyp    X3 ,hyperplastic   Diverticulosis of colon    GERD (gastroesophageal reflux disease)    H/O hiatal hernia    Hemorrhoid    Hyperlipidemia    Hypertension    past hx - no meds since 2013 with  adrenal gland surgery    Kidney stone on left side 2013   asymptomatic , incidental finding   Migraine last 6-7 yrs ago   PMH of ; resolved post adrenal adenoma resection   Nephrolithiasis 2013   kidney stone  as incidental finding on imaging   PONV (postoperative nausea and vomiting)     Past Surgical History:  Procedure Laterality Date   adrenal venous sampling  03-2012   ADRENALECTOMY  04/14/2012   Procedure: ADRENALECTOMY;  Surgeon: Ralene Ok, MD;  Location: [facility];  Service: General;  Laterality: Left;  Left Adrenal Excision   APPENDECTOMY  1985   COLONOSCOPY     last 9/12, Dr Fuller Plan; due 2017   ESOPHAGEAL DILATION  2001   Dr Velora Heckler   POLYPECTOMY      X 3   ROBOT ASSISTED LAPAROSCOPIC RADICAL PROSTATECTOMY N/A 09/07/2013   Procedure: ROBOTIC ASSISTED LAPAROSCOPIC RADICAL PROSTATECTOMY LEVEL 1;  Surgeon: Dutch Gray, MD;  Location: [facility];  Service: Urology;  Laterality: N/A;    Family History  Problem Relation Age of Onset   Rectal cancer Mother    Colon cancer Mother 67   Heart attack Mother 64   Colon polyps  Sister    Lung cancer Sister        smoker   Diabetes Sister    Breast cancer Sister    Alzheimer's disease Sister    Heart failure Brother    Kidney cancer Brother    Prostate cancer Brother 33   Heart disease Brother    Hypertension Brother    Kidney cancer Brother    Diabetes Brother    Heart attack Brother 21   Esophageal cancer Maternal Uncle    Kidney cancer Maternal Uncle    Stomach cancer Neg Hx     Social History   Socioeconomic History   Marital status: Married    Spouse name: Not on file   Number of children: 0   Years of education: Not on file   Highest education level: Not on file  Occupational History   Occupation: retired  Tobacco Use   Smoking status: Never   Smokeless tobacco: Never  Vaping Use   Vaping Use: Never used  Substance and Sexual Activity   Alcohol use: Yes    Comment: 1 every 3 mos   Drug use: No   Sexual activity: Yes  Other  Topics Concern   Not on file  Social History Narrative   Not on file   Social Determinants of Health   Financial Resource Strain: Low Risk  (01/04/2022)   Overall Financial Resource Strain (CARDIA)    Difficulty of Paying Living Expenses: Not hard at all  Food Insecurity: No Food Insecurity (01/04/2022)   Hunger Vital Sign    Worried About Running Out of Food in the Last Year: Never true    Ran Out of Food in the Last Year: Never true  Transportation Needs: No Transportation Needs (01/04/2022)   PRAPARE - Hydrologist (Medical): No    Lack of Transportation (Non-Medical): No  Physical Activity: Sufficiently Active (01/04/2022)   Exercise Vital Sign    Days of Exercise per Week: 5 days    Minutes of Exercise per Session: 30 min  Stress: No Stress Concern Present (01/04/2022)   [place]    Feeling of Stress : Not at all  Social Connections: [place] (01/04/2022)   Social Connection and Isolation Panel [NHANES]    Frequency of Communication with Friends and Family: More than three times a week    Frequency of Social Gatherings with Friends and Family: Not on file    Attends Religious Services: 1 to 4 times per year    Active Member of [employer] or Organizations: Yes    Attends Archivist Meetings: 1 to 4 times per year    Marital Status: Married  Human resources officer Violence: Not At Risk (01/04/2022)   Humiliation, Afraid, Rape, and Kick questionnaire    Fear of Current or Ex-Partner: No    Emotionally Abused: No    Physically Abused: No    Sexually Abused: No    Outpatient Medications Prior to Visit  Medication Sig Dispense Refill   cholecalciferol (VITAMIN D) 1000 UNITS tablet Take 1,000 Units by mouth daily.      levocetirizine (XYZAL) 5 MG tablet TAKE 1 TABLET(5 MG) BY MOUTH EVERY EVENING 90 tablet 1   pravastatin (PRAVACHOL) 10 MG tablet TAKE 1 TABLET(10 MG) BY MOUTH DAILY 90  tablet 1   RABEprazole (ACIPHEX) 20 MG tablet TAKE ONE TABLET BY MOUTH ONE TIME DAILY (Patient taking differently: every other day.) 90 tablet 1  tadalafil (CIALIS) 20 MG tablet TAKE ONE TABLET BY MOUTH ONE TIME DAILY AS NEEDED AS DIRECTED     No facility-administered medications prior to visit.    Allergies  Allergen Reactions   Penicillins     Rash (RN clarified with pt) No SOB/swelling   Sulfa Antibiotics Rash   Aspirin Other (See Comments)    ROS Pertinent positives and negatives in the history of present illness.     Objective:    Physical Exam Constitutional:      General: He is not in acute distress. Cardiovascular:     Rate and Rhythm: Normal rate.  Pulmonary:     Effort: Pulmonary effort is normal.  Neurological:     General: No focal deficit present.     Mental Status: He is alert and oriented to person, place, and time.  Psychiatric:        Mood and Affect: Affect is angry.        Speech: Speech normal.        Behavior: Behavior is agitated.     BP (!) 138/92 (BP Location: Left Arm, Patient Position: Sitting, Cuff Size: Large)   Pulse 75   Temp 97.8 F (36.6 C) (Temporal)   Ht '6\' 1"'$  (1.854 m)   Wt 214 lb (97.1 kg)   SpO2 97%   BMI 28.23 kg/m  Wt Readings from Last 3 Encounters:  03/01/22 214 lb (97.1 kg)  01/29/22 216 lb 9.6 oz (98.2 kg)  10/30/21 219 lb (99.3 kg)       Assessment & Plan:   Problem List Items Addressed This Visit       Other   Other fatigue - Primary   Relevant Orders   Home sleep test   Other Visit Diagnoses     Hypogonadism in male       History of prostatectomy       Daytime somnolence       Relevant Orders   Home sleep test      Reviewed notes from urgent care visit and Dr. Nathanial Millman visit for fatigue.  Discussed that he may benefit from sleep test, he is willing to do this. HST ordered.  Testosterone level is low. He will need repeat labs to confirm this.  Discussed that I cannot start him on testosterone  today and he expressed his dissatisfaction. States he feels like the visit is a Theatre manager of time".  Discussed that Dr. Ronnald Ramp, his PCP, may feel comfortable proceeding with testosterone replacement and he may follow up with him to discuss this or see his urologist Dr. Alinda Money.     I am having Marcus Crawford "Marcus Crawford" maintain his cholecalciferol, tadalafil, levocetirizine, RABEprazole, and pravastatin.  No orders of the defined types were placed in this encounter.

## 2022-03-01 NOTE — Telephone Encounter (Signed)
Pt has OV today with Harland Dingwall, NP in regard.

## 2022-03-08 DIAGNOSIS — Z8546 Personal history of malignant neoplasm of prostate: Secondary | ICD-10-CM | POA: Diagnosis not present

## 2022-03-08 DIAGNOSIS — E349 Endocrine disorder, unspecified: Secondary | ICD-10-CM | POA: Diagnosis not present

## 2022-03-16 DIAGNOSIS — E291 Testicular hypofunction: Secondary | ICD-10-CM | POA: Diagnosis not present

## 2022-03-27 DIAGNOSIS — E291 Testicular hypofunction: Secondary | ICD-10-CM | POA: Diagnosis not present

## 2022-04-02 DIAGNOSIS — E291 Testicular hypofunction: Secondary | ICD-10-CM | POA: Diagnosis not present

## 2022-04-03 ENCOUNTER — Ambulatory Visit (INDEPENDENT_AMBULATORY_CARE_PROVIDER_SITE_OTHER): Payer: Medicare Other | Admitting: Emergency Medicine

## 2022-04-03 ENCOUNTER — Ambulatory Visit (HOSPITAL_BASED_OUTPATIENT_CLINIC_OR_DEPARTMENT_OTHER): Payer: Medicare Other | Attending: Family Medicine | Admitting: Internal Medicine

## 2022-04-03 ENCOUNTER — Ambulatory Visit (INDEPENDENT_AMBULATORY_CARE_PROVIDER_SITE_OTHER): Payer: Medicare Other

## 2022-04-03 ENCOUNTER — Encounter: Payer: Self-pay | Admitting: Emergency Medicine

## 2022-04-03 VITALS — BP 146/96 | HR 70 | Temp 98.1°F | Ht 73.0 in | Wt 208.0 lb

## 2022-04-03 DIAGNOSIS — N1831 Chronic kidney disease, stage 3a: Secondary | ICD-10-CM

## 2022-04-03 DIAGNOSIS — R400 Somnolence: Secondary | ICD-10-CM

## 2022-04-03 DIAGNOSIS — R5383 Other fatigue: Secondary | ICD-10-CM

## 2022-04-03 DIAGNOSIS — F418 Other specified anxiety disorders: Secondary | ICD-10-CM | POA: Diagnosis not present

## 2022-04-03 DIAGNOSIS — G4700 Insomnia, unspecified: Secondary | ICD-10-CM | POA: Diagnosis not present

## 2022-04-03 DIAGNOSIS — R630 Anorexia: Secondary | ICD-10-CM | POA: Diagnosis not present

## 2022-04-03 DIAGNOSIS — Z9079 Acquired absence of other genital organ(s): Secondary | ICD-10-CM

## 2022-04-03 DIAGNOSIS — I10 Essential (primary) hypertension: Secondary | ICD-10-CM | POA: Diagnosis not present

## 2022-04-03 DIAGNOSIS — E291 Testicular hypofunction: Secondary | ICD-10-CM | POA: Diagnosis not present

## 2022-04-03 DIAGNOSIS — R634 Abnormal weight loss: Secondary | ICD-10-CM | POA: Diagnosis not present

## 2022-04-03 DIAGNOSIS — R5382 Chronic fatigue, unspecified: Secondary | ICD-10-CM

## 2022-04-03 MED ORDER — ZOLPIDEM TARTRATE 10 MG PO TABS: 10.0000 mg | ORAL_TABLET | Freq: Every evening | ORAL | 1 refills | Status: DC | PRN

## 2022-04-03 NOTE — Progress Notes (Signed)
Marcus Crawford 71 y.o.   Chief Complaint  Patient presents with  . Anxiety    Weight loss, fatigue, no appetite, BP elevated     HISTORY OF PRESENT ILLNESS: This is a 71 y.o. male complaining of ***. ***.  HPI   Prior to Admission medications   Medication Sig Start Date End Date Taking? Authorizing Provider  cholecalciferol (VITAMIN D) 1000 UNITS tablet Take 1,000 Units by mouth daily.    Yes [provider]  levocetirizine (XYZAL) 5 MG tablet TAKE 1 TABLET(5 MG) BY MOUTH EVERY EVENING 07/31/21  Yes Janith Lima, MD  pravastatin (PRAVACHOL) 10 MG tablet TAKE 1 TABLET(10 MG) BY MOUTH DAILY 12/01/21  Yes Janith Lima, MD  RABEprazole (ACIPHEX) 20 MG tablet TAKE ONE TABLET BY MOUTH ONE TIME DAILY Patient taking differently: every other day. 10/15/21  Yes Janith Lima, MD  tadalafil (CIALIS) 20 MG tablet TAKE ONE TABLET BY MOUTH ONE TIME DAILY AS NEEDED AS DIRECTED 01/05/19  Yes [provider]    Allergies  Allergen Reactions  . Penicillins     Rash (RN clarified with pt) No SOB/swelling  . Sulfa Antibiotics Rash  . Aspirin Other (See Comments)    Patient Active Problem List   Diagnosis Date Noted  . Bilateral cold feet 10/30/2021  . Thoracic back pain 08/24/2021  . Other fatigue 01/30/2021  . RBBB (right bundle branch block) 01/30/2021  . Trochanteric bursitis, right hip 07/23/2019  . Hyperlipidemia with target LDL less than 130 01/19/2019  . Seasonal allergic rhinitis due to pollen 01/15/2018  . Obesity, Class I, BMI 30-34.9 01/12/2016  . Kidney disease, chronic, stage III (GFR 30-59 ml/min) ([place]) 01/12/2016  . Prostate cancer ([place]) 07/30/2013  . Vitamin D deficiency 06/15/2013  . Urolithiasis 02/08/2012  . Prediabetes 10/14/2008  . Essential hypertension 09/15/2007  . Esophageal reflux 12/06/2006  . History of colonic polyps 12/06/2006    Past Medical History:  Diagnosis Date  . Anemia    early 20's  . Bell's palsy 1977  . Blood clot in  vein 2005   left leg "blood clot"-treated as OP; no residual  . Blood transfusion without reported diagnosis    with appendectomy  . Borderline diabetic    no meds  . Cancer ([place]) 08/2013   prostate/ had surgery/ no chemo or radiation  . Cataract    forming  . CKD (chronic kidney disease) stage 3, GFR 30-59 ml/min ([facility]) saw dr webb 6 months ago   on rampiril for kidney function also  . Colonic polyp    X3 ,hyperplastic  . Diverticulosis of colon   . GERD (gastroesophageal reflux disease)   . H/O hiatal hernia   . Hemorrhoid   . Hyperlipidemia   . Hypertension    past hx - no meds since 2013 with  adrenal gland surgery  . Kidney stone on left side 2013   asymptomatic , incidental finding  . Migraine last 6-7 yrs ago   PMH of ; resolved post adrenal adenoma resection  . Nephrolithiasis 2013   kidney stone  as incidental finding on imaging  . PONV (postoperative nausea and vomiting)     Past Surgical History:  Procedure Laterality Date  . adrenal venous sampling  03-2012  . ADRENALECTOMY  04/14/2012   Procedure: ADRENALECTOMY;  Surgeon: Ralene Ok, MD;  Location: [facility];  Service: General;  Laterality: Left;  Left Adrenal Excision  . APPENDECTOMY  1985  . COLONOSCOPY     last  9/12, Dr Fuller Plan; due 2017  . ESOPHAGEAL DILATION  2001   Dr Velora Heckler  . POLYPECTOMY      X 3  . ROBOT ASSISTED LAPAROSCOPIC RADICAL PROSTATECTOMY N/A 09/07/2013   Procedure: ROBOTIC ASSISTED LAPAROSCOPIC RADICAL PROSTATECTOMY LEVEL 1;  Surgeon: Dutch Gray, MD;  Location: [facility];  Service: Urology;  Laterality: N/A;    Social History   Socioeconomic History  . Marital status: Married    Spouse name: Not on file  . Number of children: 0  . Years of education: Not on file  . Highest education level: Not on file  Occupational History  . Occupation: retired  Tobacco Use  . Smoking status: Never  . Smokeless tobacco: Never  Vaping Use  . Vaping Use: Never used  Substance and Sexual Activity   . Alcohol use: Yes    Comment: 1 every 3 mos  . Drug use: No  . Sexual activity: Yes  Other Topics Concern  . Not on file  Social History Narrative  . Not on file   Social Determinants of Health   Financial Resource Strain: Low Risk  (01/04/2022)   Overall Financial Resource Strain (CARDIA)   . Difficulty of Paying Living Expenses: Not hard at all  Food Insecurity: No Food Insecurity (01/04/2022)   Hunger Vital Sign   . Worried About Charity fundraiser in the Last Year: Never true   . Ran Out of Food in the Last Year: Never true  Transportation Needs: No Transportation Needs (01/04/2022)   PRAPARE - Transportation   . Lack of Transportation (Medical): No   . Lack of Transportation (Non-Medical): No  Physical Activity: Sufficiently Active (01/04/2022)   Exercise Vital Sign   . Days of Exercise per Week: 5 days   . Minutes of Exercise per Session: 30 min  Stress: No Stress Concern Present (01/04/2022)   [place]   . Feeling of Stress : Not at all  Social Connections: Socially Integrated (01/04/2022)   Social Connection and Isolation Panel [NHANES]   . Frequency of Communication with Friends and Family: More than three times a week   . Frequency of Social Gatherings with Friends and Family: Not on file   . Attends Religious Services: 1 to 4 times per year   . Active Member of Clubs or Organizations: Yes   . Attends Archivist Meetings: 1 to 4 times per year   . Marital Status: Married  Human resources officer Violence: Not At Risk (01/04/2022)   Humiliation, Afraid, Rape, and Kick questionnaire   . Fear of Current or Ex-Partner: No   . Emotionally Abused: No   . Physically Abused: No   . Sexually Abused: No    Family History  Problem Relation Age of Onset  . Rectal cancer Mother   . Colon cancer Mother 20  . Heart attack Mother 92  . Colon polyps Sister   . Lung cancer Sister        smoker  . Diabetes Sister    . Breast cancer Sister   . Alzheimer's disease Sister   . Heart failure Brother   . Kidney cancer Brother   . Prostate cancer Brother 49  . Heart disease Brother   . Hypertension Brother   . Kidney cancer Brother   . Diabetes Brother   . Heart attack Brother 83  . Esophageal cancer Maternal Uncle   . Kidney cancer Maternal Uncle   . Stomach cancer Neg  Hx      ROS  Today's Vitals   04/03/22 1434 04/03/22 1437  BP: (!) 148/96 (!) 146/96  Pulse: 70   Temp: 98.1 F (36.7 C)   TempSrc: Oral   SpO2: 98%   Weight: 208 lb (94.3 kg)   Height: '6\' 1"'$  (1.854 m)    Body mass index is 27.44 kg/m. Wt Readings from Last 3 Encounters:  04/03/22 208 lb (94.3 kg)  03/01/22 214 lb (97.1 kg)  01/29/22 216 lb 9.6 oz (98.2 kg)    Physical Exam   ASSESSMENT & PLAN: ***   Agustina Caroli, MD [facility]

## 2022-04-03 NOTE — Patient Instructions (Signed)
Fatigue If you have fatigue, you feel tired all the time and have a lack of energy or a lack of motivation. Fatigue may make it difficult to start or complete tasks because of exhaustion. Occasional or mild fatigue is often a normal response to activity or life. However, long-term (chronic) or extreme fatigue may be a symptom of a medical condition such as: Depression. Not having enough red blood cells or hemoglobin in the blood (anemia). A problem with a small gland located in the lower front part of the neck (thyroid disorder). Rheumatologic conditions. These are problems related to the body's defense system (immune system). Infections, especially certain viral infections. Fatigue can also lead to negative health outcomes over time. Follow these instructions at home: Medicines Take over-the-counter and prescription medicines only as told by your health care provider. Take a multivitamin if told by your health care provider. Do not use herbal or dietary supplements unless they are approved by your health care provider. Eating and drinking  Avoid heavy meals in the evening. Eat a well-balanced diet, which includes lean proteins, whole grains, plenty of fruits and vegetables, and low-fat dairy products. Avoid eating or drinking too many products with caffeine in them. Avoid alcohol. Drink enough fluid to keep your urine pale yellow. Activity  Exercise regularly, as told by your health care provider. Use or practice techniques to help you relax, such as yoga, tai chi, meditation, or massage therapy. Lifestyle Change situations that cause you stress. Try to keep your work and personal schedules in balance. Do not use recreational or illegal drugs. General instructions Monitor your fatigue for any changes. Go to bed and get up at the same time every day. Avoid fatigue by pacing yourself during the day and getting enough sleep at night. Maintain a healthy weight. Contact a health care  provider if: Your fatigue does not get better. You have a fever. You suddenly lose or gain weight. You have headaches. You have trouble falling asleep or sleeping through the night. You feel angry, guilty, anxious, or sad. You have swelling in your legs or another part of your body. Get help right away if: You feel confused, feel like you might faint, or faint. Your vision is blurry or you have a severe headache. You have severe pain in your abdomen, your back, or the area between your waist and hips (pelvis). You have chest pain, shortness of breath, or an irregular or fast heartbeat. You are unable to urinate, or you urinate less than normal. You have abnormal bleeding from the rectum, nose, lungs, nipples, or, if you are male, the vagina. You vomit blood. You have thoughts about hurting yourself or others. These symptoms may be an emergency. Get help right away. Call 911. Do not wait to see if the symptoms will go away. Do not drive yourself to the hospital. Get help right away if you feel like you may hurt yourself or others, or have thoughts about taking your own life. Go to your nearest emergency room or: Call 911. Call the [facility] at [phone] or 988. This is open 24 hours a day. Text the Crisis Text Line at 979-823-0385. Summary If you have fatigue, you feel tired all the time and have a lack of energy or a lack of motivation. Fatigue may make it difficult to start or complete tasks because of exhaustion. Long-term (chronic) or extreme fatigue may be a symptom of a medical condition. Exercise regularly, as told by your health care provider.  Change situations that cause you stress. Try to keep your work and personal schedules in balance. This information is not intended to replace advice given to you by your health care provider. Make sure you discuss any questions you have with your health care provider. Document Revised: 04/10/2021 Document  Reviewed: 04/10/2021 Elsevier Patient Education  2023 Elsevier Inc.  

## 2022-04-04 DIAGNOSIS — R630 Anorexia: Secondary | ICD-10-CM | POA: Insufficient documentation

## 2022-04-04 DIAGNOSIS — F418 Other specified anxiety disorders: Secondary | ICD-10-CM | POA: Insufficient documentation

## 2022-04-04 DIAGNOSIS — G4700 Insomnia, unspecified: Secondary | ICD-10-CM | POA: Insufficient documentation

## 2022-04-04 DIAGNOSIS — R634 Abnormal weight loss: Secondary | ICD-10-CM | POA: Insufficient documentation

## 2022-04-04 DIAGNOSIS — E291 Testicular hypofunction: Secondary | ICD-10-CM | POA: Insufficient documentation

## 2022-04-04 DIAGNOSIS — R5382 Chronic fatigue, unspecified: Secondary | ICD-10-CM | POA: Insufficient documentation

## 2022-04-04 NOTE — Assessment & Plan Note (Signed)
History of prostate cancer in the past Normal colonoscopy Normal chest x-ray.  Non-smoker. Needs work-up for malignancy. CT abdomen and pelvis ordered today.

## 2022-04-04 NOTE — Assessment & Plan Note (Signed)
Contributing to some of the symptoms.

## 2022-04-04 NOTE — Assessment & Plan Note (Signed)
Differential diagnosis discussed. Needs work-up Needs also to follow-up with PCP Sleep apnea needs to be ruled out.

## 2022-04-04 NOTE — Assessment & Plan Note (Signed)
Denies depression.  Multiple symptoms and lack of diagnosis creating anxiety.  Not sleeping well. May benefit from Ambien at bedtime.

## 2022-04-04 NOTE — Assessment & Plan Note (Signed)
Differential diagnosis discussed. Needs work-up. History of low testosterone Chronic kidney disease History of cancer in the past May have sleep apnea.

## 2022-04-05 ENCOUNTER — Emergency Department (HOSPITAL_COMMUNITY)
Admission: EM | Admit: 2022-04-05 | Discharge: 2022-04-05 | Discharge status: Against Medical Advice | Payer: Medicare Other | Attending: Emergency Medicine | Admitting: Emergency Medicine

## 2022-04-05 ENCOUNTER — Encounter (HOSPITAL_COMMUNITY): Payer: Self-pay

## 2022-04-05 DIAGNOSIS — R5383 Other fatigue: Secondary | ICD-10-CM | POA: Diagnosis not present

## 2022-04-05 DIAGNOSIS — I10 Essential (primary) hypertension: Secondary | ICD-10-CM | POA: Diagnosis not present

## 2022-04-05 DIAGNOSIS — Z5321 Procedure and treatment not carried out due to patient leaving prior to being seen by health care provider: Secondary | ICD-10-CM | POA: Diagnosis not present

## 2022-04-05 DIAGNOSIS — R531 Weakness: Secondary | ICD-10-CM | POA: Insufficient documentation

## 2022-04-05 LAB — CBC
HCT: 42.5 % (ref 39.0–52.0)
Hemoglobin: 14.4 g/dL (ref 13.0–17.0)
MCH: 30.9 pg (ref 26.0–34.0)
MCHC: 33.9 g/dL (ref 30.0–36.0)
MCV: 91.2 fL (ref 80.0–100.0)
Platelets: 185 10*3/uL (ref 150–400)
RBC: 4.66 MIL/uL (ref 4.22–5.81)
RDW: 12.7 % (ref 11.5–15.5)
WBC: 7.3 10*3/uL (ref 4.0–10.5)
nRBC: 0 % (ref 0.0–0.2)

## 2022-04-05 LAB — BASIC METABOLIC PANEL
Anion gap: 7 (ref 5–15)
BUN: 21 mg/dL (ref 8–23)
CO2: 23 mmol/L (ref 22–32)
Calcium: 9.2 mg/dL (ref 8.9–10.3)
Chloride: 109 mmol/L (ref 98–111)
Creatinine, Ser: 1.36 mg/dL — ABNORMAL HIGH (ref 0.61–1.24)
GFR, Estimated: 55 mL/min — ABNORMAL LOW (ref >60)
Glucose, Bld: 128 mg/dL — ABNORMAL HIGH (ref 70–99)
Potassium: 4.2 mmol/L (ref 3.5–5.1)
Sodium: 139 mmol/L (ref 135–145)

## 2022-04-05 LAB — CBG MONITORING, ED: Glucose-Capillary: 103 mg/dL — ABNORMAL HIGH (ref 70–99)

## 2022-04-05 NOTE — ED Notes (Signed)
Per registration, patient turned in labels and reports he is leaving.

## 2022-04-05 NOTE — ED Provider Triage Note (Signed)
Emergency Medicine Provider Triage Evaluation Note  Marcus Crawford , a 71 y.o. male  was evaluated in triage.  Reports feeling weak for a year.  Said that he found out he had low testosterone and started being treated with the medication.  Continues to feel weak and says that he can barely think, walk or anything without feeling tired  Review of Systems  Positive: Weakness and fatigue Negative:   Physical Exam  BP 139/87 (BP Location: Left Arm)   Pulse 76   Temp 98.2 F (36.8 C) (Oral)   Resp 16   SpO2 95%  Gen:   Awake, no distress   Resp:  Normal effort  MSK:   Moves extremities without difficulty  Other:  Pale, alert and oriented  Medical Decision Making  Medically screening exam initiated at 6:14 PM.  Appropriate orders placed.  Marcus Crawford was informed that the remainder of the evaluation will be completed by another provider, this initial triage assessment does not replace that evaluation, and the importance of remaining in the ED until their evaluation is complete.     Marcus Hammock, PA-C 04/05/22 1815

## 2022-04-05 NOTE — ED Triage Notes (Signed)
Pt presents via EMS from home with c/o increased weakness over the past year. Pt has been to multiple MD doctors and has had multiple tests run for this issue. Pt reports today, the weakness is much worse and he feels like he is having more difficulty than normal.

## 2022-04-10 ENCOUNTER — Ambulatory Visit (INDEPENDENT_AMBULATORY_CARE_PROVIDER_SITE_OTHER): Payer: Medicare Other | Admitting: Internal Medicine

## 2022-04-10 ENCOUNTER — Other Ambulatory Visit: Payer: Self-pay

## 2022-04-10 ENCOUNTER — Encounter: Payer: Self-pay | Admitting: Internal Medicine

## 2022-04-10 ENCOUNTER — Emergency Department (HOSPITAL_COMMUNITY): Payer: Medicare Other

## 2022-04-10 ENCOUNTER — Encounter (HOSPITAL_COMMUNITY): Payer: Self-pay

## 2022-04-10 ENCOUNTER — Emergency Department (HOSPITAL_COMMUNITY)
Admission: EM | Admit: 2022-04-10 | Discharge: 2022-04-11 | Disposition: A | Discharge status: Home / Self Care | Payer: Medicare Other | Attending: Emergency Medicine | Admitting: Emergency Medicine

## 2022-04-10 VITALS — BP 126/88 | HR 68 | Temp 98.2°F | Ht 73.0 in | Wt 208.0 lb

## 2022-04-10 DIAGNOSIS — I10 Essential (primary) hypertension: Secondary | ICD-10-CM | POA: Diagnosis present

## 2022-04-10 DIAGNOSIS — E785 Hyperlipidemia, unspecified: Secondary | ICD-10-CM | POA: Diagnosis present

## 2022-04-10 DIAGNOSIS — R531 Weakness: Secondary | ICD-10-CM | POA: Insufficient documentation

## 2022-04-10 DIAGNOSIS — N189 Chronic kidney disease, unspecified: Secondary | ICD-10-CM | POA: Insufficient documentation

## 2022-04-10 DIAGNOSIS — N1831 Chronic kidney disease, stage 3a: Secondary | ICD-10-CM | POA: Insufficient documentation

## 2022-04-10 DIAGNOSIS — R457 State of emotional shock and stress, unspecified: Secondary | ICD-10-CM | POA: Diagnosis not present

## 2022-04-10 DIAGNOSIS — R5383 Other fatigue: Secondary | ICD-10-CM | POA: Diagnosis not present

## 2022-04-10 DIAGNOSIS — R5382 Chronic fatigue, unspecified: Secondary | ICD-10-CM | POA: Diagnosis not present

## 2022-04-10 DIAGNOSIS — Z8249 Family history of ischemic heart disease and other diseases of the circulatory system: Secondary | ICD-10-CM | POA: Diagnosis not present

## 2022-04-10 DIAGNOSIS — K219 Gastro-esophageal reflux disease without esophagitis: Secondary | ICD-10-CM | POA: Diagnosis present

## 2022-04-10 DIAGNOSIS — M4802 Spinal stenosis, cervical region: Secondary | ICD-10-CM | POA: Diagnosis not present

## 2022-04-10 DIAGNOSIS — I129 Hypertensive chronic kidney disease with stage 1 through stage 4 chronic kidney disease, or unspecified chronic kidney disease: Secondary | ICD-10-CM | POA: Insufficient documentation

## 2022-04-10 DIAGNOSIS — R0602 Shortness of breath: Secondary | ICD-10-CM | POA: Insufficient documentation

## 2022-04-10 DIAGNOSIS — R0600 Dyspnea, unspecified: Secondary | ICD-10-CM | POA: Diagnosis not present

## 2022-04-10 DIAGNOSIS — G9332 Myalgic encephalomyelitis/chronic fatigue syndrome: Secondary | ICD-10-CM | POA: Insufficient documentation

## 2022-04-10 DIAGNOSIS — Z8669 Personal history of other diseases of the nervous system and sense organs: Secondary | ICD-10-CM | POA: Diagnosis not present

## 2022-04-10 DIAGNOSIS — R4182 Altered mental status, unspecified: Secondary | ICD-10-CM | POA: Diagnosis not present

## 2022-04-10 DIAGNOSIS — M50223 Other cervical disc displacement at C6-C7 level: Secondary | ICD-10-CM | POA: Diagnosis not present

## 2022-04-10 DIAGNOSIS — M5021 Other cervical disc displacement,  high cervical region: Secondary | ICD-10-CM | POA: Diagnosis not present

## 2022-04-10 DIAGNOSIS — Z8546 Personal history of malignant neoplasm of prostate: Secondary | ICD-10-CM | POA: Insufficient documentation

## 2022-04-10 LAB — CBC WITH DIFFERENTIAL/PLATELET
Abs Immature Granulocytes: 0.03 10*3/uL (ref 0.00–0.07)
Basophils Absolute: 0 10*3/uL (ref 0.0–0.1)
Basophils Relative: 1 %
Eosinophils Absolute: 0.1 10*3/uL (ref 0.0–0.5)
Eosinophils Relative: 1 %
HCT: 43.7 % (ref 39.0–52.0)
Hemoglobin: 14.5 g/dL (ref 13.0–17.0)
Immature Granulocytes: 0 %
Lymphocytes Relative: 19 %
Lymphs Abs: 1.4 10*3/uL (ref 0.7–4.0)
MCH: 30.3 pg (ref 26.0–34.0)
MCHC: 33.2 g/dL (ref 30.0–36.0)
MCV: 91.4 fL (ref 80.0–100.0)
Monocytes Absolute: 0.5 10*3/uL (ref 0.1–1.0)
Monocytes Relative: 7 %
Neutro Abs: 5.2 10*3/uL (ref 1.7–7.7)
Neutrophils Relative %: 72 %
Platelets: 159 10*3/uL (ref 150–400)
RBC: 4.78 MIL/uL (ref 4.22–5.81)
RDW: 12.5 % (ref 11.5–15.5)
WBC: 7.2 10*3/uL (ref 4.0–10.5)
nRBC: 0 % (ref 0.0–0.2)

## 2022-04-10 LAB — BASIC METABOLIC PANEL
Anion gap: 9 (ref 5–15)
BUN: 18 mg/dL (ref 8–23)
CO2: 25 mmol/L (ref 22–32)
Calcium: 9.4 mg/dL (ref 8.9–10.3)
Chloride: 104 mmol/L (ref 98–111)
Creatinine, Ser: 1.41 mg/dL — ABNORMAL HIGH (ref 0.61–1.24)
GFR, Estimated: 53 mL/min — ABNORMAL LOW (ref >60)
Glucose, Bld: 115 mg/dL — ABNORMAL HIGH (ref 70–99)
Potassium: 4.4 mmol/L (ref 3.5–5.1)
Sodium: 138 mmol/L (ref 135–145)

## 2022-04-10 LAB — TROPONIN I (HIGH SENSITIVITY)
High Sens Troponin I: 7 ng/L (ref 2–17)
Troponin I (High Sensitivity): 5 ng/L (ref <18)

## 2022-04-10 LAB — T4, FREE: Free T4: 1.01 ng/dL (ref 0.61–1.12)

## 2022-04-10 LAB — TSH: TSH: 1.697 u[IU]/mL (ref 0.350–4.500)

## 2022-04-10 LAB — BRAIN NATRIURETIC PEPTIDE: B Natriuretic Peptide: 42.3 pg/mL (ref 0.0–100.0)

## 2022-04-10 NOTE — Patient Instructions (Signed)
Fatigue If you have fatigue, you feel tired all the time and have a lack of energy or a lack of motivation. Fatigue may make it difficult to start or complete tasks because of exhaustion. Occasional or mild fatigue is often a normal response to activity or life. However, long-term (chronic) or extreme fatigue may be a symptom of a medical condition such as: Depression. Not having enough red blood cells or hemoglobin in the blood (anemia). A problem with a small gland located in the lower front part of the neck (thyroid disorder). Rheumatologic conditions. These are problems related to the body's defense system (immune system). Infections, especially certain viral infections. Fatigue can also lead to negative health outcomes over time. Follow these instructions at home: Medicines Take over-the-counter and prescription medicines only as told by your health care provider. Take a multivitamin if told by your health care provider. Do not use herbal or dietary supplements unless they are approved by your health care provider. Eating and drinking  Avoid heavy meals in the evening. Eat a well-balanced diet, which includes lean proteins, whole grains, plenty of fruits and vegetables, and low-fat dairy products. Avoid eating or drinking too many products with caffeine in them. Avoid alcohol. Drink enough fluid to keep your urine pale yellow. Activity  Exercise regularly, as told by your health care provider. Use or practice techniques to help you relax, such as yoga, tai chi, meditation, or massage therapy. Lifestyle Change situations that cause you stress. Try to keep your work and personal schedules in balance. Do not use recreational or illegal drugs. General instructions Monitor your fatigue for any changes. Go to bed and get up at the same time every day. Avoid fatigue by pacing yourself during the day and getting enough sleep at night. Maintain a healthy weight. Contact a health care  provider if: Your fatigue does not get better. You have a fever. You suddenly lose or gain weight. You have headaches. You have trouble falling asleep or sleeping through the night. You feel angry, guilty, anxious, or sad. You have swelling in your legs or another part of your body. Get help right away if: You feel confused, feel like you might faint, or faint. Your vision is blurry or you have a severe headache. You have severe pain in your abdomen, your back, or the area between your waist and hips (pelvis). You have chest pain, shortness of breath, or an irregular or fast heartbeat. You are unable to urinate, or you urinate less than normal. You have abnormal bleeding from the rectum, nose, lungs, nipples, or, if you are male, the vagina. You vomit blood. You have thoughts about hurting yourself or others. These symptoms may be an emergency. Get help right away. Call 911. Do not wait to see if the symptoms will go away. Do not drive yourself to the hospital. Get help right away if you feel like you may hurt yourself or others, or have thoughts about taking your own life. Go to your nearest emergency room or: Call 911. Call the [facility] at [phone] or 988. This is open 24 hours a day. Text the Crisis Text Line at 979-823-0385. Summary If you have fatigue, you feel tired all the time and have a lack of energy or a lack of motivation. Fatigue may make it difficult to start or complete tasks because of exhaustion. Long-term (chronic) or extreme fatigue may be a symptom of a medical condition. Exercise regularly, as told by your health care provider.  Change situations that cause you stress. Try to keep your work and personal schedules in balance. This information is not intended to replace advice given to you by your health care provider. Make sure you discuss any questions you have with your health care provider. Document Revised: 04/10/2021 Document  Reviewed: 04/10/2021 Elsevier Patient Education  2023 Elsevier Inc.  

## 2022-04-10 NOTE — Progress Notes (Signed)
Subjective:  Patient ID: Marcus Crawford, male    DOB: 11-Jun-1951  Age: 71 y.o. MRN: [id number]  CC: No chief complaint on file.   HPI Marcus Crawford presents for f/up -  He complains of a 4 to 2-monthhistory of worsening fatigue and diffuse weakness.  Over the last week he is also developed shortness of breath.  He drove himself here but says he does not feel comfortable driving himself home.  He has been told that his symptoms are related to hypogonadism so he tried a topical testosterone supplement and it caused him to feel anxious so he is no longer using it.   Outpatient Medications Prior to Visit  Medication Sig Dispense Refill   cholecalciferol (VITAMIN D) 1000 UNITS tablet Take 1,000 Units by mouth daily.      levocetirizine (XYZAL) 5 MG tablet TAKE 1 TABLET(5 MG) BY MOUTH EVERY EVENING 90 tablet 1   pravastatin (PRAVACHOL) 10 MG tablet TAKE 1 TABLET(10 MG) BY MOUTH DAILY 90 tablet 1   RABEprazole (ACIPHEX) 20 MG tablet TAKE ONE TABLET BY MOUTH ONE TIME DAILY (Patient taking differently: every other day.) 90 tablet 1   tadalafil (CIALIS) 20 MG tablet TAKE ONE TABLET BY MOUTH ONE TIME DAILY AS NEEDED AS DIRECTED     zolpidem (AMBIEN) 10 MG tablet Take 1 tablet (10 mg total) by mouth at bedtime as needed for sleep. 15 tablet 1   No facility-administered medications prior to visit.    ROS Review of Systems  Constitutional:  Positive for fatigue. Negative for appetite change, diaphoresis and unexpected weight change.  HENT: Negative.    Eyes: Negative.  Negative for visual disturbance.  Respiratory:  Positive for shortness of breath. Negative for cough, chest tightness and wheezing.   Cardiovascular:  Negative for chest pain, palpitations and leg swelling.  Gastrointestinal:  Negative for abdominal pain, diarrhea, nausea and vomiting.  Endocrine: Negative.   Genitourinary: Negative.   Musculoskeletal:  Positive for gait problem.  Skin: Negative.   Neurological:  Positive for  facial asymmetry and weakness. Negative for dizziness, speech difficulty and numbness.  Hematological:  Negative for adenopathy. Does not bruise/bleed easily.  Psychiatric/Behavioral: Negative.      Objective:  BP 126/88 (BP Location: Left Arm, Patient Position: Sitting, Cuff Size: Large)   Pulse 68   Temp 98.2 F (36.8 C) (Oral)   Ht '6\' 1"'$  (1.854 m)   Wt 208 lb (94.3 kg)   BMI 27.44 kg/m   BP Readings from Last 3 Encounters:  04/10/22 (!) 145/97  04/10/22 126/88  04/05/22 139/87    Wt Readings from Last 3 Encounters:  04/10/22 208 lb (94.3 kg)  04/10/22 208 lb (94.3 kg)  04/03/22 208 lb (94.3 kg)    Physical Exam Constitutional:      Appearance: He is ill-appearing.  HENT:     Nose: Nose normal.     Mouth/Throat:     Mouth: Mucous membranes are moist.  Eyes:     General: No visual field deficit or scleral icterus.    Conjunctiva/sclera: Conjunctivae normal.  Cardiovascular:     Rate and Rhythm: Normal rate and regular rhythm. Occasional Extrasystoles are present.    Heart sounds: Normal heart sounds, S1 normal and S2 normal. No murmur heard.    Comments: EKG- SR with occas PVC, 61 bpm No LVH, Q waves, or ST/T wave changes Pulmonary:     Effort: Pulmonary effort is normal.     Breath sounds: No stridor. No  wheezing, rhonchi or rales.  Abdominal:     General: Abdomen is flat.     Palpations: There is no mass.     Tenderness: There is no abdominal tenderness. There is no guarding.     Hernia: No hernia is present.  Musculoskeletal:     Right lower leg: No edema.     Left lower leg: No edema.  Skin:    General: Skin is warm and dry.  Neurological:     Mental Status: He is lethargic.     Cranial Nerves: Cranial nerve deficit, dysarthria and facial asymmetry present.     Sensory: Sensation is intact.     Motor: Weakness present. No tremor or atrophy.     Coordination: Romberg sign positive.     Gait: Gait abnormal and tandem walk abnormal.     Deep Tendon  Reflexes: Reflexes normal.     Reflex Scores:      Tricep reflexes are 0 on the right side and 0 on the left side.      Bicep reflexes are 1+ on the right side and 1+ on the left side.      Brachioradialis reflexes are 1+ on the right side and 1+ on the left side.      Patellar reflexes are 1+ on the right side and 1+ on the left side.      Achilles reflexes are 0 on the right side and 0 on the left side.    Lab Results  Component Value Date   WBC 7.2 04/10/2022   HGB 14.5 04/10/2022   HCT 43.7 04/10/2022   PLT 159 04/10/2022   GLUCOSE 115 (H) 04/10/2022   CHOL 145 05/02/2021   TRIG 234.0 (H) 05/02/2021   HDL 37.20 (L) 05/02/2021   LDLDIRECT 80.0 05/02/2021   LDLCALC 115 (H) 01/26/2020   ALT 16 01/29/2022   AST 9 01/29/2022   NA 138 04/10/2022   K 4.4 04/10/2022   CL 104 04/10/2022   CREATININE 1.41 (H) 04/10/2022   BUN 18 04/10/2022   CO2 25 04/10/2022   TSH 1.697 04/10/2022   PSA 0.0 10/11/2021   INR 1.12 02/28/2012   HGBA1C 6.0 (H) 02/20/2022   MICROALBUR 0.4 10/14/2008    No results found.  Assessment & Plan:   Diagnoses and all orders for this visit:  SOB (shortness of breath)- His EKG is unchanged.  Will monitor BNP and troponin. -     EKG 12-Lead -     Brain natriuretic peptide; Future -     Troponin I (High Sensitivity); Future -     Troponin I (High Sensitivity) -     Brain natriuretic peptide  Weakness- I am concerned he is having a myasthenia gravis crisis.  He was unable to drive himself home so we called EMS to take him to the ED. -     Acetylcholine receptor, binding; Future -     Striated muscle antibody; Future -     Thyroid Panel With TSH; Future -     Thyroid Panel With TSH -     Striated muscle antibody -     Acetylcholine receptor, binding  Other fatigue-we will screen again for thyroid disease. -     Thyroid Panel With TSH; Future -     Thyroid Panel With TSH  Essential hypertension- His blood pressure is well controlled.  Stage 3a  chronic kidney disease ([place])- His renal function is stable.   I am having Marcus Crawford "Marcus Crawford"  maintain his cholecalciferol, tadalafil, levocetirizine, RABEprazole, pravastatin, and zolpidem.  No orders of the defined types were placed in this encounter.    Follow-up: Return in about 3 weeks (around 05/01/2022).  Scarlette Calico, MD

## 2022-04-10 NOTE — ED Provider Triage Note (Signed)
Emergency Medicine Provider Triage Evaluation Note  Marcus Crawford , a 71 y.o. male  was evaluated in triage.  Pt complains of chronic weakness.  Patient sent by PCP due to concerns about myasthenia gravis.  Patient has baseline right-sided facial droop.  Also endorses intermittent shortness of breath.  Review of Systems  Positive: weakness Negative: CP  Physical Exam  BP (!) 145/97 (BP Location: Right Arm)   Pulse 70   Temp 97.7 F (36.5 C) (Oral)   Resp 16   Ht '6\' 1"'$  (1.854 m)   Wt 94.3 kg   SpO2 97%   BMI 27.44 kg/m  Gen:   Awake, no distress   Resp:  Normal effort  MSK:   Moves extremities without difficulty  Other:    Medical Decision Making  Medically screening exam initiated at 2:18 PM.  Appropriate orders placed.  Marcus Crawford was informed that the remainder of the evaluation will be completed by another provider, this initial triage assessment does not replace that evaluation, and the importance of remaining in the ED until their evaluation is complete.  Routine labs ordered     Karie Kirks 04/10/22 1427

## 2022-04-10 NOTE — ED Triage Notes (Signed)
Chronic weakness and fatigue for last couple months and started on testostrone but stopped bc he didn't like how it made h I'm feel.  PCP sent him here for myasthenia gravis and sent him here. Right sided facial droop which is baseline.  BP 150/101 HR 67 Sp97% CBG 127

## 2022-04-11 ENCOUNTER — Emergency Department (HOSPITAL_COMMUNITY): Payer: Medicare Other

## 2022-04-11 DIAGNOSIS — M5021 Other cervical disc displacement,  high cervical region: Secondary | ICD-10-CM | POA: Diagnosis not present

## 2022-04-11 DIAGNOSIS — R5382 Chronic fatigue, unspecified: Secondary | ICD-10-CM | POA: Diagnosis not present

## 2022-04-11 DIAGNOSIS — M4802 Spinal stenosis, cervical region: Secondary | ICD-10-CM | POA: Diagnosis not present

## 2022-04-11 DIAGNOSIS — R4182 Altered mental status, unspecified: Secondary | ICD-10-CM | POA: Diagnosis not present

## 2022-04-11 DIAGNOSIS — M50223 Other cervical disc displacement at C6-C7 level: Secondary | ICD-10-CM | POA: Diagnosis not present

## 2022-04-11 DIAGNOSIS — R531 Weakness: Secondary | ICD-10-CM | POA: Diagnosis not present

## 2022-04-11 DIAGNOSIS — R5383 Other fatigue: Secondary | ICD-10-CM | POA: Diagnosis not present

## 2022-04-11 DIAGNOSIS — Z8669 Personal history of other diseases of the nervous system and sense organs: Secondary | ICD-10-CM | POA: Diagnosis not present

## 2022-04-11 LAB — URINALYSIS, ROUTINE W REFLEX MICROSCOPIC
Bilirubin Urine: NEGATIVE
Glucose, UA: NEGATIVE mg/dL
Hgb urine dipstick: NEGATIVE
Ketones, ur: NEGATIVE mg/dL
Leukocytes,Ua: NEGATIVE
Nitrite: NEGATIVE
Protein, ur: NEGATIVE mg/dL
Specific Gravity, Urine: 1.014 (ref 1.005–1.030)
pH: 7 (ref 5.0–8.0)

## 2022-04-11 LAB — HEPATIC FUNCTION PANEL
ALT: 24 U/L (ref 0–44)
AST: 14 U/L — ABNORMAL LOW (ref 15–41)
Albumin: 3.8 g/dL (ref 3.5–5.0)
Alkaline Phosphatase: 61 U/L (ref 38–126)
Bilirubin, Direct: <0.1 mg/dL (ref 0.0–0.2)
Total Bilirubin: 0.7 mg/dL (ref 0.3–1.2)
Total Protein: 5.9 g/dL — ABNORMAL LOW (ref 6.5–8.1)

## 2022-04-11 LAB — MAGNESIUM: Magnesium: 2.1 mg/dL (ref 1.7–2.4)

## 2022-04-11 LAB — CORTISOL: Cortisol, Plasma: 8.1 ug/dL

## 2022-04-11 LAB — CK: Total CK: 325 U/L (ref 49–397)

## 2022-04-11 LAB — TROPONIN I (HIGH SENSITIVITY): Troponin I (High Sensitivity): 5 ng/L (ref <18)

## 2022-04-11 MED ORDER — LORAZEPAM 2 MG/ML IJ SOLN
INTRAMUSCULAR | Status: AC
  Administered 2022-04-11: 0.5 mg via INTRAVENOUS
  Filled 2022-04-11: qty 1

## 2022-04-11 MED ORDER — LORAZEPAM 2 MG/ML IJ SOLN: 0.5000 mg | Freq: Once | INTRAMUSCULAR | Status: AC | PRN

## 2022-04-11 NOTE — Consult Note (Addendum)
Neurology Consultation    Reason for Consult: concern Myasthenia Gravis    CC: fatigue worse end of day x 1 year   HISTORY OF PRESENT ILLNESS   HPI  History is obtained from:patient, who is AAOx4.  Patient is a 71 year old gentleman with a past medical history of right-sided Bell's palsy with residual facial asymmetry, hypertension, GERD, hyperlipidemia, CKD stage IIIa, prediabetes, colon polyps, vitamin D deficiency, prostate cancer, urolithiasis, obesity, seasonal allergies, trochanteric bursitis of right hip, right bundle branch block, thoracic back pain, insomnia, anxiety, chronic fatigue, and hypogonadism.  Yesterday, he saw his primary care provider for history of worsening fatigue/diffuse weakness over the past 4-24-month (patient actually states has been 1 year), with new symptom of shortness of breath this week. The SOB is transient and he denies this now.  In the past, the patient has been told his symptoms are related to hypogonadism so he did try testosterone supplement once but when it worsened anxiety, he stopped this.  [facility] and neurology is consulted to ascertain if patient's presentation is consistent with that of myasthenia gravis.  His presentation began with noting of gait change, walking slow and takes small steps. He has been intentional about his gait since then. He has also felt both arms to be weak, with insidious onset after waking difficulties, but he does not note if weakness is more proximal or distal, feeling like it is entire arm with symmetrical distribution. He was having trouble sleeping, for which PCP put him on short course of Ambien, which has help with sleep as well as the fatiguable weakness.   Reports that he has been diagnosed with low testosterone. He was on testosterone replacement between sept 16 and 28th and he did notice some improvement in his fatigue. However, he was having side effects and  was very jittery and anxious and so he stopped taking it.  ROS + for: loss of smell/taste x several years, constipation x past 6 months (only going q 5-6 days), increase in blood pressure, blurry vision but no double vision or field cut, change in voice (hoarse as day goes on, gradual), He has had decreased appetite and a 15 pound weight loss over the last 3 months, feeling of brain fog simultaneously with weakness but no post-event disorientation   ROS negative for:  Headaches, slow movements, loss of bowel control (incontinent of urine at baseline due to prostate cancer s/p resection), problems with swallowing, dizziness, focality to weakness, abnormal movements, nausea, vomiting, melena   NIF -40, physical therapy saw and recommended outpatient PT.  ROS: Full ROS was performed and is negative except as noted in the HPI. PAST MEDICAL HISTORY    Past Medical History:  Past Medical History:  Diagnosis Date   Anemia    early 20's   Bell's palsy 1977   Blood clot in vein 2005   left leg "blood clot"-treated as OP; no residual   Blood transfusion without reported diagnosis    with appendectomy   Borderline diabetic    no meds   Cancer [place] 08[place]2013   prostate/ had surgery/ no chemo or radiation   Cataract    forming   CKD (chronic kidney disease) stage 3, GFR 30-59 ml/min (HCC) saw dr webb 6 months ago   on rampiril for kidney function also   Colonic polyp    X3 ,hyperplastic   Diverticulosis of colon    GERD (gastroesophageal reflux disease)  H/O hiatal hernia    Hemorrhoid    Hyperlipidemia    Hypertension    past hx - no meds since 2013 with  adrenal gland surgery   Kidney stone on left side 2013   asymptomatic , incidental finding   Migraine last 6-7 yrs ago   PMH of ; resolved post adrenal adenoma resection   Nephrolithiasis 2013   kidney stone  as incidental finding on imaging   PONV (postoperative nausea and vomiting)    No family history on file. Family History   Problem Relation Age of Onset   Rectal cancer Mother    Colon cancer Mother 35   Heart attack Mother 14   Colon polyps Sister    Lung cancer Sister        smoker   Diabetes Sister    Breast cancer Sister    Alzheimer's disease Sister    Heart failure Brother    Kidney cancer Brother    Prostate cancer Brother 36   Heart disease Brother    Hypertension Brother    Kidney cancer Brother    Diabetes Brother    Heart attack Brother 78   Esophageal cancer Maternal Uncle    Kidney cancer Maternal Uncle    Stomach cancer Neg Hx    Allergies:  Allergies  Allergen Reactions   Penicillins Rash    Rash (RN clarified with pt) No SOB/swelling   Sulfa Antibiotics Rash   Aspirin Other (See Comments)    Unknown    Social History:   reports that he has never smoked. He has never used smokeless tobacco. He reports current alcohol use. He reports that he does not use drugs.    Medications (Not in a hospital admission)  EXAMINATION    Current vital signs:    04/11/2022    1:45 PM 04/11/2022   12:30 PM 04/11/2022   12:15 PM  Vitals with BMI  Systolic 283 151 761  Diastolic 94 86 84  Pulse 60 58 50   Examination:  GENERAL: Awake, alert in NAD HEENT: - Normocephalic and atraumatic, dry mm, no lymphadenopathy, no Thyromegally LUNGS - Clear to auscultation bilaterally.  Single breath test 26 CV - S1S2 RRR, equal pulses bilaterally. ABDOMEN - Soft, nontender, nondistended with normoactive BS Ext: warm, well perfused, intact peripheral pulses, no pedal edema  NEURO:  Mental Status: AA&Ox4 .Language: speech is clear,  In tact naming, repetition, fluency, and comprehension. Attention span in tact.  Cranial Nerves:  II: PERRL. Visual fields full to confrontation.  III, IV, VI: EOM in tact. Eyelids elevate symmetrically.  V: Sensation intact V1-3 symmetrically  VII: + facial asymmetry  with lower R facial doop. Not much expressions on his face. VIII: hearing intact to voice IX,  X: Palate elevates symmetrically. Phonation is normal.  YW:VPXTGGYI shrug 5/5 and symmetrical  XII: tongue is midline without fasciculations. Motor:  RUE:  grip   5/5    biceps  5/5    triceps 5 /5 , deltoid 5/5... No fatigable quality  LUE: grip  5/5    biceps  5 /5    triceps  5/5 deltoid 5/5 RLE: thigh  5/5    knee   5/5     plantar flexion   5/5     dorsiflexion   5/5        LLE: thigh   5/5    knee  5/5    plantar flexion   5 /5     dorsiflexion  5/5 Tone: paratonia limits, no obvious cogwheeling. DTRs: 2+ and symmetrical throughout   Sensation- Intact to light touch, pin prick, vibratory sensation, and temperature bilaterall Coordination: FTN intact bilaterally, no ataxia in BLE. Gait- slight retropulsion but no shuffling gait nor stooped posture, can stand on toes and heels. Mildly prolonged turning duration.  LABS   I have reviewed labs in epic and the results pertinent to this consultation are:   Lab Results  Component Value Date   LDLCALC 115 (H) 01/26/2020   Lab Results  Component Value Date   ALT 24 04/11/2022   AST 14 (L) 04/11/2022   ALKPHOS 61 04/11/2022   BILITOT 0.7 04/11/2022   Lab Results  Component Value Date   HGBA1C 6.0 (H) 02/20/2022   Lab Results  Component Value Date   WBC 7.2 04/10/2022   HGB 14.5 04/10/2022   HCT 43.7 04/10/2022   MCV 91.4 04/10/2022   PLT 159 04/10/2022   Lab Results  Component Value Date   VITAMINB12 409 01/29/2022   Lab Results  Component Value Date   FOLATE >24.4 01/30/2021   Lab Results  Component Value Date   NA 138 04/10/2022   K 4.4 04/10/2022   CL 104 04/10/2022   CO2 25 04/10/2022  CK level, magnesium, LFTs, Urinalysis, thyroid function studies and recent prolactin, free T4 and free T3, Vitamin D, vitamin B12 all within normal limits Acetylcholine receptor binding antibodies, striated muscle antibodies are pending DIAGNOSTIC IMAGING/PROCEDURES   I have reviewed the images obtained:, as below     04/11/2022 CT-head:  unremarkable in terms of acute processes, age-related atrophy and mild small vessel changes   MRI brain from 2017  No acute infarct or intracranial hemorrhage.  Very mild chronic microvascular changes.  Mild global atrophy without hydrocephalus.  No intracranial mass lesion noted on this unenhanced exam.  Small right vertebral artery may end in a posterior inferior cerebellar artery distribution. Left vertebral artery and basilar artery are ectatic. Slight impression left lateral medulla. No compression of the cisternal aspect of the fifth cranial nerve noted.  Minimal partial opacification inferior left mastoid air cells. Mild polypoid opacification inferior left maxillary sinus.  Degenerative changes C1 occipital and C1-C2 articulation. ASSESSMENT/PLAN    Assessment:  71 year old patient presenting with 1 year history of gait dysfunction and fatigable weakness with ROS also positive for constipation, olfactory dysfunction, and voice changes of fatigable hoarseness.  On examination, no obvious weakness, no fatiguable ptosis or diplopia. There is slight retropulsion but no shuffling gait nor stooped posture, can stand on toes and heels with increased turning time.He has paratonia. There is no fatigable diplopia. He has baseline R peripheral facial palsy from history Bell's. There is perhaps mild bradykinesia of upper extremities, right greater than left.   His presentation is very non specific. Differential for fatigue is wide and includes hormonal causes including low testosterone, adrenal insufficiency, toxic metabolic in the setting of polypharmacy, poor kidney function, electrolyte imbalance, neurologic including MG, sleep apnea, nutritional including poor po intake and depletion of vitamins.  MRI Brain and  MRI cervical spine is non revealing with no cord compression.  Recommendations: - Recommend continued workup outpatient. - cortisol levels. Testosterone  levels. -Acetylcholine receptor binding antibodies sent  -NIFs/VC not consistent with MG flare up. -CT chest, abdomen, pelvis to rule out occult malignancy (can be completed outpatient) -orthostatic blood pressures that would suggest autonomic dysfunction that can be seen with parkinsonian disorders -outpatient EMG/NCS and follow up with neurology.  --  Lennie Hummer, PA-C Neurology Department   NEUROHOSPITALIST ADDENDUM Performed a face to face diagnostic evaluation.   I have reviewed the contents of history and physical exam as documented by PA/ARNP/Resident and agree with above documentation.  I have discussed and formulated the above plan as documented. Edits to the note have been made as needed.  Donnetta Simpers, MD [facility] [phone]   If 7pm to 7am, please call on call as listed on AMION.

## 2022-04-11 NOTE — ED Notes (Signed)
Patient continuously putting on call light. Patient asking about when he will be going upstairs.

## 2022-04-11 NOTE — ED Provider Notes (Signed)
5:15 PM Care assumed from Dr. Maylon Peppers.  At time of transfer of care, patient is awaiting recommendations from neurology in regards to disposition for this weakness that has been bothering him for several weeks.  According to previous team, patient was assessed by physical therapy that felt patient may be stable for discharge home from a mobility standpoint but was waiting on neurology recommendations.  The hospitalist team reportedly was called earlier and they determined that if neurology feels the patient is admission, he needs to be repaged out for admission.  6:20 PM Spoke to neurology, they are ordering some MRIs.  If MRIs are reassuring, patient is likely stable for discharge home to continue outpatient work-up.   Neurology saw the patient and walked him and feel he is safe for discharge home.  They recommend he follow-up with outpatient neurology.  Patient agreed with plan of care and patient was discharged in good condition.   Clinical Impression: 1. Generalized weakness   2. Chronic fatigue     Disposition: Discharge  Condition: Good  I have discussed the results, Dx and Tx plan with the pt(& family if present). He/she/they expressed understanding and agree(s) with the plan. Discharge instructions discussed at great length. Strict return precautions discussed and pt &/or family have verbalized understanding of the instructions. No further questions at time of discharge.    New Prescriptions   No medications on file    Follow Up: [facility] [place] [id number] [phone]   with neurology  Janith Lima, MD [place] [address] [phone]     [place] [address] [id number] mc [place]       Karrah Mangini, Gwenyth Allegra, MD 04/11/22 2223

## 2022-04-11 NOTE — ED Notes (Signed)
Patient placed in recliner for comfort 

## 2022-04-11 NOTE — Discharge Instructions (Signed)
Your history, exam, work-up today did not reveal an acute cause of your symptoms.  The neurology team would like you to follow-up as an outpatient to continue the work-up to determine the exact cause of your worsening symptoms.  Please rest and stay hydrated and follow-up with the outpatient neurology team.  Please also follow-up with your PCP.  If any symptoms change or worsen acutely, please return to the nearest emergency department.

## 2022-04-11 NOTE — ED Notes (Signed)
Patient transported to MRI 

## 2022-04-11 NOTE — ED Provider Notes (Signed)
[facility] EMERGENCY DEPARTMENT Provider Note   CSN: [id number] Arrival date & time: 04/10/22  1411     History  Chief Complaint  Patient presents with   Weakness    Marcus Crawford is a 71 y.o. male.  Patient is a 71 year old male with a past medical history of hypertension, CKD and hyperlipidemia presenting to the emergency department with generalized weakness and fatigue.  The patient states that about 1 year ago he started to notice difficulty with ambulation and feeling off balance.  He states that over the last 3 months or so he has had increasing generalized weakness and fatigue.  He states that when he gets out of bed in the morning after about 30 minutes he feels too fatigued to do any activities and needs to go lay down.  He states that he has been seeing his primary doctor and urologist and initially thought his symptoms were due to low testosterone but he states that since taking a testosterone supplement he only gets intermittent episodes of feeling "hyper" but has no improvement of his weakness or fatigue.  He states that he saw his PCP yesterday who was concerned for his amount of weakness and transferred him to the emergency department for further evaluation.  He denies any headaches, numbness or tingling or pain.  He denies any nausea or vomiting but does report a decreased appetite and approximately 15 pound weight loss in the last 3 months.  He reports feeling constipated.  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   Weakness      Home Medications Prior to Admission medications   Medication Sig Start Date End Date Taking? Authorizing Provider  cholecalciferol (VITAMIN D) 1000 UNITS tablet Take 1,000 Units by mouth daily.    Yes [provider]  hypromellose (SYSTANE OVERNIGHT THERAPY) 0.3 % GEL ophthalmic ointment Place 1 Application into both eyes at bedtime.   Yes [provider]   levocetirizine (XYZAL) 5 MG tablet TAKE 1 TABLET(5 MG) BY MOUTH EVERY EVENING Patient taking differently: Take 5 mg by mouth daily as needed for allergies. 07/31/21  Yes Janith Lima, MD  Polyvinyl Alcohol-Povidone (REFRESH OP) Place 1 drop into both eyes 4 (four) times daily as needed (dryness).   Yes [provider]  pravastatin (PRAVACHOL) 10 MG tablet TAKE 1 TABLET(10 MG) BY MOUTH DAILY Patient taking differently: Take 10 mg by mouth at bedtime. 12/01/21  Yes Janith Lima, MD  RABEprazole (ACIPHEX) 20 MG tablet TAKE ONE TABLET BY MOUTH ONE TIME DAILY Patient taking differently: Take 20 mg by mouth every other day. 10/15/21  Yes Janith Lima, MD  tadalafil (CIALIS) 20 MG tablet Take 20 mg by mouth daily as needed for erectile dysfunction. 01/05/19  Yes [provider]  zolpidem (AMBIEN) 10 MG tablet Take 1 tablet (10 mg total) by mouth at bedtime as needed for sleep. 04/03/22 05/03/22 Yes SagardiaInes Bloomer, MD  Testosterone 1.62 % GEL Place onto the skin. Patient not taking: Reported on 04/11/2022    [provider]      Allergies    Penicillins, Sulfa antibiotics, and Aspirin    Review of Systems   Review of Systems  Neurological:  Positive for weakness.    Physical Exam Updated Vital Signs BP 133/82   Pulse 66   Temp 98 F (36.7 C) (Oral)   Resp 15   Ht '6\' 1"'$  (1.854 m)   Wt  94.3 kg   SpO2 96%   BMI 27.44 kg/m  Physical Exam Vitals and nursing note reviewed.  Constitutional:      General: He is not in acute distress.    Comments: Fatigued appearing  HENT:     Head: Normocephalic and atraumatic.     Nose: Nose normal.     Mouth/Throat:     Mouth: Mucous membranes are moist.     Pharynx: Oropharynx is clear.  Eyes:     Extraocular Movements: Extraocular movements intact.     Conjunctiva/sclera: Conjunctivae normal.     Pupils: Pupils are equal, round, and reactive to light.  Cardiovascular:     Rate and Rhythm: Normal rate and  regular rhythm.     Pulses: Normal pulses.     Heart sounds: Normal heart sounds.  Pulmonary:     Effort: Pulmonary effort is normal.     Breath sounds: Normal breath sounds.  Abdominal:     General: Abdomen is flat.     Palpations: Abdomen is soft.     Tenderness: There is no abdominal tenderness.  Musculoskeletal:        General: Normal range of motion.     Cervical back: Normal range of motion.     Right lower leg: No edema.     Left lower leg: No edema.  Skin:    General: Skin is warm and dry.  Neurological:     Mental Status: He is alert and oriented to person, place, and time.     Comments: Right-sided facial droop when smiling at patient's baseline No other cranial nerve deficits 5 out of 5 strength with eyelid squeeze 5 out of 5 strength in bilateral upper and lower extremities Sensation intact in all 4 extremities Normal finger-to-nose bilaterally Normal speech  Psychiatric:        Mood and Affect: Mood normal.        Behavior: Behavior normal.     ED Results / Procedures / Treatments   Labs (all labs ordered are listed, but only abnormal results are displayed) Labs Reviewed  BASIC METABOLIC PANEL - Abnormal; Notable for the following components:      Result Value   Glucose, Bld 115 (*)    Creatinine, Ser 1.41 (*)    GFR, Estimated 53 (*)    All other components within normal limits  HEPATIC FUNCTION PANEL - Abnormal; Notable for the following components:   Total Protein 5.9 (*)    AST 14 (*)    All other components within normal limits  CBC WITH DIFFERENTIAL/PLATELET  BRAIN NATRIURETIC PEPTIDE  TSH  T4, FREE  URINALYSIS, ROUTINE W REFLEX MICROSCOPIC  MAGNESIUM  CK  TROPONIN I (HIGH SENSITIVITY)  TROPONIN I (HIGH SENSITIVITY)    EKG EKG Interpretation  Date/Time:  Wednesday April 11 2022 09:09:58 EDT Ventricular Rate:  65 PR Interval:  191 QRS Duration: 113 QT Interval:  409 QTC Calculation: 426 R Axis:   -19 Text Interpretation: Sinus  rhythm Borderline intraventricular conduction delay No significant change since last tracing Confirmed by Leanord Asal ([id number]) on 04/11/2022 9:40:59 AM  Radiology CT Head Wo Contrast  Result Date: 04/11/2022 CLINICAL DATA:  Altered mental status EXAM: CT HEAD WITHOUT CONTRAST TECHNIQUE: Contiguous axial images were obtained from the base of the skull through the vertex without intravenous contrast. RADIATION DOSE REDUCTION: This exam was performed according to the departmental dose-optimization program which includes automated exposure control, adjustment of the mA and/or kV according to patient size and/or use  of iterative reconstruction technique. COMPARISON:  No prior CT head available, correlation is made with MRI head 01/19/2016 FINDINGS: Brain: No evidence of acute infarction, hemorrhage, mass, mass effect, or midline shift. No hydrocephalus or extra-axial fluid collection. Normal cerebral volume for age. Vascular: No hyperdense vessel. Skull: Normal. Negative for fracture or focal lesion. Sinuses/Orbits: No acute finding. Other: The mastoid air cells are well aerated. IMPRESSION: No acute intracranial process. Electronically Signed   By: Merilyn Baba M.D.   On: 04/11/2022 11:00   DG Chest 1 View  Result Date: 04/10/2022 CLINICAL DATA:  Dyspnea EXAM: CHEST  1 VIEW COMPARISON:  04/03/2022 chest radiograph. FINDINGS: Stable cardiomediastinal silhouette with normal heart size. No pneumothorax. No pleural effusion. Lungs appear clear, with no acute consolidative airspace disease and no pulmonary edema. IMPRESSION: No active disease. Electronically Signed   By: Ilona Sorrel M.D.   On: 04/10/2022 14:56    Procedures Procedures    Medications Ordered in ED Medications - No data to display  ED Course/ Medical Decision Making/ A&P Clinical Course as of 04/11/22 1641  Wed Apr 11, 2022  0942 NIF test low at -40 [VK]  1109 CT head without acute disease. UA negative. CK and LFTs pending. [VK]   0175 Upon reassessment, the patient continues to have strengthen all 4 extremities and is 5 out of 5.  He was able to ambulate steadily with a normal tandem gait and negative Romberg's.  The patient was offered admission for physical therapy and further evaluation if he is feeling too weak to care for himself at home and he would like time to think about it and talk with his family members prior to admission versus discharge. [VK]  1350 Patient is agreeable for admission. [VK]  1407 I spoke with Dr. Rogers Blocker, hospitalist, who recommended neurology evaluation and PT evaluation to determine if the patient is safe for outpatient work up. Neurology and PT have been consulted. [VK]  1626 Patient evaluated by PT who recommended outpatient PT. Signed out to Dr. Sherry Ruffing pending neuro recommendations for disposition [VK]    Clinical Course User Index [VK] Kemper Durie, DO                           Medical Decision Making This patient presents to the ED with chief complaint(s) of generalized weakness and fatigue with pertinent past medical history of hypertension, hyperlipidemia which further complicates the presenting complaint. The complaint involves an extensive differential diagnosis and also carries with it a high risk of complications and morbidity.    The differential diagnosis includes anemia, ACS, arrhythmia, electrolyte abnormality, thyroid dysfunction, patient has no focal neurologic deficits that are new making CVA less likely, considering possible neuromuscular disorder such as myasthenia gravis, rhabdo or other myopathy   Additional history obtained: Additional history obtained from family Records reviewed Primary Care Documents -patient was Romberg positive with abnormal tandem gait in the office yesterday  ED Course and Reassessment: Patient was initially evaluated by provider in triage and had labs, EKG and chest x-ray performed.  Patient's labs were within normal range including  creatinine at his baseline, normal electrolytes, normal thyroid function.  EKG was normal.  He did appear to be in bigeminy on telemetry monitor and repeat EKG was performed here but again shows normal sinus rhythm without any PVCs or PACs.  Patient has chronic right-sided facial droop but no other focal neurologic deficits.  He will have a CT head performed  to evaluate for possible subacute stroke.  Patient is additionally concern for possible myasthenia gravis.  He does have 5 out of 5 muscle strength remaining on exam so likely icepack test or edrophonium tests are unlikely to determine the cause of his symptoms at this time.  He will have an if test performed to evaluate respiratory function.  He will additionally have LFTs and CK performed to evaluate for possible myopathy.  Independent labs interpretation:  The following labs were independently interpreted: within normal range  Independent visualization of imaging: - I independently visualized the following imaging with scope of interpretation limited to determining acute life threatening conditions related to emergency care: CTH, CXR, which revealed no acute disease  Consultation: - Consulted or discussed management/test interpretation w/ external professional: hospitalist  Consideration for admission or further workup: pending neuro recommendations for admission vs discharge for further work up inpatient vs outpatient Social Determinants of health: N/A    Amount and/or Complexity of Data Reviewed Labs: ordered. Radiology: ordered.  Risk Decision regarding hospitalization.          Final Clinical Impression(s) / ED Diagnoses Final diagnoses:  Generalized weakness  Chronic fatigue    Rx / DC Orders ED Discharge Orders     None         Kemper Durie, DO 04/11/22 1641

## 2022-04-11 NOTE — Evaluation (Signed)
Physical Therapy Evaluation Patient Details Name: Marcus Crawford MRN: [id number] DOB: 1951-05-15 Today's Date: 04/11/2022  History of Present Illness  Pt is a 71 y/o male admitted secondary to increased weakness. Workup pending. PMH includes CKD, and HTN.  Clinical Impression  Pt admitted secondary to problem above with deficits below. Pt overall independent with mobility tasks and able to perform DGI tasks without LOB. Did report increased muscle fatigue and tiredness after mobility tasks. Recommend outpatient PT to work on endurance and strengthening. All further needs to be deferred to outpatient. Will sign off. If needs change, please re-consult.        Recommendations for follow up therapy are one component of a multi-disciplinary discharge planning process, led by the attending physician.  Recommendations may be updated based on patient status, additional functional criteria and insurance authorization.  Follow Up Recommendations Outpatient PT      Assistance Recommended at Discharge PRN  Patient can return home with the following  Assistance with cooking/housework    Equipment Recommendations None recommended by PT  Recommendations for Other Services       Functional Status Assessment Patient has had a recent decline in their functional status and demonstrates the ability to make significant improvements in function in a reasonable and predictable amount of time.     Precautions / Restrictions Precautions Precautions: None Restrictions Weight Bearing Restrictions: No      Mobility  Bed Mobility Overal bed mobility: Independent                  Transfers Overall transfer level: Independent                      Ambulation/Gait Ambulation/Gait assistance: Independent Gait Distance (Feet): 150 Feet Assistive device: None Gait Pattern/deviations: WFL(Within Functional Limits) Gait velocity: WFL     General Gait Details: Pt overall steady and able  to perform DGI tasks without LOB. Did report some muscle fatigue and weakness following ambulation. Discussed walking program to perform a thome  Financial trader Rankin (Stroke Patients Only)       Balance Overall balance assessment: No apparent balance deficits (not formally assessed)                               Standardized Balance Assessment Standardized Balance Assessment : Dynamic Gait Index   Dynamic Gait Index Level Surface: Normal Change in Gait Speed: Normal Gait with Horizontal Head Turns: Normal Gait with Vertical Head Turns: Normal Gait and Pivot Turn: Normal Step Over Obstacle: Normal Step Around Obstacles: Normal       Pertinent Vitals/Pain Pain Assessment Pain Assessment: No/denies pain    Home Living Family/patient expects to be discharged to:: Private residence Living Arrangements: Spouse/significant other Available Help at Discharge: Family;Available 24 hours/day Type of Home: House Home Access: Level entry       Home Layout: One level Home Equipment: Rollator (4 wheels)      Prior Function Prior Level of Function : Independent/Modified Independent                     Hand Dominance        Extremity/Trunk Assessment   Upper Extremity Assessment Upper Extremity Assessment: Overall WFL for tasks assessed    Lower Extremity Assessment Lower Extremity Assessment: Overall WFL for tasks assessed  Cervical / Trunk Assessment Cervical / Trunk Assessment: Normal  Communication   Communication: No difficulties  Cognition Arousal/Alertness: Awake/alert Behavior During Therapy: WFL for tasks assessed/performed Overall Cognitive Status: Within Functional Limits for tasks assessed                                          General Comments      Exercises     Assessment/Plan    PT Assessment All further PT needs can be met in the next venue of care  PT  Problem List Decreased activity tolerance;Decreased mobility       PT Treatment Interventions      PT Goals (Current goals can be found in the Care Plan section)  Acute Rehab PT Goals Patient Stated Goal: to figure out what is going on PT Goal Formulation: With patient Time For Goal Achievement: 04/11/22 Potential to Achieve Goals: Good    Frequency       Co-evaluation               AM-PAC PT "6 Clicks" Mobility  Outcome Measure Help needed turning from your back to your side while in a flat bed without using bedrails?: None Help needed moving from lying on your back to sitting on the side of a flat bed without using bedrails?: None Help needed moving to and from a bed to a chair (including a wheelchair)?: None Help needed standing up from a chair using your arms (e.g., wheelchair or bedside chair)?: None Help needed to walk in hospital room?: None Help needed climbing 3-5 steps with a railing? : None 6 Click Score: 24    End of Session Equipment Utilized During Treatment: Gait belt Activity Tolerance: Patient tolerated treatment well Patient left: in bed;with call bell/phone within reach (sitting EOB in ED on stretcher) Nurse Communication: Mobility status PT Visit Diagnosis: Muscle weakness (generalized) (M62.81)    Time: 1700-1749 PT Time Calculation (min) (ACUTE ONLY): 16 min   Charges:   PT Evaluation $PT Eval Low Complexity: 1 Low          Lou Miner, DPT  Acute Rehabilitation Services  Office: [phone]   Rudean Hitt 04/11/2022, 3:22 PM

## 2022-04-11 NOTE — Progress Notes (Signed)
Pt with great effort and technique did -40 on the NIF.

## 2022-04-12 ENCOUNTER — Telehealth: Payer: Self-pay | Admitting: Internal Medicine

## 2022-04-12 ENCOUNTER — Telehealth: Payer: Self-pay

## 2022-04-12 NOTE — Telephone Encounter (Signed)
Patient would like for you to look over his test results from his recent visit to [facility] and then let him know if he should come in to see you for a recheck. He was not admitted but spent overnight in the ER.

## 2022-04-12 NOTE — Telephone Encounter (Signed)
Patient is calling in stating he wants to discuss results with Dr.Jones from the hospital. No openings but patient would like to be seen next week if possible.

## 2022-04-13 LAB — TESTOSTERONE: Testosterone: 132 ng/dL — ABNORMAL LOW (ref 264–916)

## 2022-04-13 NOTE — Progress Notes (Unsigned)
Initial neurology clinic note  SERVICE DATE: 04/16/22  Reason for Evaluation: Consultation requested by Tegeler, Gwenyth Allegra,* for an opinion regarding generalized weakness. My final recommendations will be communicated back to the requesting physician by way of shared medical record or letter to requesting physician via [place] mail.  HPI: This is Mr. Marcus Crawford, a 71 y.o. right-handed male with a medical history of HTN, HLD, CKD, pre-diabetes, vit D deficiency, prostate cancer, bursitis of right hip, RBBB, Bell's Palsy c/b chronic right face droop, hypogonadism, and fatigue who presents to neurology clinic with the chief complaint of weakness. The patient is alone today.  Patient has been having progressive weakness since May or June of 2023. He first noticed that when he did anything, he would get tired very quickly. He denies weakness at first, but just wouldn't have energy to go very long. He would have to rest to get his energy up again. He may have started noticing his stride while walking was changing for the last year. He is not sure how he was walking different, but noticed he was.  Patient feels weak in his arms and legs. He mentioned that about 2 months ago he noticed leg weakness and 1 month ago he started noticing his arms felt funny and weak. He does not notice fluctuating of his weakness. He feels his strength is so poor and his fatigue is so much that he ends up laying around than being upright. He has lost 15-20 pounds over this time period as well. He says he has no appetite. He does not feel he can think straight. He feels like he has brain fog. He also feels like his vision is affected. He has dry eyes and feels like his vision is poor. He denies double vision. He also feels like he cannot sit still. He is restless. He mentions that his hand writing is poorer than previous and perhaps smaller.  He is sleeping very poorly. He takes ambien to help him sleep but he is still only  sleeping from 9:30pm to 3:30am (~6 hours per night). He feels rested but only for a short period of time. This has been going on for about 2 months. He thinks he snores a lot. He had a sleep study scheduled a couple of weeks ago, but this was not completed because per patient, he wasn't in good enough health, so it was cancelled.   He endorses poor mood as well. His is upset about not only his condition but also his wife. She is going through cancer treatments.  Patient states he feels so bad that he feels like he is dying.  Current MG like symptoms: Ptosis: He thinks he occasionally has droopy eyelids, but maybe this has been going on for a long time. Double vision: Not clear Speech: Patient feels like his voice is changing. He feels hoarse. This comes and goes. He thinks it is worse when he gets upset or if he is tired or using his voice a lot. Chewing: None Swallowing: None Breathing: None Arm strength: Feels weak. Harder to brush teeth. Not dropping things.  Leg strength: Feels weak. Feels like he is taking shorter chopper steps per patient.   He currently takes vitamin D 1000 IU per day for several years. He takes no other vitamins.  The patient has not had similar episodes of symptoms in the past.   He endorses some tingling on the bottom of his feet occasionally and has occasional cramping.   Any  change in urine color, especially after exertion/physical activity? No  Pseudobulbar affect is absent, but patient does endorse being more emotional.  The patient has not  noticed any recent skin rashes nor does he report any constitutional symptoms like fever, night sweats. He endorses anorexia and weight loss as above.  EtOH use: None  Restrictive diet? No Family history of neuropathy/myopathy/NM disease? None  Patient presented to the ED on 04/10/22 for weakness. Per documentation, symptoms have been present for the last year, but progressed over the last month. He has lost 15  pounds over the last few months due to poor appetite. He feels very fatigued. There was concern for myasthenia gravis, so neurology was consulted. Neurology was not concerned for MG crisis, so sent the antibodies and recommended outpatient follow up.   MEDICATIONS:  Outpatient Encounter Medications as of 04/16/2022  Medication Sig Note   cholecalciferol (VITAMIN D) 1000 UNITS tablet Take 1,000 Units by mouth daily.     hypromellose (SYSTANE OVERNIGHT THERAPY) 0.3 % GEL ophthalmic ointment Place 1 Application into both eyes at bedtime.    levocetirizine (XYZAL) 5 MG tablet TAKE 1 TABLET(5 MG) BY MOUTH EVERY EVENING (Patient taking differently: Take 5 mg by mouth daily as needed for allergies.)    Polyvinyl Alcohol-Povidone (REFRESH OP) Place 1 drop into both eyes 4 (four) times daily as needed (dryness).    pravastatin (PRAVACHOL) 10 MG tablet TAKE 1 TABLET(10 MG) BY MOUTH DAILY (Patient taking differently: Take 10 mg by mouth at bedtime.)    RABEprazole (ACIPHEX) 20 MG tablet TAKE ONE TABLET BY MOUTH ONE TIME DAILY (Patient taking differently: Take 20 mg by mouth every other day.) 04/11/2022: Pt is adamant he is taking this medication every other day. Dispense report does not support this claim.    tadalafil (CIALIS) 20 MG tablet Take 20 mg by mouth daily as needed for erectile dysfunction.    zolpidem (AMBIEN) 10 MG tablet Take 1 tablet (10 mg total) by mouth at bedtime as needed for sleep.    Testosterone 1.62 % GEL Place onto the skin. (Patient not taking: Reported on 04/11/2022)    No facility-administered encounter medications on file as of 04/16/2022.    PAST MEDICAL HISTORY: Past Medical History:  Diagnosis Date   Anemia    early 20's   Bell's palsy 1977   Blood clot in vein 2005   left leg "blood clot"-treated as OP; no residual   Blood transfusion without reported diagnosis    with appendectomy   Borderline diabetic    no meds   Cancer ([place]) 08/2013   prostate/ had surgery/  no chemo or radiation   Cataract    forming   CKD (chronic kidney disease) stage 3, GFR 30-59 ml/min (HCC) saw dr Justin Mend 6 months ago   on rampiril for kidney function also   Colonic polyp    X3 ,hyperplastic   Diverticulosis of colon    GERD (gastroesophageal reflux disease)    H/O hiatal hernia    Hemorrhoid    Hyperlipidemia    Hypertension    past hx - no meds since 2013 with  adrenal gland surgery   Kidney stone on left side 2013   asymptomatic , incidental finding   Migraine last 6-7 yrs ago   PMH of ; resolved post adrenal adenoma resection   Nephrolithiasis 2013   kidney stone  as incidental finding on imaging   PONV (postoperative nausea and vomiting)     PAST SURGICAL HISTORY: Past  Surgical History:  Procedure Laterality Date   adrenal venous sampling  03-2012   ADRENALECTOMY  04/14/2012   Procedure: ADRENALECTOMY;  Surgeon: Ralene Ok, MD;  Location: [facility];  Service: General;  Laterality: Left;  Left Adrenal Excision   APPENDECTOMY  1985   COLONOSCOPY     last 9/12, Dr Fuller Plan; due 2017   ESOPHAGEAL DILATION  2001   Dr Velora Heckler   POLYPECTOMY      X 3   ROBOT ASSISTED LAPAROSCOPIC RADICAL PROSTATECTOMY N/A 09/07/2013   Procedure: ROBOTIC ASSISTED LAPAROSCOPIC RADICAL PROSTATECTOMY LEVEL 1;  Surgeon: Dutch Gray, MD;  Location: [facility];  Service: Urology;  Laterality: N/A;    ALLERGIES: Allergies  Allergen Reactions   Penicillins Rash    Rash (RN clarified with pt) No SOB/swelling   Sulfa Antibiotics Rash   Aspirin Other (See Comments)    Unknown     FAMILY HISTORY: Family History  Problem Relation Age of Onset   Rectal cancer Mother    Colon cancer Mother 2   Heart attack Mother 38   Colon polyps Sister    Lung cancer Sister        smoker   Diabetes Sister    Breast cancer Sister    Alzheimer's disease Sister    Heart failure Brother    Kidney cancer Brother    Prostate cancer Brother 42   Heart disease Brother    Hypertension Brother     Kidney cancer Brother    Diabetes Brother    Heart attack Brother 78   Esophageal cancer Maternal Uncle    Kidney cancer Maternal Uncle    Stomach cancer Neg Hx     SOCIAL HISTORY: Social History   Tobacco Use   Smoking status: Never   Smokeless tobacco: Never  Vaping Use   Vaping Use: Never used  Substance Use Topics   Alcohol use: Not Currently    Comment: 1 every 3 mos   Drug use: No   Social History   Social History Narrative   Right handed      OBJECTIVE: PHYSICAL EXAM: BP (!) 150/92   Pulse 70   Ht 6' (1.829 m)   Wt 205 lb 9.6 oz (93.3 kg)   SpO2 96%   BMI 27.88 kg/m   General: General appearance: Awake and alert. No distress. Cooperative with exam.  Skin: No obvious rash or jaundice. HEENT: Atraumatic. Anicteric. Lungs: Non-labored breathing on room air  Extremities: No edema. No obvious deformity.  Musculoskeletal: No obvious joint swelling. Psych: Flat affect.  Neurological: (Patient examined in hospital gown) Mental Status: Alert. Speech fluent. No pseudobulbar affect Cranial Nerves: CNII: No RAPD. Visual fields grossly intact. CNIII, IV, VI: PERRL. No nystagmus. EOMI. CN V: Facial sensation intact bilaterally to fine touch. Masseter clench strong. Jaw jerk negative. CN VII: Eye closure strong bilaterally. Right face droop (chronic from Bell's Palsy). Mild bilateral ptosis at rest but no change after sustained upgaze. Cogan lid twitch sign negative CN VIII: Hearing grossly intact bilaterally. CN IX: No hypophonia. CN X: Palate elevates symmetrically. CN XI: Full strength shoulder shrug bilaterally. CN XII: Tongue protrusion full and midline. No atrophy or fasciculations. No significant dysarthria Motor: Tone is normal. No fasciculations in any extremities. No significant atrophy.  Individual muscle group testing (MRC grade out of 5):  Movement     Neck flexion 5    Neck extension 5     Right Left   Shoulder abduction 5 5   Shoulder  adduction 5 5   Shoulder ext rotation 5 5   Shoulder int rotation 5 5   Elbow flexion 5 5   Elbow extension 5 5   Wrist extension 5 5   Wrist flexion 5 5   Finger abduction - FDI 5 5   Finger abduction - ADM 5 5   Finger extension 5 5   Finger distal flexion - 2/'3 5 5   '$ Finger distal flexion - 4/'5 5 5   '$ Thumb flexion - FPL 5 5   Thumb abduction - APB 5 5    Hip flexion 5 5   Hip extension 5 5   Hip adduction 5 5   Hip abduction 5 5   Knee extension 5 5   Knee flexion 5 5   Dorsiflexion 5 5   Plantarflexion 5 5   Inversion 5 5   Eversion 5 5   Great toe extension 5 5   Great toe flexion 5 5     Reflexes:  Right Left   Bicep 2+ 2+   Tricep 2+ 2+   BrRad 2+ 2+   Knee 2+ 2+   Ankle 2+ 2+    Pathological Reflexes: Babinski: flexor response bilaterally Hoffman: absent bilaterally Troemner: absent bilaterally Palmomental: absent bilaterally Facial: absent bilaterally Midline tap: absent Sensation: Pinprick: Intact in all extremities Vibration: Intact in bilateral upper extremities. Diminished in bilateral great toes Temperature: Intact in all extremities Proprioception: Intact in bilateral great toes. Coordination: Intact finger-to- nose-finger bilaterally. Romberg negative. Gait: Able to rise from chair with arms crossed unassisted. Normal, narrow-based gait. Able to tandem walk. Able to walk on toes and heels.  Lab and Test Review: Internal labs: Normal or unremarkable: CK (325), Mg, TSH, free T4, CBC, Vit D Testosterone: low at 132 (264 is lower limit of normal) BMP with mildly elevated glucose, elevated Cr (1.41) HbA1c: 6.0 B12: 409 AChR ab: pending Striated abs: pending   MRI brain wo contrast (04/11/22): FINDINGS: Brain: No restricted diffusion to suggest acute or subacute infarct. No acute hemorrhage, mass, mass effect, midline shift. No hydrocephalus or extra-axial collection. No hemosiderin deposition to suggest remote hemorrhage. Scattered T2  hyperintense signal in the periventricular white matter, likely the sequela of mild chronic small vessel ischemic disease. Cerebral volume is within normal limits for age.   Vascular: Normal arterial flow voids.   Skull and upper cervical spine: Normal marrow signal.   Sinuses/Orbits: No acute finding.   Other: The mastoids are well aerated.   IMPRESSION: No acute intracranial process. No evidence of acute or subacute infarct.  MRI cervical spine wo contrast (04/11/22): FINDINGS: Alignment: Normal   Vertebrae: No fracture, evidence of discitis, or bone lesion.T1 hemangioma.   Cord: Normal signal and morphology.   Posterior Fossa, vertebral arteries, paraspinal tissues: Negative.   Disc levels:   C1-2: Unremarkable.   C2-3: Normal disc space and facet joints. There is no spinal canal stenosis. No neural foraminal stenosis.   C3-4: Small disc bulge with mild facet hypertrophy. There is no spinal canal stenosis. Mild bilateral neural foraminal stenosis.   C4-5: Moderate right and mild left facet hypertrophy. There is no spinal canal stenosis. No neural foraminal stenosis.   C5-6: Normal disc space and facet joints. There is no spinal canal stenosis. No neural foraminal stenosis.   C6-7: Small disc bulge with endplate spurring. There is no spinal canal stenosis. No neural foraminal stenosis.   C7-T1: Normal disc space and facet joints. There is no spinal canal  stenosis. No neural foraminal stenosis.   IMPRESSION: 1. No acute abnormality of the cervical spine. 2. Mild bilateral neural foraminal stenosis at C3-4. 3. No spinal canal stenosis.  ASSESSMENT: Marcus Crawford is a 71 y.o. male who presents for evaluation of subjective weakness, fatigue, brain fog, unwell feeling. He has a relevant medical history of HTN, HLD, CKD, pre-diabetes, vit D deficiency, prostate cancer, bursitis of right hip, RBBB, Bell's Palsy c/b chronic right face droop, hypogonadism, and  fatigue. His neurological examination is pertinent for chronic right face droop, but otherwise remarkable. Patient's strength is intact, reflexes normal, and no signs of diplopia, clear ptosis, or fatigable weakness. Available diagnostic data is significant for normal B12, vit D, and TSH. Testosterone was low and HbA1c was 6.0. The etiology of patient's subjective weakness and fatigue is currently unclear. There is no clear signs of myasthenia gravis, but AChR abs are pending, so will follow up on this. If other work up is unrevealing, may discuss EMG with [facility]NS, but I am not sure this would be of great yield.   Given his poor appetite, weight loss, vision changes, brain fog, thiamine deficiency is a possible concern. I will check B1 today and suggested patient empirically start thiamine ASAP in case of deficiency as below. Other possible considerations are sleep and mood. Patient appears exhausted during history and physical, at times having difficulty keeping his eyes open as if to sleep. Patient also has flat affect. Given the stress of his wife's cancer, depression is another consideration given his down mood, poor sleep, poor appetite, fatigue. This should be further explored. I will send a message to patient's PCP for further consideration of these.  PLAN: -Follow up on AChR abs -Blood work: B1 -Start empiric thiamine supplementation 100 mg daily -Will message PCP about patient's possible mood and sleep disorders -May consider EMG, but given normal exam, unclear the yield -Continue Vit D supplementation  -Return to clinic in 1 month  The impression above as well as the plan as outlined below were extensively discussed with the patient who voiced understanding. All questions were answered to their satisfaction.  When available, results of the above investigations and possible further recommendations will be communicated to the patient via telephone/MyChart. Patient to call office if not contacted  after expected testing turnaround time.   Total time spent reviewing records, interview, history/exam, documentation, and coordination of care on day of encounter:  80 min   Thank you for allowing me to participate in patient's care.  If I can answer any additional questions, I would be pleased to do so.  Kai Levins, MD   CC: Janith Lima, MD [place] [id number]  CC: Referring provider: Tegeler, Gwenyth Allegra, MD [address] [place],  [place] [id number]

## 2022-04-16 ENCOUNTER — Ambulatory Visit (INDEPENDENT_AMBULATORY_CARE_PROVIDER_SITE_OTHER): Payer: Medicare Other | Admitting: Neurology

## 2022-04-16 ENCOUNTER — Encounter: Payer: Self-pay | Admitting: Neurology

## 2022-04-16 VITALS — BP 150/92 | HR 70 | Ht 72.0 in | Wt 205.6 lb

## 2022-04-16 DIAGNOSIS — R5383 Other fatigue: Secondary | ICD-10-CM

## 2022-04-16 DIAGNOSIS — Z72820 Sleep deprivation: Secondary | ICD-10-CM

## 2022-04-16 DIAGNOSIS — F32A Depression, unspecified: Secondary | ICD-10-CM | POA: Diagnosis not present

## 2022-04-16 DIAGNOSIS — R4589 Other symptoms and signs involving emotional state: Secondary | ICD-10-CM | POA: Diagnosis not present

## 2022-04-16 DIAGNOSIS — R531 Weakness: Secondary | ICD-10-CM

## 2022-04-16 NOTE — Patient Instructions (Addendum)
I saw you today for fatigue and generalized weakness. There was concern for myasthenia gravis. I am not sure this is what you have. You have some tests pending, so I will follow up on this.  I want to check on vitamin today (vitamin B1).  I want you to start taking B1 (thiamine) 100 mg every day.  I will be in touch with your primary care doctor to discuss my thoughts.  I would like to see you back in 1 month in clinic. I will be in touch when I have your results.  Please let me know if you have any questions or concerns in the meantime.   The physicians and staff at [facility] are committed to providing excellent care. You may receive a survey requesting feedback about your experience at our office. We strive to receive "very good" responses to the survey questions. If you feel that your experience would prevent you from giving the office a "very good " response, please contact our office to try to remedy the situation. We may be reached at [phone]. Thank you for taking the time out of your busy day to complete the survey.  Kai Levins, MD [facility]

## 2022-04-16 NOTE — Telephone Encounter (Signed)
Patient has an appointment with neurologist and would like to see what he says before scheduling.

## 2022-04-17 ENCOUNTER — Telehealth: Payer: Self-pay

## 2022-04-17 NOTE — Telephone Encounter (Signed)
Patient is calling in stating he seen Dr.Sagardia on 10/3. Was prescribed Ambien, took it last night for the first time and ended up not sleeping much at all wondered if there was something else that can be called in.

## 2022-04-18 ENCOUNTER — Other Ambulatory Visit: Payer: Self-pay | Admitting: Internal Medicine

## 2022-04-18 ENCOUNTER — Other Ambulatory Visit (INDEPENDENT_AMBULATORY_CARE_PROVIDER_SITE_OTHER): Payer: Medicare Other

## 2022-04-18 DIAGNOSIS — R531 Weakness: Secondary | ICD-10-CM | POA: Diagnosis not present

## 2022-04-18 DIAGNOSIS — F322 Major depressive disorder, single episode, severe without psychotic features: Secondary | ICD-10-CM

## 2022-04-18 DIAGNOSIS — R5383 Other fatigue: Secondary | ICD-10-CM | POA: Diagnosis not present

## 2022-04-18 MED ORDER — VORTIOXETINE HBR 5 MG PO TABS: 5.0000 mg | ORAL_TABLET | Freq: Every day | ORAL | 0 refills | Status: DC

## 2022-04-18 NOTE — Telephone Encounter (Signed)
Called patient and informed him that his medication was sent to his pharmacy

## 2022-04-18 NOTE — Telephone Encounter (Signed)
Called patient and he mentioned that he wants to start treatment for depression

## 2022-04-18 NOTE — Telephone Encounter (Signed)
Called patient and left message with patient wife for him to call back to office  in reference to his medication issue

## 2022-04-18 NOTE — Telephone Encounter (Signed)
Patient is returning a call back.  

## 2022-04-19 ENCOUNTER — Other Ambulatory Visit: Payer: Self-pay | Admitting: Internal Medicine

## 2022-04-19 DIAGNOSIS — F322 Major depressive disorder, single episode, severe without psychotic features: Secondary | ICD-10-CM

## 2022-04-19 MED ORDER — DULOXETINE HCL 30 MG PO CPEP: 30.0000 mg | ORAL_CAPSULE | Freq: Every day | ORAL | 0 refills | Status: DC

## 2022-04-19 MED ORDER — BUPROPION HCL ER (XL) 150 MG PO TB24: 150.0000 mg | ORAL_TABLET | Freq: Every day | ORAL | 0 refills | Status: DC

## 2022-04-19 NOTE — Telephone Encounter (Signed)
Patient went to go pick up prescription but it was over $400, wondered if there is a cheaper option.

## 2022-04-20 LAB — ACETYLCHOLINE RECEPTOR, BINDING: A CHR BINDING ABS: <0.3 nmol/L

## 2022-04-20 LAB — THYROID PANEL WITH TSH
Free Thyroxine Index: 2.2 (ref 1.4–3.8)
T3 Uptake: 36 % — ABNORMAL HIGH (ref 22–35)
T4, Total: 6.2 ug/dL (ref 4.9–10.5)
TSH: 1.93 mIU/L (ref 0.40–4.50)

## 2022-04-20 LAB — STRIATED MUSCLE ANTIBODY: STRIATED MUSCLE AB SCREEN: NEGATIVE

## 2022-04-20 NOTE — Telephone Encounter (Signed)
-----   Message from Shellia Carwin, MD sent at 04/20/2022  7:40 AM EDT ----- Regarding: Acetylcholine receptor antibodies Joseph Art,  This patient had this lab drawn (acetylcholine receptor antibodies) on 04/10/22 (it says in Epic under labs, "collected"). There is still no result. I am afraid this may not actually be pending or there could be a problem. Could you call the lab and see if they for sure have the lab and that it is pending? It seems like it has been too long and should have resulted by now.  Thank you,  Ysidro Evert

## 2022-04-23 ENCOUNTER — Telehealth: Payer: Self-pay | Admitting: Neurology

## 2022-04-23 LAB — VITAMIN B1: Vitamin B1 (Thiamine): 45 nmol/L — ABNORMAL HIGH (ref 8–30)

## 2022-04-23 NOTE — Telephone Encounter (Signed)
Patient called and left a message requesting a call back for lab results from last week.  He also wants to see if reports have been received.

## 2022-04-23 NOTE — Telephone Encounter (Signed)
Returned patient's call. He is feeling intermittently weak still and wondering about lab results, particularly an elevated B1. I explained that this would not be causing his symptoms, and that I was looking for a B1 deficiency when ordering the test.  His AChR binding abs and striated muscle abs were negative.  Given patient's concern and ongoing weakness, I will bring the patient back to clinic tomorrow (04/24/21) at 10:30am to re-evaluate.  All questions were answered.  Kai Levins, MD [facility]

## 2022-04-23 NOTE — Progress Notes (Signed)
I saw Marcus Crawford in neurology clinic on 04/24/22 in follow up for weakness.  HPI: Marcus Crawford is a 71 y.o. year old male with a history of HTN, HLD, CKD, pre-diabetes, vit D deficiency, prostate cancer, bursitis of right hip, RBBB, Bell's Palsy c/b chronic right face droop, hypogonadism, and fatigue who we last saw on 04/16/22.  To briefly review: Patient has been having progressive weakness since May or June of 2023. He first noticed that when he did anything, he would get tired very quickly. He denies weakness at first, but just wouldn't have energy to go very long. He would have to rest to get his energy up again. He may have started noticing his stride while walking was changing for the last year. He is not sure how he was walking different, but noticed he was.   Patient feels weak in his arms and legs. He mentioned that about August 2023 he noticed leg weakness and September 2023 he started noticing his arms felt funny and weak. He does not notice fluctuating of his weakness. He feels his strength is so poor and his fatigue is so much that he ends up laying around than being upright. He has lost 15-20 pounds over this time period as well. He says he has no appetite. He does not feel he can think straight. He feels like he has brain fog. He also feels like his vision is affected. He has dry eyes and feels like his vision is poor. He denies double vision. He also feels like he cannot sit still. He is restless. He mentions that his hand writing is poorer than previous and perhaps smaller.   He is sleeping very poorly. He takes ambien to help him sleep but he is still only sleeping from 9:30pm to 3:30am (~6 hours per night). He feels rested but only for a short period of time. This has been going on for about 2 months. He thinks he snores a lot. He had a sleep study scheduled a couple of weeks ago, but this was not completed because per patient, he wasn't in good enough health, so it was  cancelled.    He endorses poor mood as well. His is upset about not only his condition but also his wife. She is going through cancer treatments.   Patient states he feels so bad that he feels like he is dying.   Current MG like symptoms: Ptosis: He thinks he occasionally has droopy eyelids, but maybe this has been going on for a long time. Double vision: Not clear Speech: Patient feels like his voice is changing. He feels hoarse. This comes and goes. He thinks it is worse when he gets upset or if he is tired or using his voice a lot. Chewing: None Swallowing: None Breathing: None Arm strength: Feels weak. Harder to brush teeth. Not dropping things.  Leg strength: Feels weak. Feels like he is taking shorter chopper steps per patient.    He currently takes vitamin D 1000 IU per day for several years. He takes no other vitamins.   The patient has not had similar episodes of symptoms in the past.    He endorses some tingling on the bottom of his feet occasionally and has occasional cramping.    Any change in urine color, especially after exertion/physical activity? No   Pseudobulbar affect is absent, but patient does endorse being more emotional.   The patient has not  noticed any recent skin rashes nor does  he report any constitutional symptoms like fever, night sweats. He endorses anorexia and weight loss as above.   EtOH use: None  Restrictive diet? No Family history of neuropathy/myopathy/NM disease? None   Patient presented to the ED on 04/10/22 for weakness. Per documentation, symptoms have been present for the last year, but progressed over the last month. He has lost 15 pounds over the last few months due to poor appetite. He feels very fatigued. There was concern for myasthenia gravis, so neurology was consulted. Neurology was not concerned for MG crisis, so sent the antibodies and recommended outpatient follow up.  04/16/22: PLAN: -Follow up on AChR abs -Blood work:  B1 -Start empiric thiamine supplementation 100 mg daily -Will message PCP about patient's possible mood and sleep disorders -May consider EMG, but given normal exam, unclear the yield -Continue Vit D supplementation  Since their last visit: AchR binding abs and striated muscle abs were negative. B1 was elevated to 45.  Patient continues to feel very fatigued and weak. We spoke by phone on 04/23/22, and patient was very concerned about his symptoms, so this follow up appointment was made.  Patient today says he does not feel depressed. He was prescribed a medication for depression but did not take it.  He also has poor sleep. He was given [place] which he took last night and slept 7 hours. He is worried about getting addicted to the medication though.   MEDICATIONS:  Outpatient Encounter Medications as of 04/24/2022  Medication Sig Note   buPROPion (WELLBUTRIN XL) 150 MG 24 hr tablet Take 1 tablet (150 mg total) by mouth daily.    cholecalciferol (VITAMIN D) 1000 UNITS tablet Take 1,000 Units by mouth daily.     DULoxetine (CYMBALTA) 30 MG capsule Take 1 capsule (30 mg total) by mouth daily.    hypromellose (SYSTANE OVERNIGHT THERAPY) 0.3 % GEL ophthalmic ointment Place 1 Application into both eyes at bedtime.    levocetirizine (XYZAL) 5 MG tablet TAKE 1 TABLET(5 MG) BY MOUTH EVERY EVENING (Patient taking differently: Take 5 mg by mouth daily as needed for allergies.)    Polyvinyl Alcohol-Povidone (REFRESH OP) Place 1 drop into both eyes 4 (four) times daily as needed (dryness).    pravastatin (PRAVACHOL) 10 MG tablet TAKE 1 TABLET(10 MG) BY MOUTH DAILY (Patient taking differently: Take 10 mg by mouth at bedtime.)    RABEprazole (ACIPHEX) 20 MG tablet TAKE ONE TABLET BY MOUTH ONE TIME DAILY (Patient taking differently: Take 20 mg by mouth every other day.) 04/11/2022: Pt is adamant he is taking this medication every other day. Dispense report does not support this claim.    tadalafil  (CIALIS) 20 MG tablet Take 20 mg by mouth daily as needed for erectile dysfunction.    Testosterone 1.62 % GEL Place onto the skin.    zolpidem (AMBIEN) 10 MG tablet Take 1 tablet (10 mg total) by mouth at bedtime as needed for sleep.    No facility-administered encounter medications on file as of 04/24/2022.    PAST MEDICAL HISTORY: Past Medical History:  Diagnosis Date   Anemia    early 20's   Bell's palsy 1977   Blood clot in vein 2005   left leg "blood clot"-treated as OP; no residual   Blood transfusion without reported diagnosis    with appendectomy   Borderline diabetic    no meds   Cancer ([place]) 08/2013   prostate/ had surgery/ no chemo or radiation   Cataract    forming  CKD (chronic kidney disease) stage 3, GFR 30-59 ml/min (HCC) saw dr webb 6 months ago   on rampiril for kidney function also   Colonic polyp    X3 ,hyperplastic   Diverticulosis of colon    GERD (gastroesophageal reflux disease)    H/O hiatal hernia    Hemorrhoid    Hyperlipidemia    Hypertension    past hx - no meds since 2013 with  adrenal gland surgery   Kidney stone on left side 2013   asymptomatic , incidental finding   Migraine last 6-7 yrs ago   PMH of ; resolved post adrenal adenoma resection   Nephrolithiasis 2013   kidney stone  as incidental finding on imaging   PONV (postoperative nausea and vomiting)     PAST SURGICAL HISTORY: Past Surgical History:  Procedure Laterality Date   adrenal venous sampling  03-2012   ADRENALECTOMY  04/14/2012   Procedure: ADRENALECTOMY;  Surgeon: Ralene Ok, MD;  Location: [facility];  Service: General;  Laterality: Left;  Left Adrenal Excision   APPENDECTOMY  1985   COLONOSCOPY     last 9/12, Dr Fuller Plan; due 2017   ESOPHAGEAL DILATION  2001   Dr Velora Heckler   POLYPECTOMY      X 3   ROBOT ASSISTED LAPAROSCOPIC RADICAL PROSTATECTOMY N/A 09/07/2013   Procedure: ROBOTIC ASSISTED LAPAROSCOPIC RADICAL PROSTATECTOMY LEVEL 1;  Surgeon: Dutch Gray, MD;   Location: [facility];  Service: Urology;  Laterality: N/A;    ALLERGIES: Allergies  Allergen Reactions   Penicillins Rash    Rash (RN clarified with pt) No SOB/swelling   Sulfa Antibiotics Rash   Aspirin Other (See Comments)    Unknown     FAMILY HISTORY: Family History  Problem Relation Age of Onset   Rectal cancer Mother    Colon cancer Mother 52   Heart attack Mother 75   Colon polyps Sister    Lung cancer Sister        smoker   Diabetes Sister    Breast cancer Sister    Alzheimer's disease Sister    Heart failure Brother    Kidney cancer Brother    Prostate cancer Brother 5   Heart disease Brother    Hypertension Brother    Kidney cancer Brother    Diabetes Brother    Heart attack Brother 78   Esophageal cancer Maternal Uncle    Kidney cancer Maternal Uncle    Stomach cancer Neg Hx     SOCIAL HISTORY: Social History   Tobacco Use   Smoking status: Never   Smokeless tobacco: Never  Vaping Use   Vaping Use: Never used  Substance Use Topics   Alcohol use: Not Currently    Comment: 1 every 3 mos   Drug use: No   Social History   Social History Narrative   Right handed     Objective:  Vital Signs:  BP (!) 145/86   Pulse 82   Wt 206 lb (93.4 kg)   SpO2 97%   BMI 27.94 kg/m   General: General appearance: Awake and alert. No distress. Cooperative with exam. Flat affect Skin: No rash or jaundice. HEENT: Atraumatic. Anicteric. Lungs: Non-labored breathing on room air  Extremities: No edema. No obvious deformity.  Musculoskeletal: No obvious joint swelling.  Neurological: Mental Status: Alert. Speech fluent. No pseudobulbar affect Cranial Nerves: CNII: No RAPD. Visual fields intact. CNIII, IV, VI: PERRL. No nystagmus. EOMI. CN V: Facial sensation intact bilaterally to fine touch. CN VII:  Facial muscles symmetric and strong. ?ptosis at rest that does not change with sustained up gaze. CN VIII: Hears finger rub well bilaterally. CN IX: No  hypophonia. CN X: Palate elevates symmetrically. CN XI: Full strength shoulder shrug bilaterally. CN XII: Tongue protrusion full and midline. No atrophy or fasciculations. No significant dysarthria Motor: Tone is normal.  Individual muscle group testing (MRC grade out of 5):  Movement     Neck flexion 5    Neck extension 5     Right Left   Shoulder abduction 5 5   Shoulder adduction 5 5   Shoulder ext rotation 5 5   Shoulder int rotation 5 5   Elbow flexion 5 5   Elbow extension 5 5   Wrist extension 5 5   Wrist flexion 5 5   Finger abduction - FDI 5 5   Finger abduction - ADM 5 5   Finger extension 5 5   Finger distal flexion - 2/'3 5 5   '$ Finger distal flexion - 4/'5 5 5   '$ Thumb flexion - FPL 5 5   Thumb abduction - APB 5 5    Hip flexion 5 5   Hip extension 5 5   Hip adduction 5 5   Hip abduction 5 5   Knee extension 5 5   Knee flexion 5 5   Dorsiflexion 5 5   Plantarflexion 5 5   Inversion 5 5   Eversion 5 5   Great toe extension 5 5   Great toe flexion 5 5     Reflexes:  Right Left  Bicep 2+ 2+  Tricep 2+ 2+  BrRad 2+ 2+  Knee 2+ 2+  Ankle 2+ 2+   Sensation: Intact in all extremities Coordination: Intact finger-to- nose-finger bilaterally. Romberg negative. Gait: Able to rise from chair with arms crossed unassisted. Normal, narrow-based gait.   Lab and Test Review: New results: B1: 45 AChR binding abs: negative Striated muscle abs: negative  Previously reviewed results: Internal labs: Normal or unremarkable: CK (325), Mg, TSH, free T4, CBC, Vit D Testosterone: low at 132 (264 is lower limit of normal) BMP with mildly elevated glucose, elevated Cr (1.41) HbA1c: 6.0 B12: 409    MRI brain wo contrast (04/11/22): FINDINGS: Brain: No restricted diffusion to suggest acute or subacute infarct. No acute hemorrhage, mass, mass effect, midline shift. No hydrocephalus or extra-axial collection. No hemosiderin deposition to suggest remote hemorrhage.  Scattered T2 hyperintense signal in the periventricular white matter, likely the sequela of mild chronic small vessel ischemic disease. Cerebral volume is within normal limits for age.   Vascular: Normal arterial flow voids.   Skull and upper cervical spine: Normal marrow signal.   Sinuses/Orbits: No acute finding.   Other: The mastoids are well aerated.   IMPRESSION: No acute intracranial process. No evidence of acute or subacute infarct.   MRI cervical spine wo contrast (04/11/22): FINDINGS: Alignment: Normal   Vertebrae: No fracture, evidence of discitis, or bone lesion.T1 hemangioma.   Cord: Normal signal and morphology.   Posterior Fossa, vertebral arteries, paraspinal tissues: Negative.   Disc levels:   C1-2: Unremarkable.   C2-3: Normal disc space and facet joints. There is no spinal canal stenosis. No neural foraminal stenosis.   C3-4: Small disc bulge with mild facet hypertrophy. There is no spinal canal stenosis. Mild bilateral neural foraminal stenosis.   C4-5: Moderate right and mild left facet hypertrophy. There is no spinal canal stenosis. No neural foraminal stenosis.  C5-6: Normal disc space and facet joints. There is no spinal canal stenosis. No neural foraminal stenosis.   C6-7: Small disc bulge with endplate spurring. There is no spinal canal stenosis. No neural foraminal stenosis.   C7-T1: Normal disc space and facet joints. There is no spinal canal stenosis. No neural foraminal stenosis.   IMPRESSION: 1. No acute abnormality of the cervical spine. 2. Mild bilateral neural foraminal stenosis at C3-4. 3. No spinal canal stenosis.  ASSESSMENT: This is Marcus Crawford, a 71 y.o. male here for follow up of extreme fatigue, subjective weakness, and poor sleep and mood. Again today I find no evidence of objective weakness. His AChR binding abs were negative, which argues against myasthenia gravis. Patient continues to feel like he is dying though,  so I will expand my work up to include the rest of the MG antibodies and an EMG with repetitive nerve stimulation. My feeling is that his symptoms are more related to poor sleep and mood.  Plan: -Lab work: AChR abs (blocking and modulating), MuSK abs -EMG w/ RNS (R > L) -Sleep medicine referral for poor sleep  Return to clinic in after EMG  Total time spent reviewing records, interview, history/exam, documentation, and coordination of care on day of encounter:  67 min  Kai Levins, MD

## 2022-04-23 NOTE — Telephone Encounter (Signed)
Patient left message with the after hour service on 04-23-22 at 12:00 pm  Caller states so weak and he has hard time standing, talking and saw DR Berdine Addison on 04-16-22, test showed B1 level is high patient wants to know what Dr Berdine Addison wants to do or does patient need to be come back in to see DR Berdine Addison about his MG

## 2022-04-24 ENCOUNTER — Ambulatory Visit: Payer: Self-pay | Admitting: Internal Medicine

## 2022-04-24 ENCOUNTER — Ambulatory Visit (INDEPENDENT_AMBULATORY_CARE_PROVIDER_SITE_OTHER): Payer: Medicare Other | Admitting: Neurology

## 2022-04-24 ENCOUNTER — Encounter: Payer: Self-pay | Admitting: Neurology

## 2022-04-24 ENCOUNTER — Other Ambulatory Visit (INDEPENDENT_AMBULATORY_CARE_PROVIDER_SITE_OTHER): Payer: Medicare Other

## 2022-04-24 VITALS — BP 145/86 | HR 82 | Wt 206.0 lb

## 2022-04-24 DIAGNOSIS — R4589 Other symptoms and signs involving emotional state: Secondary | ICD-10-CM | POA: Diagnosis not present

## 2022-04-24 DIAGNOSIS — R5383 Other fatigue: Secondary | ICD-10-CM | POA: Diagnosis not present

## 2022-04-24 DIAGNOSIS — F32A Depression, unspecified: Secondary | ICD-10-CM

## 2022-04-24 DIAGNOSIS — R531 Weakness: Secondary | ICD-10-CM

## 2022-04-24 DIAGNOSIS — G4700 Insomnia, unspecified: Secondary | ICD-10-CM | POA: Diagnosis not present

## 2022-04-24 DIAGNOSIS — Z72820 Sleep deprivation: Secondary | ICD-10-CM | POA: Diagnosis not present

## 2022-04-24 NOTE — Patient Instructions (Signed)
Melatonin 3 mg 30 minutes before bedtime for sleep I would also like to send you to sleep medicine to discuss your poor sleep.  I will send more lab work today.  We will do a muscle and nerve test called an EMG.  ELECTROMYOGRAM AND NERVE CONDUCTION STUDIES (EMG/NCS) INSTRUCTIONS  How to Prepare The neurologist conducting the EMG will need to know if you have certain medical conditions. Tell the neurologist and other EMG lab personnel if you: Have a pacemaker or any other electrical medical device Take blood-thinning medications Have hemophilia, a blood-clotting disorder that causes prolonged bleeding Bathing Take a shower or bath shortly before your exam in order to remove oils from your skin. Don't apply lotions or creams before the exam.  What to Expect You'll likely be asked to change into a hospital gown for the procedure and lie down on an examination table. The following explanations can help you understand what will happen during the exam.  Electrodes. The neurologist or a technician places surface electrodes at various locations on your skin depending on where you're experiencing symptoms. Or the neurologist may insert needle electrodes at different sites depending on your symptoms.  Sensations. The electrodes will at times transmit a tiny electrical current that you may feel as a twinge or spasm. The needle electrode may cause discomfort or pain that usually ends shortly after the needle is removed. If you are concerned about discomfort or pain, you may want to talk to the neurologist about taking a short break during the exam.  Instructions. During the needle EMG, the neurologist will assess whether there is any spontaneous electrical activity when the muscle is at rest - activity that isn't present in healthy muscle tissue - and the degree of activity when you slightly contract the muscle.  He or she will give you instructions on resting and contracting a muscle at appropriate times.  Depending on what muscles and nerves the neurologist is examining, he or she may ask you to change positions during the exam.  After your EMG You may experience some temporary, minor bruising where the needle electrode was inserted into your muscle. This bruising should fade within several days. If it persists, contact your primary care doctor.    The physicians and staff at [facility] are committed to providing excellent care. You may receive a survey requesting feedback about your experience at our office. We strive to receive "very good" responses to the survey questions. If you feel that your experience would prevent you from giving the office a "very good " response, please contact our office to try to remedy the situation. We may be reached at [phone]. Thank you for taking the time out of your busy day to complete the survey.  Kai Levins, MD [facility]

## 2022-04-24 NOTE — Addendum Note (Signed)
Addended by: Renae Gloss on: 04/24/2022 12:03 PM   Modules accepted: Orders

## 2022-04-30 ENCOUNTER — Ambulatory Visit
Admission: RE | Admit: 2022-04-30 | Discharge: 2022-04-30 | Disposition: A | Discharge status: Home / Self Care | Payer: Medicare Other | Source: Ambulatory Visit | Attending: Emergency Medicine | Admitting: Emergency Medicine

## 2022-04-30 DIAGNOSIS — R531 Weakness: Secondary | ICD-10-CM | POA: Diagnosis not present

## 2022-04-30 DIAGNOSIS — N289 Disorder of kidney and ureter, unspecified: Secondary | ICD-10-CM | POA: Diagnosis not present

## 2022-04-30 DIAGNOSIS — R634 Abnormal weight loss: Secondary | ICD-10-CM | POA: Diagnosis not present

## 2022-04-30 DIAGNOSIS — N200 Calculus of kidney: Secondary | ICD-10-CM | POA: Diagnosis not present

## 2022-04-30 MED ORDER — IOPAMIDOL (ISOVUE-300) INJECTION 61%
100.0000 mL | Freq: Once | INTRAVENOUS | Status: AC | PRN
  Administered 2022-04-30: 100 mL via INTRAVENOUS

## 2022-05-02 ENCOUNTER — Other Ambulatory Visit: Payer: Self-pay

## 2022-05-02 ENCOUNTER — Emergency Department (HOSPITAL_COMMUNITY)
Admission: EM | Admit: 2022-05-02 | Discharge: 2022-05-02 | Disposition: A | Discharge status: Home / Self Care | Payer: Medicare Other | Attending: Emergency Medicine | Admitting: Emergency Medicine

## 2022-05-02 ENCOUNTER — Encounter (HOSPITAL_COMMUNITY): Payer: Self-pay | Admitting: Emergency Medicine

## 2022-05-02 ENCOUNTER — Emergency Department (HOSPITAL_COMMUNITY): Payer: Medicare Other

## 2022-05-02 DIAGNOSIS — Z1152 Encounter for screening for COVID-19: Secondary | ICD-10-CM | POA: Insufficient documentation

## 2022-05-02 DIAGNOSIS — I10 Essential (primary) hypertension: Secondary | ICD-10-CM | POA: Insufficient documentation

## 2022-05-02 DIAGNOSIS — R5383 Other fatigue: Secondary | ICD-10-CM | POA: Diagnosis not present

## 2022-05-02 DIAGNOSIS — R0602 Shortness of breath: Secondary | ICD-10-CM | POA: Diagnosis not present

## 2022-05-02 DIAGNOSIS — E291 Testicular hypofunction: Secondary | ICD-10-CM | POA: Diagnosis not present

## 2022-05-02 DIAGNOSIS — R531 Weakness: Secondary | ICD-10-CM | POA: Diagnosis not present

## 2022-05-02 DIAGNOSIS — R634 Abnormal weight loss: Secondary | ICD-10-CM | POA: Diagnosis not present

## 2022-05-02 DIAGNOSIS — Z79899 Other long term (current) drug therapy: Secondary | ICD-10-CM | POA: Insufficient documentation

## 2022-05-02 LAB — CBC WITH DIFFERENTIAL/PLATELET
Abs Immature Granulocytes: 0.03 10*3/uL (ref 0.00–0.07)
Basophils Absolute: 0 10*3/uL (ref 0.0–0.1)
Basophils Relative: 1 %
Eosinophils Absolute: 0.1 10*3/uL (ref 0.0–0.5)
Eosinophils Relative: 2 %
HCT: 43.5 % (ref 39.0–52.0)
Hemoglobin: 14.4 g/dL (ref 13.0–17.0)
Immature Granulocytes: 1 %
Lymphocytes Relative: 19 %
Lymphs Abs: 1.3 10*3/uL (ref 0.7–4.0)
MCH: 30.7 pg (ref 26.0–34.0)
MCHC: 33.1 g/dL (ref 30.0–36.0)
MCV: 92.8 fL (ref 80.0–100.0)
Monocytes Absolute: 0.6 10*3/uL (ref 0.1–1.0)
Monocytes Relative: 9 %
Neutro Abs: 4.5 10*3/uL (ref 1.7–7.7)
Neutrophils Relative %: 68 %
Platelets: 168 10*3/uL (ref 150–400)
RBC: 4.69 MIL/uL (ref 4.22–5.81)
RDW: 12.6 % (ref 11.5–15.5)
WBC: 6.6 10*3/uL (ref 4.0–10.5)
nRBC: 0 % (ref 0.0–0.2)

## 2022-05-02 LAB — COMPREHENSIVE METABOLIC PANEL
ALT: 22 U/L (ref 0–44)
AST: 11 U/L — ABNORMAL LOW (ref 15–41)
Albumin: 4 g/dL (ref 3.5–5.0)
Alkaline Phosphatase: 61 U/L (ref 38–126)
Anion gap: 11 (ref 5–15)
BUN: 22 mg/dL (ref 8–23)
CO2: 22 mmol/L (ref 22–32)
Calcium: 9.3 mg/dL (ref 8.9–10.3)
Chloride: 107 mmol/L (ref 98–111)
Creatinine, Ser: 1.36 mg/dL — ABNORMAL HIGH (ref 0.61–1.24)
GFR, Estimated: 55 mL/min — ABNORMAL LOW (ref >60)
Glucose, Bld: 111 mg/dL — ABNORMAL HIGH (ref 70–99)
Potassium: 4.1 mmol/L (ref 3.5–5.1)
Sodium: 140 mmol/L (ref 135–145)
Total Bilirubin: 0.6 mg/dL (ref 0.3–1.2)
Total Protein: 6.2 g/dL — ABNORMAL LOW (ref 6.5–8.1)

## 2022-05-02 LAB — MUSK ANTIBODIES

## 2022-05-02 LAB — CBG MONITORING, ED: Glucose-Capillary: 114 mg/dL — ABNORMAL HIGH (ref 70–99)

## 2022-05-02 LAB — MAGNESIUM: Magnesium: 2.2 mg/dL (ref 1.7–2.4)

## 2022-05-02 LAB — TSH: TSH: 1.76 u[IU]/mL (ref 0.350–4.500)

## 2022-05-02 LAB — TROPONIN I (HIGH SENSITIVITY)
Troponin I (High Sensitivity): 7 ng/L (ref <18)
Troponin I (High Sensitivity): 9 ng/L (ref <18)

## 2022-05-02 LAB — ACETYLCHOLINE RECEPTOR, BLOCKING: ACHR Blocking Abs: <15 % Inhibition (ref <15)

## 2022-05-02 LAB — RESP PANEL BY RT-PCR (FLU A&B, COVID) ARPGX2
Influenza A by PCR: NEGATIVE
Influenza B by PCR: NEGATIVE
SARS Coronavirus 2 by RT PCR: NEGATIVE

## 2022-05-02 LAB — ACETYLCHOLINE RECEPTOR, MODULATING: Acetylchol Modul Ab: <1 % Inhibition

## 2022-05-02 MED ORDER — PREDNISONE 10 MG PO TABS: ORAL_TABLET | ORAL | 0 refills | Status: DC

## 2022-05-02 MED ORDER — DEXAMETHASONE SODIUM PHOSPHATE 10 MG/ML IJ SOLN
10.0000 mg | Freq: Once | INTRAMUSCULAR | Status: AC
  Administered 2022-05-02: 10 mg via INTRAMUSCULAR
  Filled 2022-05-02: qty 1

## 2022-05-02 NOTE — ED Provider Triage Note (Signed)
Emergency Medicine Provider Triage Evaluation Note  Marcus Crawford , a 71 y.o. male  was evaluated in triage.  Pt complains of weakness.  Patient states that he has had a downtrend since June.  States that now he is so weak that he spends most of his day in bed.  States that he gets winded just taking a shower.  He complains of being extremely fatigued, losing about 15 to 20 pounds and having no appetite.  He recently was seen by his urologist today who sent him over here.  He is also seeing a neurologist for this to did not find anything acute.  He denies any chest pain, palpitations, abdominal pain, nausea, vomiting, diarrhea or fevers or night sweats or recent travel..  Review of Systems  Positive: See above Negative:   Physical Exam  BP (!) 185/113   Pulse 82   Temp 98.2 F (36.8 C)   Resp 18   SpO2 99%  Gen:   Awake, fatigued appearing  Resp:  Normal effort  MSK:   Moves extremities without difficulty  Other:  Abdomen is soft, no LE swelling   Medical Decision Making  Medically screening exam initiated at 3:44 PM.  Appropriate orders placed.  CARMICHAEL BURDETTE was informed that the remainder of the evaluation will be completed by another provider, this initial triage assessment does not replace that evaluation, and the importance of remaining in the ED until their evaluation is complete.     Mickie Hillier, PA-C 05/02/22 1545

## 2022-05-02 NOTE — ED Provider Notes (Signed)
[facility] DEPT Provider Note   CSN: [id number] Arrival date & time: 05/02/22  1457     History  Chief Complaint  Patient presents with   Weakness   Fatigue   Weight Loss    Marcus Crawford is a 70 y.o. male present emerged department generalized fatigue and weight loss.  The patient reports onset of symptoms around August.  He says used to be very active, but now is extremely low energy, feels exhausted all day, is a hard time getting out of bed, and has no appetite.  He reports losing about 15 pounds in the past 2 months.  He says he was too tired to even take a shower today.  He went to see the urologist today who told him he should come to the ED to have a further evaluation due to his level of fatigue.  HE has also been seen in the ED for this last month, had MRI of the brain and cervical spine, which was unremarkable.` He was subsequently seen by neurology as a referral in October 24, for this generalized weakness, and there were plans to check his thiamine levels and for potential EMG.  Show he has had normal TSH levels and free T4 levels.  Cortisol level was normal checked in October.  HPI     Home Medications Prior to Admission medications   Medication Sig Start Date End Date Taking? Authorizing Provider  predniSONE (DELTASONE) 10 MG tablet Take 4 tablets (40 mg total) by mouth daily with breakfast for 4 days, THEN 2 tablets (20 mg total) daily with breakfast for 4 days, THEN 1 tablet (10 mg total) daily with breakfast for 4 days. 05/03/22 05/15/22 Yes Wyvonnia Dusky, MD  buPROPion (WELLBUTRIN XL) 150 MG 24 hr tablet Take 1 tablet (150 mg total) by mouth daily. 04/19/22   Janith Lima, MD  cholecalciferol (VITAMIN D) 1000 UNITS tablet Take 1,000 Units by mouth daily.     [provider]  DULoxetine (CYMBALTA) 30 MG capsule Take 1 capsule (30 mg total) by mouth daily. 04/19/22   Janith Lima, MD  hypromellose (SYSTANE OVERNIGHT  THERAPY) 0.3 % GEL ophthalmic ointment Place 1 Application into both eyes at bedtime.    [provider]  levocetirizine (XYZAL) 5 MG tablet TAKE 1 TABLET(5 MG) BY MOUTH EVERY EVENING Patient taking differently: Take 5 mg by mouth daily as needed for allergies. 07/31/21   Janith Lima, MD  Polyvinyl Alcohol-Povidone (REFRESH OP) Place 1 drop into both eyes 4 (four) times daily as needed (dryness).    [provider]  pravastatin (PRAVACHOL) 10 MG tablet TAKE 1 TABLET(10 MG) BY MOUTH DAILY Patient taking differently: Take 10 mg by mouth at bedtime. 12/01/21   Janith Lima, MD  RABEprazole (ACIPHEX) 20 MG tablet TAKE ONE TABLET BY MOUTH ONE TIME DAILY Patient taking differently: Take 20 mg by mouth every other day. 10/15/21   Janith Lima, MD  tadalafil (CIALIS) 20 MG tablet Take 20 mg by mouth daily as needed for erectile dysfunction. 01/05/19   [provider]  Testosterone 1.62 % GEL Place onto the skin.    [provider]  zolpidem (AMBIEN) 10 MG tablet Take 1 tablet (10 mg total) by mouth at bedtime as needed for sleep. 04/03/22 05/03/22  Horald Pollen, MD      Allergies    Penicillins, Sulfa antibiotics, and Aspirin    Review of Systems   Review of Systems  Physical Exam Updated Vital Signs BP (!) 185/113   Pulse 82   Temp 98.2 F (36.8 C)   Resp 18   SpO2 99%  Physical Exam Constitutional:      General: He is not in acute distress.    Comments: Speaking slowly, appears fatigued  HENT:     Head: Normocephalic and atraumatic.  Eyes:     Conjunctiva/sclera: Conjunctivae normal.     Pupils: Pupils are equal, round, and reactive to light.  Cardiovascular:     Rate and Rhythm: Normal rate and regular rhythm.  Pulmonary:     Effort: Pulmonary effort is normal. No respiratory distress.  Abdominal:     General: There is no distension.     Tenderness: There is no abdominal tenderness.  Skin:    General: Skin is warm and dry.   Neurological:     General: No focal deficit present.     Mental Status: He is alert. Mental status is at baseline.  Psychiatric:        Mood and Affect: Mood normal.        Behavior: Behavior normal.     ED Results / Procedures / Treatments   Labs (all labs ordered are listed, but only abnormal results are displayed) Labs Reviewed  COMPREHENSIVE METABOLIC PANEL - Abnormal; Notable for the following components:      Result Value   Glucose, Bld 111 (*)    Creatinine, Ser 1.36 (*)    Total Protein 6.2 (*)    AST 11 (*)    GFR, Estimated 55 (*)    All other components within normal limits  CBG MONITORING, ED - Abnormal; Notable for the following components:   Glucose-Capillary 114 (*)    All other components within normal limits  RESP PANEL BY RT-PCR (FLU A&B, COVID) ARPGX2  CBC WITH DIFFERENTIAL/PLATELET  TSH  MAGNESIUM  URINALYSIS, ROUTINE W REFLEX MICROSCOPIC  TROPONIN I (HIGH SENSITIVITY)  TROPONIN I (HIGH SENSITIVITY)    EKG EKG Interpretation  Date/Time:  Wednesday May 02 2022 15:22:42 EDT Ventricular Rate:  71 PR Interval:  179 QRS Duration: 101 QT Interval:  396 QTC Calculation: 431 R Axis:   -57 Text Interpretation: Sinus rhythm Left anterior fascicular block Confirmed by Octaviano Glow [phone] on 05/02/2022 9:58:24 PM  Radiology DG Chest 2 View  Result Date: 05/02/2022 CLINICAL DATA:  Sob. Never smoker, HTN, Pt reports weakness AND fatigue increasingly recently. Pt also reports weight loss over the past few months. Reports he does take testosterone which doesn't make him feel good. Urology sent over EXAM: CHEST - 2 VIEW COMPARISON:  Chest x-ray 04/10/2022 FINDINGS: The heart and mediastinal contours are within normal limits. No focal consolidation. No pulmonary edema. No pleural effusion. No pneumothorax. No acute osseous abnormality. IMPRESSION: No active cardiopulmonary disease. Electronically Signed   By: Iven Finn M.D.   On: 05/02/2022 15:58     Procedures Procedures    Medications Ordered in ED Medications  dexamethasone (DECADRON) injection 10 mg (has no administration in time range)    ED Course/ Medical Decision Making/ A&P                           Medical Decision Making Risk Prescription drug management.   Patient is here with generalized fatigue, ongoing for 3 months.  Differential is broad, and has been worked up both inpatient and outpatient before, and there is no evidence of thyroid disorder, adrenal insufficiency per cortisol  level, anemia, electrolyte derangement, stroke, MS.  He is being evaluated for possible neuromuscular disorder by neurology with for potential EMG.  I do not see evidence of myasthenic crisis or respiratory failure.  I personally viewed interpret the patient's labs, EKG and imaging today.  There were no emergent findings.  EKG is normal sinus rhythm.  He has no arrhythmia.  He is somewhat hypertensive, but not hypotensive.  Glucose within normal limits, is not hyperglycemic.  X-ray per my interpretation does not show any focal infiltrate.  At this point I have considered offering him some Decadron and steroids at home, she has not tried, which may give him some energy and appetite back.  I would refer him back to neurology.  There is no emergent indication for hospitalization at this time        Final Clinical Impression(s) / ED Diagnoses Final diagnoses:  Weakness    Rx / DC Orders ED Discharge Orders          Ordered    predniSONE (DELTASONE) 10 MG tablet  Q breakfast        05/02/22 2200              Wyvonnia Dusky, MD 05/02/22 2200

## 2022-05-02 NOTE — ED Triage Notes (Signed)
Pt reports weakness & fatigue increasingly recently. Pt also reports weight loss over the past few months. Reports he does take testosterone which doesn't make him feel good. Urology sent over.

## 2022-05-02 NOTE — Discharge Instructions (Addendum)
We gave you a shot of steroids in the ER to see if this will help with your energy and appetite.  I also prescribed you a lower dose of prednisone to take at home for the next 10 to 14 days if you are feeling better with it.

## 2022-05-03 ENCOUNTER — Other Ambulatory Visit: Payer: Self-pay | Admitting: Emergency Medicine

## 2022-05-03 DIAGNOSIS — G4700 Insomnia, unspecified: Secondary | ICD-10-CM

## 2022-05-08 ENCOUNTER — Encounter: Payer: Self-pay | Admitting: Internal Medicine

## 2022-05-08 ENCOUNTER — Telehealth: Payer: Self-pay | Admitting: *Deleted

## 2022-05-08 ENCOUNTER — Encounter: Payer: Self-pay | Admitting: *Deleted

## 2022-05-08 ENCOUNTER — Telehealth: Payer: Self-pay | Admitting: Internal Medicine

## 2022-05-08 ENCOUNTER — Ambulatory Visit (INDEPENDENT_AMBULATORY_CARE_PROVIDER_SITE_OTHER): Payer: Medicare Other | Admitting: Internal Medicine

## 2022-05-08 VITALS — BP 128/76 | HR 78 | Temp 97.6°F | Ht 72.0 in | Wt 202.0 lb

## 2022-05-08 DIAGNOSIS — R4189 Other symptoms and signs involving cognitive functions and awareness: Secondary | ICD-10-CM | POA: Diagnosis not present

## 2022-05-08 DIAGNOSIS — F322 Major depressive disorder, single episode, severe without psychotic features: Secondary | ICD-10-CM

## 2022-05-08 DIAGNOSIS — F5104 Psychophysiologic insomnia: Secondary | ICD-10-CM | POA: Diagnosis not present

## 2022-05-08 DIAGNOSIS — G4700 Insomnia, unspecified: Secondary | ICD-10-CM

## 2022-05-08 MED ORDER — QUETIAPINE FUMARATE 25 MG PO TABS: 25.0000 mg | ORAL_TABLET | Freq: Every day | ORAL | 0 refills | Status: DC

## 2022-05-08 MED ORDER — DULOXETINE HCL 30 MG PO CPEP: 30.0000 mg | ORAL_CAPSULE | Freq: Every day | ORAL | 0 refills | Status: DC

## 2022-05-08 MED ORDER — ZOLPIDEM TARTRATE 10 MG PO TABS: 10.0000 mg | ORAL_TABLET | Freq: Every evening | ORAL | 0 refills | Status: DC | PRN

## 2022-05-08 NOTE — Patient Outreach (Signed)
  Erroneous note/ LMT

## 2022-05-08 NOTE — Patient Instructions (Signed)

## 2022-05-08 NOTE — Telephone Encounter (Signed)
Patient called and said that he just saw Dr Ronnald Ramp and he had talked about sending in Bupropion for him as well. He was wondering if it was going to be sent in? Call back is [phone]

## 2022-05-08 NOTE — Progress Notes (Unsigned)
Subjective:  Patient ID: Marcus Crawford, male    DOB: 04-20-1951  Age: 71 y.o. MRN: [id number]  CC: No chief complaint on file.   HPI Marcus Crawford presents for ***  Outpatient Medications Prior to Visit  Medication Sig Dispense Refill   buPROPion (WELLBUTRIN XL) 150 MG 24 hr tablet Take 1 tablet (150 mg total) by mouth daily. 30 tablet 0   cholecalciferol (VITAMIN D) 1000 UNITS tablet Take 1,000 Units by mouth daily.      DULoxetine (CYMBALTA) 30 MG capsule Take 1 capsule (30 mg total) by mouth daily. 30 capsule 0   hypromellose (SYSTANE OVERNIGHT THERAPY) 0.3 % GEL ophthalmic ointment Place 1 Application into both eyes at bedtime.     levocetirizine (XYZAL) 5 MG tablet TAKE 1 TABLET(5 MG) BY MOUTH EVERY EVENING (Patient taking differently: Take 5 mg by mouth daily as needed for allergies.) 90 tablet 1   Polyvinyl Alcohol-Povidone (REFRESH OP) Place 1 drop into both eyes 4 (four) times daily as needed (dryness).     pravastatin (PRAVACHOL) 10 MG tablet TAKE 1 TABLET(10 MG) BY MOUTH DAILY (Patient taking differently: Take 10 mg by mouth at bedtime.) 90 tablet 1   predniSONE (DELTASONE) 10 MG tablet Take 4 tablets (40 mg total) by mouth daily with breakfast for 4 days, THEN 2 tablets (20 mg total) daily with breakfast for 4 days, THEN 1 tablet (10 mg total) daily with breakfast for 4 days. 15 tablet 0   RABEprazole (ACIPHEX) 20 MG tablet TAKE ONE TABLET BY MOUTH ONE TIME DAILY (Patient taking differently: Take 20 mg by mouth every other day.) 90 tablet 1   tadalafil (CIALIS) 20 MG tablet Take 20 mg by mouth daily as needed for erectile dysfunction.     Testosterone 1.62 % GEL Place onto the skin.     zolpidem (AMBIEN) 10 MG tablet TAKE 1 TABLET(10 MG) BY MOUTH AT BEDTIME AS NEEDED FOR SLEEP 15 tablet 0   No facility-administered medications prior to visit.    ROS Review of Systems  Objective:  BP 128/76 (BP Location: Left Arm, Patient Position: Sitting, Cuff Size: Large)   Pulse 78    Temp 97.6 F (36.4 C) (Oral)   Ht 6' (1.829 m)   Wt 202 lb (91.6 kg)   SpO2 94%   BMI 27.40 kg/m   BP Readings from Last 3 Encounters:  05/08/22 128/76  05/02/22 (!) 148/101  04/24/22 (!) 145/86    Wt Readings from Last 3 Encounters:  05/08/22 202 lb (91.6 kg)  04/24/22 206 lb (93.4 kg)  04/16/22 205 lb 9.6 oz (93.3 kg)    Physical Exam  Lab Results  Component Value Date   WBC 6.6 05/02/2022   HGB 14.4 05/02/2022   HCT 43.5 05/02/2022   PLT 168 05/02/2022   GLUCOSE 111 (H) 05/02/2022   CHOL 145 05/02/2021   TRIG 234.0 (H) 05/02/2021   HDL 37.20 (L) 05/02/2021   LDLDIRECT 80.0 05/02/2021   LDLCALC 115 (H) 01/26/2020   ALT 22 05/02/2022   AST 11 (L) 05/02/2022   NA 140 05/02/2022   K 4.1 05/02/2022   CL 107 05/02/2022   CREATININE 1.36 (H) 05/02/2022   BUN 22 05/02/2022   CO2 22 05/02/2022   TSH 1.760 05/02/2022   PSA 0.0 10/11/2021   INR 1.12 02/28/2012   HGBA1C 6.0 (H) 02/20/2022   MICROALBUR 0.4 10/14/2008    DG Chest 2 View  Result Date: 05/02/2022 CLINICAL DATA:  Sob. Never smoker,  HTN, Pt reports weakness AND fatigue increasingly recently. Pt also reports weight loss over the past few months. Reports he does take testosterone which doesn't make him feel good. Urology sent over EXAM: CHEST - 2 VIEW COMPARISON:  Chest x-ray 04/10/2022 FINDINGS: The heart and mediastinal contours are within normal limits. No focal consolidation. No pulmonary edema. No pleural effusion. No pneumothorax. No acute osseous abnormality. IMPRESSION: No active cardiopulmonary disease. Electronically Signed   By: Iven Finn M.D.   On: 05/02/2022 15:58    Assessment & Plan:   There are no diagnoses linked to this encounter. I am having Marcus Crawford "Marcus Crawford" maintain his cholecalciferol, tadalafil, levocetirizine, RABEprazole, pravastatin, Testosterone, Polyvinyl Alcohol-Povidone (REFRESH OP), Systane Overnight Therapy, DULoxetine, buPROPion, predniSONE, and zolpidem.  No  orders of the defined types were placed in this encounter.    Follow-up: No follow-ups on file.  Scarlette Calico, MD

## 2022-05-09 NOTE — Telephone Encounter (Signed)
Pt has bee informed that the Cymbalta, Seroquel and Ambien was sent on 11/7. He expressed understanding.

## 2022-05-09 NOTE — Patient Outreach (Signed)
  Care Coordination TOC Note Transition Care Management Follow-up Telephone Call Date of discharge and from where: 05/02/22 ED Visit [facility]; fatigue, weight loss, weakness/ low energy; EMMI Red Alert: "No discharge instructions" How have you been since you were released from the hospital? 05/08/22: "I am still just very frustrated.  I don't know what is going on with my healthcare-- I have had these symptoms for months, and none of the doctors I have seen can tell me anything about what is wrong.  I am just so weak and tired all the time.... I am going to see Dr. Ronnald Ramp this afternoon, and I have another call coming in now-- will you please call me back tomorrow at some point?" 05/09/22 follow up: "I saw Dr. Ronnald Ramp yesterday and I will be seeing my neurologist later this month; Dr. Ronnald Ramp gave me some medicine to take, hopefully that will help.  My sister is here helping me manage everything around my health care.  I am doing okay overall and don't have any questions today" Any questions or concerns? No  Items Reviewed: Did the pt receive and understand the discharge instructions provided? Yes  confirms he did receive discharge instructions, we reviewed together briefly Medications obtained and verified? Yes  Other? No  Any new allergies since your discharge? No  Dietary orders reviewed? Yes Do you have support at home? Yes  sister currently assisting patient with ADL's and iADL's as needed/ indicated  Home Care and Equipment/Supplies: Were home health services ordered? no If so, what is the name of the agency? N/A  Has the agency set up a time to come to the patient's home? no Were any new equipment or medical supplies ordered?  No What is the name of the medical supply agency? N/A Were you able to get the supplies/equipment? not applicable Do you have any questions related to the use of the equipment or supplies? No N/A  Functional Questionnaire: (I = Independent and D = Dependent) ADLs:  I  sister currently assisting patient with ADL's and iADL's as needed/ indicated  Bathing/Dressing- I  Meal Prep- I  sister currently assisting patient with ADL's and iADL's as needed/ indicated  Eating- I  Maintaining continence- I  Transferring/Ambulation- I  Managing Meds- I  sister currently assisting patient with ADL's and iADL's as needed/ indicated  Follow up appointments reviewed:  PCP Hospital f/u appt confirmed? Yes  Scheduled to see PCP, Dr. Ronnald Ramp on Tuesday 05/08/22 @ 2:20 pm: verified patient attended appointment as scheduled [facility] f/u appt confirmed? Yes  Scheduled to see neurologist team on 05/22/22 and 05/30/22 @ 11:00 am and 3:30 pm, respectively Are transportation arrangements needed? No  If their condition worsens, is the pt aware to call PCP or go to the Emergency Dept.? Yes Was the patient provided with contact information for the PCP's office or ED? Yes Was to pt encouraged to call back with questions or concerns? Yes  SDOH assessments and interventions completed:   Yes  Care Coordination Interventions Activated:  Yes   Care Coordination Interventions:  Reviewed post- ED discharge instructions with patient; reviewed post- ED PCP office visit with patient and confirmed patient obtained and is taking new medications as instructed by PCP    Encounter Outcome:  Pt. Visit Completed    Oneta Rack, RN, BSN, CCRN Alumnus RN CM Care Coordination/ Transition of [facility] [phone]: direct office

## 2022-05-10 ENCOUNTER — Ambulatory Visit: Payer: Medicare Other | Admitting: Internal Medicine

## 2022-05-14 ENCOUNTER — Telehealth: Payer: Self-pay

## 2022-05-14 NOTE — Telephone Encounter (Signed)
     Patient  visit on 11/1  at [facility]   Have you been able to follow up with your primary care physician? YES  The patient was or was not able to obtain any needed medicine or equipment. YES  Are there diet recommendations that you are having difficulty following? NA  Patient expresses understanding of discharge instructions and education provided has no other needs at this time.  [place], [facility], Care Management  [phone] [address], [place], [place] [address] Phone: [phone] Email: [email or web address]

## 2022-05-16 ENCOUNTER — Other Ambulatory Visit: Payer: Self-pay | Admitting: Internal Medicine

## 2022-05-16 DIAGNOSIS — F5104 Psychophysiologic insomnia: Secondary | ICD-10-CM

## 2022-05-17 ENCOUNTER — Ambulatory Visit: Payer: Medicare Other | Admitting: Neurology

## 2022-05-18 ENCOUNTER — Telehealth: Payer: Self-pay | Admitting: Internal Medicine

## 2022-05-18 NOTE — Telephone Encounter (Signed)
PT calls today in regards to an upcoming sleep study scheduled by the hospital. PT was wondering whether they should still follow up with the sleep study? PT was prescribed zolpidem (AMBIEN) 10 MG tablet  during their last visit with Dr.Jones. PT was stating that because this RX helps with sleep would these readings even be beneficial/accurate?  CB for PT: [phone]

## 2022-05-21 ENCOUNTER — Ambulatory Visit: Payer: Medicare Other | Admitting: Internal Medicine

## 2022-05-22 ENCOUNTER — Encounter: Payer: Medicare Other | Admitting: Neurology

## 2022-05-22 NOTE — Telephone Encounter (Signed)
Pt has been informed that med will help with sleep study. He expressed understanding.

## 2022-05-24 ENCOUNTER — Other Ambulatory Visit: Payer: Self-pay | Admitting: Internal Medicine

## 2022-05-24 DIAGNOSIS — E785 Hyperlipidemia, unspecified: Secondary | ICD-10-CM

## 2022-05-28 ENCOUNTER — Institutional Professional Consult (permissible substitution): Payer: Medicare Other | Admitting: Internal Medicine

## 2022-05-30 ENCOUNTER — Ambulatory Visit: Payer: Medicare Other | Admitting: Neurology

## 2022-05-30 ENCOUNTER — Other Ambulatory Visit: Payer: Self-pay | Admitting: Internal Medicine

## 2022-05-30 ENCOUNTER — Encounter: Payer: Self-pay | Admitting: Internal Medicine

## 2022-05-30 DIAGNOSIS — F322 Major depressive disorder, single episode, severe without psychotic features: Secondary | ICD-10-CM

## 2022-05-30 DIAGNOSIS — F5104 Psychophysiologic insomnia: Secondary | ICD-10-CM

## 2022-05-30 MED ORDER — DULOXETINE HCL 60 MG PO CPEP: 60.0000 mg | ORAL_CAPSULE | Freq: Every day | ORAL | 0 refills | Status: DC

## 2022-05-30 MED ORDER — TRAZODONE HCL 50 MG PO TABS: 50.0000 mg | ORAL_TABLET | Freq: Every day | ORAL | 0 refills | Status: DC

## 2022-06-06 ENCOUNTER — Ambulatory Visit: Payer: Medicare Other | Admitting: Gastroenterology

## 2022-06-07 ENCOUNTER — Ambulatory Visit (INDEPENDENT_AMBULATORY_CARE_PROVIDER_SITE_OTHER): Payer: Medicare Other

## 2022-06-07 DIAGNOSIS — Z23 Encounter for immunization: Secondary | ICD-10-CM | POA: Diagnosis not present

## 2022-06-07 NOTE — Progress Notes (Signed)
After obtaining consent, and per orders of Dr. Ronnald Ramp, injection of High Dose Flu shot was given in the left deltoid by Marrian Salvage. Patient instructed to report any adverse reaction to me immediately.

## 2022-06-12 DIAGNOSIS — N183 Chronic kidney disease, stage 3 unspecified: Secondary | ICD-10-CM | POA: Diagnosis not present

## 2022-06-12 DIAGNOSIS — I129 Hypertensive chronic kidney disease with stage 1 through stage 4 chronic kidney disease, or unspecified chronic kidney disease: Secondary | ICD-10-CM | POA: Diagnosis not present

## 2022-06-12 DIAGNOSIS — R5383 Other fatigue: Secondary | ICD-10-CM | POA: Diagnosis not present

## 2022-06-12 DIAGNOSIS — N2581 Secondary hyperparathyroidism of renal origin: Secondary | ICD-10-CM | POA: Diagnosis not present

## 2022-06-12 DIAGNOSIS — E559 Vitamin D deficiency, unspecified: Secondary | ICD-10-CM | POA: Diagnosis not present

## 2022-06-12 DIAGNOSIS — E1122 Type 2 diabetes mellitus with diabetic chronic kidney disease: Secondary | ICD-10-CM | POA: Diagnosis not present

## 2022-06-12 DIAGNOSIS — Z8679 Personal history of other diseases of the circulatory system: Secondary | ICD-10-CM | POA: Diagnosis not present

## 2022-06-14 DIAGNOSIS — H02834 Dermatochalasis of left upper eyelid: Secondary | ICD-10-CM | POA: Diagnosis not present

## 2022-06-14 DIAGNOSIS — H16223 Keratoconjunctivitis sicca, not specified as Sjogren's, bilateral: Secondary | ICD-10-CM | POA: Diagnosis not present

## 2022-06-14 DIAGNOSIS — H02831 Dermatochalasis of right upper eyelid: Secondary | ICD-10-CM | POA: Diagnosis not present

## 2022-06-15 LAB — BASIC METABOLIC PANEL
BUN: 30 — AB (ref 4–21)
CO2: 28 — AB (ref 13–22)
Chloride: 103 (ref 99–108)
Creatinine: 1.4 — AB (ref 0.6–1.3)
Glucose: 102
Potassium: 4.7 mEq/L (ref 3.5–5.1)
Sodium: 139 (ref 137–147)

## 2022-06-15 LAB — COMPREHENSIVE METABOLIC PANEL
Albumin: 4.2 (ref 3.5–5.0)
Calcium: 9.5 (ref 8.7–10.7)
eGFR: 51

## 2022-06-15 LAB — VITAMIN D 25 HYDROXY (VIT D DEFICIENCY, FRACTURES): Vit D, 25-Hydroxy: 48.9

## 2022-06-15 LAB — PROTEIN / CREATININE RATIO, URINE: Creatinine, Urine: 157.9

## 2022-07-12 DIAGNOSIS — H04123 Dry eye syndrome of bilateral lacrimal glands: Secondary | ICD-10-CM | POA: Diagnosis not present

## 2022-07-28 ENCOUNTER — Encounter: Payer: Self-pay | Admitting: Internal Medicine

## 2022-07-30 ENCOUNTER — Other Ambulatory Visit: Payer: Self-pay | Admitting: Internal Medicine

## 2022-07-30 ENCOUNTER — Telehealth: Payer: Self-pay

## 2022-07-30 DIAGNOSIS — R4189 Other symptoms and signs involving cognitive functions and awareness: Secondary | ICD-10-CM

## 2022-07-30 DIAGNOSIS — R453 Demoralization and apathy: Secondary | ICD-10-CM | POA: Insufficient documentation

## 2022-07-30 DIAGNOSIS — F322 Major depressive disorder, single episode, severe without psychotic features: Secondary | ICD-10-CM

## 2022-07-30 DIAGNOSIS — F5104 Psychophysiologic insomnia: Secondary | ICD-10-CM

## 2022-07-30 MED ORDER — METHYLPHENIDATE HCL ER (OSM) 18 MG PO TBCR: 18.0000 mg | EXTENDED_RELEASE_TABLET | Freq: Every day | ORAL | 0 refills | Status: DC

## 2022-07-30 NOTE — Telephone Encounter (Signed)
Key: [id number]

## 2022-07-30 NOTE — Telephone Encounter (Signed)
Approved  This approval is for 07/30/2022 until further notice.

## 2022-08-08 ENCOUNTER — Ambulatory Visit (INDEPENDENT_AMBULATORY_CARE_PROVIDER_SITE_OTHER): Payer: Medicare Other | Admitting: Internal Medicine

## 2022-08-08 ENCOUNTER — Encounter: Payer: Self-pay | Admitting: Internal Medicine

## 2022-08-08 VITALS — BP 138/88 | HR 78 | Temp 98.0°F | Resp 16 | Ht 72.0 in | Wt 200.0 lb

## 2022-08-08 DIAGNOSIS — F322 Major depressive disorder, single episode, severe without psychotic features: Secondary | ICD-10-CM | POA: Diagnosis not present

## 2022-08-08 DIAGNOSIS — E785 Hyperlipidemia, unspecified: Secondary | ICD-10-CM | POA: Insufficient documentation

## 2022-08-08 DIAGNOSIS — I10 Essential (primary) hypertension: Secondary | ICD-10-CM

## 2022-08-08 DIAGNOSIS — Z23 Encounter for immunization: Secondary | ICD-10-CM

## 2022-08-08 MED ORDER — BOOSTRIX 5-2.5-18.5 LF-MCG/0.5 IM SUSP: 0.5000 mL | Freq: Once | INTRAMUSCULAR | 0 refills | Status: AC

## 2022-08-08 MED ORDER — SHINGRIX 50 MCG/0.5ML IM SUSR: 0.5000 mL | Freq: Once | INTRAMUSCULAR | 1 refills | Status: AC

## 2022-08-08 NOTE — Patient Instructions (Signed)
Hypertension, Adult High blood pressure (hypertension) is when the force of blood pumping through the arteries is too strong. The arteries are the blood vessels that carry blood from the heart throughout the body. Hypertension forces the heart to work harder to pump blood and may cause arteries to become narrow or stiff. Untreated or uncontrolled hypertension can lead to a heart attack, heart failure, a stroke, kidney disease, and other problems. A blood pressure reading consists of a higher number over a lower number. Ideally, your blood pressure should be below 120/80. The first ("top") number is called the systolic pressure. It is a measure of the pressure in your arteries as your heart beats. The second ("bottom") number is called the diastolic pressure. It is a measure of the pressure in your arteries as the heart relaxes. What are the causes? The exact cause of this condition is not known. There are some conditions that result in high blood pressure. What increases the risk? Certain factors may make you more likely to develop high blood pressure. Some of these risk factors are under your control, including: Smoking. Not getting enough exercise or physical activity. Being overweight. Having too much fat, sugar, calories, or salt (sodium) in your diet. Drinking too much alcohol. Other risk factors include: Having a personal history of heart disease, diabetes, high cholesterol, or kidney disease. Stress. Having a family history of high blood pressure and high cholesterol. Having obstructive sleep apnea. Age. The risk increases with age. What are the signs or symptoms? High blood pressure may not cause symptoms. Very high blood pressure (hypertensive crisis) may cause: Headache. Fast or irregular heartbeats (palpitations). Shortness of breath. Nosebleed. Nausea and vomiting. Vision changes. Severe chest pain, dizziness, and seizures. How is this diagnosed? This condition is diagnosed by  measuring your blood pressure while you are seated, with your arm resting on a flat surface, your legs uncrossed, and your feet flat on the floor. The cuff of the blood pressure monitor will be placed directly against the skin of your upper arm at the level of your heart. Blood pressure should be measured at least twice using the same arm. Certain conditions can cause a difference in blood pressure between your right and left arms. If you have a high blood pressure reading during one visit or you have normal blood pressure with other risk factors, you may be asked to: Return on a different day to have your blood pressure checked again. Monitor your blood pressure at home for 1 week or longer. If you are diagnosed with hypertension, you may have other blood or imaging tests to help your health care provider understand your overall risk for other conditions. How is this treated? This condition is treated by making healthy lifestyle changes, such as eating healthy foods, exercising more, and reducing your alcohol intake. You may be referred for counseling on a healthy diet and physical activity. Your health care provider may prescribe medicine if lifestyle changes are not enough to get your blood pressure under control and if: Your systolic blood pressure is above 130. Your diastolic blood pressure is above 80. Your personal target blood pressure may vary depending on your medical conditions, your age, and other factors. Follow these instructions at home: Eating and drinking  Eat a diet that is high in fiber and potassium, and low in sodium, added sugar, and fat. An example of this eating plan is called the DASH diet. DASH stands for Dietary Approaches to Stop Hypertension. To eat this way: Eat   plenty of fresh fruits and vegetables. Try to fill one half of your plate at each meal with fruits and vegetables. Eat whole grains, such as whole-wheat pasta, brown rice, or whole-grain bread. Fill about one  fourth of your plate with whole grains. Eat or drink low-fat dairy products, such as skim milk or low-fat yogurt. Avoid fatty cuts of meat, processed or cured meats, and poultry with skin. Fill about one fourth of your plate with lean proteins, such as fish, chicken without skin, beans, eggs, or tofu. Avoid pre-made and processed foods. These tend to be higher in sodium, added sugar, and fat. Reduce your daily sodium intake. Many people with hypertension should eat less than 1,500 mg of sodium a day. Do not drink alcohol if: Your health care provider tells you not to drink. You are pregnant, may be pregnant, or are planning to become pregnant. If you drink alcohol: Limit how much you have to: 0-1 drink a day for women. 0-2 drinks a day for men. Know how much alcohol is in your drink. In the [place] one drink equals one 12 oz bottle of beer (355 mL), one 5 oz glass of wine (148 mL), or one 1 oz glass of hard liquor (44 mL). Lifestyle  Work with your health care provider to maintain a healthy body weight or to lose weight. Ask what an ideal weight is for you. Get at least 30 minutes of exercise that causes your heart to beat faster (aerobic exercise) most days of the week. Activities may include walking, swimming, or biking. Include exercise to strengthen your muscles (resistance exercise), such as Pilates or lifting weights, as part of your weekly exercise routine. Try to do these types of exercises for 30 minutes at least 3 days a week. Do not use any products that contain nicotine or tobacco. These products include cigarettes, chewing tobacco, and vaping devices, such as e-cigarettes. If you need help quitting, ask your health care provider. Monitor your blood pressure at home as told by your health care provider. Keep all follow-up visits. This is important. Medicines Take over-the-counter and prescription medicines only as told by your health care provider. Follow directions carefully. Blood  pressure medicines must be taken as prescribed. Do not skip doses of blood pressure medicine. Doing this puts you at risk for problems and can make the medicine less effective. Ask your health care provider about side effects or reactions to medicines that you should watch for. Contact a health care provider if you: Think you are having a reaction to a medicine you are taking. Have headaches that keep coming back (recurring). Feel dizzy. Have swelling in your ankles. Have trouble with your vision. Get help right away if you: Develop a severe headache or confusion. Have unusual weakness or numbness. Feel faint. Have severe pain in your chest or abdomen. Vomit repeatedly. Have trouble breathing. These symptoms may be an emergency. Get help right away. Call 911. Do not wait to see if the symptoms will go away. Do not drive yourself to the hospital. Summary Hypertension is when the force of blood pumping through your arteries is too strong. If this condition is not controlled, it may put you at risk for serious complications. Your personal target blood pressure may vary depending on your medical conditions, your age, and other factors. For most people, a normal blood pressure is less than 120/80. Hypertension is treated with lifestyle changes, medicines, or a combination of both. Lifestyle changes include losing weight, eating a healthy,   low-sodium diet, exercising more, and limiting alcohol. This information is not intended to replace advice given to you by your health care provider. Make sure you discuss any questions you have with your health care provider. Document Revised: 04/25/2021 Document Reviewed: 04/25/2021 Elsevier Patient Education  2023 Elsevier Inc.  

## 2022-08-08 NOTE — Progress Notes (Unsigned)
Subjective:  Patient ID: Marcus Crawford, male    DOB: August 12, 1950  Age: 72 y.o. MRN: [id number]  CC: No chief complaint on file.   HPI RAQUON MILLEDGE presents for f/up -  He walks about a mile a day.  His endurance is good.  He denies exertional chest pain, diaphoresis, palpitations, or edema.  He tells me he is feeling much better since he started taking methylphenidate and the antidepressants.  He still has episodes of anxiety.  Sometimes the anxiety causes the chest pressure but he does not have any exertional symptoms.  Outpatient Medications Prior to Visit  Medication Sig Dispense Refill   cholecalciferol (VITAMIN D) 1000 UNITS tablet Take 1,000 Units by mouth daily.      DULoxetine (CYMBALTA) 60 MG capsule Take 1 capsule (60 mg total) by mouth daily. 90 capsule 0   hypromellose (SYSTANE OVERNIGHT THERAPY) 0.3 % GEL ophthalmic ointment Place 1 Application into both eyes at bedtime.     levocetirizine (XYZAL) 5 MG tablet TAKE 1 TABLET(5 MG) BY MOUTH EVERY EVENING (Patient taking differently: Take 5 mg by mouth daily as needed for allergies.) 90 tablet 1   methylphenidate (CONCERTA) 18 MG PO CR tablet Take 1 tablet (18 mg total) by mouth daily. 30 tablet 0   Polyvinyl Alcohol-Povidone (REFRESH OP) Place 1 drop into both eyes 4 (four) times daily as needed (dryness).     pravastatin (PRAVACHOL) 10 MG tablet TAKE 1 TABLET(10 MG) BY MOUTH DAILY 90 tablet 1   QUEtiapine (SEROQUEL) 25 MG tablet TAKE 1 TABLET(25 MG) BY MOUTH AT BEDTIME 90 tablet 0   RABEprazole (ACIPHEX) 20 MG tablet TAKE ONE TABLET BY MOUTH ONE TIME DAILY (Patient taking differently: Take 20 mg by mouth every other day.) 90 tablet 1   tadalafil (CIALIS) 20 MG tablet Take 20 mg by mouth daily as needed for erectile dysfunction.     traZODone (DESYREL) 50 MG tablet Take 1 tablet (50 mg total) by mouth at bedtime. 90 tablet 0   zolpidem (AMBIEN) 10 MG tablet Take 1 tablet (10 mg total) by mouth at bedtime as needed for sleep. 90  tablet 0   No facility-administered medications prior to visit.    ROS Review of Systems  Constitutional:  Positive for fatigue. Negative for chills, diaphoresis and unexpected weight change.  HENT:  Positive for voice change. Negative for trouble swallowing.   Respiratory:  Negative for cough, chest tightness, shortness of breath and wheezing.   Cardiovascular:  Positive for chest pain. Negative for palpitations and leg swelling.  Gastrointestinal:  Negative for abdominal pain, constipation, diarrhea, nausea and vomiting.  Genitourinary: Negative.  Negative for difficulty urinating and dysuria.  Musculoskeletal: Negative.  Negative for arthralgias and myalgias.  Skin: Negative.   Neurological: Negative.  Negative for dizziness, tremors and weakness.  Hematological:  Negative for adenopathy. Does not bruise/bleed easily.  Psychiatric/Behavioral:  Positive for decreased concentration, dysphoric mood and sleep disturbance. Negative for agitation, behavioral problems, confusion, hallucinations, self-injury and suicidal ideas. The patient is nervous/anxious. The patient is not hyperactive.     Objective:  BP 138/88 (BP Location: Left Arm, Patient Position: Sitting, Cuff Size: Large)   Pulse 78   Temp 98 F (36.7 C) (Oral)   Resp 16   Ht 6' (1.829 m)   Wt 200 lb (90.7 kg)   SpO2 98%   BMI 27.12 kg/m   BP Readings from Last 3 Encounters:  08/08/22 138/88  05/08/22 128/76  05/02/22 (!) 148/101  Wt Readings from Last 3 Encounters:  08/08/22 200 lb (90.7 kg)  05/08/22 202 lb (91.6 kg)  04/24/22 206 lb (93.4 kg)    Physical Exam Vitals reviewed.  HENT:     Nose: Nose normal.  Eyes:     General: No scleral icterus.    Conjunctiva/sclera: Conjunctivae normal.  Cardiovascular:     Rate and Rhythm: Normal rate and regular rhythm.     Pulses: Normal pulses.     Heart sounds: No murmur heard.    No gallop.  Pulmonary:     Effort: Pulmonary effort is normal.     Breath  sounds: No stridor. No wheezing, rhonchi or rales.  Abdominal:     General: Abdomen is flat.     Palpations: There is no mass.     Tenderness: There is no abdominal tenderness. There is no guarding.     Hernia: No hernia is present.  Musculoskeletal:        General: Normal range of motion.     Cervical back: Neck supple.     Right lower leg: No edema.     Left lower leg: No edema.  Lymphadenopathy:     Cervical: No cervical adenopathy.  Skin:    General: Skin is warm and dry.  Neurological:     General: No focal deficit present.     Mental Status: He is alert. Mental status is at baseline.  Psychiatric:        Attention and Perception: Attention and perception normal.        Mood and Affect: Mood is anxious and depressed. Mood is not elated. Affect is flat. Affect is not angry, tearful or inappropriate.        Speech: He is communicative. Speech is delayed. Speech is not rapid and pressured, slurred or tangential.        Behavior: Behavior normal. Behavior is cooperative.        Thought Content: Thought content normal. Thought content is not paranoid or delusional. Thought content does not include homicidal or suicidal ideation.        Cognition and Memory: Cognition is not impaired. Memory is not impaired.     Lab Results  Component Value Date   WBC 6.6 05/02/2022   HGB 14.4 05/02/2022   HCT 43.5 05/02/2022   PLT 168 05/02/2022   GLUCOSE 111 (H) 05/02/2022   CHOL 145 05/02/2021   TRIG 234.0 (H) 05/02/2021   HDL 37.20 (L) 05/02/2021   LDLDIRECT 80.0 05/02/2021   LDLCALC 115 (H) 01/26/2020   ALT 22 05/02/2022   AST 11 (L) 05/02/2022   NA 139 06/15/2022   K 4.7 06/15/2022   CL 103 06/15/2022   CREATININE 1.4 (A) 06/15/2022   BUN 30 (A) 06/15/2022   CO2 28 (A) 06/15/2022   TSH 1.760 05/02/2022   PSA 0.0 10/11/2021   INR 1.12 02/28/2012   HGBA1C 6.0 (H) 02/20/2022   MICROALBUR 0.4 10/14/2008    DG Chest 2 View  Result Date: 05/02/2022 CLINICAL DATA:  Sob. Never  smoker, HTN, Pt reports weakness AND fatigue increasingly recently. Pt also reports weight loss over the past few months. Reports he does take testosterone which doesn't make him feel good. Urology sent over EXAM: CHEST - 2 VIEW COMPARISON:  Chest x-ray 04/10/2022 FINDINGS: The heart and mediastinal contours are within normal limits. No focal consolidation. No pulmonary edema. No pleural effusion. No pneumothorax. No acute osseous abnormality. IMPRESSION: No active cardiopulmonary disease. Electronically Signed   By:  Iven Finn M.D.   On: 05/02/2022 15:58    Assessment & Plan:   Diagnoses and all orders for this visit:  Need for prophylactic vaccination with combined diphtheria-tetanus-pertussis (DTP) vaccine -     Tdap (BOOSTRIX) 5-2.5-18.5 LF-MCG/0.5 injection; Inject 0.5 mLs into the muscle once for 1 dose.  Need for varicella vaccine -     Zoster Vaccine Adjuvanted [facility]) injection; Inject 0.5 mLs into the muscle once for 1 dose.  Essential hypertension- His blood pressure is well-controlled. -     Basic metabolic panel; Future  Hyperlipidemia with target LDL less than 100- Will check a fasting lipid panel. -     Lipid panel; Future  Current severe episode of major depressive disorder without psychotic features without prior episode ([place])- Will continue the combination of duloxetine, trazodone, Seroquel, and methylphenidate.   I am having Jeneen Rinks T. Leitch "[name]" start on Boostrix and Shingrix. I am also having him maintain his cholecalciferol, tadalafil, levocetirizine, RABEprazole, Polyvinyl Alcohol-Povidone (REFRESH OP), Systane Overnight Therapy, zolpidem, pravastatin, DULoxetine, traZODone, methylphenidate, and QUEtiapine.  Meds ordered this encounter  Medications   Tdap (BOOSTRIX) 5-2.5-18.5 LF-MCG/0.5 injection    Sig: Inject 0.5 mLs into the muscle once for 1 dose.    Dispense:  0.5 mL    Refill:  0   Zoster Vaccine Adjuvanted [facility]) injection    Sig: Inject 0.5  mLs into the muscle once for 1 dose.    Dispense:  0.5 mL    Refill:  1     Follow-up: Return in about 3 months (around 11/06/2022).  Scarlette Calico, MD

## 2022-08-12 ENCOUNTER — Other Ambulatory Visit: Payer: Self-pay | Admitting: Internal Medicine

## 2022-08-12 DIAGNOSIS — F5104 Psychophysiologic insomnia: Secondary | ICD-10-CM

## 2022-08-13 ENCOUNTER — Encounter: Payer: Self-pay | Admitting: Internal Medicine

## 2022-08-13 ENCOUNTER — Other Ambulatory Visit: Payer: Self-pay | Admitting: Internal Medicine

## 2022-08-13 NOTE — Telephone Encounter (Signed)
Medication is not on med list../lmb

## 2022-08-19 ENCOUNTER — Encounter: Payer: Self-pay | Admitting: Internal Medicine

## 2022-08-23 ENCOUNTER — Other Ambulatory Visit: Payer: Self-pay | Admitting: Internal Medicine

## 2022-08-23 DIAGNOSIS — F5104 Psychophysiologic insomnia: Secondary | ICD-10-CM

## 2022-08-23 DIAGNOSIS — F322 Major depressive disorder, single episode, severe without psychotic features: Secondary | ICD-10-CM

## 2022-08-30 ENCOUNTER — Ambulatory Visit: Payer: Medicare Other | Admitting: Internal Medicine

## 2022-08-30 ENCOUNTER — Other Ambulatory Visit: Payer: Self-pay | Admitting: Internal Medicine

## 2022-08-30 DIAGNOSIS — F322 Major depressive disorder, single episode, severe without psychotic features: Secondary | ICD-10-CM

## 2022-09-01 NOTE — Progress Notes (Unsigned)
09/03/22- 72 yoM never smoker for sleep evaluation courtesy of Dr Ysidro Evert Hill(Neurology) with concern of Insomnia/ fatigue Medical problem list includes HTN, RBBB, Allergic Rhinitis, GERD, hx Prostate Cancer, CKD3. Kidney Stones, Depression, Hyperlipidemia,  -Concerta 18 mgCR, Seroquel, Trazodone 50,  Epworth score 4 Body weight today-202 lbs Covid vax-5 Phizer Flu vax-had Neurology for fatigue, weakness, weight loss Complains of "low energy" in the morning, until early afternoon. Not clearly sleepy. Not sure if it reflects carry-over from sedating meds at night. With seroquel and trazodone he is sleeping through the night with one bathroom trip. No naps. Avoids caffeine thinking it might worsen depression. Denies ENT surgery, heart or lung disease. Aware of snoring.  Brother died- had hx OSA. Wife has metastatic cancer. CXR 05/02/22- : No active cardiopulmonary disease.  Prior to Admission medications   Medication Sig Start Date End Date Taking? Authorizing Provider  cholecalciferol (VITAMIN D) 1000 UNITS tablet Take 1,000 Units by mouth daily.    Yes [provider]  DULoxetine (CYMBALTA) 60 MG capsule TAKE 1 CAPSULE(60 MG) BY MOUTH DAILY 08/30/22  Yes Janith Lima, MD  hypromellose (SYSTANE OVERNIGHT THERAPY) 0.3 % GEL ophthalmic ointment Place 1 Application into both eyes at bedtime.   Yes [provider]  levocetirizine (XYZAL) 5 MG tablet TAKE 1 TABLET(5 MG) BY MOUTH EVERY EVENING Patient taking differently: Take 5 mg by mouth daily as needed for allergies. 07/31/21  Yes Janith Lima, MD  Polyvinyl Alcohol-Povidone (REFRESH OP) Place 1 drop into both eyes 4 (four) times daily as needed (dryness).   Yes [provider]  pravastatin (PRAVACHOL) 10 MG tablet TAKE 1 TABLET(10 MG) BY MOUTH DAILY 05/24/22  Yes Janith Lima, MD  QUEtiapine (SEROQUEL) 25 MG tablet TAKE 1 TABLET(25 MG) BY MOUTH AT BEDTIME 07/30/22  Yes Janith Lima, MD  RABEprazole (ACIPHEX) 20  MG tablet TAKE ONE TABLET BY MOUTH ONE TIME DAILY Patient taking differently: Take 20 mg by mouth every other day. 10/15/21  Yes Janith Lima, MD  traZODone (DESYREL) 50 MG tablet TAKE 1 TABLET(50 MG) BY MOUTH AT BEDTIME 08/23/22  Yes Janith Lima, MD  methylphenidate (CONCERTA) 18 MG PO CR tablet Take 1 tablet (18 mg total) by mouth daily. 07/30/22   Janith Lima, MD  tadalafil (CIALIS) 20 MG tablet Take 20 mg by mouth daily as needed for erectile dysfunction. Patient not taking: Reported on 09/03/2022 01/05/19   [provider]   Past Medical History:  Diagnosis Date   Anemia    early 20's   Bell's palsy 1977   Blood clot in vein 2005   left leg "blood clot"-treated as OP; no residual   Blood transfusion without reported diagnosis    with appendectomy   Borderline diabetic    no meds   Cancer ([place]) 08/2013   prostate/ had surgery/ no chemo or radiation   Cataract    forming   CKD (chronic kidney disease) stage 3, GFR 30-59 ml/min (HCC) saw dr Justin Mend 6 months ago   on rampiril for kidney function also   Colonic polyp    X3 ,hyperplastic   Diverticulosis of colon    GERD (gastroesophageal reflux disease)    H/O hiatal hernia    Hemorrhoid    Hyperlipidemia    Hypertension    past hx - no meds since 2013 with  adrenal gland surgery   Kidney stone on left side 2013   asymptomatic , incidental finding   Migraine last 6-7  yrs ago   PMH of ; resolved post adrenal adenoma resection   Nephrolithiasis 2013   kidney stone  as incidental finding on imaging   PONV (postoperative nausea and vomiting)    Past Surgical History:  Procedure Laterality Date   adrenal venous sampling  03-2012   ADRENALECTOMY  04/14/2012   Procedure: ADRENALECTOMY;  Surgeon: Ralene Ok, MD;  Location: [facility];  Service: General;  Laterality: Left;  Left Adrenal Excision   APPENDECTOMY  1985   COLONOSCOPY     last 9/12, Dr Fuller Plan; due 2017   ESOPHAGEAL DILATION  2001   Dr Velora Heckler    POLYPECTOMY      X 3   [place] N/A 09/07/2013   Procedure: ROBOTIC ASSISTED LAPAROSCOPIC RADICAL PROSTATECTOMY LEVEL 1;  Surgeon: Dutch Gray, MD;  Location: [facility];  Service: Urology;  Laterality: N/A;   Family History  Problem Relation Age of Onset   Rectal cancer Mother    Colon cancer Mother 100   Heart attack Mother 48   Colon polyps Sister    Lung cancer Sister        smoker   Diabetes Sister    Breast cancer Sister    Alzheimer's disease Sister    Heart failure Brother    Kidney cancer Brother    Prostate cancer Brother 21   Heart disease Brother    Hypertension Brother    Kidney cancer Brother    Diabetes Brother    Heart attack Brother 37   Esophageal cancer Maternal Uncle    Kidney cancer Maternal Uncle    Stomach cancer Neg Hx    Social History   Socioeconomic History   Marital status: Married    Spouse name: Not on file   Number of children: 0   Years of education: Not on file   Highest education level: Not on file  Occupational History   Occupation: retired  Tobacco Use   Smoking status: Never   Smokeless tobacco: Never  Vaping Use   Vaping Use: Never used  Substance and Sexual Activity   Alcohol use: Not Currently    Comment: 1 every 3 mos   Drug use: No   Sexual activity: Yes  Other Topics Concern   Not on file  Social History Narrative   Right handed    Social Determinants of Health   Financial Resource Strain: Low Risk  (01/04/2022)   Overall Financial Resource Strain (CARDIA)    Difficulty of Paying Living Expenses: Not hard at all  Food Insecurity: No Food Insecurity (05/09/2022)   Hunger Vital Sign    Worried About Running Out of Food in the Last Year: Never true    Ran Out of Food in the Last Year: Never true  Transportation Needs: No Transportation Needs (05/09/2022)   PRAPARE - Hydrologist (Medical): No    Lack of Transportation (Non-Medical): No  Physical Activity:  Sufficiently Active (01/04/2022)   Exercise Vital Sign    Days of Exercise per Week: 5 days    Minutes of Exercise per Session: 30 min  Stress: No Stress Concern Present (01/04/2022)   [place]    Feeling of Stress : Not at all  Social Connections: [place] (01/04/2022)   Social Connection and Isolation Panel [NHANES]    Frequency of Communication with Friends and Family: More than three times a week    Frequency of Social Gatherings  with Friends and Family: Not on file    Attends Religious Services: 1 to 4 times per year    Active Member of Clubs or Organizations: Yes    Attends Archivist Meetings: 1 to 4 times per year    Marital Status: Married  Human resources officer Violence: Not At Risk (01/04/2022)   Humiliation, Afraid, Rape, and Kick questionnaire    Fear of Current or Ex-Partner: No    Emotionally Abused: No    Physically Abused: No    Sexually Abused: No   ROS-see HPI   + = positive Constitutional:    +weight loss, night sweats, fevers, chills, fatigue, lassitude. HEENT:    headaches, difficulty swallowing, tooth/dental problems, sore throat,       sneezing, itching, ear ache, nasal congestion, post nasal drip, snoring CV:    chest pain, orthopnea, PND, swelling in lower extremities, anasarca,                                   dizziness, palpitations Resp:   shortness of breath with exertion or at rest.                productive cough,   non-productive cough, coughing up of blood.              change in color of mucus.  wheezing.   Skin:    rash or lesions. GI:  No-   heartburn, indigestion, abdominal pain, nausea, vomiting, diarrhea,                 change in bowel habits, loss of appetite GU: dysuria, change in color of urine, no urgency or frequency.   flank pain. MS:   joint pain, stiffness, decreased range of motion, back pain. Neuro-     nothing unusual Psych:  change in mood or affect. +  depression or +anxiety.   memory loss.  OBJ- Physical Exam General- Alert, Oriented, Affect-appropriate, Distress- none acute, not obese Skin- rash-none, lesions- none, excoriation- none Lymphadenopathy- none Head- atraumatic            Eyes- Gross vision intact, PERRLA, conjunctivae and secretions clear            Ears- Hearing, canals-normal            Nose- Clear, no-Septal dev, mucus, polyps, erosion, perforation             Throat- Mallampati III , mucosa clear , drainage- none, tonsils- atrophic, + teeth Neck- flexible , trachea midline, no stridor , thyroid nl, carotid no bruit Chest - symmetrical excursion , unlabored           Heart/CV- RRR , no murmur , no gallop  , no rub, nl s1 s2                           - JVD+full , edema- none, stasis changes- none, varices- none           Lung- clear to P&A, wheeze- none, cough- none , dullness-none, rub- none           Chest wall-  Abd-  Br/ Gen/ Rectal- Not done, not indicated Extrem- cyanosis- none, clubbing, none, atrophy- none, strength- nl Neuro- grossly intact to observation

## 2022-09-03 ENCOUNTER — Encounter: Payer: Self-pay | Admitting: Internal Medicine

## 2022-09-03 ENCOUNTER — Ambulatory Visit: Payer: Medicare Other | Admitting: Internal Medicine

## 2022-09-03 ENCOUNTER — Ambulatory Visit (INDEPENDENT_AMBULATORY_CARE_PROVIDER_SITE_OTHER): Payer: Medicare Other | Admitting: Internal Medicine

## 2022-09-03 VITALS — BP 130/88 | HR 76 | Ht 72.0 in | Wt 202.2 lb

## 2022-09-03 DIAGNOSIS — R5383 Other fatigue: Secondary | ICD-10-CM | POA: Diagnosis not present

## 2022-09-03 DIAGNOSIS — F5101 Primary insomnia: Secondary | ICD-10-CM

## 2022-09-03 DIAGNOSIS — R0683 Snoring: Secondary | ICD-10-CM

## 2022-09-03 NOTE — Assessment & Plan Note (Signed)
Discussed apathy/ depression, vs sleepiness, vs drug effect, vs weakness. We can look for evidence of obstructive sleep apnea. I don't think he is describing effect of pulmonary hypertension from past pulmonary embolism, but consider Echo in future if appropriate. Plan- schedule sleep study

## 2022-09-03 NOTE — Assessment & Plan Note (Signed)
He indicates this is much better now with seroquel and trazodone.

## 2022-09-03 NOTE — Patient Instructions (Signed)
Order- schedule home sleep test   dx snoring  Please call us about 2 weeks after your sleep study for results and recommendations

## 2022-09-10 ENCOUNTER — Ambulatory Visit: Payer: Medicare Other

## 2022-09-10 DIAGNOSIS — G4733 Obstructive sleep apnea (adult) (pediatric): Secondary | ICD-10-CM

## 2022-09-10 DIAGNOSIS — R0683 Snoring: Secondary | ICD-10-CM

## 2022-09-12 DIAGNOSIS — G4733 Obstructive sleep apnea (adult) (pediatric): Secondary | ICD-10-CM | POA: Diagnosis not present

## 2022-09-25 ENCOUNTER — Telehealth: Payer: Self-pay | Admitting: Internal Medicine

## 2022-09-25 DIAGNOSIS — R0683 Snoring: Secondary | ICD-10-CM

## 2022-09-25 NOTE — Telephone Encounter (Signed)
Pt. Calling to go over HST results and next steps

## 2022-09-25 NOTE — Telephone Encounter (Signed)
Please advise if you have patients sleep study results. If not I will have patient f/u next week

## 2022-09-25 NOTE — Telephone Encounter (Signed)
Home sleep test showed mild sleep apnea averaging 9-10 apneas/ hour. Given his symptoms, I think it is worth treating to see if we can help him feel better.  Order- new DME, new CPAP auto 5-15, mask of choice, humidifier, supplies, AirView/ card  He will need return ov in 31-90 days for CPAP follow-up.

## 2022-09-26 NOTE — Telephone Encounter (Signed)
Went over [employer] results with patient. Advised order for cpap machine has been placed and also have patient scheduled to see Dr. Annamaria Boots in 90 days

## 2022-10-02 ENCOUNTER — Other Ambulatory Visit: Payer: Self-pay | Admitting: Internal Medicine

## 2022-10-02 DIAGNOSIS — K2100 Gastro-esophageal reflux disease with esophagitis, without bleeding: Secondary | ICD-10-CM

## 2022-10-12 DIAGNOSIS — Z8546 Personal history of malignant neoplasm of prostate: Secondary | ICD-10-CM | POA: Diagnosis not present

## 2022-10-19 DIAGNOSIS — Z8546 Personal history of malignant neoplasm of prostate: Secondary | ICD-10-CM | POA: Diagnosis not present

## 2022-10-19 DIAGNOSIS — N5201 Erectile dysfunction due to arterial insufficiency: Secondary | ICD-10-CM | POA: Diagnosis not present

## 2022-10-22 ENCOUNTER — Encounter: Payer: Self-pay | Admitting: Internal Medicine

## 2022-10-22 ENCOUNTER — Telehealth: Payer: Self-pay | Admitting: Internal Medicine

## 2022-10-22 NOTE — Telephone Encounter (Signed)
Patient states [facility] 4-6 weeks out for CPAP machine. Would like advise. Patient phone number is [phone].

## 2022-10-24 NOTE — Telephone Encounter (Signed)
Called and spoke with patient. He stated that he received a call from [facility] a few weeks ago stating that they were running 4-6 weeks out but he hasn't heard anything from them since then. He has tried calling them a few times but is never able to speak with someone directly or receive a return call back. I advised him that I would call [facility] myself to check on his order and call him back. He verbalized understanding.   Called [facility] and was hold for over 10 minutes without speaking with anyone. I will attempt to call back later.

## 2022-10-29 ENCOUNTER — Other Ambulatory Visit: Payer: Self-pay | Admitting: Internal Medicine

## 2022-10-29 DIAGNOSIS — F5104 Psychophysiologic insomnia: Secondary | ICD-10-CM

## 2022-10-29 DIAGNOSIS — F322 Major depressive disorder, single episode, severe without psychotic features: Secondary | ICD-10-CM

## 2022-10-29 NOTE — Telephone Encounter (Signed)
Pt called I bc he still hasn't got any call back or heard anything back.

## 2022-10-30 DIAGNOSIS — N183 Chronic kidney disease, stage 3 unspecified: Secondary | ICD-10-CM | POA: Diagnosis not present

## 2022-10-30 DIAGNOSIS — Z8679 Personal history of other diseases of the circulatory system: Secondary | ICD-10-CM | POA: Diagnosis not present

## 2022-10-30 DIAGNOSIS — N2581 Secondary hyperparathyroidism of renal origin: Secondary | ICD-10-CM | POA: Diagnosis not present

## 2022-10-30 DIAGNOSIS — I129 Hypertensive chronic kidney disease with stage 1 through stage 4 chronic kidney disease, or unspecified chronic kidney disease: Secondary | ICD-10-CM | POA: Diagnosis not present

## 2022-10-30 MED ORDER — QUETIAPINE FUMARATE 25 MG PO TABS: 25.0000 mg | ORAL_TABLET | Freq: Every day | ORAL | 0 refills | Status: DC

## 2022-10-30 NOTE — Telephone Encounter (Signed)
I called [facility] and was on hold for approx 10 min with no answer    I called and spoke with the pt  He states still has not received her CPAP  He states that he spoke with Renee 3 x from [facility] and still has no answers   Can we please try and figure out what the issue is? Thanks!

## 2022-10-30 NOTE — Telephone Encounter (Signed)
Terri with [facility] has advises that this is being worked on right now.  Camelia Eng advises that she will reach out to the patient to confirm that with him.

## 2022-10-30 NOTE — Telephone Encounter (Signed)
[facility] called patient and s/w pt and will have it ready in 2-3 days

## 2022-10-31 ENCOUNTER — Encounter: Payer: Self-pay | Admitting: Internal Medicine

## 2022-11-06 ENCOUNTER — Encounter: Payer: Self-pay | Admitting: Internal Medicine

## 2022-11-06 ENCOUNTER — Ambulatory Visit (INDEPENDENT_AMBULATORY_CARE_PROVIDER_SITE_OTHER): Payer: Medicare Other | Admitting: Internal Medicine

## 2022-11-06 VITALS — BP 128/84 | HR 75 | Temp 98.2°F | Ht 72.0 in | Wt 205.0 lb

## 2022-11-06 DIAGNOSIS — I10 Essential (primary) hypertension: Secondary | ICD-10-CM

## 2022-11-06 DIAGNOSIS — E785 Hyperlipidemia, unspecified: Secondary | ICD-10-CM

## 2022-11-06 DIAGNOSIS — R7303 Prediabetes: Secondary | ICD-10-CM

## 2022-11-06 DIAGNOSIS — G252 Other specified forms of tremor: Secondary | ICD-10-CM | POA: Diagnosis not present

## 2022-11-06 DIAGNOSIS — R4189 Other symptoms and signs involving cognitive functions and awareness: Secondary | ICD-10-CM

## 2022-11-06 DIAGNOSIS — R453 Demoralization and apathy: Secondary | ICD-10-CM

## 2022-11-06 DIAGNOSIS — N1831 Chronic kidney disease, stage 3a: Secondary | ICD-10-CM

## 2022-11-06 DIAGNOSIS — F322 Major depressive disorder, single episode, severe without psychotic features: Secondary | ICD-10-CM | POA: Diagnosis not present

## 2022-11-06 LAB — HEMOGLOBIN A1C: Hgb A1c MFr Bld: 5.9 % (ref 4.6–6.5)

## 2022-11-06 LAB — LDL CHOLESTEROL, DIRECT: Direct LDL: 79 mg/dL

## 2022-11-06 LAB — LIPID PANEL
Cholesterol: 141 mg/dL (ref 0–200)
HDL: 38.4 mg/dL — ABNORMAL LOW (ref >39.00)
NonHDL: 102.41
Total CHOL/HDL Ratio: 4
Triglycerides: 228 mg/dL — ABNORMAL HIGH (ref 0.0–149.0)
VLDL: 45.6 mg/dL — ABNORMAL HIGH (ref 0.0–40.0)

## 2022-11-06 LAB — BASIC METABOLIC PANEL
BUN: 25 mg/dL — ABNORMAL HIGH (ref 6–23)
CO2: 29 mEq/L (ref 19–32)
Calcium: 9.1 mg/dL (ref 8.4–10.5)
Chloride: 105 mEq/L (ref 96–112)
Creatinine, Ser: 1.41 mg/dL (ref 0.40–1.50)
GFR: 49.19 mL/min — ABNORMAL LOW (ref >60.00)
Glucose, Bld: 127 mg/dL — ABNORMAL HIGH (ref 70–99)
Potassium: 5 mEq/L (ref 3.5–5.1)
Sodium: 140 mEq/L (ref 135–145)

## 2022-11-06 MED ORDER — METHYLPHENIDATE HCL ER (OSM) 18 MG PO TBCR
18.0000 mg | EXTENDED_RELEASE_TABLET | Freq: Every day | ORAL | 0 refills | Status: DC
Start: 2022-11-06 — End: 2023-02-06

## 2022-11-06 NOTE — Progress Notes (Signed)
Subjective:  Patient ID: Marcus Crawford, male    DOB: 17-Jun-1951  Age: 72 y.o. MRN: [id number]  CC: Depression and Hyperlipidemia   HPI Marcus Crawford presents for f/up ---  He would like to restart methylphenidate.  He took it a few months ago and said his symptoms improved but it cost too much so he stopped taking it.  He complains of chronic fatigue, daytime somnolence, apathy, anhedonia, and feeling jittery/shaky.  He is active and denies chest pain, shortness of breath, or edema.  Outpatient Medications Prior to Visit  Medication Sig Dispense Refill   cholecalciferol (VITAMIN D) 1000 UNITS tablet Take 1,000 Units by mouth daily.      DULoxetine (CYMBALTA) 60 MG capsule TAKE 1 CAPSULE(60 MG) BY MOUTH DAILY 90 capsule 1   hypromellose (SYSTANE OVERNIGHT THERAPY) 0.3 % GEL ophthalmic ointment Place 1 Application into both eyes at bedtime.     levocetirizine (XYZAL) 5 MG tablet TAKE 1 TABLET(5 MG) BY MOUTH EVERY EVENING (Patient taking differently: Take 5 mg by mouth daily as needed for allergies.) 90 tablet 1   Polyvinyl Alcohol-Povidone (REFRESH OP) Place 1 drop into both eyes 4 (four) times daily as needed (dryness).     pravastatin (PRAVACHOL) 10 MG tablet TAKE 1 TABLET(10 MG) BY MOUTH DAILY 90 tablet 1   QUEtiapine (SEROQUEL) 25 MG tablet Take 1 tablet (25 mg total) by mouth at bedtime. 90 tablet 0   RABEprazole (ACIPHEX) 20 MG tablet TAKE ONE TABLET BY MOUTH ONE TIME DAILY 90 tablet 1   tadalafil (CIALIS) 20 MG tablet Take 20 mg by mouth daily as needed for erectile dysfunction.     traZODone (DESYREL) 50 MG tablet TAKE 1 TABLET(50 MG) BY MOUTH AT BEDTIME 90 tablet 0   No facility-administered medications prior to visit.    ROS Review of Systems  Constitutional:  Positive for fatigue. Negative for appetite change, chills, diaphoresis and unexpected weight change.  HENT: Negative.    Eyes: Negative.   Respiratory: Negative.  Negative for cough, chest tightness, shortness of  breath and wheezing.   Cardiovascular:  Negative for chest pain, palpitations and leg swelling.  Gastrointestinal:  Negative for abdominal pain, constipation, diarrhea, nausea and vomiting.  Endocrine: Negative.   Genitourinary: Negative.  Negative for difficulty urinating and dysuria.  Musculoskeletal: Negative.   Skin: Negative.   Neurological:  Positive for tremors. Negative for dizziness, weakness, light-headedness and numbness.  Psychiatric/Behavioral:  Positive for decreased concentration and dysphoric mood. Negative for agitation, behavioral problems, confusion, sleep disturbance and suicidal ideas. The patient is not nervous/anxious and is not hyperactive.     Objective:  BP 128/84 (BP Location: Left Arm, Patient Position: Sitting, Cuff Size: Large)   Pulse 75   Temp 98.2 F (36.8 C) (Oral)   Ht 6' (1.829 m)   Wt 205 lb (93 kg)   SpO2 95%   BMI 27.80 kg/m   BP Readings from Last 3 Encounters:  11/06/22 128/84  09/03/22 130/88  08/08/22 138/88    Wt Readings from Last 3 Encounters:  11/06/22 205 lb (93 kg)  09/03/22 202 lb 3.2 oz (91.7 kg)  08/08/22 200 lb (90.7 kg)    Physical Exam Vitals reviewed.  HENT:     Mouth/Throat:     Mouth: Mucous membranes are moist.  Eyes:     General: No scleral icterus.    Conjunctiva/sclera: Conjunctivae normal.  Cardiovascular:     Rate and Rhythm: Normal rate and regular rhythm.  Heart sounds: No murmur heard.    No gallop.  Pulmonary:     Effort: Pulmonary effort is normal.     Breath sounds: No stridor. No wheezing, rhonchi or rales.  Abdominal:     General: Abdomen is flat.     Palpations: There is no mass.     Tenderness: There is no abdominal tenderness. There is no guarding.     Hernia: No hernia is present.  Musculoskeletal:        General: Normal range of motion.     Cervical back: Neck supple.     Right lower leg: No edema.     Left lower leg: No edema.  Lymphadenopathy:     Cervical: No cervical  adenopathy.  Skin:    General: Skin is warm and dry.  Neurological:     General: No focal deficit present.     Mental Status: He is alert. Mental status is at baseline.     Comments: He has facial twitches. There is a lack of expression on his face.  Psychiatric:        Mood and Affect: Mood normal.        Behavior: Behavior normal.     Lab Results  Component Value Date   WBC 6.6 05/02/2022   HGB 14.4 05/02/2022   HCT 43.5 05/02/2022   PLT 168 05/02/2022   GLUCOSE 127 (H) 11/06/2022   CHOL 141 11/06/2022   TRIG 228.0 (H) 11/06/2022   HDL 38.40 (L) 11/06/2022   LDLDIRECT 79.0 11/06/2022   LDLCALC 115 (H) 01/26/2020   ALT 22 05/02/2022   AST 11 (L) 05/02/2022   NA 140 11/06/2022   K 5.0 11/06/2022   CL 105 11/06/2022   CREATININE 1.41 11/06/2022   BUN 25 (H) 11/06/2022   CO2 29 11/06/2022   TSH 2.11 11/06/2022   PSA 0.0 10/11/2021   INR 1.12 02/28/2012   HGBA1C 5.9 11/06/2022   MICROALBUR 0.4 10/14/2008    DG Chest 2 View  Result Date: 05/02/2022 CLINICAL DATA:  Sob. Never smoker, HTN, Pt reports weakness AND fatigue increasingly recently. Pt also reports weight loss over the past few months. Reports he does take testosterone which doesn't make him feel good. Urology sent over EXAM: CHEST - 2 VIEW COMPARISON:  Chest x-ray 04/10/2022 FINDINGS: The heart and mediastinal contours are within normal limits. No focal consolidation. No pulmonary edema. No pleural effusion. No pneumothorax. No acute osseous abnormality. IMPRESSION: No active cardiopulmonary disease. Electronically Signed   By: Tish Frederickson M.D.   On: 05/02/2022 15:58    Assessment & Plan:    Essential hypertension- His blood pressure is well-controlled. -     Thyroid Panel With TSH; Future -     Basic metabolic panel  Stage 3a chronic kidney disease (HCC)- Renal function is stable.  Hyperlipidemia with target LDL less than 100 - LDL goal achieved. Doing well on the statin  -     Thyroid Panel With  TSH; Future -     Lipid panel; Future  Apathy -     Thyroid Panel With TSH; Future -     Methylphenidate HCl ER (OSM); Take 1 tablet (18 mg total) by mouth daily.  Dispense: 30 tablet; Refill: 0  Current severe episode of major depressive disorder without psychotic features without prior episode (HCC) -     Thyroid Panel With TSH; Future -     Methylphenidate HCl ER (OSM); Take 1 tablet (18 mg total) by mouth daily.  Dispense: 30 tablet; Refill: 0  Pseudodementia -     Thyroid Panel With TSH; Future -     Methylphenidate HCl ER (OSM); Take 1 tablet (18 mg total) by mouth daily.  Dispense: 30 tablet; Refill: 0  Coarse tremors- I am concerned about Parkinson's disease. -     Ambulatory referral to Neurology -     Thyroid Panel With TSH; Future  Prediabetes -     Hemoglobin A1c; Future  Other orders -     LDL cholesterol, direct     Follow-up: Return in about 3 months (around 02/06/2023).  Sanda Linger, MD

## 2022-11-06 NOTE — Patient Instructions (Signed)
Amphetamine Tablets What is this medication? AMPHETAMINE (am FET a meen) treats attention-deficit hyperactivity disorder (ADHD). It works by improving focus and reducing impulsive behavior. It may also be used to treat narcolepsy. It works by promoting wakefulness. It can be used short-term to promote weight loss when other therapies have not worked or cannot be tolerated. It belongs to a group of medications called stimulants. This medicine may be used for other purposes; ask your health care provider or pharmacist if you have questions. COMMON BRAND NAME(S): Evekeo What should I tell my care team before I take this medication? They need to know if you have any of these conditions: Anxiety or panic attacks Circulation problems in fingers and toes Glaucoma Hardening or blockages of the arteries or heart blood vessels Heart disease or a heart defect High blood pressure History of stroke History of substance use disorder Kidney disease Liver disease Mental health condition Seizures Suicidal thoughts, plans, or attempt by you or a family member Thyroid disease Tourette's syndrome An unusual or allergic reaction to dextroamphetamine, other amphetamines, other medications, foods, dyes, or preservatives Pregnant or trying to get pregnant Breast-feeding How should I use this medication? Take this medication by mouth with water. Take it as directed on the prescription label at the same time every day. If you are taking this medication to help you lose weight, take as directed 30 to 60 minutes before meals. Keep taking it unless your care team tells you to stop. A special MedGuide will be given to you by the pharmacist with each prescription and refill. Be sure to read this information carefully each time. Talk to your care team about the use of this medication in children. While it may be prescribed for children as young as 3 years for selected conditions, precautions do apply. Overdosage: If  you think you have taken too much of this medicine contact a poison control center or emergency room at once. NOTE: This medicine is only for you. Do not share this medicine with others. What if I miss a dose? If you miss a dose, take it as soon as you can. If it is almost time for your next dose, take only that dose. Do not take double or extra doses. What may interact with this medication? Do not take this medication with any of the following: Linezolid MAOIs, such as Marplan, Nardil, and Parnate Methylene blue This medication may also interact with the following: Acetazolamide Alcohol Ascorbic acid Certain medications for depression, anxiety, or other mental health conditions Certain medications for migraines, such as sumatriptan Guanethidine Opioids Reserpine Sodium bicarbonate St. John's wort Thiazide diuretics, such as chlorothiazide Tryptophan This list may not describe all possible interactions. Give your health care provider a list of all the medicines, herbs, non-prescription drugs, or dietary supplements you use. Also tell them if you smoke, drink alcohol, or use illegal drugs. Some items may interact with your medicine. What should I watch for while using this medication? Visit your care team for regular checks on your progress. Tell your care team if your symptoms do not start to get better or if they get worse. This medication requires a new prescription from your care team every time it is filled at the pharmacy. This medication can be abused and cause your brain and body to depend on it after high doses or long term use. Your care team will assess your risk and monitor you closely during treatment. Long term use of this medication may cause your brain and  body to depend on it. You may be able to take breaks from this medication during weekends, holidays, or summer vacations. Talk to your care team about what works for you. If your care team wants you to stop this medication  permanently, the dose may be slowly lowered over time to reduce the risk of side effects. Tell your care team if this medication loses its effects, or if you feel you need to take more than the prescribed amount. Do not change your dose without talking to your care team. Do not take this medication close to bedtime. It may prevent you from sleeping. Loss of appetite is common when starting this medication. Eating small, frequent meals or snacks can help. Talk to your care team if appetite loss persists. Children should have height and weight checked often while taking this medication. Tell your care team right away if you notice unexplained wounds on your fingers and toes while taking this medication. You should also tell your care team if you experience numbness or pain, changes in the skin color, or sensitivity to temperature in your fingers or toes. Contact your care team right away if you have an erection that lasts longer than 4 hours or if it becomes painful. This may be a sign of a serious problem and must be treated right away to prevent permanent damage. What side effects may I notice from receiving this medication? Side effects that you should report to your care team as soon as possible: Allergic reactions--skin rash, itching, hives, swelling of the face, lips, tongue, or throat Heart attack--pain or tightness in the chest, shoulders, arms, or jaw, nausea, shortness of breath, cold or clammy skin, feeling faint or lightheaded Heart rhythm changes--fast or irregular heartbeat, dizziness, feeling faint or lightheaded, chest pain, trouble breathing Increase in blood pressure Irritability, confusion, fast or irregular heartbeat, muscle stiffness, twitching muscles, sweating, high fever, seizure, chills, vomiting, diarrhea, which may be signs of serotonin syndrome Mood and behavior changes--anxiety, nervousness, confusion, hallucinations, irritability, hostility, thoughts of suicide or self-harm,  worsening mood, feelings of depression Prolonged or painful erection Raynaud syndrome--cool, numb, or painful fingers or toes that may change color from pale, to blue, to red Seizures Stroke--sudden numbness or weakness of the face, arm, or leg, trouble speaking, confusion, trouble walking, loss of balance or coordination, dizziness, severe headache, change in vision Side effects that usually do not require medical attention (report these to your care team if they continue or are bothersome): Dry mouth Headache Loss of appetite with weight loss Nausea Stomach pain Trouble sleeping This list may not describe all possible side effects. Call your doctor for medical advice about side effects. You may report side effects to FDA at 1-800-FDA-1088. Where should I keep my medication? Keep out of the reach of children and pets. This medication can be abused. Keep it in a safe place to protect it from theft. Do not share it with anyone. It is only for you. Selling or giving away this medication is dangerous and against the law. Store at room temperature between 20 and 25 degrees C (68 and 77 degrees F). Keep container tightly closed. Get rid of any unused medication after the expiration date. This medication may cause harm and death if it is taken by other adults, children, or pets. It is important to get rid of the medication as soon as you no longer need it or it is expired. You can do this in two ways: Take the medication to a medication  take-back program. Check with your pharmacy or law enforcement to find a location. If you cannot return the medication, check the label or package insert to see if the medication should be thrown out in the garbage or flushed down the toilet. If you are not sure, ask your care team. If it is safe to put it in the trash, take the medication out of the container. Mix the medication with cat litter, dirt, coffee grounds, or other unwanted substance. Seal the mixture in a bag  or container. Put it in the trash. NOTE: This sheet is a summary. It may not cover all possible information. If you have questions about this medicine, talk to your doctor, pharmacist, or health care provider.  2023 Elsevier/Gold Standard (2022-05-16 00:00:00)

## 2022-11-07 LAB — THYROID PANEL WITH TSH
Free Thyroxine Index: 1.7 (ref 1.4–3.8)
T3 Uptake: 33 % (ref 22–35)
T4, Total: 5.1 ug/dL (ref 4.9–10.5)
TSH: 2.11 mIU/L (ref 0.40–4.50)

## 2022-11-08 MED ORDER — PRAVASTATIN SODIUM 10 MG PO TABS: 10.0000 mg | ORAL_TABLET | Freq: Every day | ORAL | 1 refills | Status: DC

## 2022-11-13 NOTE — Progress Notes (Unsigned)
NEUROLOGY FOLLOW UP OFFICE NOTE  Marcus Crawford [id number]  Subjective:  Marcus Crawford is a 72 y.o. year old male with a history of HTN, HLD, CKD, pre-diabetes, vit D deficiency, prostate cancer, bursitis of right hip, RBBB, Bell's Palsy c/b chronic right face droop, hypogonadism, and fatigue who we last saw on 04/24/22.  To briefly review: Initial consultation (04/16/22): Patient has been having progressive weakness since May or June of 2023. He first noticed that when he did anything, he would get tired very quickly. He denies weakness at first, but just wouldn't have energy to go very long. He would have to rest to get his energy up again. He may have started noticing his stride while walking was changing for the last year. He is not sure how he was walking different, but noticed he was.   Patient feels weak in his arms and legs. He mentioned that about August 2023 he noticed leg weakness and September 2023 he started noticing his arms felt funny and weak. He does not notice fluctuating of his weakness. He feels his strength is so poor and his fatigue is so much that he ends up laying around than being upright. He has lost 15-20 pounds over this time period as well. He says he has no appetite. He does not feel he can think straight. He feels like he has brain fog. He also feels like his vision is affected. He has dry eyes and feels like his vision is poor. He denies double vision. He also feels like he cannot sit still. He is restless. He mentions that his hand writing is poorer than previous and perhaps smaller.   He is sleeping very poorly. He takes ambien to help him sleep but he is still only sleeping from 9:30pm to 3:30am (~6 hours per night). He feels rested but only for a short period of time. This has been going on for about 2 months. He thinks he snores a lot. He had a sleep study scheduled a couple of weeks ago, but this was not completed because per patient, he wasn't in good enough  health, so it was cancelled.    He endorses poor mood as well. His is upset about not only his condition but also his wife. She is going through cancer treatments.   Patient states he feels so bad that he feels like he is dying.   Current MG like symptoms: Ptosis: He thinks he occasionally has droopy eyelids, but maybe this has been going on for a long time. Double vision: Not clear Speech: Patient feels like his voice is changing. He feels hoarse. This comes and goes. He thinks it is worse when he gets upset or if he is tired or using his voice a lot. Chewing: None Swallowing: None Breathing: None Arm strength: Feels weak. Harder to brush teeth. Not dropping things.  Leg strength: Feels weak. Feels like he is taking shorter chopper steps per patient.    He currently takes vitamin D 1000 IU per day for several years. He takes no other vitamins.   The patient has not had similar episodes of symptoms in the past.    He endorses some tingling on the bottom of his feet occasionally and has occasional cramping.    Any change in urine color, especially after exertion/physical activity? No   Pseudobulbar affect is absent, but patient does endorse being more emotional.   The patient has not  noticed any recent skin rashes nor does he  report any constitutional symptoms like fever, night sweats. He endorses anorexia and weight loss as above.   EtOH use: None  Restrictive diet? No Family history of neuropathy/myopathy/NM disease? None   Patient presented to the ED on 04/10/22 for weakness. Per documentation, symptoms have been present for the last year, but progressed over the last month. He has lost 15 pounds over the last few months due to poor appetite. He feels very fatigued. There was concern for myasthenia gravis, so neurology was consulted. Neurology was not concerned for MG crisis, so sent the antibodies and recommended outpatient follow up.  04/24/22: AchR binding abs and striated  muscle abs were negative. B1 was elevated to 45.   Patient continues to feel very fatigued and weak. We spoke by phone on 04/23/22, and patient was very concerned about his symptoms, so this follow up appointment was made.   Patient today says he does not feel depressed. He was prescribed a medication for depression but did not take it.   He also has poor sleep. He was given [place] which he took last night and slept 7 hours. He is worried about getting addicted to the medication though.  Most recent Assessment and Plan (04/24/22): This is Marcus Crawford, a 72 y.o. male here for follow up of extreme fatigue, subjective weakness, and poor sleep and mood. Again today I find no evidence of objective weakness. His AChR binding abs were negative, which argues against myasthenia gravis. Patient continues to feel like he is dying though, so I will expand my work up to include the rest of the MG antibodies and an EMG with repetitive nerve stimulation. My feeling is that his symptoms are more related to poor sleep and mood.   Plan: -Lab work: AChR abs (blocking and modulating), MuSK abs -EMG w/ RNS (R > L) -Sleep medicine referral for poor sleep  Since their last visit: Labs returned normal. Patient cancelled EMG.   He did see sleep medicine. He is still awaiting CPAP.  He saw his PCP on 11/06/22 with similar complaints of fatigue, daytime sleepiness, apathy, anhedonia, and feeling shaky. Course tremors were noted, so patient was referred back to me today.   Patient mentions that he has noticed this more with stress. He notices it occurs with action. When he is putting down his glasses or eating cereal. When he thinks about it, he will notice it more (like thinking about it makes it worse). He was recently restarted on methylphenidate and thinks this has helped how he is feeling and tremors.  He denies freezing. He denies rest tremor. He denies significant imbalance or falls. He endorses poor smell for  20-30 years. He may have had some kicking in his sleep in the past, but nothing currently that sounds like REM sleep behavior.  He has no family history of tremor.  Relevant current medications: -Cymbalta 60 mg daily (tremor in 2-3%) -Methylphenidate 18 mg daily (jitteriness in 4%, tremor in 3%) -Seroquel 25 mg daily (parkinsonism in < 6%, tremor in 2%) -Trazodone 50 mg daily (nervousness in 15%, tremor in 3-5%)   MEDICATIONS:  Outpatient Encounter Medications as of 11/14/2022  Medication Sig   cholecalciferol (VITAMIN D) 1000 UNITS tablet Take 1,000 Units by mouth daily.    DULoxetine (CYMBALTA) 60 MG capsule TAKE 1 CAPSULE(60 MG) BY MOUTH DAILY   hypromellose (SYSTANE OVERNIGHT THERAPY) 0.3 % GEL ophthalmic ointment Place 1 Application into both eyes at bedtime.   levocetirizine (XYZAL) 5 MG tablet TAKE 1  TABLET(5 MG) BY MOUTH EVERY EVENING (Patient taking differently: Take 5 mg by mouth daily as needed for allergies.)   methylphenidate (CONCERTA) 18 MG PO CR tablet Take 1 tablet (18 mg total) by mouth daily.   Polyvinyl Alcohol-Povidone (REFRESH OP) Place 1 drop into both eyes 4 (four) times daily as needed (dryness).   pravastatin (PRAVACHOL) 10 MG tablet Take 1 tablet (10 mg total) by mouth daily.   QUEtiapine (SEROQUEL) 25 MG tablet Take 1 tablet (25 mg total) by mouth at bedtime.   RABEprazole (ACIPHEX) 20 MG tablet TAKE ONE TABLET BY MOUTH ONE TIME DAILY   tadalafil (CIALIS) 20 MG tablet Take 20 mg by mouth daily as needed for erectile dysfunction.   traZODone (DESYREL) 50 MG tablet TAKE 1 TABLET(50 MG) BY MOUTH AT BEDTIME   No facility-administered encounter medications on file as of 11/14/2022.    PAST MEDICAL HISTORY: Past Medical History:  Diagnosis Date   Anemia    early 20's   Bell's palsy 1977   Blood clot in vein 2005   left leg "blood clot"-treated as OP; no residual   Blood transfusion without reported diagnosis    with appendectomy   Borderline diabetic    no  meds   Cancer (HCC) 08/2013   prostate/ had surgery/ no chemo or radiation   Cataract    forming   CKD (chronic kidney disease) stage 3, GFR 30-59 ml/min (HCC) saw dr Hyman Hopes 6 months ago   on rampiril for kidney function also   Colonic polyp    X3 ,hyperplastic   Diverticulosis of colon    GERD (gastroesophageal reflux disease)    H/O hiatal hernia    Hemorrhoid    Hyperlipidemia    Hypertension    past hx - no meds since 2013 with  adrenal gland surgery   Kidney stone on left side 2013   asymptomatic , incidental finding   Migraine last 6-7 yrs ago   PMH of ; resolved post adrenal adenoma resection   Nephrolithiasis 2013   kidney stone  as incidental finding on imaging   PONV (postoperative nausea and vomiting)     PAST SURGICAL HISTORY: Past Surgical History:  Procedure Laterality Date   adrenal venous sampling  03-2012   ADRENALECTOMY  04/14/2012   Procedure: ADRENALECTOMY;  Surgeon: Axel Filler, MD;  Location: [facility];  Service: General;  Laterality: Left;  Left Adrenal Excision   APPENDECTOMY  1985   COLONOSCOPY     last 9/12, Dr Russella Dar; due 2017   ESOPHAGEAL DILATION  2001   Dr Corinda Gubler   POLYPECTOMY      X 3   ROBOT ASSISTED LAPAROSCOPIC RADICAL PROSTATECTOMY N/A 09/07/2013   Procedure: ROBOTIC ASSISTED LAPAROSCOPIC RADICAL PROSTATECTOMY LEVEL 1;  Surgeon: Crecencio Mc, MD;  Location: [facility];  Service: Urology;  Laterality: N/A;    ALLERGIES: Allergies  Allergen Reactions   Penicillins Rash    Rash (RN clarified with pt) No SOB/swelling   Sulfa Antibiotics Rash    FAMILY HISTORY: Family History  Problem Relation Age of Onset   Rectal cancer Mother    Colon cancer Mother 47   Heart attack Mother 73   Colon polyps Sister    Lung cancer Sister        smoker   Diabetes Sister    Breast cancer Sister    Alzheimer's disease Sister    Heart failure Brother    Kidney cancer Brother    Prostate cancer Brother 25  Heart disease Brother    Hypertension  Brother    Kidney cancer Brother    Diabetes Brother    Heart attack Brother 55   Esophageal cancer Maternal Uncle    Kidney cancer Maternal Uncle    Stomach cancer Neg Hx     SOCIAL HISTORY: Social History   Tobacco Use   Smoking status: Never   Smokeless tobacco: Never  Vaping Use   Vaping Use: Never used  Substance Use Topics   Alcohol use: Not Currently    Comment: 1 every 3 mos   Drug use: No   Social History   Social History Narrative   Right handed        Are you currently employed ?    What is your current occupation?retired   Do you live at home alone?   Who lives with you?    What type of home do you live in: 1 story or 2 story?           Objective:  Vital Signs:  BP (!) 161/82   Pulse 74   Ht 6' (1.829 m)   Wt 204 lb (92.5 kg)   SpO2 98%   BMI 27.67 kg/m   General: No acute distress.  Patient appears well-groomed.   Head:  Normocephalic/atraumatic Neck: supple Lungs: Non-labored breathing on room air  Affect: Flat affect  Neurological Exam: Mental status: alert and oriented, speech fluent and not dysarthric, language intact.  Cranial nerves: CN I: not tested CN II: pupils equal, round and reactive to light, visual fields intact CN III, IV, VI:  full range of motion, no nystagmus, no ptosis CN V: facial sensation intact. CN VII: upper and lower face symmetric CN VIII: hearing intact CN IX, X: gag intact, uvula midline CN XI: sternocleidomastoid and trapezius muscles intact CN XII: tongue midline  Bulk & Tone: normal, no fasciculations. Motor:  muscle strength 5/5 throughout Deep Tendon Reflexes:  2+ throughout  Sensation:  Pinprick sensation intact. Coordination: Able to pour water with minimal to no spilling Finger to nose testing:  Without dysmetria.  Mild intention tremor in left upper limb Finger tapping: Some incoordination but no decrementing Gait:  Normal station and stride. No freezing. No en-bloc turns.   Labs and Imaging  review: New results: 11/06/22: BMP unremarkable TSH wnl HbA1c: 5.9  Vit D (06/15/22): 48.9  04/24/22: MuSK abs negative AChR blocking abs negative, modulating abs negative  Previously reviewed results: B1: 45 AChR binding abs: negative Striated muscle abs: negative   Normal or unremarkable: CK (325), Mg, TSH, free T4, CBC, Vit D Testosterone: low at 132 (264 is lower limit of normal) BMP with mildly elevated glucose, elevated Cr (1.41) HbA1c: 6.0 B12: 409    MRI brain wo contrast (04/11/22): FINDINGS: Brain: No restricted diffusion to suggest acute or subacute infarct. No acute hemorrhage, mass, mass effect, midline shift. No hydrocephalus or extra-axial collection. No hemosiderin deposition to suggest remote hemorrhage. Scattered T2 hyperintense signal in the periventricular white matter, likely the sequela of mild chronic small vessel ischemic disease. Cerebral volume is within normal limits for age.   Vascular: Normal arterial flow voids.   Skull and upper cervical spine: Normal marrow signal.   Sinuses/Orbits: No acute finding.   Other: The mastoids are well aerated.   IMPRESSION: No acute intracranial process. No evidence of acute or subacute infarct.   MRI cervical spine wo contrast (04/11/22): FINDINGS: Alignment: Normal   Vertebrae: No fracture, evidence of discitis, or bone lesion.T1  hemangioma.   Cord: Normal signal and morphology.   Posterior Fossa, vertebral arteries, paraspinal tissues: Negative.   Disc levels:   C1-2: Unremarkable.   C2-3: Normal disc space and facet joints. There is no spinal canal stenosis. No neural foraminal stenosis.   C3-4: Small disc bulge with mild facet hypertrophy. There is no spinal canal stenosis. Mild bilateral neural foraminal stenosis.   C4-5: Moderate right and mild left facet hypertrophy. There is no spinal canal stenosis. No neural foraminal stenosis.   C5-6: Normal disc space and facet joints. There  is no spinal canal stenosis. No neural foraminal stenosis.   C6-7: Small disc bulge with endplate spurring. There is no spinal canal stenosis. No neural foraminal stenosis.   C7-T1: Normal disc space and facet joints. There is no spinal canal stenosis. No neural foraminal stenosis.   IMPRESSION: 1. No acute abnormality of the cervical spine. 2. Mild bilateral neural foraminal stenosis at C3-4. 3. No spinal canal stenosis.  Assessment/Plan:  This is Marcus Crawford, a 72 y.o. male with shakiness/tremor, especially with movement and enhanced with stress. Etiology is unclear, but there is no current evidence of parkinsonism. This could be essential tremor vs enhance physiologic tremor with contributions from stress or some of his medications (see above in HPI). Overall, patient does not find symptoms bothersome and they do not prevent him from activities such as eating or drinking.  Plan: -Discussed possible diagnosis and treatment. Patient is not bothered by symptoms currently, so would like to defer. -Patient will reach out with worsening symptoms or new concerns  Return to clinic as needed  Total time spent reviewing records, interview, history/exam, documentation, and coordination of care on day of encounter:  25 min  Jacquelyne Balint, MD

## 2022-11-14 ENCOUNTER — Telehealth: Payer: Self-pay | Admitting: Internal Medicine

## 2022-11-14 ENCOUNTER — Ambulatory Visit (INDEPENDENT_AMBULATORY_CARE_PROVIDER_SITE_OTHER): Payer: Medicare Other | Admitting: Neurology

## 2022-11-14 ENCOUNTER — Encounter: Payer: Self-pay | Admitting: Neurology

## 2022-11-14 VITALS — BP 161/82 | HR 74 | Ht 72.0 in | Wt 204.0 lb

## 2022-11-14 DIAGNOSIS — G252 Other specified forms of tremor: Secondary | ICD-10-CM | POA: Diagnosis not present

## 2022-11-14 DIAGNOSIS — G4700 Insomnia, unspecified: Secondary | ICD-10-CM

## 2022-11-14 DIAGNOSIS — Z72820 Sleep deprivation: Secondary | ICD-10-CM | POA: Diagnosis not present

## 2022-11-14 DIAGNOSIS — F32A Depression, unspecified: Secondary | ICD-10-CM

## 2022-11-14 NOTE — Patient Instructions (Signed)
I see no evidence of parkinsonism today. Your tremor is likely benign, meaning we do not have to treat unless it is bother you, which you said today that it is not.  Let me know if it is worsening as we can also try treatment.  Follow up with me as needed.  The physicians and staff at [facility] are committed to providing excellent care. You may receive a survey requesting feedback about your experience at our office. We strive to receive "very good" responses to the survey questions. If you feel that your experience would prevent you from giving the office a "very good " response, please contact our office to try to remedy the situation. We may be reached at [phone]. Thank you for taking the time out of your busy day to complete the survey.  Jacquelyne Balint, MD [facility]

## 2022-11-14 NOTE — Telephone Encounter (Signed)
Pt Calling stating he is sick and upfed with [facility]. He has called them several times and a lady named Terri yesterday said she would look into it and call him back but no call back ever came. He is calling us to see if we can do anything to assist. I adv PT I see an order placed AND confirmed by Terri at [place].  Please call PT to advise next step to resolution. TY.   His # is [phone]

## 2022-11-14 NOTE — Telephone Encounter (Signed)
Spoke with Terri at [facility] she states she was unable to call patient yesterday. She states patients order for cpap machine is being process and patient will be next to get called. I called patient to advised and ask that he call our office back if he doesn't hear from them by Friday. He verbalized understanding. NFN

## 2022-11-19 ENCOUNTER — Other Ambulatory Visit: Payer: Self-pay | Admitting: Internal Medicine

## 2022-11-19 DIAGNOSIS — F322 Major depressive disorder, single episode, severe without psychotic features: Secondary | ICD-10-CM

## 2022-11-19 DIAGNOSIS — F5104 Psychophysiologic insomnia: Secondary | ICD-10-CM

## 2022-12-10 ENCOUNTER — Other Ambulatory Visit: Payer: Self-pay | Admitting: Internal Medicine

## 2022-12-10 DIAGNOSIS — E785 Hyperlipidemia, unspecified: Secondary | ICD-10-CM

## 2022-12-18 ENCOUNTER — Encounter: Payer: Self-pay | Admitting: Neurology

## 2022-12-19 ENCOUNTER — Other Ambulatory Visit: Payer: Self-pay

## 2022-12-19 DIAGNOSIS — F32A Depression, unspecified: Secondary | ICD-10-CM

## 2022-12-27 ENCOUNTER — Ambulatory Visit: Payer: Medicare Other | Admitting: Internal Medicine

## 2023-01-04 ENCOUNTER — Telehealth: Payer: Self-pay

## 2023-01-04 NOTE — Telephone Encounter (Signed)
Called [phone] to check on status of referral. Office is closed. Will have to call back on Monday.

## 2023-01-04 NOTE — Telephone Encounter (Signed)
-----   Message from Dayna Ramus, LPN sent at 07/07/1094 12:03 PM EDT ----- Regarding: RE: Referrals Looks like it was sent to this one   Phone:  [phone]  Address:  [address]  [place], [place] [address] ----- Message ----- From: Odis Hollingshead, CMA Sent: 01/04/2023  11:18 AM EDT To: Dayna Ramus, LPN Subject: FW: Referrals                                  Hey Heather- Can you look at this patients behavioral health referral and see if you can see where the referral was sent? I am trying to find the location to call and check status but from the looks of it it looks like it was sent no where? Let me know if you can see anything or if I should just place a new referral.  ----- Message ----- From: Antony Madura, MD Sent: 01/04/2023   9:07 AM EDT To: Odis Hollingshead, CMA Subject: Referrals                                      Avante Carneiro,  I had referrals placed for this patient a couple of weeks ago for psychiatry and psychology. Patient says he has not heard from anyone. Could you look into this for me?  Thank you,  Riki Rusk

## 2023-01-07 ENCOUNTER — Ambulatory Visit (INDEPENDENT_AMBULATORY_CARE_PROVIDER_SITE_OTHER): Payer: Medicare Other

## 2023-01-07 VITALS — Ht 72.0 in | Wt 204.0 lb

## 2023-01-07 DIAGNOSIS — Z0000 Encounter for general adult medical examination without abnormal findings: Secondary | ICD-10-CM | POA: Diagnosis not present

## 2023-01-07 NOTE — Telephone Encounter (Signed)
Called [phone]. Spoke to referral specialist and she informed me that there is a referral paced and patient will need to call the [facility] office at [phone] for an appointment.   Called Marcus Crawford and informed him that his referral has been received and he will ned to call [phone] for an appointment. Patient is aware to give our office a call if he has any issues.

## 2023-01-07 NOTE — Progress Notes (Addendum)
Subjective:   Marcus Crawford is a 72 y.o. male who presents for Medicare Annual/Subsequent preventive examination.  Visit Complete: Virtual  I connected with  Marcus Crawford on 01/07/23 by a audio enabled telemedicine application and verified that I am speaking with the correct person using two identifiers.  Patient Location: Home  Provider Location: Office/Clinic  I discussed the limitations of evaluation and management by telemedicine. The patient expressed understanding and agreed to proceed.  Patient Medicare AWV questionnaire was completed by the patient on 01/02/2022; I have confirmed that all information answered by patient is correct and no changes since this date.  Review of Systems     Cardiac Risk Factors include: advanced age (>50men, >110 women);male gender     Objective:    Today's Vitals   01/07/23 1037  Weight: 204 lb (92.5 kg)  Height: 6' (1.829 m)   Body mass index is 27.67 kg/m.     01/07/2023   10:48 AM 05/02/2022    3:30 PM 04/24/2022   10:51 AM 04/16/2022    2:51 PM 04/10/2022    2:16 PM 04/05/2022    5:58 PM 01/04/2022    3:04 PM  Advanced Directives  Does Patient Have a Medical Advance Directive? Yes No No Yes No No Yes  Type of Estate agent of [place];Living will      Living will;Healthcare Power of Attorney  Does patient want to make changes to medical advance directive?       No - Patient declined  Copy of Healthcare Power of Attorney in Chart? No - copy requested      No - copy requested  Would patient like information on creating a medical advance directive?  No - Patient declined   No - Patient declined No - Patient declined     Current Medications (verified) Outpatient Encounter Medications as of 01/07/2023  Medication Sig   cholecalciferol (VITAMIN D) 1000 UNITS tablet Take 1,000 Units by mouth daily.    DULoxetine (CYMBALTA) 60 MG capsule TAKE 1 CAPSULE(60 MG) BY MOUTH DAILY   hypromellose (SYSTANE OVERNIGHT THERAPY)  0.3 % GEL ophthalmic ointment Place 1 Application into both eyes at bedtime.   levocetirizine (XYZAL) 5 MG tablet TAKE 1 TABLET(5 MG) BY MOUTH EVERY EVENING (Patient taking differently: Take 5 mg by mouth daily as needed for allergies.)   methylphenidate (CONCERTA) 18 MG PO CR tablet Take 1 tablet (18 mg total) by mouth daily.   Polyvinyl Alcohol-Povidone (REFRESH OP) Place 1 drop into both eyes 4 (four) times daily as needed (dryness).   pravastatin (PRAVACHOL) 10 MG tablet TAKE 1 TABLET(10 MG) BY MOUTH DAILY   QUEtiapine (SEROQUEL) 25 MG tablet Take 1 tablet (25 mg total) by mouth at bedtime.   RABEprazole (ACIPHEX) 20 MG tablet TAKE ONE TABLET BY MOUTH ONE TIME DAILY   tadalafil (CIALIS) 20 MG tablet Take 20 mg by mouth daily as needed for erectile dysfunction.   traZODone (DESYREL) 50 MG tablet TAKE 1 TABLET(50 MG) BY MOUTH AT BEDTIME   No facility-administered encounter medications on file as of 01/07/2023.    Allergies (verified) Penicillins and Sulfa antibiotics   History: Past Medical History:  Diagnosis Date   Anemia    early 20's   Bell's palsy 1977   Blood clot in vein 2005   left leg "blood clot"-treated as OP; no residual   Blood transfusion without reported diagnosis    with appendectomy   Borderline diabetic    no meds  Cancer (HCC) 08/2013   prostate/ had surgery/ no chemo or radiation   Cataract    forming   CKD (chronic kidney disease) stage 3, GFR 30-59 ml/min (HCC) saw dr webb 6 months ago   on rampiril for kidney function also   Colonic polyp    X3 ,hyperplastic   Diverticulosis of colon    GERD (gastroesophageal reflux disease)    H/O hiatal hernia    Hemorrhoid    Hyperlipidemia    Hypertension    past hx - no meds since 2013 with  adrenal gland surgery   Kidney stone on left side 2013   asymptomatic , incidental finding   Migraine last 6-7 yrs ago   PMH of ; resolved post adrenal adenoma resection   Nephrolithiasis 2013   kidney stone  as  incidental finding on imaging   PONV (postoperative nausea and vomiting)    Past Surgical History:  Procedure Laterality Date   adrenal venous sampling  03-2012   ADRENALECTOMY  04/14/2012   Procedure: ADRENALECTOMY;  Surgeon: Axel Filler, MD;  Location: [facility];  Service: General;  Laterality: Left;  Left Adrenal Excision   APPENDECTOMY  1985   COLONOSCOPY     last 9/12, Dr Russella Dar; due 2017   ESOPHAGEAL DILATION  2001   Dr Corinda Gubler   POLYPECTOMY      X 3   ROBOT ASSISTED LAPAROSCOPIC RADICAL PROSTATECTOMY N/A 09/07/2013   Procedure: ROBOTIC ASSISTED LAPAROSCOPIC RADICAL PROSTATECTOMY LEVEL 1;  Surgeon: Crecencio Mc, MD;  Location: [facility];  Service: Urology;  Laterality: N/A;   Family History  Problem Relation Age of Onset   Rectal cancer Mother    Colon cancer Mother 27   Heart attack Mother 46   Colon polyps Sister    Lung cancer Sister        smoker   Diabetes Sister    Breast cancer Sister    Alzheimer's disease Sister    Heart failure Brother    Kidney cancer Brother    Prostate cancer Brother 70   Heart disease Brother    Hypertension Brother    Kidney cancer Brother    Diabetes Brother    Heart attack Brother 15   Esophageal cancer Maternal Uncle    Kidney cancer Maternal Uncle    Stomach cancer Neg Hx    Social History   Socioeconomic History   Marital status: Married    Spouse name: Stanton Kidney   Number of children: 0   Years of education: Not on file   Highest education level: Some college, no degree  Occupational History   Occupation: retired  Tobacco Use   Smoking status: Never   Smokeless tobacco: Never  Vaping Use   Vaping Use: Never used  Substance and Sexual Activity   Alcohol use: Not Currently    Comment: 1 every 3 mos   Drug use: No   Sexual activity: Yes  Other Topics Concern   Not on file  Social History Narrative   Right handed        Are you currently employed ?    What is your current occupation?retired   Do you live at home alone?    Who lives with you?    What type of home do you live in: 1 story or 2 story?        Social Determinants of Health   Financial Resource Strain: Patient Declined (11/02/2022)   Overall Financial Resource Strain (CARDIA)    Difficulty of Paying  Living Expenses: Patient declined  Food Insecurity: Patient Declined (11/02/2022)   Hunger Vital Sign    Worried About Running Out of Food in the Last Year: Patient declined    Ran Out of Food in the Last Year: Patient declined  Transportation Needs: Unmet Transportation Needs (11/02/2022)   PRAPARE - Administrator, Civil Service (Medical): No    Lack of Transportation (Non-Medical): Yes  Physical Activity: Insufficiently Active (11/02/2022)   Exercise Vital Sign    Days of Exercise per Week: 4 days    Minutes of Exercise per Session: 10 min  Stress: No Stress Concern Present (11/02/2022)   [employer] of Occupational Health - Occupational Stress Questionnaire    Feeling of Stress : Only a little  Social Connections: Unknown (11/02/2022)   Social Connection and Isolation Panel [NHANES]    Frequency of Communication with Friends and Family: Three times a week    Frequency of Social Gatherings with Friends and Family: Once a week    Attends Religious Services: Patient declined    Database administrator or Organizations: Not on file    Attends Engineer, structural: Not on file    Marital Status: Married    Tobacco Counseling Counseling given: Not Answered   Clinical Intake:  Pre-visit preparation completed: Yes  Pain : No/denies pain     BMI - recorded: 27.67 Nutritional Status: BMI 25 -29 Overweight Nutritional Risks: Unintentional weight gain Diabetes: No  How often do you need to have someone help you when you read instructions, pamphlets, or other written materials from your doctor or pharmacy?: (P) 2 - Rarely What is the last grade level you completed in school?: 2 years of college  Interpreter Needed?:  No  Information entered by :: Manal Kreutzer,CMA   Activities of Daily Living    01/03/2023    2:24 PM  In your present state of health, do you have any difficulty performing the following activities:  Hearing? 0  Vision? 1  Difficulty concentrating or making decisions? 0  Walking or climbing stairs? 0  Dressing or bathing? 0  Doing errands, shopping? 0  Preparing Food and eating ? N  Using the Toilet? N  In the past six months, have you accidently leaked urine? Y  Comment due to prostate surgery  Do you have problems with loss of bowel control? N  Managing your Medications? N  Managing your Finances? N  Housekeeping or managing your Housekeeping? N    Patient Care Team: Etta Grandchild, MD as PCP - General (Internal Medicine) Heloise Purpura, MD as Consulting Physician (Urology) Kathyrn Sheriff, [facility] (Inactive) as Pharmacist (Pharmacist) Janalyn Harder, MD (Inactive) as Consulting Physician (Dermatology) Sinda Du, MD as Consulting Physician (Ophthalmology) Antony Madura, MD as Consulting Physician (Neurology)  Indicate any recent Medical Services you may have received from other than [facility] providers in the past year (date may be approximate).     Assessment:   This is a routine wellness examination for Marcus Crawford.  Hearing/Vision screen Hearing Screening - Comments:: No concerns  Dietary issues and exercise activities discussed:     Goals Addressed             This Visit's Progress    My goal is to feel better due to low energy/fatigue.   On track    Patient Stated   On track    Maintain my current health status by continuing to exercise, eat healthy, and enjoy life.  Depression Screen    01/07/2023   10:51 AM 11/06/2022    1:04 PM 08/08/2022    9:44 AM 05/09/2022    1:57 PM 04/03/2022    2:39 PM 03/01/2022   11:07 AM 01/29/2022    8:02 AM  PHQ 2/9 Scores  PHQ - 2 Score 0 0 0 4 0 3 0  PHQ- 9 Score 5 4 4 11  8 3     Fall Risk    01/03/2023    2:24  PM 04/24/2022   10:50 AM 04/16/2022    2:51 PM 03/01/2022   11:07 AM 01/29/2022    8:04 AM  Fall Risk   Falls in the past year? 0 0 0 0 0  Number falls in past yr: 0 0 0 0 0  Injury with Fall? 0 0 0 0 0  Risk for fall due to :    No Fall Risks   Follow up Falls prevention discussed;Education provided  Falls evaluation completed Falls evaluation completed     MEDICARE RISK AT HOME:   TIMED UP AND GO:  Was the test performed?  No    Cognitive Function:        01/07/2023   10:49 AM 01/04/2022    3:27 PM  6CIT Screen  What Year? 0 points 0 points  What month? 0 points 0 points  What time? 0 points 0 points  Count back from 20 0 points 0 points  Months in reverse 0 points 0 points  Repeat phrase 0 points 0 points  Total Score 0 points 0 points    Immunizations Immunization History  Administered Date(s) Administered   Fluad Quad(high Dose 65+) 03/17/2019, 04/12/2020, 04/07/2021, 06/07/2022   Influenza Split 04/30/2011, 05/20/2012   Influenza Whole 04/07/2009, 03/31/2010   Influenza, High Dose Seasonal PF 04/17/2016, 04/16/2017, 04/21/2018   Influenza,inj,Quad PF,6+ Mos 04/08/2013, 04/13/2014, 04/21/2015   PFIZER Comirnaty(Gray Top)Covid-19 Tri-Sucrose Vaccine 11/29/2020   PFIZER(Purple Top)SARS-COV-2 Vaccination 08/15/2019, 09/07/2019, 05/17/2020   Pfizer Covid-19 Vaccine Bivalent Booster 79yrs & up 04/28/2021   Pneumococcal Conjugate-13 01/12/2016   Pneumococcal Polysaccharide-23 09/08/2013, 10/27/2019   Tdap 09/13/2011   Zoster, Live 10/05/2008    TDAP status: Up to date  Flu Vaccine status: Up to date  Pneumococcal vaccine status: Up to date  Covid-19 vaccine status: Completed vaccines  Qualifies for Shingles Vaccine? Yes   Zostavax completed Yes   Shingrix Completed?: No.    Education has been provided regarding the importance of this vaccine. Patient has been advised to call insurance company to determine out of pocket expense if they have not yet received  this vaccine. Advised may also receive vaccine at local pharmacy or Health Dept. Verbalized acceptance and understanding.  Screening Tests Health Maintenance  Topic Date Due   Zoster Vaccines- Shingrix (1 of 2) Never done   DTaP/Tdap/Td (2 - Td or Tdap) 09/12/2021   COVID-19 Vaccine (6 - 2023-24 season) 03/02/2022   INFLUENZA VACCINE  01/31/2023   Medicare Annual Wellness (AWV)  01/07/2024   Colonoscopy  09/12/2026   Pneumonia Vaccine 28+ Years old  Completed   Hepatitis C Screening  Completed   HPV VACCINES  Aged Out    Health Maintenance  Health Maintenance Due  Topic Date Due   Zoster Vaccines- Shingrix (1 of 2) Never done   DTaP/Tdap/Td (2 - Td or Tdap) 09/12/2021   COVID-19 Vaccine (6 - 2023-24 season) 03/02/2022    Colorectal cancer screening: Type of screening: Colonoscopy. Completed 09/11/21. Repeat every 5 years  Lung Cancer Screening: (Low Dose CT Chest recommended if Age 44-80 years, 20 pack-year currently smoking OR have quit w/in 15years.) does not qualify.   Lung Cancer Screening Referral: n/a  Additional Screening:  Hepatitis C Screening: does qualify; Completed 06/04/16  Vision Screening: Recommended annual ophthalmology exams for early detection of glaucoma and other disorders of the eye. Is the patient up to date with their annual eye exam?  Yes  Who is the provider or what is the name of the office in which the patient attends annual eye exams? Dec 2023, [facility] If pt is not established with a provider, would they like to be referred to a provider to establish care? No .   Dental Screening: Recommended annual dental exams for proper oral hygiene   Community Resource Referral / Chronic Care Management: CRR required this visit?  No   CCM required this visit?  No     Plan:     I have personally reviewed and noted the following in the patient's chart:   Medical and social history Use of alcohol, tobacco or illicit drugs  Current  medications and supplements including opioid prescriptions. Patient is not currently taking opioid prescriptions. Functional ability and status Nutritional status Physical activity Advanced directives List of other physicians Hospitalizations, surgeries, and ER visits in previous 12 months Vitals Screenings to include cognitive, depression, and falls Referrals and appointments  In addition, I have reviewed and discussed with patient certain preventive protocols, quality metrics, and best practice recommendations. A written personalized care plan for preventive services as well as general preventive health recommendations were provided to patient.     Malaysha Arlen  Arna Medici, CMA   01/07/2023   After Visit Summary: (MyChart) Due to this being a telephonic visit, the after visit summary with patients personalized plan was offered to patient via MyChart   Nurse Notes: none

## 2023-01-07 NOTE — Patient Instructions (Signed)
Mr. Marcus Crawford , Thank you for taking time to come for your Medicare Wellness Visit. I appreciate your ongoing commitment to your health goals. Please review the following plan we discussed and let me know if I can assist you in the future.   These are the goals we discussed:  Goals      My goal is to feel better due to low energy/fatigue.     Patient Stated     Maintain my current health status by continuing to exercise, eat healthy, and enjoy life.      Pharmacy Care Plan     CARE PLAN ENTRY (see longitudinal plan of care for additional care plan information)  Current Barriers:  Chronic Disease Management support, education, and care coordination needs related to Hypertension, Hyperlipidemia, and GERD   Hypertension BP Readings from Last 3 Encounters:  01/26/20 126/80  10/27/19 126/80  07/23/19 138/88  Pharmacist Clinical Goal(s): Over the next 180 days, patient will work with PharmD and providers to maintain BP goal <130/80 Current regimen:  No medications Interventions: Discussed BP goals and benefits of exercise and diet for prevention of heart attack / stroke Patient self care activities - Over the next 180 days, patient will: Check BP as needed, document, and provide at future appointments Ensure daily salt intake < 2300 mg/day  Hyperlipidemia Lab Results  Component Value Date/Time   LDLCALC 115 (H) 01/26/2020 08:48 AM   LDLDIRECT 58.0 01/19/2019 09:12 AM  Pharmacist Clinical Goal(s): Over the next 180 days, patient will work with PharmD and providers to achieve LDL goal < 100 Current regimen:  Pravastatin 10 mg daily Interventions: Discussed cholesterol goals and benefits of medication for prevention of heart attack / stroke Patient self care activities - Over the next 180 days, patient will: Continue medication as prescribed Continue daily exercise routine  GERD Pharmacist Clinical Goal(s) Over the next 180 days, patient will work with PharmD and providers to  optimize therapy Current regimen:  Rabeprazole 20 mg every other day Interventions: Discussed long term issues with PPI and potential to use medication as needed to limit exposure Patient self care activities - Over the next 180 days, patient will: Consider using rabeprazole on as-needed basis only  Medication management Pharmacist Clinical Goal(s): Over the next 180 days, patient will work with PharmD and providers to maintain optimal medication adherence Current pharmacy: [facility] Interventions Comprehensive medication review performed. Continue current medication management strategy Patient self care activities - Over the next 180 days, patient will: Focus on medication adherence by pill box Take medications as prescribed Report any questions or concerns to PharmD and/or provider(s)  Initial goal documentation        This is a list of the screening recommended for you and due dates:  Health Maintenance  Topic Date Due   Zoster (Shingles) Vaccine (1 of 2) Never done   DTaP/Tdap/Td vaccine (2 - Td or Tdap) 09/12/2021   COVID-19 Vaccine (6 - 2023-24 season) 03/02/2022   Flu Shot  01/31/2023   Medicare Annual Wellness Visit  01/07/2024   Colon Cancer Screening  09/12/2026   Pneumonia Vaccine  Completed   Hepatitis C Screening  Completed   HPV Vaccine  Aged Out     Health Maintenance After Age 74 After age 48, you are at a higher risk for certain long-term diseases and infections as well as injuries from falls. Falls are a major cause of broken bones and head injuries in people who are older than age 47. Getting regular preventive  care can help to keep you healthy and well. Preventive care includes getting regular testing and making lifestyle changes as recommended by your health care provider. Talk with your health care provider about: Which screenings and tests you should have. A screening is a test that checks for a disease when you have no symptoms. A diet and exercise  plan that is right for you. What should I know about screenings and tests to prevent falls? Screening and testing are the best ways to find a health problem early. Early diagnosis and treatment give you the best chance of managing medical conditions that are common after age 28. Certain conditions and lifestyle choices may make you more likely to have a fall. Your health care provider may recommend: Regular vision checks. Poor vision and conditions such as cataracts can make you more likely to have a fall. If you wear glasses, make sure to get your prescription updated if your vision changes. Medicine review. Work with your health care provider to regularly review all of the medicines you are taking, including over-the-counter medicines. Ask your health care provider about any side effects that may make you more likely to have a fall. Tell your health care provider if any medicines that you take make you feel dizzy or sleepy. Strength and balance checks. Your health care provider may recommend certain tests to check your strength and balance while standing, walking, or changing positions. Foot health exam. Foot pain and numbness, as well as not wearing proper footwear, can make you more likely to have a fall. Screenings, including: Osteoporosis screening. Osteoporosis is a condition that causes the bones to get weaker and break more easily. Blood pressure screening. Blood pressure changes and medicines to control blood pressure can make you feel dizzy. Depression screening. You may be more likely to have a fall if you have a fear of falling, feel depressed, or feel unable to do activities that you used to do. Alcohol use screening. Using too much alcohol can affect your balance and may make you more likely to have a fall. Follow these instructions at home: Lifestyle Do not drink alcohol if: Your health care provider tells you not to drink. If you drink alcohol: Limit how much you have to: 0-1 drink  a day for women. 0-2 drinks a day for men. Know how much alcohol is in your drink. In the [place] one drink equals one 12 oz bottle of beer (355 mL), one 5 oz glass of wine (148 mL), or one 1 oz glass of hard liquor (44 mL). Do not use any products that contain nicotine or tobacco. These products include cigarettes, chewing tobacco, and vaping devices, such as e-cigarettes. If you need help quitting, ask your health care provider. Activity  Follow a regular exercise program to stay fit. This will help you maintain your balance. Ask your health care provider what types of exercise are appropriate for you. If you need a cane or walker, use it as recommended by your health care provider. Wear supportive shoes that have nonskid soles. Safety  Remove any tripping hazards, such as rugs, cords, and clutter. Install safety equipment such as grab bars in bathrooms and safety rails on stairs. Keep rooms and walkways well-lit. General instructions Talk with your health care provider about your risks for falling. Tell your health care provider if: You fall. Be sure to tell your health care provider about all falls, even ones that seem minor. You feel dizzy, tiredness (fatigue), or off-balance. Take over-the-counter  and prescription medicines only as told by your health care provider. These include supplements. Eat a healthy diet and maintain a healthy weight. A healthy diet includes low-fat dairy products, low-fat (lean) meats, and fiber from whole grains, beans, and lots of fruits and vegetables. Stay current with your vaccines. Schedule regular health, dental, and eye exams. Summary Having a healthy lifestyle and getting preventive care can help to protect your health and wellness after age 46. Screening and testing are the best way to find a health problem early and help you avoid having a fall. Early diagnosis and treatment give you the best chance for managing medical conditions that are more common  for people who are older than age 50. Falls are a major cause of broken bones and head injuries in people who are older than age 67. Take precautions to prevent a fall at home. Work with your health care provider to learn what changes you can make to improve your health and wellness and to prevent falls. This information is not intended to replace advice given to you by your health care provider. Make sure you discuss any questions you have with your health care provider. Document Revised: 11/07/2020 Document Reviewed: 11/07/2020 Elsevier Patient Education  2024 [employer].

## 2023-01-08 ENCOUNTER — Ambulatory Visit: Payer: Medicare Other | Admitting: Internal Medicine

## 2023-01-13 NOTE — Progress Notes (Addendum)
HPI Marcus Crawford never smoker followed for OSA, Insomnia, Somnolence,  complicated by HTN, RBBB, Allergic Rhinitis, GERD, hx Prostate Cancer, CKD3. Kidney Stones, Depression,Tremor,  Hyperlipidemia,  HST 09/10/22- AHI 9.1/ hr, desat to 88%, body weight 202 lbs  ===================================================   09/03/22- 72 yoM never smoker for sleep evaluation courtesy of [name](Neurology) with concern of Insomnia/ fatigue Medical problem list includes HTN, RBBB, Allergic Rhinitis, GERD, hx Prostate Cancer, CKD3. Kidney Stones, Depression, Hyperlipidemia,  -Concerta 18 mgCR, Seroquel, Trazodone 50,  Epworth score 4 Body weight today-202 lbs Covid vax-5 Phizer Flu vax-had Neurology for fatigue, weakness, weight loss Complains of "low energy" in the morning, until early afternoon. Not clearly sleepy. Not sure if it reflects carry-over from sedating meds at night. With seroquel and trazodone he is sleeping through the night with one bathroom trip. No naps. Avoids caffeine thinking it might worsen depression. Denies ENT surgery, heart or lung disease. Aware of snoring.  Brother died- had hx OSA. Wife has metastatic cancer. CXR 05/02/22- : No active cardiopulmonary disease.  01/15/23- 72 yoM never smoker followed for OSA,Insomnia,  complicated by HTN, RBBB, Allergic Rhinitis, GERD, hx Prostate Cancer, CKD3. Kidney Stones, Depression,Tremor,  Hyperlipidemia,  -Concerta 18 mgCR, Seroquel, Trazodone 50,  HST 09/10/22- AHI 9.1/ hr, desat to 88%, body weight 202 lbs Body weight today-206 lbs CPAP auto 5-15/ [facility]    ordered 09/26/22 Download compliance-100%, AHI 3/ hr Download reviewed. He says he is doing fine, sleeping well. Benefits from CPAP. On and off Concerta per [name]. 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  ROS-see HPI   + = positive Constitutional:    +weight loss, night sweats, fevers, chills, fatigue, lassitude. HEENT:     headaches, difficulty swallowing, tooth/dental problems, sore throat,       sneezing, itching, ear ache, nasal congestion, post nasal drip, snoring CV:    chest pain, orthopnea, PND, swelling in lower extremities, anasarca,                                   dizziness, palpitations Resp:   shortness of breath with exertion or at rest.                productive cough,   non-productive cough, coughing up of blood.              change in color of mucus.  wheezing.   Skin:    rash or lesions. GI:  No-   heartburn, indigestion, abdominal pain, nausea, vomiting, diarrhea,                 change in bowel habits, loss of appetite GU: dysuria, change in color of urine, no urgency or frequency.   flank pain. MS:   joint pain, stiffness, decreased range of motion, back pain. Neuro-     nothing unusual Psych:  change in mood or affect. + depression or +anxiety.   memory loss.  OBJ- Physical Exam General- Alert, Oriented, Affect-appropriate, Distress- none acute, not obese Skin- rash-none, lesions- none, excoriation- none Lymphadenopathy- none Head- atraumatic            Eyes- Gross vision intact, PERRLA, conjunctivae and secretions clear            Ears- Hearing, canals-normal            Nose- Clear, no-Septal dev, mucus, polyps, erosion, perforation  Throat- Mallampati III , mucosa clear , drainage- none, tonsils- atrophic, + teeth Neck- flexible , trachea midline, no stridor , thyroid nl, carotid no bruit Chest - symmetrical excursion , unlabored           Heart/CV- RRR , no murmur , no gallop  , no rub, nl s1 s2                           - JVD+full , edema- none, stasis changes- none, varices- none           Lung- clear to P&A, wheeze- none, cough- none , dullness-none, rub- none           Chest wall-  Abd-  Br/ Gen/ Rectal- Not done, not indicated Extrem- cyanosis- none, clubbing, none, atrophy- none, strength- nl Neuro- grossly intact to observation

## 2023-01-15 ENCOUNTER — Ambulatory Visit: Payer: Medicare Other | Admitting: Internal Medicine

## 2023-01-15 ENCOUNTER — Encounter: Payer: Self-pay | Admitting: Internal Medicine

## 2023-01-15 VITALS — BP 122/60 | HR 65 | Temp 98.0°F | Ht 73.0 in | Wt 206.4 lb

## 2023-01-15 DIAGNOSIS — G4733 Obstructive sleep apnea (adult) (pediatric): Secondary | ICD-10-CM

## 2023-01-15 NOTE — Patient Instructions (Signed)
We can continue CPAP auto 5-15  You can discuss Concerta with [name] since he has prescribed it.

## 2023-01-24 ENCOUNTER — Other Ambulatory Visit: Payer: Self-pay | Admitting: Internal Medicine

## 2023-01-24 DIAGNOSIS — F5104 Psychophysiologic insomnia: Secondary | ICD-10-CM

## 2023-01-24 DIAGNOSIS — F322 Major depressive disorder, single episode, severe without psychotic features: Secondary | ICD-10-CM

## 2023-02-06 ENCOUNTER — Ambulatory Visit: Payer: Medicare Other | Admitting: Internal Medicine

## 2023-02-06 ENCOUNTER — Encounter: Payer: Self-pay | Admitting: Internal Medicine

## 2023-02-06 VITALS — BP 134/86 | HR 71 | Temp 97.9°F | Ht 73.0 in | Wt 207.0 lb

## 2023-02-06 DIAGNOSIS — G8929 Other chronic pain: Secondary | ICD-10-CM | POA: Diagnosis not present

## 2023-02-06 DIAGNOSIS — N1831 Chronic kidney disease, stage 3a: Secondary | ICD-10-CM

## 2023-02-06 DIAGNOSIS — R4189 Other symptoms and signs involving cognitive functions and awareness: Secondary | ICD-10-CM | POA: Diagnosis not present

## 2023-02-06 DIAGNOSIS — R453 Demoralization and apathy: Secondary | ICD-10-CM

## 2023-02-06 DIAGNOSIS — F322 Major depressive disorder, single episode, severe without psychotic features: Secondary | ICD-10-CM | POA: Diagnosis not present

## 2023-02-06 DIAGNOSIS — E785 Hyperlipidemia, unspecified: Secondary | ICD-10-CM | POA: Diagnosis not present

## 2023-02-06 DIAGNOSIS — I10 Essential (primary) hypertension: Secondary | ICD-10-CM

## 2023-02-06 DIAGNOSIS — C61 Malignant neoplasm of prostate: Secondary | ICD-10-CM | POA: Diagnosis not present

## 2023-02-06 DIAGNOSIS — M545 Low back pain, unspecified: Secondary | ICD-10-CM | POA: Diagnosis not present

## 2023-02-06 LAB — CBC WITH DIFFERENTIAL/PLATELET
Basophils Absolute: 0 10*3/uL (ref 0.0–0.1)
Basophils Relative: 0.7 % (ref 0.0–3.0)
Eosinophils Absolute: 0.1 10*3/uL (ref 0.0–0.7)
Eosinophils Relative: 2.6 % (ref 0.0–5.0)
HCT: 43.2 % (ref 39.0–52.0)
Hemoglobin: 13.9 g/dL (ref 13.0–17.0)
Lymphocytes Relative: 19.5 % (ref 12.0–46.0)
Lymphs Abs: 1 10*3/uL (ref 0.7–4.0)
MCHC: 32.2 g/dL (ref 30.0–36.0)
MCV: 91.6 fl (ref 78.0–100.0)
Monocytes Absolute: 0.4 10*3/uL (ref 0.1–1.0)
Monocytes Relative: 8.2 % (ref 3.0–12.0)
Neutro Abs: 3.6 10*3/uL (ref 1.4–7.7)
Neutrophils Relative %: 69 % (ref 43.0–77.0)
Platelets: 167 10*3/uL (ref 150.0–400.0)
RBC: 4.71 Mil/uL (ref 4.22–5.81)
RDW: 13.3 % (ref 11.5–15.5)
WBC: 5.2 10*3/uL (ref 4.0–10.5)

## 2023-02-06 LAB — HEPATIC FUNCTION PANEL
ALT: 17 U/L (ref 0–53)
AST: 8 U/L (ref 0–37)
Albumin: 4.3 g/dL (ref 3.5–5.2)
Alkaline Phosphatase: 72 U/L (ref 39–117)
Bilirubin, Direct: 0.1 mg/dL (ref 0.0–0.3)
Total Bilirubin: 0.4 mg/dL (ref 0.2–1.2)
Total Protein: 6.2 g/dL (ref 6.0–8.3)

## 2023-02-06 LAB — BASIC METABOLIC PANEL
BUN: 24 mg/dL — ABNORMAL HIGH (ref 6–23)
CO2: 30 mEq/L (ref 19–32)
Calcium: 9.4 mg/dL (ref 8.4–10.5)
Chloride: 104 mEq/L (ref 96–112)
Creatinine, Ser: 1.49 mg/dL (ref 0.40–1.50)
GFR: 45.96 mL/min — ABNORMAL LOW (ref >60.00)
Glucose, Bld: 118 mg/dL — ABNORMAL HIGH (ref 70–99)
Potassium: 4.8 mEq/L (ref 3.5–5.1)
Sodium: 139 mEq/L (ref 135–145)

## 2023-02-06 LAB — PSA: PSA: 0 ng/mL — ABNORMAL LOW (ref 0.10–4.00)

## 2023-02-06 LAB — CORTISOL: Cortisol, Plasma: 9.1 ug/dL

## 2023-02-06 MED ORDER — METHYLPHENIDATE HCL ER (OSM) 18 MG PO TBCR
18.0000 mg | EXTENDED_RELEASE_TABLET | Freq: Every day | ORAL | 0 refills | Status: DC
Start: 2023-02-06 — End: 2023-03-05

## 2023-02-06 NOTE — Patient Instructions (Signed)
Amphetamine; Dextroamphetamine Extended-Release Capsules What is this medication? AMPHETAMINE; DEXTROAMPHETAMINE (am FET a meen; dex troe am FET a meen) treats attention-deficit hyperactivity disorder (ADHD). It works by improving focus and reducing impulsive behavior. It belongs to a group of medications called stimulants. This medicine may be used for other purposes; ask your health care provider or pharmacist if you have questions. COMMON BRAND NAME(S): Adderall XR, Mydayis What should I tell my care team before I take this medication? They need to know if you have any of these conditions: Anxiety or panic attacks Circulation problems in fingers or toes (Raynaud syndrome) Glaucoma Heart attack Heart disease High blood pressure Kidney disease Liver disease Mental health conditions Seizures Stroke Substance use disorder Suicidal thoughts, plans, or attempt by you or a family member Thyroid disease Tourette syndrome An unusual or allergic reaction to dextroamphetamine, other medications, foods, dyes, or preservatives Pregnant or trying to get pregnant Breastfeeding How should I use this medication? Take this medication by mouth with water. Take it as directed on the prescription label at the same time every day. You can take it with or without food. If it upsets your stomach, take it with food. Do not cut, crush, or chew this medication. Swallow the capsules whole. You may open the capsule and put the contents in 1 teaspoon of applesauce. Swallow the medication and applesauce right away. Do not chew the medication or applesauce. Keep taking it unless your care team tells you to stop. A special MedGuide will be given to you by the pharmacist with each prescription and refill. Be sure to read this information carefully each time. Talk to your care team about the use of this medication in children. While it may be prescribed for children as young as 6 years for selected conditions,  precautions do apply. Overdosage: If you think you have taken too much of this medicine contact a poison control center or emergency room at once. NOTE: This medicine is only for you. Do not share this medicine with others. What if I miss a dose? If you miss a dose, take it as soon as you can in the morning, but do not take it later in the day because it can cause trouble sleeping. If it is almost time for your next dose, take only that dose. Do not take double or extra doses. What may interact with this medication? Do not take this medication with any of the following: Linezolid MAOIs, such as Marplan, Nardil, and Parnate Methylene blue This medication may also interact with the following: Acetazolamide Alcohol Ascorbic acid Certain medications for depression, anxiety, or other mental health conditions Certain medications for migraines, such as sumatriptan Guanethidine Opioids Reserpine Sodium bicarbonate St. John's wort Thiazide diuretics, such as chlorothiazide Tryptophan This list may not describe all possible interactions. Give your health care provider a list of all the medicines, herbs, non-prescription drugs, or dietary supplements you use. Also tell them if you smoke, drink alcohol, or use illegal drugs. Some items may interact with your medicine. What should I watch for while using this medication? Visit your care team for regular checks on your progress. Tell your care team if your symptoms do not start to get better or if they get worse. This medication requires a new prescription from your care team every time it is filled at the pharmacy. This medication can be abused and cause your brain and body to depend on it after high doses or long term use. Your care team will assess your  risk and monitor you closely during treatment. Long term use of this medication may cause your brain and body to depend on it. You may be able to take breaks from this medication during weekends,  holidays, or summer vacations. Talk to your care team about what works for you. If your care team wants you to stop this medication permanently, the dose may be slowly lowered over time to reduce the risk of side effects. Tell your care team if this medication loses its effects, or if you feel you need to take more than the prescribed amount. Do not change your dose without talking to your care team. Do not drink alcohol while taking this medication. Drinking alcohol may alter the effects of some brands of this medication. Serious side effects may occur. Do not take this medication close to bedtime. It may prevent you from sleeping. Loss of appetite is common when starting this medication. Eating small, frequent meals or snacks can help. Talk to your care team if appetite loss persists. Children should have height and weight checked often while taking this medication. Tell your care team right away if you notice unexplained wounds on your fingers and toes while taking this medication. You should also tell your care team if you experience numbness or pain, changes in the skin color, or sensitivity to temperature in your fingers or toes. Contact your care team right away if you have an erection that lasts longer than 4 hours or if it becomes painful. This may be a sign of a serious problem and must be treated right away to prevent permanent damage. What side effects may I notice from receiving this medication? Side effects that you should report to your care team as soon as possible: Allergic reactions--skin rash, itching, hives, swelling of the face, lips, tongue, or throat Heart attack--pain or tightness in the chest, shoulders, arms, or jaw, nausea, shortness of breath, cold or clammy skin, feeling faint or lightheaded Heart rhythm changes--fast or irregular heartbeat, dizziness, feeling faint or lightheaded, chest pain, trouble breathing Increase in blood pressure Irritability, confusion, fast or  irregular heartbeat, muscle stiffness, twitching muscles, sweating, high fever, seizure, chills, vomiting, diarrhea, which may be signs of serotonin syndrome Mood and behavior changes--anxiety, nervousness, confusion, hallucinations, irritability, hostility, thoughts of suicide or self-harm, worsening mood, feelings of depression Prolonged or painful erection Raynaud syndrome--cool, numb, or painful fingers or toes that may change color from pale, to blue, to red Seizures Stroke--sudden numbness or weakness of the face, arm, or leg, trouble speaking, confusion, trouble walking, loss of balance or coordination, dizziness, severe headache, change in vision Side effects that usually do not require medical attention (report these to your care team if they continue or are bothersome): Dry mouth Headache Loss of appetite with weight loss Nausea Stomach pain Trouble sleeping This list may not describe all possible side effects. Call your doctor for medical advice about side effects. You may report side effects to FDA at 1-800-FDA-1088. Where should I keep my medication? Keep out of the reach of children and pets. This medication can be abused. Keep it in a safe place to protect it from theft. Do not share it with anyone. It is only for you. Selling or giving away this medication is dangerous and against the law. Store at room temperature between 15 and 30 degrees C (59 and 86 degrees F). Protect from light and moisture. Keep container tightly closed. Get rid of any unused medication after the expiration date. This medication may  cause harm and death if it is taken by other adults, children, or pets. It is important to get rid of the medication as soon as you no longer need it or it is expired. You can do this in two ways: Take the medication to a medication take-back program. Check with your pharmacy or law enforcement to find a location. If you cannot return the medication, check the label or package  insert to see if the medication should be thrown out in the garbage or flushed down the toilet. If you are not sure, ask your care team. If it is safe to put it in the trash, take the medication out of the container. Mix the medication with cat litter, dirt, coffee grounds, or other unwanted substance. Seal the mixture in a bag or container. Put it in the trash. NOTE: This sheet is a summary. It may not cover all possible information. If you have questions about this medicine, talk to your doctor, pharmacist, or health care provider.  2024 Elsevier/Gold Standard (2022-05-16 00:00:00)

## 2023-02-06 NOTE — Progress Notes (Signed)
Subjective:  Patient ID: Marcus Crawford, male    DOB: 1951-06-13  Age: 72 y.o. MRN: [id number]  CC: Hypertension, Hyperlipidemia, Gastroesophageal Reflux, Depression, and Back Pain   HPI Marcus Crawford presents for f/up ----   Discussed the use of AI scribe software for clinical note transcription with the patient, who gave verbal consent to proceed.  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 They report no chest pain, shortness of breath, or musculoskeletal discomfort. The patient was previously on Concerta, a Schedule II narcotic, for 30 days and felt it improved their energy levels. However, they have not taken it for some time and have noticed a return of fatigue, necessitating daytime rest.  The patient has also experienced weight gain despite attempts to maintain physical activity, walking between four tenths of a mile and half a mile daily. They report initial fatigue during these walks, which improves halfway through the activity. There is no associated chest pain or shortness of breath during these episodes of exertion.  The patient has been using a CPAP machine since May and reports difficulty falling asleep but good quality sleep once asleep. They express concern that their single adrenal gland may be contributing to their symptoms and request a cortisol level check.       Outpatient Medications Prior to Visit  Medication Sig Dispense Refill   cholecalciferol (VITAMIN D) 1000 UNITS tablet Take 2,000 Units by mouth daily. Taking 2000 units daily     DULoxetine (CYMBALTA) 60 MG capsule TAKE 1 CAPSULE(60 MG) BY MOUTH DAILY 90 capsule 1   hypromellose (SYSTANE OVERNIGHT THERAPY) 0.3 % GEL ophthalmic ointment Place 1 Application into both eyes at bedtime.     levocetirizine (XYZAL) 5 MG tablet TAKE 1 TABLET(5 MG) BY MOUTH EVERY EVENING (Patient  taking differently: Take 5 mg by mouth daily as needed for allergies.) 90 tablet 1   Polyvinyl Alcohol-Povidone (REFRESH OP) Place 1 drop into both eyes 4 (four) times daily as needed (dryness).     pravastatin (PRAVACHOL) 10 MG tablet TAKE 1 TABLET(10 MG) BY MOUTH DAILY 90 tablet 0   QUEtiapine (SEROQUEL) 25 MG tablet TAKE 1 TABLET(25 MG) BY MOUTH AT BEDTIME 90 tablet 0   RABEprazole (ACIPHEX) 20 MG tablet TAKE ONE TABLET BY MOUTH ONE TIME DAILY 90 tablet 1   tadalafil (CIALIS) 20 MG tablet Take 20 mg by mouth daily as needed for erectile dysfunction.     traZODone (DESYREL) 50 MG tablet TAKE 1 TABLET(50 MG) BY MOUTH AT BEDTIME 90 tablet 0   methylphenidate (CONCERTA) 18 MG PO CR tablet Take 1 tablet (18 mg total) by mouth daily. 30 tablet 0   No facility-administered medications prior to visit.    ROS Review of Systems  Constitutional:  Positive for fatigue. Negative for diaphoresis and unexpected weight change.  HENT: Negative.    Eyes: Negative.   Respiratory:  Positive for apnea. Negative for cough, shortness of breath and wheezing.   Cardiovascular:  Negative for chest pain, palpitations and leg swelling.  Gastrointestinal:  Negative for abdominal pain, constipation, diarrhea and vomiting.  Genitourinary: Negative.  Negative for difficulty urinating.  Musculoskeletal:  Positive for back pain. Negative for arthralgias and myalgias.  Skin: Negative.   Neurological: Negative.  Negative for dizziness, tremors and weakness.  Hematological:  Negative for adenopathy. Does not bruise/bleed easily.  Psychiatric/Behavioral:  Positive for  decreased concentration and dysphoric mood. Negative for confusion. The patient is not nervous/anxious.     Objective:  BP 134/86 (BP Location: Right Arm, Patient Position: Sitting, Cuff Size: Large)   Pulse 71   Temp 97.9 F (36.6 C) (Oral)   Ht 6\' 1"  (1.854 m)   Wt 207 lb (93.9 kg)   SpO2 96%   BMI 27.31 kg/m   BP Readings from Last 3 Encounters:   02/06/23 134/86  01/15/23 122/60  11/14/22 (!) 161/82    Wt Readings from Last 3 Encounters:  02/06/23 207 lb (93.9 kg)  01/15/23 206 lb 6.4 oz (93.6 kg)  01/07/23 204 lb (92.5 kg)    Physical Exam Vitals reviewed.  Constitutional:      Appearance: Normal appearance.  HENT:     Nose: Nose normal.     Mouth/Throat:     Mouth: Mucous membranes are moist.  Eyes:     General: No scleral icterus.    Conjunctiva/sclera: Conjunctivae normal.  Cardiovascular:     Rate and Rhythm: Normal rate and regular rhythm.     Heart sounds: No murmur heard.    No friction rub. No gallop.  Pulmonary:     Effort: Pulmonary effort is normal.     Breath sounds: No stridor. No wheezing, rhonchi or rales.  Abdominal:     General: Abdomen is flat.     Palpations: There is no mass.     Tenderness: There is no abdominal tenderness. There is no guarding.     Hernia: No hernia is present.  Musculoskeletal:        General: Normal range of motion.     Cervical back: Neck supple.     Right lower leg: No edema.     Left lower leg: No edema.  Lymphadenopathy:     Cervical: No cervical adenopathy.  Skin:    General: Skin is warm and dry.  Neurological:     General: No focal deficit present.     Mental Status: He is alert. Mental status is at baseline.  Psychiatric:        Attention and Perception: He is attentive.        Mood and Affect: Mood is depressed. Mood is not anxious. Affect is flat.        Speech: Speech is delayed.        Behavior: Behavior normal.        Thought Content: Thought content normal. Thought content is not paranoid or delusional. Thought content does not include homicidal or suicidal ideation.        Cognition and Memory: Cognition normal.     Lab Results  Component Value Date   WBC 5.2 02/06/2023   HGB 13.9 02/06/2023   HCT 43.2 02/06/2023   PLT 167.0 02/06/2023   GLUCOSE 118 (H) 02/06/2023   CHOL 141 11/06/2022   TRIG 228.0 (H) 11/06/2022   HDL 38.40 (L)  11/06/2022   LDLDIRECT 79.0 11/06/2022   LDLCALC 115 (H) 01/26/2020   ALT 17 02/06/2023   AST 8 02/06/2023   NA 139 02/06/2023   K 4.8 02/06/2023   CL 104 02/06/2023   CREATININE 1.49 02/06/2023   BUN 24 (H) 02/06/2023   CO2 30 02/06/2023   TSH 2.11 11/06/2022   PSA 0.00 Repeated and verified X2. (L) 02/06/2023   INR 1.12 02/28/2012   HGBA1C 5.9 11/06/2022   MICROALBUR 0.4 10/14/2008    DG Chest 2 View  Result Date: 05/02/2022 CLINICAL DATA:  Sob.  Never smoker, HTN, Pt reports weakness AND fatigue increasingly recently. Pt also reports weight loss over the past few months. Reports he does take testosterone which doesn't make him feel good. Urology sent over EXAM: CHEST - 2 VIEW COMPARISON:  Chest x-ray 04/10/2022 FINDINGS: The heart and mediastinal contours are within normal limits. No focal consolidation. No pulmonary edema. No pleural effusion. No pneumothorax. No acute osseous abnormality. IMPRESSION: No active cardiopulmonary disease. Electronically Signed   By: Tish Frederickson M.D.   On: 05/02/2022 15:58    Assessment & Plan:   Apathy -     Cortisol; Future -     Methylphenidate HCl ER (OSM); Take 1 tablet (18 mg total) by mouth daily.  Dispense: 30 tablet; Refill: 0  Stage 3a chronic kidney disease (HCC)- Will avoid nephrotoxic agents. -     Basic metabolic panel; Future  Prostate cancer (HCC)- PSA is undetectable. -     PSA; Future  Essential hypertension- His blood pressure is well-controlled.  Labs are negative for secondary causes. -     Cortisol; Future -     Basic metabolic panel; Future -     CBC with Differential/Platelet; Future -     Hepatic function panel; Future  Current severe episode of major depressive disorder without psychotic features without prior episode (HCC) -     Methylphenidate HCl ER (OSM); Take 1 tablet (18 mg total) by mouth daily.  Dispense: 30 tablet; Refill: 0  Pseudodementia -     Methylphenidate HCl ER (OSM); Take 1 tablet (18 mg  total) by mouth daily.  Dispense: 30 tablet; Refill: 0  Hyperlipidemia with target LDL less than 100 - LDL goal achieved. Doing well on the statin  -     Hepatic function panel; Future  Chronic bilateral low back pain without sciatica -     Ambulatory referral to Physical Medicine Rehab     Follow-up: Return in about 6 months (around 08/09/2023).  Sanda Linger, MD

## 2023-02-12 ENCOUNTER — Encounter: Payer: Self-pay | Admitting: Internal Medicine

## 2023-02-12 DIAGNOSIS — G4733 Obstructive sleep apnea (adult) (pediatric): Secondary | ICD-10-CM | POA: Insufficient documentation

## 2023-02-12 NOTE — Assessment & Plan Note (Signed)
Mild OSA. Treatment options discussed Plan-continue auto 5-15/ [facility]

## 2023-02-13 ENCOUNTER — Encounter: Payer: Self-pay | Admitting: Physical Medicine and Rehabilitation

## 2023-02-13 ENCOUNTER — Other Ambulatory Visit (INDEPENDENT_AMBULATORY_CARE_PROVIDER_SITE_OTHER): Payer: Medicare Other

## 2023-02-13 ENCOUNTER — Ambulatory Visit (INDEPENDENT_AMBULATORY_CARE_PROVIDER_SITE_OTHER): Payer: Medicare Other | Admitting: Physical Medicine and Rehabilitation

## 2023-02-13 DIAGNOSIS — M546 Pain in thoracic spine: Secondary | ICD-10-CM

## 2023-02-13 DIAGNOSIS — G8929 Other chronic pain: Secondary | ICD-10-CM

## 2023-02-13 NOTE — Progress Notes (Signed)
Functional Pain Scale - descriptive words and definitions  Distracting (5)    Aware of pain/able to complete some ADL's but limited by pain/sleep is affected and active distractions are only slightly useful. Moderate range order  Average Pain 6-7  Middle back pain that goes all the way across. Hard to walk and stand for long periods of time

## 2023-02-13 NOTE — Progress Notes (Signed)
TYMOTHY Crawford - 72 y.o. male MRN [id number]  Date of birth: 1950/12/23  Office Visit Note: Visit Date: 02/13/2023 PCP: Etta Grandchild, MD Referred by: Etta Grandchild, MD  Subjective: Chief Complaint  Patient presents with   Middle Back - Pain   HPI: Marcus Crawford is a 72 y.o. male who comes in today per the request of Dr. Sanda Linger for evaluation of chronic, worsening and severe bilateral thoracic back pain. Pain ongoing for several years, his pain started suddenly while walking, worsens with movement and activity. He describes pain as sore and aching, currently rates as 7 out of 10.  Pain radiates across entire middle back. His pain improves significantly with rest and sitting. Some relief of pain with home exercise regimen, rest and use of medications. No history of physical therapy/chiropractic treatments. He was evaluated for same in 2023 by Dr. Ranee Gosselin with [facility]. Patient was told his symptoms are contributed to arthritic changes in the thoracic spine. No prior thoracic MRI imaging. No history of spine surgery/injections. Patient denies focal weakness, numbness and tingling. No recent trauma or falls.   Patients course is complicated by prostate cancer, kidney disease, depression, fatigue, generalized weakness.    Review of Systems  Musculoskeletal:  Positive for back pain.  Neurological:  Negative for tingling, sensory change, focal weakness and weakness.  All other systems reviewed and are negative.  Otherwise per HPI.  Assessment & Plan: Visit Diagnoses:    ICD-10-CM   1. Chronic bilateral thoracic back pain  M54.6 XR Thoracic Spine 2 View   G89.29 MR THORACIC SPINE WO CONTRAST       Plan: Findings:  Chronic, worsening and severe bilateral thoracic back pain. Patient continues to have severe pain despite good conservative therapies such as home exercise regimen, rest and use of medications. Patients clinical presentation and exam are complex, differentials  include facet mediated pain, thoracic radiculopathy and myofascial pain syndrome. I did obtain 2 view thoracic x-rays in the office today that exhibits normal anatomical alignment, multi level degenerative changes with anterior osteophytes. Given his symptoms have been ongoing for several years and history of prostate cancer I placed order for thoracic MRI imaging. Depending on results of MRI imaging we discussed possibility of performing thoracic injections. I also placed order for formal physical therapy, I think he would benefit from short course of PT. I will see him back for thoracic MRI review. No red flag symptoms noted upon exam today.     Meds & Orders: No orders of the defined types were placed in this encounter.   Orders Placed This Encounter  Procedures   XR Thoracic Spine 2 View   MR THORACIC SPINE WO CONTRAST    Follow-up: Return for thoracic MRI review.   Procedures: No procedures performed      Clinical History: No specialty comments available.   He reports that he has never smoked. He has never used smokeless tobacco.  Recent Labs    02/20/22 1244 11/06/22 1338  HGBA1C 6.0* 5.9    Objective:  VS:  HT:    WT:   BMI:     BP:   HR: bpm  TEMP: ( )  RESP:  Physical Exam Vitals and nursing note reviewed.  HENT:     Head: Normocephalic and atraumatic.     Right Ear: External ear normal.     Left Ear: External ear normal.     Nose: Nose normal.     Mouth/Throat:  Mouth: Mucous membranes are moist.  Eyes:     Extraocular Movements: Extraocular movements intact.  Cardiovascular:     Rate and Rhythm: Normal rate.     Pulses: Normal pulses.  Pulmonary:     Effort: Pulmonary effort is normal.  Abdominal:     General: Abdomen is flat. There is no distension.  Musculoskeletal:        General: Tenderness present.     Cervical back: Normal range of motion.     Comments: Normal thoracic kyphosis noted. No tenderness noted upon palpation of spinous processes.  No step off deformity. No pain noted with flexion, extension, lateral bending and rotation. No palpable trigger points. Good strength noted to bilateral upper extremities.    Skin:    General: Skin is warm and dry.     Capillary Refill: Capillary refill takes less than 2 seconds.  Neurological:     General: No focal deficit present.     Mental Status: He is alert and oriented to person, place, and time.  Psychiatric:        Mood and Affect: Mood normal.        Behavior: Behavior normal.     Ortho Exam  Imaging: XR Thoracic Spine 2 View  Result Date: 02/13/2023 2 view radiographs of thoracic spine exhibit normal anatomical alignment, multi level degenerative changes with anterior osteophytes. No spondylolisthesis noted. No fractures or dislocations.    Past Medical/Family/Surgical/Social History: Medications & Allergies reviewed per EMR, new medications updated. 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   Current severe episode of major depressive disorder without psychotic features without prior episode (HCC) 04/18/2022   RBBB (right bundle branch block) 01/30/2021   Seasonal allergic rhinitis due to pollen 01/15/2018   Obesity, Class I, BMI 30-34.9 01/12/2016   Kidney disease, chronic, stage III (GFR 30-59 ml/min) (HCC) 01/12/2016   Prostate cancer (HCC) 07/30/2013   Vitamin D deficiency 06/15/2013   Essential hypertension 09/15/2007   Esophageal reflux 12/06/2006   History of colonic polyps 12/06/2006   Past Medical History:  Diagnosis Date   Anemia    early 20's   Bell's palsy 1977   Blood clot in vein 2005   left leg "blood clot"-treated as OP; no residual   Blood transfusion without reported diagnosis    with  appendectomy   Borderline diabetic    no meds   Cancer (HCC) 08/2013   prostate/ had surgery/ no chemo or radiation   Cataract    forming   CKD (chronic kidney disease) stage 3, GFR 30-59 ml/min (HCC) saw dr Hyman Hopes 6 months ago   on rampiril for kidney function also   Colonic polyp    X3 ,hyperplastic   Diverticulosis of colon    GERD (gastroesophageal reflux disease)    H/O hiatal hernia    Hemorrhoid    Hyperlipidemia    Hypertension    past hx - no meds since 2013 with  adrenal gland surgery   Kidney stone on left side 2013   asymptomatic , incidental finding   Migraine last 6-7 yrs ago   PMH of ; resolved post adrenal adenoma resection   Nephrolithiasis 2013   kidney stone  as incidental finding on imaging   PONV (postoperative nausea  and vomiting)    Family History  Problem Relation Age of Onset   Rectal cancer Mother    Colon cancer Mother 22   Heart attack Mother 58   Colon polyps Sister    Lung cancer Sister        smoker   Diabetes Sister    Breast cancer Sister    Alzheimer's disease Sister    Heart failure Brother    Kidney cancer Brother    Prostate cancer Brother 60   Heart disease Brother    Hypertension Brother    Kidney cancer Brother    Diabetes Brother    Heart attack Brother 25   Esophageal cancer Maternal Uncle    Kidney cancer Maternal Uncle    Stomach cancer Neg Hx    Past Surgical History:  Procedure Laterality Date   adrenal venous sampling  03-2012   ADRENALECTOMY  04/14/2012   Procedure: ADRENALECTOMY;  Surgeon: Axel Filler, MD;  Location: [facility];  Service: General;  Laterality: Left;  Left Adrenal Excision   APPENDECTOMY  1985   COLONOSCOPY     last 9/12, Dr Russella Dar; due 2017   ESOPHAGEAL DILATION  2001   Dr Corinda Gubler   POLYPECTOMY      X 3   ROBOT ASSISTED LAPAROSCOPIC RADICAL PROSTATECTOMY N/A 09/07/2013   Procedure: ROBOTIC ASSISTED LAPAROSCOPIC RADICAL PROSTATECTOMY LEVEL 1;  Surgeon: Crecencio Mc, MD;  Location: [facility];  Service:  Urology;  Laterality: N/A;   Social History   Occupational History   Occupation: retired  Tobacco Use   Smoking status: Never   Smokeless tobacco: Never  Vaping Use   Vaping status: Never Used  Substance and Sexual Activity   Alcohol use: Not Currently    Comment: 1 every 3 mos   Drug use: No   Sexual activity: Yes

## 2023-02-15 ENCOUNTER — Other Ambulatory Visit: Payer: Self-pay | Admitting: Internal Medicine

## 2023-02-15 DIAGNOSIS — F322 Major depressive disorder, single episode, severe without psychotic features: Secondary | ICD-10-CM

## 2023-02-15 DIAGNOSIS — F5104 Psychophysiologic insomnia: Secondary | ICD-10-CM

## 2023-02-18 ENCOUNTER — Telehealth (HOSPITAL_COMMUNITY): Payer: Medicare Other | Admitting: Psychiatry

## 2023-02-26 ENCOUNTER — Encounter: Payer: Self-pay | Admitting: Internal Medicine

## 2023-03-05 ENCOUNTER — Other Ambulatory Visit: Payer: Self-pay | Admitting: Internal Medicine

## 2023-03-05 DIAGNOSIS — F322 Major depressive disorder, single episode, severe without psychotic features: Secondary | ICD-10-CM

## 2023-03-05 DIAGNOSIS — R453 Demoralization and apathy: Secondary | ICD-10-CM

## 2023-03-05 DIAGNOSIS — R4189 Other symptoms and signs involving cognitive functions and awareness: Secondary | ICD-10-CM

## 2023-03-06 ENCOUNTER — Encounter: Payer: Self-pay | Admitting: Internal Medicine

## 2023-03-06 MED ORDER — METHYLPHENIDATE HCL ER (OSM) 18 MG PO TBCR
18.0000 mg | EXTENDED_RELEASE_TABLET | Freq: Every day | ORAL | 0 refills | Status: DC
Start: 2023-03-06 — End: 2023-04-07

## 2023-03-07 ENCOUNTER — Ambulatory Visit
Admission: RE | Admit: 2023-03-07 | Discharge: 2023-03-07 | Disposition: A | Discharge status: Home / Self Care | Payer: Medicare Other | Source: Ambulatory Visit | Attending: Physical Medicine and Rehabilitation | Admitting: Physical Medicine and Rehabilitation

## 2023-03-07 ENCOUNTER — Telehealth: Payer: Self-pay | Admitting: Internal Medicine

## 2023-03-07 DIAGNOSIS — M546 Pain in thoracic spine: Secondary | ICD-10-CM | POA: Diagnosis not present

## 2023-03-07 DIAGNOSIS — R937 Abnormal findings on diagnostic imaging of other parts of musculoskeletal system: Secondary | ICD-10-CM | POA: Diagnosis not present

## 2023-03-07 DIAGNOSIS — M47814 Spondylosis without myelopathy or radiculopathy, thoracic region: Secondary | ICD-10-CM | POA: Diagnosis not present

## 2023-03-07 DIAGNOSIS — G8929 Other chronic pain: Secondary | ICD-10-CM

## 2023-03-07 NOTE — Telephone Encounter (Signed)
Pharmacy states they do not have the OSM methylphenidate (CONCERTA), they only have the extended release.  They are requesting we either change the RX to the extended release or send the OSM methylphenidate (CONCERTA) to another pharmacy.

## 2023-03-08 ENCOUNTER — Other Ambulatory Visit: Payer: Self-pay | Admitting: Internal Medicine

## 2023-03-08 DIAGNOSIS — F322 Major depressive disorder, single episode, severe without psychotic features: Secondary | ICD-10-CM

## 2023-03-08 MED ORDER — DULOXETINE HCL 60 MG PO CPEP
60.0000 mg | ORAL_CAPSULE | Freq: Every day | ORAL | 0 refills | Status: DC
Start: 2023-03-08 — End: 2023-06-27

## 2023-03-08 NOTE — Telephone Encounter (Signed)
I have spoke to the pharmacist, she stated they do not have Concerta CR in stock but they have ER and OSM. Per PCP ok to change to ER. Verbal has been given to fill.

## 2023-03-12 DIAGNOSIS — H04121 Dry eye syndrome of right lacrimal gland: Secondary | ICD-10-CM | POA: Diagnosis not present

## 2023-03-12 DIAGNOSIS — H04122 Dry eye syndrome of left lacrimal gland: Secondary | ICD-10-CM | POA: Diagnosis not present

## 2023-03-15 ENCOUNTER — Telehealth: Payer: Self-pay | Admitting: Physical Medicine and Rehabilitation

## 2023-03-15 NOTE — Telephone Encounter (Signed)
Spoke with patient and informed him that it may take up to 2 weeks for results to come back. Verbalized understanding

## 2023-03-15 NOTE — Telephone Encounter (Signed)
Patient called and said he was sent out for MRI on his back and never heard anything. Its been a week and he wants to know the results. CB#[phone]

## 2023-03-21 ENCOUNTER — Other Ambulatory Visit: Payer: Self-pay

## 2023-03-21 ENCOUNTER — Encounter (HOSPITAL_COMMUNITY): Payer: Self-pay | Admitting: Psychiatry

## 2023-03-21 ENCOUNTER — Ambulatory Visit (HOSPITAL_BASED_OUTPATIENT_CLINIC_OR_DEPARTMENT_OTHER): Payer: Medicare Other | Admitting: Psychiatry

## 2023-03-21 VITALS — BP 147/79 | HR 76 | Ht 72.0 in | Wt 206.0 lb

## 2023-03-21 DIAGNOSIS — R5383 Other fatigue: Secondary | ICD-10-CM

## 2023-03-21 NOTE — Progress Notes (Signed)
Psychiatric Initial Adult Assessment   Patient Location: Office Provider Location:  Patient Identification: Marcus Crawford MRN:  [id number] Date of Evaluation:  03/21/2023 Referral Source: PCP Chief Complaint:   Chief Complaint  Patient presents with   Establish Care    Low energy and sleep issues   Visit Diagnosis:    ICD-10-CM   1. Fatigue, unspecified type  R53.83       History of Present Illness: Patient is a 72 year old retired, married man who is referred from his primary care for psychiatric evaluation.  Patient came in with a chief complaint of fatigue and tiredness.  Patient told he have the symptoms for more than a year ago.  He recalled he was doing the lawn more at his family house in [place] when all of sudden he feels very tired and he has to lie down for 1 hour until he gets strength back and drove back to [place].  Since then he has chronic fatigue.  He has seen his primary care doctor and neurology.  He also had few ER visit for extreme fatigue but so far he was told workup is normal.  Patient told his neurologist Dr. Loleta Chance had done workup for myasthenia, Parkinson, B12, memory check but so far did not find anything abnormal.  He had an MRI of brain which is normal.  He reported no other associated symptoms beside fatigue and insomnia.  His primary care started him on Cymbalta more than a year ago and later Seroquel and trazodone was added because of insomnia.  He was also given this January Concerta 18 mg which helped his energy but he did not get it refilled until recently back again.  He denies any depressive thoughts, denies any feeling of hopelessness, worthlessness.  He denies any hallucination, paranoia, irritability, anger, panic attack, suicidal thoughts.  He did not specify any stressors that is causing issues in his life.  He lives with his wife who recently diagnosed with bone cancer in September.  He has no children.  He is youngest of his 9 siblings.  He denies  any history of abuse, trauma.  He denies any nightmares or flashback.  He was given diagnosis of apnea and now using CPAP.  He reported his sleep is much better and gradually and slowly he feels much better as compared to last year.  He is taking trazodone, quetiapine, Cymbalta and Concerta.  Though he do not reported any depressive symptoms and does not believe he has depression but admitted he is doing fine and better as compared to last year.  He claimed lost more than 20 pounds in 3 months however as per EMR no significant weight loss or weight gain.  He has never seen psychiatrist before and no history of suicidal attempt.  He is involved in his social network and had good friends.  He enjoyed watching TV, goes to grocery store shopping.  He tried to keep himself busy and work on his truck which gave him enjoyment.  He has mild tremors however his neurologist told it could be due to psych medicine.  He denies drinking or using any illegal substances.  Patient has like to spend time with his truck.  Patient has multiple health issues including hypertension, right bundle block, chronic kidney disease, esophageal reflux, low testosterone, vitamin D deficiency and low back pain.  He is compliant with medication and doctor's appointment.  He is not seeing any therapist.  He do not recall any family history of mental disorder.  Associated Signs/Symptoms: Depression Symptoms:  fatigue, difficulty concentrating, loss of energy/fatigue, weight loss, decreased appetite, (Hypo) Manic Symptoms:   none Anxiety Symptoms:   denies any anxiety symptoms Psychotic Symptoms:   none PTSD Symptoms: Negative  Past Psychiatric History: Denies any history of suicidal attempt, mania, psychosis, delusion, PTSD, inpatient, panic attacks, depression.    Previous Psychotropic Medications: Yes   Substance Abuse History in the last 12 months:  No.  Consequences of Substance Abuse: NA  Past Medical History:  Past  Medical History:  Diagnosis Date   Anemia    early 20's   Bell's palsy 1977   Blood clot in vein 2005   left leg "blood clot"-treated as OP; no residual   Blood transfusion without reported diagnosis    with appendectomy   Borderline diabetic    no meds   Cancer (HCC) 08/2013   prostate/ had surgery/ no chemo or radiation   Cataract    forming   CKD (chronic kidney disease) stage 3, GFR 30-59 ml/min (HCC) saw dr Hyman Hopes 6 months ago   on rampiril for kidney function also   Colonic polyp    X3 ,hyperplastic   Diverticulosis of colon    GERD (gastroesophageal reflux disease)    H/O hiatal hernia    Hemorrhoid    Hyperlipidemia    Hypertension    past hx - no meds since 2013 with  adrenal gland surgery   Kidney stone on left side 2013   asymptomatic , incidental finding   Migraine last 6-7 yrs ago   PMH of ; resolved post adrenal adenoma resection   Nephrolithiasis 2013   kidney stone  as incidental finding on imaging   PONV (postoperative nausea and vomiting)     Past Surgical History:  Procedure Laterality Date   adrenal venous sampling  03-2012   ADRENALECTOMY  04/14/2012   Procedure: ADRENALECTOMY;  Surgeon: Axel Filler, MD;  Location: [facility];  Service: General;  Laterality: Left;  Left Adrenal Excision   APPENDECTOMY  1985   COLONOSCOPY     last 9/12, Dr Russella Dar; due 2017   ESOPHAGEAL DILATION  2001   Dr Corinda Gubler   POLYPECTOMY      X 3   ROBOT ASSISTED LAPAROSCOPIC RADICAL PROSTATECTOMY N/A 09/07/2013   Procedure: ROBOTIC ASSISTED LAPAROSCOPIC RADICAL PROSTATECTOMY LEVEL 1;  Surgeon: Crecencio Mc, MD;  Location: [facility];  Service: Urology;  Laterality: N/A;    Family Psychiatric History: Denies family history of psychiatric illness.  Family History:  Family History  Problem Relation Age of Onset   Rectal cancer Mother    Colon cancer Mother 4   Heart attack Mother 26   Colon polyps Sister    Lung cancer Sister        smoker   Diabetes Sister    Breast cancer  Sister    Alzheimer's disease Sister    Heart failure Brother    Kidney cancer Brother    Prostate cancer Brother 23   Heart disease Brother    Hypertension Brother    Kidney cancer Brother    Diabetes Brother    Heart attack Brother 77   Esophageal cancer Maternal Uncle    Kidney cancer Maternal Uncle    Stomach cancer Neg Hx     Social History:   Social History   Socioeconomic History   Marital status: Married    Spouse name: Stanton Kidney   Number of children: 0   Years of education: Not on file  Highest education level: Some college, no degree  Occupational History   Occupation: retired  Tobacco Use   Smoking status: Never   Smokeless tobacco: Never  Vaping Use   Vaping status: Never Used  Substance and Sexual Activity   Alcohol use: Not Currently   Drug use: No   Sexual activity: Yes  Other Topics Concern   Not on file  Social History Narrative   Right handed        Are you currently employed ?    What is your current occupation?retired   Do you live at home alone?   Who lives with you?    What type of home do you live in: 1 story or 2 story?        Social Determinants of Health   Financial Resource Strain: Patient Declined (11/02/2022)   Overall Financial Resource Strain (CARDIA)    Difficulty of Paying Living Expenses: Patient declined  Food Insecurity: Patient Declined (11/02/2022)   Hunger Vital Sign    Worried About Running Out of Food in the Last Year: Patient declined    Ran Out of Food in the Last Year: Patient declined  Transportation Needs: Unmet Transportation Needs (11/02/2022)   PRAPARE - Administrator, Civil Service (Medical): No    Lack of Transportation (Non-Medical): Yes  Physical Activity: Insufficiently Active (11/02/2022)   Exercise Vital Sign    Days of Exercise per Week: 4 days    Minutes of Exercise per Session: 10 min  Stress: No Stress Concern Present (11/02/2022)   [employer] of Occupational Health - Occupational Stress  Questionnaire    Feeling of Stress : Only a little  Social Connections: Unknown (11/02/2022)   Social Connection and Isolation Panel [NHANES]    Frequency of Communication with Friends and Family: Three times a week    Frequency of Social Gatherings with Friends and Family: Once a week    Attends Religious Services: Patient declined    Database administrator or Organizations: Not on file    Attends Banker Meetings: Not on file    Marital Status: Married    Additional Social History: Patient born and raised in [place].  He finished 2 years of college and had work in his life.  He is retired in 2011.  He lives with his wife to whom he married for 49 years.  He has no children.  He had a good support from his friends.  Allergies:   Allergies  Allergen Reactions   Penicillins Rash    Rash (RN clarified with pt) No SOB/swelling   Sulfa Antibiotics Rash    Metabolic Disorder Labs: Lab Results  Component Value Date   HGBA1C 5.9 11/06/2022   MPG 117 01/26/2020   Lab Results  Component Value Date   PROLACTIN 5.6 02/20/2022   Lab Results  Component Value Date   CHOL 141 11/06/2022   TRIG 228.0 (H) 11/06/2022   HDL 38.40 (L) 11/06/2022   CHOLHDL 4 11/06/2022   VLDL 45.6 (H) 11/06/2022   LDLCALC 115 (H) 01/26/2020   LDLCALC 53 01/15/2018   Lab Results  Component Value Date   TSH 2.11 11/06/2022    Therapeutic Level Labs: No results found for: "LITHIUM" No results found for: "CBMZ" No results found for: "VALPROATE"  Current Medications: Current Outpatient Medications  Medication Sig Dispense Refill   cholecalciferol (VITAMIN D) 1000 UNITS tablet Take 2,000 Units by mouth daily. Taking 2000 units daily  DULoxetine (CYMBALTA) 60 MG capsule Take 1 capsule (60 mg total) by mouth daily. 90 capsule 0   levocetirizine (XYZAL) 5 MG tablet TAKE 1 TABLET(5 MG) BY MOUTH EVERY EVENING (Patient taking differently: Take 5 mg by mouth daily as needed for  allergies.) 90 tablet 1   methylphenidate (CONCERTA) 18 MG PO CR tablet Take 1 tablet (18 mg total) by mouth daily. 30 tablet 0   Polyvinyl Alcohol-Povidone (REFRESH OP) Place 1 drop into both eyes 4 (four) times daily as needed (dryness).     pravastatin (PRAVACHOL) 10 MG tablet TAKE 1 TABLET(10 MG) BY MOUTH DAILY 90 tablet 0   QUEtiapine (SEROQUEL) 25 MG tablet TAKE 1 TABLET(25 MG) BY MOUTH AT BEDTIME 90 tablet 0   RABEprazole (ACIPHEX) 20 MG tablet TAKE ONE TABLET BY MOUTH ONE TIME DAILY 90 tablet 1   tadalafil (CIALIS) 20 MG tablet Take 20 mg by mouth daily as needed for erectile dysfunction.     traZODone (DESYREL) 50 MG tablet TAKE 1 TABLET(50 MG) BY MOUTH AT BEDTIME 90 tablet 0   hypromellose (SYSTANE OVERNIGHT THERAPY) 0.3 % GEL ophthalmic ointment Place 1 Application into both eyes at bedtime. (Patient not taking: Reported on 03/21/2023)     No current facility-administered medications for this visit.    Musculoskeletal: Strength & Muscle Tone: within normal limits Gait & Station: normal Patient leans: N/A  Psychiatric Specialty Exam: Review of Systems  Blood pressure (!) 147/79, pulse 76, height 6' (1.829 m), weight 206 lb (93.4 kg).Body mass index is 27.94 kg/m.  General Appearance: Casual  Eye Contact:  Good  Speech:  Slow  Volume:  Decreased  Mood:  Euthymic  Affect:  Appropriate  Thought Process:  Goal Directed  Orientation:  Full (Time, Place, and Person)  Thought Content:  Logical  Suicidal Thoughts:  No  Homicidal Thoughts:  No  Memory:  Immediate;   Good Recent;   Good Remote;   Good  Judgement:  Intact  Insight:  Present  Psychomotor Activity:  Normal  Concentration:  Concentration: Good and Attention Span: Good  Recall:  Good  Fund of Knowledge:Good  Language: Good  Akathisia:  No  Handed:  Right  AIMS (if indicated):  not done  Assets:  Communication Skills Desire for Improvement Housing Resilience Social Support Transportation  ADL's:  Intact   Cognition: WNL  Sleep:   uses CPAP   Screenings: PHQ2-9    Flowsheet Row Clinical Support from 01/07/2023 in [facility] [place] Office Visit from 11/06/2022 in [facility] [place] Office Visit from 08/08/2022 in [facility] [place] Telephone from 05/08/2022 in [facility] Office Visit from 04/03/2022 in [facility] [place]  PHQ-2 Total Score 0 0 0 4 0  PHQ-9 Total Score 5 4 4 11  --      Flowsheet Row ED from 05/02/2022 in [facility] at [facility] ED from 04/10/2022 in [facility] Department at [facility] ED from 04/05/2022 in [facility] Emergency Department at [facility]  C-SSRS RISK CATEGORY No Risk No Risk No Risk       Assessment and Plan: Patient is a 72 year old man with multiple health issues referred by his provider for psych evaluation.  His chief complaint is unexplained fatigue and tiredness.  I reviewed his history, psychosocial, current medication, blood work results.  Patient reported started more than a year ago  when he was mowing the yard in [place] of his family home which he usually go regularly.  Since then he had extreme fatigue and insomnia.  Patient denies any depressive symptoms, anxiety symptoms, psychotic symptoms but he is taking quetiapine 25 mg and trazodone 50 mg at bedtime.  He also takes Cymbalta 60 mg and Concerta 18 mg.  I had a long discussion with the patient about his symptoms, current medication.  I recommend sometimes psychotropic medication can cause fatigue and tiredness and if he does not have symptoms then he may need to reduce the medication.  I recommend to try stopping the quetiapine if fatigue improved.  Though he had felt much better from the last year but is still believed not back to baseline.  It is unclear what triggered and because of fatigue.   Other etiology could be unexplained viral infection which slowly and gradually subsided.  Patient has seen neurology and workup done with no known etiology.  I am happy to see him again in 2 month for follow-up however if he wants to see and like to make earlier appointment he can call us back.  I will forward my note to his primary care.  No new medication given.  Collaboration of Care: Other provider involved in patient's care AEB notes are available in epic to review.  I will forward my note to his PCP.  Patient/Guardian was advised Release of Information must be obtained prior to any record release in order to collaborate their care with an outside provider. Patient/Guardian was advised if they have not already done so to contact the registration department to sign all necessary forms in order for [place] to release information regarding their care.   Consent: Patient/Guardian gives verbal consent for treatment and assignment of benefits for services provided during this visit. Patient/Guardian expressed understanding and agreed to proceed.   I provided 68 minutes face-to-face in this encounter.  Cleotis Nipper, MD 9/19/20243:16 PM

## 2023-03-25 ENCOUNTER — Other Ambulatory Visit: Payer: Self-pay | Admitting: Physical Medicine and Rehabilitation

## 2023-03-25 ENCOUNTER — Other Ambulatory Visit: Payer: Self-pay | Admitting: Internal Medicine

## 2023-03-25 DIAGNOSIS — J301 Allergic rhinitis due to pollen: Secondary | ICD-10-CM

## 2023-03-25 DIAGNOSIS — G8929 Other chronic pain: Secondary | ICD-10-CM

## 2023-03-25 NOTE — Progress Notes (Signed)
Spoke with patient regarding recent thoracic MRI imaging. Concerning findings of indeterminate osseous lesions within the T6 and T12 vertebrae. Recommend 2-3 month thoracic MRI imaging. I also placed future order for imaging and will send note to his PCP Dr. Sanda Linger. Patient reports he is doing much better and denies pain at this time. Would recommend short course of PT should his pain return.

## 2023-04-05 ENCOUNTER — Ambulatory Visit (INDEPENDENT_AMBULATORY_CARE_PROVIDER_SITE_OTHER): Payer: Medicare Other | Admitting: *Deleted

## 2023-04-05 DIAGNOSIS — Z23 Encounter for immunization: Secondary | ICD-10-CM | POA: Diagnosis not present

## 2023-04-05 NOTE — Progress Notes (Signed)
Injection given RD without complications.

## 2023-04-07 ENCOUNTER — Other Ambulatory Visit: Payer: Self-pay | Admitting: Internal Medicine

## 2023-04-07 DIAGNOSIS — R4189 Other symptoms and signs involving cognitive functions and awareness: Secondary | ICD-10-CM

## 2023-04-07 DIAGNOSIS — R453 Demoralization and apathy: Secondary | ICD-10-CM

## 2023-04-07 DIAGNOSIS — F322 Major depressive disorder, single episode, severe without psychotic features: Secondary | ICD-10-CM

## 2023-04-08 MED ORDER — METHYLPHENIDATE HCL ER (OSM) 18 MG PO TBCR
18.0000 mg | EXTENDED_RELEASE_TABLET | Freq: Every day | ORAL | 0 refills | Status: DC
Start: 2023-04-08 — End: 2023-04-15

## 2023-04-15 ENCOUNTER — Other Ambulatory Visit: Payer: Self-pay | Admitting: Internal Medicine

## 2023-04-15 ENCOUNTER — Telehealth: Payer: Self-pay | Admitting: Internal Medicine

## 2023-04-15 ENCOUNTER — Encounter: Payer: Self-pay | Admitting: Internal Medicine

## 2023-04-15 DIAGNOSIS — R453 Demoralization and apathy: Secondary | ICD-10-CM

## 2023-04-15 DIAGNOSIS — F322 Major depressive disorder, single episode, severe without psychotic features: Secondary | ICD-10-CM

## 2023-04-15 DIAGNOSIS — R4189 Other symptoms and signs involving cognitive functions and awareness: Secondary | ICD-10-CM

## 2023-04-15 MED ORDER — METHYLPHENIDATE HCL ER (OSM) 18 MG PO TBCR
18.0000 mg | EXTENDED_RELEASE_TABLET | Freq: Every day | ORAL | 0 refills | Status: DC
Start: 2023-04-15 — End: 2023-05-15

## 2023-04-15 NOTE — Telephone Encounter (Signed)
Duplicate message.   MyChart in regard has already been forwarded to PCP to advise.

## 2023-04-15 NOTE — Telephone Encounter (Signed)
Patient called and said [facility] has methylphenidate (CONCERTA) 18 MG PO CR tablet on back order. He would like to know if a refill can be sent to the  [facility] [address] - [place], [place] - [address] [place]. AT [facility] [address] instead. Best callback is [phone].

## 2023-05-01 DIAGNOSIS — N390 Urinary tract infection, site not specified: Secondary | ICD-10-CM | POA: Diagnosis not present

## 2023-05-01 DIAGNOSIS — N2581 Secondary hyperparathyroidism of renal origin: Secondary | ICD-10-CM | POA: Diagnosis not present

## 2023-05-01 DIAGNOSIS — Z8679 Personal history of other diseases of the circulatory system: Secondary | ICD-10-CM | POA: Diagnosis not present

## 2023-05-01 DIAGNOSIS — N183 Chronic kidney disease, stage 3 unspecified: Secondary | ICD-10-CM | POA: Diagnosis not present

## 2023-05-01 DIAGNOSIS — I129 Hypertensive chronic kidney disease with stage 1 through stage 4 chronic kidney disease, or unspecified chronic kidney disease: Secondary | ICD-10-CM | POA: Diagnosis not present

## 2023-05-02 LAB — LAB REPORT - SCANNED
Creatinine, POC: 121.8 mg/dL
EGFR: 50

## 2023-05-09 ENCOUNTER — Ambulatory Visit (HOSPITAL_BASED_OUTPATIENT_CLINIC_OR_DEPARTMENT_OTHER): Payer: Medicare Other | Admitting: Psychiatry

## 2023-05-09 ENCOUNTER — Encounter (HOSPITAL_COMMUNITY): Payer: Self-pay | Admitting: Psychiatry

## 2023-05-09 VITALS — BP 156/86 | HR 79 | Resp 18 | Ht 73.0 in | Wt 203.2 lb

## 2023-05-09 DIAGNOSIS — F419 Anxiety disorder, unspecified: Secondary | ICD-10-CM

## 2023-05-09 DIAGNOSIS — R5383 Other fatigue: Secondary | ICD-10-CM

## 2023-05-09 NOTE — Progress Notes (Signed)
BH MD/PA/NP OP Progress Note  Patient location; office Provider location; office  05/09/2023 11:33 AM Marcus Crawford  MRN:  [id number]  Chief Complaint:  Chief Complaint  Patient presents with   Follow-up   HPI: Patient is a 72 year old retired, married man who was seen first time 4 weeks ago as referred from primary care and neurology.  Patient was taking Cymbalta 90 mg, Seroquel 25 mg and Concerta 18 mg prescribed by PCP to help his chronic fatigue, underlying anxiety and depression.  Patient told he never had depression and is not sure why he is taking these medication.  He does report a lot of fatigue, lack of energy, unmotivated to do things.  He had a episode last year while he was doing lawn more in [place] at his property.  He denies any feeling of hopelessness or worthlessness.  He also saw neurology after he had a few ER visit for extreme fatigue.  His neurologist did workup for myasthenia, Parkinson, B12 and memory check but so far did not find anything abnormal.  He had a brain MRI which is normal.  We recommended to stop the quetiapine as sometime psychotropic medication can cause fatigue.  He had stopped the quetiapine and he is seeing some improvement in his energy level.  However he still have some time unexplained fatigue.  He like to go to [place] to do lawn more for his family property but he does not have motivation to do that.  He sleeps okay.  He does not feel hopeless.  He denies any hallucination.  He does use CPAP that helped his sleep.  Denies any aggression, violence.  He has good social network and good friends.  He enjoys watching television, goes to grocery store.  Elect to keep himself busy.  Sometimes he works on his truck and that gives him a lot of enjoyment.  His appetite is okay.  His weight is unchanged from the past.  Visit Diagnosis:    ICD-10-CM   1. Fatigue, unspecified type  R53.83     2. Anxiety  F41.9       Past Psychiatric History: No history of  suicidal attempt, mania, psychosis, delusion, PTSD or inpatient services.  Started taking antidepressant last year from primary care.  He was given Seroquel, Cymbalta and Concerta.  Unexplained fatigue after the episode while doing the lawn more.  Had a few ER visit for extreme fatigue.  Seroquel was discontinued by this Clinical research associate.  Past Medical History:  Past Medical History:  Diagnosis Date   Anemia    early 20's   Bell's palsy 1977   Blood clot in vein 2005   left leg "blood clot"-treated as OP; no residual   Blood transfusion without reported diagnosis    with appendectomy   Borderline diabetic    no meds   Cancer (HCC) 08/2013   prostate/ had surgery/ no chemo or radiation   Cataract    forming   CKD (chronic kidney disease) stage 3, GFR 30-59 ml/min (HCC) saw dr Hyman Hopes 6 months ago   on rampiril for kidney function also   Colonic polyp    X3 ,hyperplastic   Diverticulosis of colon    GERD (gastroesophageal reflux disease)    H/O hiatal hernia    Hemorrhoid    Hyperlipidemia    Hypertension    past hx - no meds since 2013 with  adrenal gland surgery   Kidney stone on left side 2013   asymptomatic , incidental finding  Migraine last 6-7 yrs ago   PMH of ; resolved post adrenal adenoma resection   Nephrolithiasis 2013   kidney stone  as incidental finding on imaging   PONV (postoperative nausea and vomiting)     Past Surgical History:  Procedure Laterality Date   adrenal venous sampling  03-2012   ADRENALECTOMY  04/14/2012   Procedure: ADRENALECTOMY;  Surgeon: Axel Filler, MD;  Location: [facility];  Service: General;  Laterality: Left;  Left Adrenal Excision   APPENDECTOMY  1985   COLONOSCOPY     last 9/12, Dr Russella Dar; due 2017   ESOPHAGEAL DILATION  2001   Dr Corinda Gubler   POLYPECTOMY      X 3   ROBOT ASSISTED LAPAROSCOPIC RADICAL PROSTATECTOMY N/A 09/07/2013   Procedure: ROBOTIC ASSISTED LAPAROSCOPIC RADICAL PROSTATECTOMY LEVEL 1;  Surgeon: Crecencio Mc, MD;  Location: [facility];  Service: Urology;  Laterality: N/A;    Family Psychiatric History: Reviewed  Family History:  Family History  Problem Relation Age of Onset   Rectal cancer Mother    Colon cancer Mother 68   Heart attack Mother 59   Colon polyps Sister    Lung cancer Sister        smoker   Diabetes Sister    Breast cancer Sister    Alzheimer's disease Sister    Heart failure Brother    Kidney cancer Brother    Prostate cancer Brother 21   Heart disease Brother    Hypertension Brother    Kidney cancer Brother    Diabetes Brother    Heart attack Brother 51   Esophageal cancer Maternal Uncle    Kidney cancer Maternal Uncle    Stomach cancer Neg Hx     Social History:  Social History   Socioeconomic History   Marital status: Married    Spouse name: Stanton Kidney   Number of children: 0   Years of education: Not on file   Highest education level: Some college, no degree  Occupational History   Occupation: retired  Tobacco Use   Smoking status: Never   Smokeless tobacco: Never  Vaping Use   Vaping status: Never Used  Substance and Sexual Activity   Alcohol use: Not Currently   Drug use: No   Sexual activity: Yes  Other Topics Concern   Not on file  Social History Narrative   Right handed        Are you currently employed ?    What is your current occupation?retired   Do you live at home alone?   Who lives with you?    What type of home do you live in: 1 story or 2 story?        Social Determinants of Health   Financial Resource Strain: Patient Declined (11/02/2022)   Overall Financial Resource Strain (CARDIA)    Difficulty of Paying Living Expenses: Patient declined  Food Insecurity: Patient Declined (11/02/2022)   Hunger Vital Sign    Worried About Running Out of Food in the Last Year: Patient declined    Ran Out of Food in the Last Year: Patient declined  Transportation Needs: Unmet Transportation Needs (11/02/2022)   PRAPARE - Administrator, Civil Service  (Medical): No    Lack of Transportation (Non-Medical): Yes  Physical Activity: Insufficiently Active (11/02/2022)   Exercise Vital Sign    Days of Exercise per Week: 4 days    Minutes of Exercise per Session: 10 min  Stress: No Stress Concern Present (11/02/2022)  [employer] of Occupational Health - Occupational Stress Questionnaire    Feeling of Stress : Only a little  Social Connections: Unknown (11/02/2022)   Social Connection and Isolation Panel [NHANES]    Frequency of Communication with Friends and Family: Three times a week    Frequency of Social Gatherings with Friends and Family: Once a week    Attends Religious Services: Patient declined    Database administrator or Organizations: Not on file    Attends Banker Meetings: Not on file    Marital Status: Married    Allergies:  Allergies  Allergen Reactions   Penicillins Rash    Rash (RN clarified with pt) No SOB/swelling   Sulfa Antibiotics Rash    Metabolic Disorder Labs: Lab Results  Component Value Date   HGBA1C 5.9 11/06/2022   MPG 117 01/26/2020   Lab Results  Component Value Date   PROLACTIN 5.6 02/20/2022   Lab Results  Component Value Date   CHOL 141 11/06/2022   TRIG 228.0 (H) 11/06/2022   HDL 38.40 (L) 11/06/2022   CHOLHDL 4 11/06/2022   VLDL 45.6 (H) 11/06/2022   LDLCALC 115 (H) 01/26/2020   LDLCALC 53 01/15/2018   Lab Results  Component Value Date   TSH 2.11 11/06/2022   TSH 1.760 05/02/2022    Therapeutic Level Labs: No results found for: "LITHIUM" No results found for: "VALPROATE" No results found for: "CBMZ"  Current Medications: Current Outpatient Medications  Medication Sig Dispense Refill   cholecalciferol (VITAMIN D) 1000 UNITS tablet Take 2,000 Units by mouth daily. Taking 2000 units daily     DULoxetine (CYMBALTA) 60 MG capsule Take 1 capsule (60 mg total) by mouth daily. 90 capsule 0   methylphenidate (CONCERTA) 18 MG PO CR tablet Take 1 tablet (18 mg total)  by mouth daily. 30 tablet 0   Polyvinyl Alcohol-Povidone (REFRESH OP) Place 1 drop into both eyes 4 (four) times daily as needed (dryness).     QUEtiapine (SEROQUEL) 25 MG tablet TAKE 1 TABLET(25 MG) BY MOUTH AT BEDTIME 90 tablet 0   traZODone (DESYREL) 50 MG tablet TAKE 1 TABLET(50 MG) BY MOUTH AT BEDTIME 90 tablet 0   levocetirizine (XYZAL) 5 MG tablet TAKE ONE TABLET BY MOUTH EVERY EVENING 90 tablet 1   pravastatin (PRAVACHOL) 10 MG tablet TAKE 1 TABLET(10 MG) BY MOUTH DAILY 90 tablet 0   RABEprazole (ACIPHEX) 20 MG tablet TAKE ONE TABLET BY MOUTH ONE TIME DAILY 90 tablet 1   tadalafil (CIALIS) 20 MG tablet Take 20 mg by mouth daily as needed for erectile dysfunction.     No current facility-administered medications for this visit.     Musculoskeletal: Strength & Muscle Tone: within normal limits Gait & Station: normal Patient leans: N/A  Psychiatric Specialty Exam: Review of Systems  Blood pressure (!) 156/86, pulse 79, resp. rate 18, height 6\' 1"  (1.854 m), weight 203 lb 3.2 oz (92.2 kg).Body mass index is 26.81 kg/m.  General Appearance: Casual  Eye Contact:  Good  Speech:  Clear and Coherent  Volume:  Normal  Mood:  Euthymic  Affect:  Appropriate  Thought Process:  Goal Directed  Orientation:  Full (Time, Place, and Person)  Thought Content: WDL   Suicidal Thoughts:  No  Homicidal Thoughts:  No  Memory:  Immediate;   Good Recent;   Good Remote;   Good  Judgement:  Good  Insight:  Good  Psychomotor Activity:  Normal  Concentration:  Concentration: Good and Attention  Span: Good  Recall:  Good  Fund of Knowledge: Good  Language: Good  Akathisia:  No  Handed:  Right  AIMS (if indicated): not done  Assets:  Communication Skills Desire for Improvement Housing Resilience Social Support Talents/Skills Transportation  ADL's:  Intact  Cognition: WNL  Sleep:  Good   Screenings: GAD-7    Flowsheet Row [facility]  Total GAD-7 Score 1      PHQ2-9    Flowsheet Row Office Visit [facility]/21/2023 [facility] Clinical Support from 01/07/2023 in [facility] [place] Office Visit from 11/06/2022 in [facility] [place] Office Visit from 08/08/2022 in [facility] [place] Telephone from 05/08/2022 in [facility] Coord[facility]  PHQ-2 Total Score 0 0 0 0 4  PHQ-9 Total Score 4 5 4 4 11       Flowsheet Row Office Visit from 03/21/2023 in [facility] ED from 05[facility]02/2022 in [facility] Department at [facility] ED from 04/10/2022 in [facility] Emergency Department at [facility]  C-SSRS RISK CATEGORY No Risk No Risk No Risk        Assessment and Plan: Patient is 72 year old man with multiple health issues who is referred from his provider for psychiatric evaluation and he was seen first time 6 weeks ago.  We discussed and explained chronic fatigue.  He does not report any depressive symptoms.  I recommend to cut down his quetiapine to see if that helps his energy level.  He noticed marginal improvement.  He is not happy that he has given the diagnosis of depression and he does not have depressive symptoms.  His medicines are given by his PCP.  Patient told he was also told to consider ECT.  At this time I do not believe we need any shock treatment.  I recommend he could try cutting down his Cymbalta from 60 mg to 30 mg however if he notices anxiety coming back then he may need to stay on the same medication.  He reported his anxiety is not as bad but he more worries about his chronic fatigue which ongoing.  I offered therapy but patient has good social network and he does not feel he needed.  I will forward my note to his primary care.  Patient like to have a follow-up in 3  months.  Collaboration of Care: Collaboration of Care: Other provider involved in patient's care AEB I will forward my note to primary care.  Patient/Guardian was advised Release of Information must be obtained prior to any record release in order to collaborate their care with an outside provider. Patient/Guardian was advised if they have not already done so to contact the registration department to sign all necessary forms in order for [place] to release information regarding their care.   Consent: Patient/Guardian gives verbal consent for treatment and assignment of benefits for services provided during this visit. Patient/Guardian expressed understanding and agreed to proceed.   I spent 25 minutes face-to-face time with the patient during this encounter.  Cleotis Nipper, MD 05/09/2023, 11:33 AM

## 2023-05-13 ENCOUNTER — Other Ambulatory Visit: Payer: Self-pay | Admitting: Internal Medicine

## 2023-05-13 DIAGNOSIS — F322 Major depressive disorder, single episode, severe without psychotic features: Secondary | ICD-10-CM

## 2023-05-13 DIAGNOSIS — R453 Demoralization and apathy: Secondary | ICD-10-CM

## 2023-05-13 DIAGNOSIS — R4189 Other symptoms and signs involving cognitive functions and awareness: Secondary | ICD-10-CM

## 2023-05-14 DIAGNOSIS — H04123 Dry eye syndrome of bilateral lacrimal glands: Secondary | ICD-10-CM | POA: Diagnosis not present

## 2023-05-15 ENCOUNTER — Ambulatory Visit (INDEPENDENT_AMBULATORY_CARE_PROVIDER_SITE_OTHER): Payer: Medicare Other | Admitting: Internal Medicine

## 2023-05-15 ENCOUNTER — Encounter: Payer: Self-pay | Admitting: Internal Medicine

## 2023-05-15 VITALS — BP 162/102 | HR 66 | Temp 98.2°F | Resp 16 | Ht 73.0 in | Wt 203.8 lb

## 2023-05-15 DIAGNOSIS — E785 Hyperlipidemia, unspecified: Secondary | ICD-10-CM | POA: Diagnosis not present

## 2023-05-15 DIAGNOSIS — I10 Essential (primary) hypertension: Secondary | ICD-10-CM | POA: Diagnosis not present

## 2023-05-15 MED ORDER — PRAVASTATIN SODIUM 10 MG PO TABS: 10.0000 mg | ORAL_TABLET | Freq: Every day | ORAL | 0 refills | Status: DC

## 2023-05-15 MED ORDER — INDAPAMIDE 1.25 MG PO TABS: 1.2500 mg | ORAL_TABLET | Freq: Every day | ORAL | 0 refills | Status: DC

## 2023-05-15 NOTE — Patient Instructions (Signed)
Hypertension, Adult High blood pressure (hypertension) is when the force of blood pumping through the arteries is too strong. The arteries are the blood vessels that carry blood from the heart throughout the body. Hypertension forces the heart to work harder to pump blood and may cause arteries to become narrow or stiff. Untreated or uncontrolled hypertension can lead to a heart attack, heart failure, a stroke, kidney disease, and other problems. A blood pressure reading consists of a higher number over a lower number. Ideally, your blood pressure should be below 120/80. The first ("top") number is called the systolic pressure. It is a measure of the pressure in your arteries as your heart beats. The second ("bottom") number is called the diastolic pressure. It is a measure of the pressure in your arteries as the heart relaxes. What are the causes? The exact cause of this condition is not known. There are some conditions that result in high blood pressure. What increases the risk? Certain factors may make you more likely to develop high blood pressure. Some of these risk factors are under your control, including: Smoking. Not getting enough exercise or physical activity. Being overweight. Having too much fat, sugar, calories, or salt (sodium) in your diet. Drinking too much alcohol. Other risk factors include: Having a personal history of heart disease, diabetes, high cholesterol, or kidney disease. Stress. Having a family history of high blood pressure and high cholesterol. Having obstructive sleep apnea. Age. The risk increases with age. What are the signs or symptoms? High blood pressure may not cause symptoms. Very high blood pressure (hypertensive crisis) may cause: Headache. Fast or irregular heartbeats (palpitations). Shortness of breath. Nosebleed. Nausea and vomiting. Vision changes. Severe chest pain, dizziness, and seizures. How is this diagnosed? This condition is diagnosed by  measuring your blood pressure while you are seated, with your arm resting on a flat surface, your legs uncrossed, and your feet flat on the floor. The cuff of the blood pressure monitor will be placed directly against the skin of your upper arm at the level of your heart. Blood pressure should be measured at least twice using the same arm. Certain conditions can cause a difference in blood pressure between your right and left arms. If you have a high blood pressure reading during one visit or you have normal blood pressure with other risk factors, you may be asked to: Return on a different day to have your blood pressure checked again. Monitor your blood pressure at home for 1 week or longer. If you are diagnosed with hypertension, you may have other blood or imaging tests to help your health care provider understand your overall risk for other conditions. How is this treated? This condition is treated by making healthy lifestyle changes, such as eating healthy foods, exercising more, and reducing your alcohol intake. You may be referred for counseling on a healthy diet and physical activity. Your health care provider may prescribe medicine if lifestyle changes are not enough to get your blood pressure under control and if: Your systolic blood pressure is above 130. Your diastolic blood pressure is above 80. Your personal target blood pressure may vary depending on your medical conditions, your age, and other factors. Follow these instructions at home: Eating and drinking  Eat a diet that is high in fiber and potassium, and low in sodium, added sugar, and fat. An example of this eating plan is called the DASH diet. DASH stands for Dietary Approaches to Stop Hypertension. To eat this way: Eat   plenty of fresh fruits and vegetables. Try to fill one half of your plate at each meal with fruits and vegetables. Eat whole grains, such as whole-wheat pasta, brown rice, or whole-grain bread. Fill about one  fourth of your plate with whole grains. Eat or drink low-fat dairy products, such as skim milk or low-fat yogurt. Avoid fatty cuts of meat, processed or cured meats, and poultry with skin. Fill about one fourth of your plate with lean proteins, such as fish, chicken without skin, beans, eggs, or tofu. Avoid pre-made and processed foods. These tend to be higher in sodium, added sugar, and fat. Reduce your daily sodium intake. Many people with hypertension should eat less than 1,500 mg of sodium a day. Do not drink alcohol if: Your health care provider tells you not to drink. You are pregnant, may be pregnant, or are planning to become pregnant. If you drink alcohol: Limit how much you have to: 0-1 drink a day for women. 0-2 drinks a day for men. Know how much alcohol is in your drink. In the [place] one drink equals one 12 oz bottle of beer (355 mL), one 5 oz glass of wine (148 mL), or one 1 oz glass of hard liquor (44 mL). Lifestyle  Work with your health care provider to maintain a healthy body weight or to lose weight. Ask what an ideal weight is for you. Get at least 30 minutes of exercise that causes your heart to beat faster (aerobic exercise) most days of the week. Activities may include walking, swimming, or biking. Include exercise to strengthen your muscles (resistance exercise), such as Pilates or lifting weights, as part of your weekly exercise routine. Try to do these types of exercises for 30 minutes at least 3 days a week. Do not use any products that contain nicotine or tobacco. These products include cigarettes, chewing tobacco, and vaping devices, such as e-cigarettes. If you need help quitting, ask your health care provider. Monitor your blood pressure at home as told by your health care provider. Keep all follow-up visits. This is important. Medicines Take over-the-counter and prescription medicines only as told by your health care provider. Follow directions carefully. Blood  pressure medicines must be taken as prescribed. Do not skip doses of blood pressure medicine. Doing this puts you at risk for problems and can make the medicine less effective. Ask your health care provider about side effects or reactions to medicines that you should watch for. Contact a health care provider if you: Think you are having a reaction to a medicine you are taking. Have headaches that keep coming back (recurring). Feel dizzy. Have swelling in your ankles. Have trouble with your vision. Get help right away if you: Develop a severe headache or confusion. Have unusual weakness or numbness. Feel faint. Have severe pain in your chest or abdomen. Vomit repeatedly. Have trouble breathing. These symptoms may be an emergency. Get help right away. Call 911. Do not wait to see if the symptoms will go away. Do not drive yourself to the hospital. Summary Hypertension is when the force of blood pumping through your arteries is too strong. If this condition is not controlled, it may put you at risk for serious complications. Your personal target blood pressure may vary depending on your medical conditions, your age, and other factors. For most people, a normal blood pressure is less than 120/80. Hypertension is treated with lifestyle changes, medicines, or a combination of both. Lifestyle changes include losing weight, eating a healthy,   low-sodium diet, exercising more, and limiting alcohol. This information is not intended to replace advice given to you by your health care provider. Make sure you discuss any questions you have with your health care provider. Document Revised: 04/25/2021 Document Reviewed: 04/25/2021 Elsevier Patient Education  2024 [employer]  

## 2023-05-15 NOTE — Progress Notes (Unsigned)
Subjective:  Patient ID: Marcus Crawford, male    DOB: December 19, 1950  Age: 72 y.o. MRN: [id number]  CC: Hypertension   HPI Marcus Crawford presents for f/up ----  Discussed the use of AI scribe software for clinical note transcription with the patient, who gave verbal consent to proceed.  History of Present Illness   The patient, with a history of hypertension and kidney disease, presents with persistent weakness, particularly pronounced in the mornings. He denies any associated headache, blurred vision, chest pain, or shortness of breath. However, he reports occasional dizziness when standing for prolonged periods. He has been managing his sleep with CPAP since May, which he believes has been beneficial.  The patient's blood pressure has been consistently high, as noted in recent visits to his kidney specialist. Despite the elevated readings, he denies any symptoms typically associated with hypertension such as chest pain, shortness of breath, swelling, or lightheadedness.  He has recently seen a psychiatrist, who discussed his fatigue and antidepressant regimen. The psychiatrist also mentioned a potential viral infection, but the patient does not recall any specific insect bites. He has not seen a neurologist recently, but did see a urologist in March.       Outpatient Medications Prior to Visit  Medication Sig Dispense Refill   cholecalciferol (VITAMIN D) 1000 UNITS tablet Take 2,000 Units by mouth daily. Taking 2000 units daily     DULoxetine (CYMBALTA) 60 MG capsule Take 1 capsule (60 mg total) by mouth daily. 90 capsule 0   levocetirizine (XYZAL) 5 MG tablet TAKE ONE TABLET BY MOUTH EVERY EVENING 90 tablet 1   Polyvinyl Alcohol-Povidone (REFRESH OP) Place 1 drop into both eyes 4 (four) times daily as needed (dryness).     RABEprazole (ACIPHEX) 20 MG tablet TAKE ONE TABLET BY MOUTH ONE TIME DAILY 90 tablet 1   tadalafil (CIALIS) 20 MG tablet Take 20 mg by mouth daily as needed for  erectile dysfunction.     methylphenidate (CONCERTA) 18 MG PO CR tablet Take 1 tablet (18 mg total) by mouth daily. 30 tablet 0   pravastatin (PRAVACHOL) 10 MG tablet TAKE 1 TABLET(10 MG) BY MOUTH DAILY 90 tablet 0   QUEtiapine (SEROQUEL) 25 MG tablet TAKE 1 TABLET(25 MG) BY MOUTH AT BEDTIME 90 tablet 0   traZODone (DESYREL) 50 MG tablet TAKE 1 TABLET(50 MG) BY MOUTH AT BEDTIME (Patient not taking: Reported on 05/15/2023) 90 tablet 0   No facility-administered medications prior to visit.    ROS Review of Systems  Constitutional:  Positive for fatigue. Negative for unexpected weight change.  HENT: Negative.    Respiratory: Negative.  Negative for apnea, shortness of breath and wheezing.   Cardiovascular:  Negative for chest pain, palpitations and leg swelling.  Gastrointestinal:  Negative for abdominal pain, constipation, diarrhea, nausea and vomiting.  Genitourinary: Negative.   Musculoskeletal: Negative.   Skin: Negative.   Neurological:  Positive for weakness. Negative for dizziness, numbness and headaches.  Psychiatric/Behavioral:  Positive for confusion, decreased concentration and dysphoric mood. Negative for agitation, behavioral problems, self-injury, sleep disturbance and suicidal ideas. The patient is not nervous/anxious.     Objective:  BP (!) 162/102 (BP Location: Right Arm, Patient Position: Sitting, Cuff Size: Large)   Pulse 66   Temp 98.2 F (36.8 C) (Oral)   Resp 16   Ht 6\' 1"  (1.854 m)   Wt 203 lb 12.8 oz (92.4 kg)   SpO2 98%   BMI 26.89 kg/m   BP Readings from  Last 3 Encounters:  05/15/23 (!) 162/102  02/06/23 134/86  01/15/23 122/60    Wt Readings from Last 3 Encounters:  05/15/23 203 lb 12.8 oz (92.4 kg)  02/06/23 207 lb (93.9 kg)  01/15/23 206 lb 6.4 oz (93.6 kg)    Physical Exam Vitals reviewed.  HENT:     Mouth/Throat:     Mouth: Mucous membranes are moist.  Eyes:     General: No scleral icterus.    Conjunctiva/sclera: Conjunctivae normal.   Cardiovascular:     Rate and Rhythm: Regular rhythm. Bradycardia present.     Heart sounds: No murmur heard.    No friction rub. No gallop.     Comments: EKG- SB, 59 bpm No LVH, Q waves, or ST/T waves  Pulmonary:     Effort: Pulmonary effort is normal.     Breath sounds: No stridor. No wheezing, rhonchi or rales.  Abdominal:     General: Abdomen is flat.     Palpations: There is no mass.     Tenderness: There is no abdominal tenderness. There is no guarding.     Hernia: No hernia is present.  Musculoskeletal:        General: Normal range of motion.     Cervical back: Neck supple.     Right lower leg: No edema.     Left lower leg: No edema.  Lymphadenopathy:     Cervical: No cervical adenopathy.  Skin:    General: Skin is warm and dry.  Neurological:     General: No focal deficit present.     Mental Status: He is alert. Mental status is at baseline.     Lab Results  Component Value Date   WBC 5.2 02/06/2023   HGB 13.9 02/06/2023   HCT 43.2 02/06/2023   PLT 167.0 02/06/2023   GLUCOSE 118 (H) 02/06/2023   CHOL 141 11/06/2022   TRIG 228.0 (H) 11/06/2022   HDL 38.40 (L) 11/06/2022   LDLDIRECT 79.0 11/06/2022   LDLCALC 115 (H) 01/26/2020   ALT 17 02/06/2023   AST 8 02/06/2023   NA 139 02/06/2023   K 4.8 02/06/2023   CL 104 02/06/2023   CREATININE 1.49 02/06/2023   BUN 24 (H) 02/06/2023   CO2 30 02/06/2023   TSH 2.11 11/06/2022   PSA 0.00 Repeated and verified X2. (L) 02/06/2023   INR 1.12 02/28/2012   HGBA1C 5.9 11/06/2022   MICROALBUR 0.4 10/14/2008    MR THORACIC SPINE WO CONTRAST  Result Date: 03/23/2023 CLINICAL DATA:  Provided history: Chronic bilateral thoracic back pain. Mid back pain. EXAM: MRI THORACIC SPINE WITHOUT CONTRAST TECHNIQUE: Multiplanar, multisequence MR imaging of the thoracic spine was performed. No intravenous contrast was administered. COMPARISON:  Radiographs of the thoracic spine 02/13/2023. FINDINGS: Alignment: Mildly exaggerated  thoracic kyphosis. Slight grade 1 anterolisthesis at T2-T3 and T3-T4. Vertebrae: Multilevel Schmorl nodes. This includes Schmorl nodes within the T7 inferior endplate and T8 superior endplate which have associated marrow edema. Mild degenerative endplate edema elsewhere at T7-T8. 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 Indeterminate 12 mm T2 hyperintense and T1 hypointense lesion within the right T6 pedicle, pars and transverse process (for instance as seen on series 18, image 5) (series 20, image 18). Indeterminate 8 mm T2 hyperintense and T1 hypointense lesion within the right aspect of the T12 vertebral body (for instance as seen on series 18, image 6). Multilevel ventrolateral osteophytes within the lower thoracic spine and at T12-L1. Cord: No signal abnormality identified within the  thoracic spinal cord. Paraspinal and other soft tissues: No acute finding within included portions of the thorax or upper abdomen/retroperitoneum. No paraspinal mass or collection. Disc levels: Multilevel thoracic disc degeneration, greatest at T7-T8 (mild-to-moderate) and T12-L1 (moderate). Slight disc bulge at T10-T11 without significant spinal canal stenosis. No significant disc herniation or spinal canal stenosis elsewhere within the thoracic spine. Multilevel facet arthrosis. Facet spurring results in moderate left neural foraminal narrowing at T8-T9. No more than mild neural foraminal narrowing elsewhere within the thoracic spine. Cervical spondylosis is incompletely assessed on localizer imaging. Impression #1 will be called to the ordering clinician or representative by the Radiologist Assistant, and communication documented in the PACS or [employer]. IMPRESSION: 1. Indeterminate osseous lesions within the T6 and T12 vertebrae, as described. While these could reflect atypical hemangiomas, the imaging features are nonspecific and alternative etiologies (such as other primary bone lesion or osseous metastatic  disease) cannot be excluded. A short-interval follow-up MRI in 2-3 months is recommended to ensure stability. 2. Thoracic spondylosis as outlined. Disc degeneration is greatest at T7-T8 (mild-to-moderate) and T12-L1 (moderate). There is marrow edema associated with small Schmorl nodes at the T7-T8 level, and these Schmorl nodes may be recent. Mild degenerative endplate edema elsewhere at the T7-T8 level. No significant spinal canal stenosis. Multilevel foraminal stenosis, greatest on the left at T8-T9 (moderate at this site). Electronically Signed   By: Jackey Loge D.O.   On: 03/23/2023 14:13    Assessment & Plan:   Essential hypertension- His BP is too high. Will discontinue concerta and start lozol -     EKG 12-Lead -     Indapamide; Take 1 tablet (1.25 mg total) by mouth daily.  Dispense: 90 tablet; Refill: 0  Hyperlipidemia with target LDL less than 130 - LDL goal achieved. Doing well on the statin  -     Pravastatin Sodium; Take 1 tablet (10 mg total) by mouth daily.  Dispense: 90 tablet; Refill: 0     Follow-up: Return in about 6 weeks (around 06/26/2023).  Sanda Linger, MD

## 2023-05-16 ENCOUNTER — Other Ambulatory Visit: Payer: Self-pay | Admitting: Internal Medicine

## 2023-05-16 DIAGNOSIS — F322 Major depressive disorder, single episode, severe without psychotic features: Secondary | ICD-10-CM

## 2023-05-16 DIAGNOSIS — F5104 Psychophysiologic insomnia: Secondary | ICD-10-CM

## 2023-05-20 ENCOUNTER — Encounter: Payer: Self-pay | Admitting: Internal Medicine

## 2023-05-21 ENCOUNTER — Other Ambulatory Visit: Payer: Self-pay | Admitting: Internal Medicine

## 2023-05-21 DIAGNOSIS — G252 Other specified forms of tremor: Secondary | ICD-10-CM

## 2023-05-24 NOTE — Progress Notes (Signed)
NEUROLOGY FOLLOW UP OFFICE NOTE  Marcus Crawford [id number]  Subjective:  Marcus Crawford is a 72 y.o. year old male with a history of HTN, HLD, CKD, pre-diabetes, vit D deficiency, prostate cancer, bursitis of right hip, RBBB, Bell's Palsy c/b chronic right face droop, hypogonadism, and fatigue who we last saw on 11/14/22 for tremor.  To briefly review: Initial consultation (04/16/22): Patient has been having progressive weakness since May or June of 2023. He first noticed that when he did anything, he would get tired very quickly. He denies weakness at first, but just wouldn't have energy to go very long. He would have to rest to get his energy up again. He may have started noticing his stride while walking was changing for the last year. He is not sure how he was walking different, but noticed he was.   Patient feels weak in his arms and legs. He mentioned that about August 2023 he noticed leg weakness and September 2023 he started noticing his arms felt funny and weak. He does not notice fluctuating of his weakness. He feels his strength is so poor and his fatigue is so much that he ends up laying around than being upright. He has lost 15-20 pounds over this time period as well. He says he has no appetite. He does not feel he can think straight. He feels like he has brain fog. He also feels like his vision is affected. He has dry eyes and feels like his vision is poor. He denies double vision. He also feels like he cannot sit still. He is restless. He mentions that his hand writing is poorer than previous and perhaps smaller.   He is sleeping very poorly. He takes ambien to help him sleep but he is still only sleeping from 9:30pm to 3:30am (~6 hours per night). He feels rested but only for a short period of time. This has been going on for about 2 months. He thinks he snores a lot. He had a sleep study scheduled a couple of weeks ago, but this was not completed because per patient, he wasn't in  good enough health, so it was cancelled.    He endorses poor mood as well. His is upset about not only his condition but also his wife. She is going through cancer treatments.   Patient states he feels so bad that he feels like he is dying.   Current MG like symptoms: Ptosis: He thinks he occasionally has droopy eyelids, but maybe this has been going on for a long time. Double vision: Not clear Speech: Patient feels like his voice is changing. He feels hoarse. This comes and goes. He thinks it is worse when he gets upset or if he is tired or using his voice a lot. Chewing: None Swallowing: None Breathing: None Arm strength: Feels weak. Harder to brush teeth. Not dropping things.  Leg strength: Feels weak. Feels like he is taking shorter chopper steps per patient.    He currently takes vitamin D 1000 IU per day for several years. He takes no other vitamins.   The patient has not had similar episodes of symptoms in the past.    He endorses some tingling on the bottom of his feet occasionally and has occasional cramping.    Any change in urine color, especially after exertion/physical activity? No   Pseudobulbar affect is absent, but patient does endorse being more emotional.   The patient has not  noticed any recent skin rashes nor  does he report any constitutional symptoms like fever, night sweats. He endorses anorexia and weight loss as above.   EtOH use: None  Restrictive diet? No Family history of neuropathy/myopathy/NM disease? None   Patient presented to the ED on 04/10/22 for weakness. Per documentation, symptoms have been present for the last year, but progressed over the last month. He has lost 15 pounds over the last few months due to poor appetite. He feels very fatigued. There was concern for myasthenia gravis, so neurology was consulted. Neurology was not concerned for MG crisis, so sent the antibodies and recommended outpatient follow up.   04/24/22: AchR binding abs  and striated muscle abs were negative. B1 was elevated to 45.   Patient continues to feel very fatigued and weak. We spoke by phone on 04/23/22, and patient was very concerned about his symptoms, so this follow up appointment was made.   Patient today says he does not feel depressed. He was prescribed a medication for depression but did not take it.   He also has poor sleep. He was given [place] which he took last night and slept 7 hours. He is worried about getting addicted to the medication though.  11/14/22: Labs returned normal. Patient cancelled EMG.    He did see sleep medicine. He is still awaiting CPAP.   He saw his PCP on 11/06/22 with similar complaints of fatigue, daytime sleepiness, apathy, anhedonia, and feeling shaky. Course tremors were noted, so patient was referred back to me today.    Patient mentions that he has noticed this more with stress. He notices it occurs with action. When he is putting down his glasses or eating cereal. When he thinks about it, he will notice it more (like thinking about it makes it worse). He was recently restarted on methylphenidate and thinks this has helped how he is feeling and tremors.   He denies freezing. He denies rest tremor. He denies significant imbalance or falls. He endorses poor smell for 20-30 years. He may have had some kicking in his sleep in the past, but nothing currently that sounds like REM sleep behavior.   He has no family history of tremor.   Relevant current medications: -Cymbalta 60 mg daily (tremor in 2-3%) -Methylphenidate 18 mg daily (jitteriness in 4%, tremor in 3%) -Seroquel 25 mg daily (parkinsonism in < 6%, tremor in 2%) -Trazodone 50 mg daily (nervousness in 15%, tremor in 3-5%)  Most recent Assessment and Plan (11/14/22): This is Marcus Crawford, a 72 y.o. male with shakiness/tremor, especially with movement and enhanced with stress. Etiology is unclear, but there is no current evidence of parkinsonism. This could  be essential tremor vs enhance physiologic tremor with contributions from stress or some of his medications (see above in HPI). Overall, patient does not find symptoms bothersome and they do not prevent him from activities such as eating or drinking.   Plan: -Discussed possible diagnosis and treatment. Patient is not bothered by symptoms currently, so would like to defer. -Patient will reach out with worsening symptoms or new concerns Follow up as needed  Since their last visit: Patient saw psych who did not think patient had depressive symptoms but wondered about a viral infection per patient.   He continues to feel very low energy. He feels a pressure in his chest and arms as well. He has brain fog. Symptoms started around 11/2021. He does not remember any infection or insect bites.  Patient continues to have occasional tremor, but only when  stressed. His wife is going through bone cancer treatment, so he has more responsibilities. The tremor does not affect eating or drinking too much.  MEDICATIONS:  Outpatient Encounter Medications as of 06/06/2023  Medication Sig   cholecalciferol (VITAMIN D) 1000 UNITS tablet Take 2,000 Units by mouth daily. Taking 2000 units daily   DULoxetine (CYMBALTA) 60 MG capsule Take 1 capsule (60 mg total) by mouth daily.   indapamide (LOZOL) 1.25 MG tablet Take 1 tablet (1.25 mg total) by mouth daily.   levocetirizine (XYZAL) 5 MG tablet TAKE ONE TABLET BY MOUTH EVERY EVENING   Polyvinyl Alcohol-Povidone (REFRESH OP) Place 1 drop into both eyes 4 (four) times daily as needed (dryness).   pravastatin (PRAVACHOL) 10 MG tablet Take 1 tablet (10 mg total) by mouth daily.   QUEtiapine (SEROQUEL) 25 MG tablet Take 25 mg by mouth at bedtime.   RABEprazole (ACIPHEX) 20 MG tablet TAKE ONE TABLET BY MOUTH ONE TIME DAILY   tadalafil (CIALIS) 20 MG tablet Take 20 mg by mouth daily as needed for erectile dysfunction.   traZODone (DESYREL) 50 MG tablet TAKE 1 TABLET(50 MG)  BY MOUTH AT BEDTIME   No facility-administered encounter medications on file as of 06/06/2023.    PAST MEDICAL HISTORY: Past Medical History:  Diagnosis Date   Anemia    early 20's   Bell's palsy 1977   Blood clot in vein 2005   left leg "blood clot"-treated as OP; no residual   Blood transfusion without reported diagnosis    with appendectomy   Borderline diabetic    no meds   Cancer (HCC) 08/2013   prostate/ had surgery/ no chemo or radiation   Cataract    forming   CKD (chronic kidney disease) stage 3, GFR 30-59 ml/min (HCC) saw dr Hyman Hopes 6 months ago   on rampiril for kidney function also   Colonic polyp    X3 ,hyperplastic   Diverticulosis of colon    GERD (gastroesophageal reflux disease)    H/O hiatal hernia    Hemorrhoid    Hyperlipidemia    Hypertension    past hx - no meds since 2013 with  adrenal gland surgery   Kidney stone on left side 2013   asymptomatic , incidental finding   Migraine last 6-7 yrs ago   PMH of ; resolved post adrenal adenoma resection   Nephrolithiasis 2013   kidney stone  as incidental finding on imaging   PONV (postoperative nausea and vomiting)     PAST SURGICAL HISTORY: Past Surgical History:  Procedure Laterality Date   adrenal venous sampling  03-2012   ADRENALECTOMY  04/14/2012   Procedure: ADRENALECTOMY;  Surgeon: Axel Filler, MD;  Location: [facility];  Service: General;  Laterality: Left;  Left Adrenal Excision   APPENDECTOMY  1985   COLONOSCOPY     last 9/12, Dr Russella Dar; due 2017   ESOPHAGEAL DILATION  2001   Dr Corinda Gubler   POLYPECTOMY      X 3   ROBOT ASSISTED LAPAROSCOPIC RADICAL PROSTATECTOMY N/A 09/07/2013   Procedure: ROBOTIC ASSISTED LAPAROSCOPIC RADICAL PROSTATECTOMY LEVEL 1;  Surgeon: Crecencio Mc, MD;  Location: [facility];  Service: Urology;  Laterality: N/A;    ALLERGIES: Allergies  Allergen Reactions   Penicillins Rash    Rash (RN clarified with pt) No SOB/swelling   Sulfa Antibiotics Rash    FAMILY  HISTORY: Family History  Problem Relation Age of Onset   Rectal cancer Mother    Colon cancer Mother 45  Heart attack Mother 52   Colon polyps Sister    Lung cancer Sister        smoker   Diabetes Sister    Breast cancer Sister    Alzheimer's disease Sister    Heart failure Brother    Kidney cancer Brother    Prostate cancer Brother 46   Heart disease Brother    Hypertension Brother    Kidney cancer Brother    Diabetes Brother    Heart attack Brother 22   Esophageal cancer Maternal Uncle    Kidney cancer Maternal Uncle    Stomach cancer Neg Hx     SOCIAL HISTORY: Social History   Tobacco Use   Smoking status: Never   Smokeless tobacco: Never  Vaping Use   Vaping status: Never Used  Substance Use Topics   Alcohol use: Not Currently   Drug use: No   Social History   Social History Narrative   Right handed        Are you currently employed ?    What is your current occupation?retired   Do you live at home alone?   Who lives with you?    What type of home do you live in: 1 story or 2 story?    Caffiene  1 daily      Objective:  Vital Signs:  BP (!) 158/90   Pulse 82   Ht 6\' 1"  (1.854 m)   Wt 205 lb (93 kg)   SpO2 98%   BMI 27.05 kg/m   General: No acute distress.  Patient appears well-groomed.   Head:  Normocephalic/atraumatic Neck: supple Lungs: Non-labored breathing on room air  Psych: Flat affect  Neurological Exam: Mental status: alert and oriented, speech fluent and not dysarthric, language intact.  Cranial nerves: CN I: not tested CN II: pupils equal, round and reactive to light, visual fields intact CN III, IV, VI:  full range of motion, no nystagmus, no ptosis CN V: facial sensation intact. CN VII: upper and lower face symmetric CN VIII: hearing intact CN IX, X: uvula midline CN XI: sternocleidomastoid and trapezius muscles intact CN XII: tongue midline  Bulk & Tone: normal, no fasciculations. Motor:  muscle strength 5/5  throughout Deep Tendon Reflexes:  2+ throughout.   Sensation:  Light touch sensation intact. Finger to nose testing:  Without dysmetria.    Gait:  Narrow based. Reduced arm swing. No freezing. Normal turns. Finger and toe tapping abnormal (left > right)  Labs and Imaging review: New results: 02/06/23: BMP significant for glucose 118, Cr 1.49 CBC w/ diff unremarkable Hepatic function panel unremarkable  MRI thoracic spine wo contrast (03/07/23): FINDINGS: Alignment: Mildly exaggerated thoracic kyphosis. Slight grade 1 anterolisthesis at T2-T3 and T3-T4.   Vertebrae: Multilevel Schmorl nodes. This includes Schmorl nodes within the T7 inferior endplate and T8 superior endplate which have associated marrow edema. Mild degenerative endplate edema elsewhere at T7-T8. 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 Indeterminate 12 mm T2 hyperintense and T1 hypointense lesion within the right T6 pedicle, pars and transverse process (for instance as seen on series 18, image 5) (series 20, image 18). Indeterminate 8 mm T2 hyperintense and T1 hypointense lesion within the right aspect of the T12 vertebral body (for instance as seen on series 18, image 6). Multilevel ventrolateral osteophytes within the lower thoracic spine and at T12-L1.   Cord: No signal abnormality identified within the thoracic spinal cord.   Paraspinal and other soft tissues: No acute finding within  included portions of the thorax or upper abdomen/retroperitoneum. No paraspinal mass or collection.   Disc levels:   Multilevel thoracic disc degeneration, greatest at T7-T8 (mild-to-moderate) and T12-L1 (moderate). Slight disc bulge at T10-T11 without significant spinal canal stenosis. No significant disc herniation or spinal canal stenosis elsewhere within the thoracic spine. Multilevel facet arthrosis. Facet spurring results in moderate left neural foraminal narrowing at T8-T9. No more than mild neural foraminal  narrowing elsewhere within the thoracic spine.   Cervical spondylosis is incompletely assessed on localizer imaging.   Impression #1 will be called to the ordering clinician or representative by the Radiologist Assistant, and communication documented in the PACS or [employer].   IMPRESSION: 1. Indeterminate osseous lesions within the T6 and T12 vertebrae, as described. While these could reflect atypical hemangiomas, the imaging features are nonspecific and alternative etiologies (such as other primary bone lesion or osseous metastatic disease) cannot be excluded. A short-interval follow-up MRI in 2-3 months is recommended to ensure stability. 2. Thoracic spondylosis as outlined. Disc degeneration is greatest at T7-T8 (mild-to-moderate) and T12-L1 (moderate). There is marrow edema associated with small Schmorl nodes at the T7-T8 level, and these Schmorl nodes may be recent. Mild degenerative endplate edema elsewhere at the T7-T8 level. No significant spinal canal stenosis. Multilevel foraminal stenosis, greatest on the left at T8-T9 (moderate at this site).  Previously reviewed results: 11/06/22: BMP unremarkable TSH wnl HbA1c: 5.9   Vit D (06/15/22): 48.9  04/24/22: MuSK abs negative AChR blocking abs negative, modulating abs negative   B1: 45 AChR binding abs: negative Striated muscle abs: negative   Normal or unremarkable: CK (325), Mg, TSH, free T4, CBC, Vit D Testosterone: low at 132 (264 is lower limit of normal) BMP with mildly elevated glucose, elevated Cr (1.41) HbA1c: 6.0 B12: 409    MRI brain wo contrast (04/11/22): FINDINGS: Brain: No restricted diffusion to suggest acute or subacute infarct. No acute hemorrhage, mass, mass effect, midline shift. No hydrocephalus or extra-axial collection. No hemosiderin deposition to suggest remote hemorrhage. Scattered T2 hyperintense signal in the periventricular white matter, likely the sequela of mild  chronic small vessel ischemic disease. Cerebral volume is within normal limits for age.   Vascular: Normal arterial flow voids.   Skull and upper cervical spine: Normal marrow signal.   Sinuses/Orbits: No acute finding.   Other: The mastoids are well aerated.   IMPRESSION: No acute intracranial process. No evidence of acute or subacute infarct.   MRI cervical spine wo contrast (04/11/22): FINDINGS: Alignment: Normal   Vertebrae: No fracture, evidence of discitis, or bone lesion.T1 hemangioma.   Cord: Normal signal and morphology.   Posterior Fossa, vertebral arteries, paraspinal tissues: Negative.   Disc levels:   C1-2: Unremarkable.   C2-3: Normal disc space and facet joints. There is no spinal canal stenosis. No neural foraminal stenosis.   C3-4: Small disc bulge with mild facet hypertrophy. There is no spinal canal stenosis. Mild bilateral neural foraminal stenosis.   C4-5: Moderate right and mild left facet hypertrophy. There is no spinal canal stenosis. No neural foraminal stenosis.   C5-6: Normal disc space and facet joints. There is no spinal canal stenosis. No neural foraminal stenosis.   C6-7: Small disc bulge with endplate spurring. There is no spinal canal stenosis. No neural foraminal stenosis.   C7-T1: Normal disc space and facet joints. There is no spinal canal stenosis. No neural foraminal stenosis.   IMPRESSION: 1. No acute abnormality of the cervical spine. 2. Mild bilateral neural  foraminal stenosis at C3-4. 3. No spinal canal stenosis.  Assessment/Plan:  This is Marcus Crawford, a 72 y.o. male with chronic fatigue, occasional tremor with movement or stress, generalized feeling of being unwell. I have seen patient since 04/16/22 from similar complaints without clear etiology. MRI thoracic spine from 03/2023 showed lesions in T6 and T12 vertebrae that are being worked up further to rule out metastatic or primary bone lesions. This is the most  concerning potential etiology. Patient has flat affect and decreased motivation but per patient and psychiatry, he does not have depressive symptoms. He has not improved on Cymbalta and Seroquel. Parkinsonism is also possible, but patient does not have bradykinesia required for the diagnosis. I will continue to monitor for this. There was concern for a viral infection, so I will recheck labs with Lyme and EBV today.   Plan: -Blood work: Lyme, EBV, B12 -Follow up MRI thoracic spine as planned on 06/20/23 -Continue Cymbalta 60 mg daily -Continue CPAP for OSA  Return to clinic in 6 months  Total time spent reviewing records, interview, history/exam, documentation, and coordination of care on day of encounter:  50 min  Jacquelyne Balint, MD

## 2023-06-06 ENCOUNTER — Other Ambulatory Visit: Payer: Medicare Other

## 2023-06-06 ENCOUNTER — Ambulatory Visit (INDEPENDENT_AMBULATORY_CARE_PROVIDER_SITE_OTHER): Payer: Medicare Other | Admitting: Neurology

## 2023-06-06 ENCOUNTER — Encounter: Payer: Self-pay | Admitting: Neurology

## 2023-06-06 VITALS — BP 158/90 | HR 82 | Ht 73.0 in | Wt 205.0 lb

## 2023-06-06 DIAGNOSIS — M791 Myalgia, unspecified site: Secondary | ICD-10-CM | POA: Diagnosis not present

## 2023-06-06 DIAGNOSIS — M546 Pain in thoracic spine: Secondary | ICD-10-CM | POA: Diagnosis not present

## 2023-06-06 DIAGNOSIS — R4589 Other symptoms and signs involving emotional state: Secondary | ICD-10-CM

## 2023-06-06 DIAGNOSIS — R5382 Chronic fatigue, unspecified: Secondary | ICD-10-CM | POA: Diagnosis not present

## 2023-06-06 DIAGNOSIS — R251 Tremor, unspecified: Secondary | ICD-10-CM | POA: Diagnosis not present

## 2023-06-06 DIAGNOSIS — G8929 Other chronic pain: Secondary | ICD-10-CM

## 2023-06-06 NOTE — Addendum Note (Signed)
Addended by: Lenise Herald on: 06/06/2023 03:25 PM   Modules accepted: Orders

## 2023-06-06 NOTE — Patient Instructions (Addendum)
I want to do additional lab testing today. I will be in touch when I have the results.  I want to see the results of your upcoming MRI thoracic spine.  Continue Cymbalta 60 mg daily as this can help with some of your issues.  Continue CPAP for sleep apnea.  I would like see you back in clinic in 6 months.  Please let me know if you have any questions or concerns in the meantime.   The physicians and staff at [facility] are committed to providing excellent care. You may receive a survey requesting feedback about your experience at our office. We strive to receive "very good" responses to the survey questions. If you feel that your experience would prevent you from giving the office a "very good " response, please contact our office to try to remedy the situation. We may be reached at [phone]. Thank you for taking the time out of your busy day to complete the survey.  Jacquelyne Balint, MD [facility]

## 2023-06-07 ENCOUNTER — Telehealth: Payer: Self-pay | Admitting: Internal Medicine

## 2023-06-07 ENCOUNTER — Other Ambulatory Visit: Payer: Self-pay | Admitting: Internal Medicine

## 2023-06-07 DIAGNOSIS — I10 Essential (primary) hypertension: Secondary | ICD-10-CM

## 2023-06-07 MED ORDER — OLMESARTAN MEDOXOMIL 20 MG PO TABS: 20.0000 mg | ORAL_TABLET | Freq: Every day | ORAL | 0 refills | Status: DC

## 2023-06-07 NOTE — Telephone Encounter (Signed)
Patient called and said he has been taking the blood pressure medication that Dr. Yetta Barre prescribed, but it doesn't seem to be helping. He would like to know what Dr. Yetta Barre would recommend. Best callback is [phone].

## 2023-06-07 NOTE — Telephone Encounter (Signed)
Patient is aware of medication updates. Gave a verbal understanding.

## 2023-06-07 NOTE — Telephone Encounter (Signed)
Please advise 

## 2023-06-09 LAB — EPSTEIN-BARR VIRUS VCA ANTIBODY PANEL
EBV NA IgG: 31.6 U/mL — ABNORMAL HIGH
EBV VCA IgG: 113 U/mL — ABNORMAL HIGH
EBV VCA IgM: <36 U/mL

## 2023-06-09 LAB — VITAMIN B12: Vitamin B-12: 320 pg/mL (ref 200–1100)

## 2023-06-09 LAB — LYME DISEASE ANTIBODY WITH REFLEX TO IMMUNOASSAY (IGG, IGM): LYME AB, SCREEN: <=0.9 {index} (ref <=0.90)

## 2023-06-13 ENCOUNTER — Encounter: Payer: Self-pay | Admitting: Neurology

## 2023-06-14 ENCOUNTER — Telehealth: Payer: Self-pay | Admitting: Neurology

## 2023-06-14 NOTE — Telephone Encounter (Signed)
Left message with the after hour service on 06-13-23 at 5:09 pm   Caller states that he had blood work that shows that he has a virus and wants to speak to someone about medication. He was told this was depression and has felt bad for a long time   Please call

## 2023-06-14 NOTE — Telephone Encounter (Signed)
Dr. Loleta Chance called and talked to patient.

## 2023-06-20 ENCOUNTER — Ambulatory Visit
Admission: RE | Admit: 2023-06-20 | Discharge: 2023-06-20 | Disposition: A | Discharge status: Home / Self Care | Payer: Medicare Other | Source: Ambulatory Visit | Attending: Physical Medicine and Rehabilitation | Admitting: Physical Medicine and Rehabilitation

## 2023-06-20 DIAGNOSIS — G8929 Other chronic pain: Secondary | ICD-10-CM | POA: Diagnosis not present

## 2023-06-20 DIAGNOSIS — M546 Pain in thoracic spine: Secondary | ICD-10-CM | POA: Diagnosis not present

## 2023-06-27 ENCOUNTER — Ambulatory Visit: Payer: Medicare Other | Admitting: Internal Medicine

## 2023-06-27 ENCOUNTER — Encounter: Payer: Self-pay | Admitting: Internal Medicine

## 2023-06-27 VITALS — BP 138/88 | HR 87 | Temp 98.1°F | Resp 16 | Ht 73.0 in | Wt 203.6 lb

## 2023-06-27 DIAGNOSIS — N1831 Chronic kidney disease, stage 3a: Secondary | ICD-10-CM | POA: Diagnosis not present

## 2023-06-27 DIAGNOSIS — D511 Vitamin B12 deficiency anemia due to selective vitamin B12 malabsorption with proteinuria: Secondary | ICD-10-CM | POA: Diagnosis not present

## 2023-06-27 DIAGNOSIS — F322 Major depressive disorder, single episode, severe without psychotic features: Secondary | ICD-10-CM | POA: Diagnosis not present

## 2023-06-27 DIAGNOSIS — R400 Somnolence: Secondary | ICD-10-CM

## 2023-06-27 DIAGNOSIS — G4733 Obstructive sleep apnea (adult) (pediatric): Secondary | ICD-10-CM

## 2023-06-27 DIAGNOSIS — I10 Essential (primary) hypertension: Secondary | ICD-10-CM | POA: Diagnosis not present

## 2023-06-27 LAB — URINALYSIS, ROUTINE W REFLEX MICROSCOPIC
Bilirubin Urine: NEGATIVE
Ketones, ur: NEGATIVE
Leukocytes,Ua: NEGATIVE
Nitrite: NEGATIVE
Specific Gravity, Urine: 1.025 (ref 1.000–1.030)
Urine Glucose: NEGATIVE
Urobilinogen, UA: 0.2 (ref 0.0–1.0)
pH: 6.5 (ref 5.0–8.0)

## 2023-06-27 LAB — FOLATE: Folate: 19.5 ng/mL (ref >5.9)

## 2023-06-27 MED ORDER — MODAFINIL 100 MG PO TABS: 100.0000 mg | ORAL_TABLET | Freq: Every day | ORAL | 0 refills | Status: DC

## 2023-06-27 MED ORDER — CYANOCOBALAMIN 1000 MCG/ML IJ SOLN
1000.0000 ug | Freq: Once | INTRAMUSCULAR | Status: AC
  Administered 2023-06-27: 1000 ug via INTRAMUSCULAR

## 2023-06-27 MED ORDER — DULOXETINE HCL 30 MG PO CPEP: 30.0000 mg | ORAL_CAPSULE | Freq: Every day | ORAL | 0 refills | Status: DC

## 2023-06-27 NOTE — Patient Instructions (Signed)
Hypertension, Adult High blood pressure (hypertension) is when the force of blood pumping through the arteries is too strong. The arteries are the blood vessels that carry blood from the heart throughout the body. Hypertension forces the heart to work harder to pump blood and may cause arteries to become narrow or stiff. Untreated or uncontrolled hypertension can lead to a heart attack, heart failure, a stroke, kidney disease, and other problems. A blood pressure reading consists of a higher number over a lower number. Ideally, your blood pressure should be below 120/80. The first ("top") number is called the systolic pressure. It is a measure of the pressure in your arteries as your heart beats. The second ("bottom") number is called the diastolic pressure. It is a measure of the pressure in your arteries as the heart relaxes. What are the causes? The exact cause of this condition is not known. There are some conditions that result in high blood pressure. What increases the risk? Certain factors may make you more likely to develop high blood pressure. Some of these risk factors are under your control, including: Smoking. Not getting enough exercise or physical activity. Being overweight. Having too much fat, sugar, calories, or salt (sodium) in your diet. Drinking too much alcohol. Other risk factors include: Having a personal history of heart disease, diabetes, high cholesterol, or kidney disease. Stress. Having a family history of high blood pressure and high cholesterol. Having obstructive sleep apnea. Age. The risk increases with age. What are the signs or symptoms? High blood pressure may not cause symptoms. Very high blood pressure (hypertensive crisis) may cause: Headache. Fast or irregular heartbeats (palpitations). Shortness of breath. Nosebleed. Nausea and vomiting. Vision changes. Severe chest pain, dizziness, and seizures. How is this diagnosed? This condition is diagnosed by  measuring your blood pressure while you are seated, with your arm resting on a flat surface, your legs uncrossed, and your feet flat on the floor. The cuff of the blood pressure monitor will be placed directly against the skin of your upper arm at the level of your heart. Blood pressure should be measured at least twice using the same arm. Certain conditions can cause a difference in blood pressure between your right and left arms. If you have a high blood pressure reading during one visit or you have normal blood pressure with other risk factors, you may be asked to: Return on a different day to have your blood pressure checked again. Monitor your blood pressure at home for 1 week or longer. If you are diagnosed with hypertension, you may have other blood or imaging tests to help your health care provider understand your overall risk for other conditions. How is this treated? This condition is treated by making healthy lifestyle changes, such as eating healthy foods, exercising more, and reducing your alcohol intake. You may be referred for counseling on a healthy diet and physical activity. Your health care provider may prescribe medicine if lifestyle changes are not enough to get your blood pressure under control and if: Your systolic blood pressure is above 130. Your diastolic blood pressure is above 80. Your personal target blood pressure may vary depending on your medical conditions, your age, and other factors. Follow these instructions at home: Eating and drinking  Eat a diet that is high in fiber and potassium, and low in sodium, added sugar, and fat. An example of this eating plan is called the DASH diet. DASH stands for Dietary Approaches to Stop Hypertension. To eat this way: Eat   plenty of fresh fruits and vegetables. Try to fill one half of your plate at each meal with fruits and vegetables. Eat whole grains, such as whole-wheat pasta, brown rice, or whole-grain bread. Fill about one  fourth of your plate with whole grains. Eat or drink low-fat dairy products, such as skim milk or low-fat yogurt. Avoid fatty cuts of meat, processed or cured meats, and poultry with skin. Fill about one fourth of your plate with lean proteins, such as fish, chicken without skin, beans, eggs, or tofu. Avoid pre-made and processed foods. These tend to be higher in sodium, added sugar, and fat. Reduce your daily sodium intake. Many people with hypertension should eat less than 1,500 mg of sodium a day. Do not drink alcohol if: Your health care provider tells you not to drink. You are pregnant, may be pregnant, or are planning to become pregnant. If you drink alcohol: Limit how much you have to: 0-1 drink a day for women. 0-2 drinks a day for men. Know how much alcohol is in your drink. In the [place] one drink equals one 12 oz bottle of beer (355 mL), one 5 oz glass of wine (148 mL), or one 1 oz glass of hard liquor (44 mL). Lifestyle  Work with your health care provider to maintain a healthy body weight or to lose weight. Ask what an ideal weight is for you. Get at least 30 minutes of exercise that causes your heart to beat faster (aerobic exercise) most days of the week. Activities may include walking, swimming, or biking. Include exercise to strengthen your muscles (resistance exercise), such as Pilates or lifting weights, as part of your weekly exercise routine. Try to do these types of exercises for 30 minutes at least 3 days a week. Do not use any products that contain nicotine or tobacco. These products include cigarettes, chewing tobacco, and vaping devices, such as e-cigarettes. If you need help quitting, ask your health care provider. Monitor your blood pressure at home as told by your health care provider. Keep all follow-up visits. This is important. Medicines Take over-the-counter and prescription medicines only as told by your health care provider. Follow directions carefully. Blood  pressure medicines must be taken as prescribed. Do not skip doses of blood pressure medicine. Doing this puts you at risk for problems and can make the medicine less effective. Ask your health care provider about side effects or reactions to medicines that you should watch for. Contact a health care provider if you: Think you are having a reaction to a medicine you are taking. Have headaches that keep coming back (recurring). Feel dizzy. Have swelling in your ankles. Have trouble with your vision. Get help right away if you: Develop a severe headache or confusion. Have unusual weakness or numbness. Feel faint. Have severe pain in your chest or abdomen. Vomit repeatedly. Have trouble breathing. These symptoms may be an emergency. Get help right away. Call 911. Do not wait to see if the symptoms will go away. Do not drive yourself to the hospital. Summary Hypertension is when the force of blood pumping through your arteries is too strong. If this condition is not controlled, it may put you at risk for serious complications. Your personal target blood pressure may vary depending on your medical conditions, your age, and other factors. For most people, a normal blood pressure is less than 120/80. Hypertension is treated with lifestyle changes, medicines, or a combination of both. Lifestyle changes include losing weight, eating a healthy,   low-sodium diet, exercising more, and limiting alcohol. This information is not intended to replace advice given to you by your health care provider. Make sure you discuss any questions you have with your health care provider. Document Revised: 04/25/2021 Document Reviewed: 04/25/2021 Elsevier Patient Education  2024 [employer]  

## 2023-06-27 NOTE — Progress Notes (Signed)
Subjective:  Patient ID: Marcus Crawford, male    DOB: 03-22-1951  Age: 72 y.o. MRN: [id number]  CC: Hypertension   HPI HAWKEYE PINE presents for f/up ---  Discussed the use of AI scribe software for clinical note transcription with the patient, who gave verbal consent to proceed.  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 He has been supplementing with B12 for approximately two weeks. He reports no changes in his neurological symptoms, which remain unexplained.  The patient has been on Duloxetine for over a year, initially prescribed for depression, but he does not believe he is depressed. He also takes Quetiapine for insomnia. He reports some improvement in his condition since starting Duloxetine, but not significantly. He would like to taper off duloxetine       Outpatient Medications Prior to Visit  Medication Sig Dispense Refill   cholecalciferol (VITAMIN D) 1000 UNITS tablet Take 2,000 Units by mouth daily. Taking 2000 units daily     indapamide (LOZOL) 1.25 MG tablet Take 1 tablet (1.25 mg total) by mouth daily. 90 tablet 0   levocetirizine (XYZAL) 5 MG tablet TAKE ONE TABLET BY MOUTH EVERY EVENING 90 tablet 1   olmesartan (BENICAR) 20 MG tablet Take 1 tablet (20 mg total) by mouth daily. 90 tablet 0   Polyvinyl Alcohol-Povidone (REFRESH OP) Place 1 drop into both eyes 4 (four) times daily as needed (dryness).     pravastatin (PRAVACHOL) 10 MG tablet Take 1 tablet (10 mg total) by mouth daily. 90 tablet 0   QUEtiapine (SEROQUEL) 25 MG tablet Take 25 mg by mouth at bedtime.     RABEprazole (ACIPHEX) 20 MG tablet TAKE ONE TABLET BY MOUTH ONE TIME DAILY 90 tablet 1   tadalafil (CIALIS) 20 MG tablet Take 20 mg by mouth daily as needed for erectile dysfunction.     traZODone (DESYREL) 50 MG tablet TAKE 1 TABLET(50 MG) BY MOUTH AT BEDTIME 90 tablet 0    DULoxetine (CYMBALTA) 60 MG capsule Take 1 capsule (60 mg total) by mouth daily. 90 capsule 0   No facility-administered medications prior to visit.    ROS Review of Systems  Constitutional:  Positive for fatigue. Negative for appetite change, chills and diaphoresis.  HENT: Negative.    Respiratory:  Positive for apnea. Negative for cough, choking, shortness of breath and wheezing.   Cardiovascular:  Negative for chest pain, palpitations and leg swelling.  Gastrointestinal:  Negative for abdominal pain, constipation, diarrhea, nausea and vomiting.  Genitourinary: Negative.  Negative for difficulty urinating, dysuria and hematuria.  Musculoskeletal:  Positive for back pain. Negative for myalgias.  Skin: Negative.  Negative for color change, pallor and rash.  Neurological:  Positive for tremors. Negative for dizziness, weakness and light-headedness.  Hematological:  Negative for adenopathy. Does not bruise/bleed easily.  Psychiatric/Behavioral:  Positive for confusion, decreased concentration, dysphoric mood and sleep disturbance. Negative for self-injury and suicidal ideas. The patient is not nervous/anxious.     Objective:  BP 138/88 (BP Location: Left Arm, Patient Position: Sitting, Cuff Size: Normal)   Pulse 87   Temp 98.1 F (36.7 C) (Oral)   Resp 16   Ht 6\' 1"  (1.854 m)   Wt 203 lb 9.6 oz (92.4 kg)   SpO2 96%   BMI 26.86 kg/m   BP Readings from Last 3 Encounters:  06/27/23 138/88  06/06/23 (!) 158/90  05/15/23 (!) 162/102    Wt Readings from Last 3 Encounters:  06/27/23 203 lb 9.6 oz (92.4 kg)  06/06/23 205 lb (93 kg)  05/15/23 203 lb 12.8 oz (92.4 kg)    Physical Exam Vitals reviewed.  Constitutional:      Appearance: Normal appearance.  HENT:     Nose: Nose normal.     Mouth/Throat:     Mouth: Mucous membranes are moist.  Eyes:     General: No scleral icterus.    Conjunctiva/sclera: Conjunctivae normal.  Cardiovascular:     Rate and Rhythm: Normal rate  and regular rhythm.     Heart sounds: No murmur heard.    No friction rub. No gallop.  Pulmonary:     Effort: Pulmonary effort is normal.     Breath sounds: No stridor. No wheezing, rhonchi or rales.  Abdominal:     General: Abdomen is flat.     Palpations: There is no mass.     Tenderness: There is no abdominal tenderness. There is no guarding.     Hernia: No hernia is present.  Musculoskeletal:        General: Normal range of motion.     Cervical back: Normal range of motion and neck supple.     Right lower leg: No edema.     Left lower leg: No edema.  Lymphadenopathy:     Cervical: No cervical adenopathy.  Skin:    General: Skin is warm and dry.     Findings: No rash.  Neurological:     General: No focal deficit present.     Mental Status: He is alert. Mental status is at baseline.     Motor: Tremor present.     Comments: Right facial twitch  Psychiatric:        Mood and Affect: Mood normal.        Behavior: Behavior normal.     Lab Results  Component Value Date   WBC 5.2 02/06/2023   HGB 13.9 02/06/2023   HCT 43.2 02/06/2023   PLT 167.0 02/06/2023   GLUCOSE 118 (H) 02/06/2023   CHOL 141 11/06/2022   TRIG 228.0 (H) 11/06/2022   HDL 38.40 (L) 11/06/2022   LDLDIRECT 79.0 11/06/2022   LDLCALC 115 (H) 01/26/2020   ALT 17 02/06/2023   AST 8 02/06/2023   NA 139 02/06/2023   K 4.8 02/06/2023   CL 104 02/06/2023   CREATININE 1.49 02/06/2023   BUN 24 (H) 02/06/2023   CO2 30 02/06/2023   TSH 2.11 11/06/2022   PSA 0.00 Repeated and verified X2. (L) 02/06/2023   INR 1.12 02/28/2012   HGBA1C 5.9 11/06/2022   MICROALBUR 0.4 10/14/2008    No results found.   Assessment & Plan:   Stage 3a chronic kidney disease (HCC) - Will avoid nephrotoxic agents  -     Urinalysis, Routine w reflex microscopic; Future  Essential hypertension - His BP is adequately well controlled. -     Urinalysis, Routine w reflex microscopic; Future  Daytime somnolence -     Modafinil;  Take 1 tablet (100 mg total) by mouth daily.  Dispense: 90 tablet; Refill: 0  OSA (obstructive sleep apnea) -     Modafinil; Take 1 tablet (100 mg total) by mouth daily.  Dispense: 90 tablet; Refill: 0  Current severe episode of major depressive disorder without psychotic features without prior episode (HCC) - Will start tapering the SNRI. -     DULoxetine HCl; Take 1 capsule (30  mg total) by mouth daily.  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  Sanda Linger, MD

## 2023-07-04 ENCOUNTER — Telehealth: Payer: Self-pay | Admitting: Physical Medicine and Rehabilitation

## 2023-07-04 NOTE — Telephone Encounter (Signed)
 Patient called and needs to schedule for MRI follow up. CB#[phone]

## 2023-07-05 ENCOUNTER — Other Ambulatory Visit (HOSPITAL_COMMUNITY): Payer: Self-pay

## 2023-07-05 ENCOUNTER — Telehealth: Payer: Self-pay | Admitting: Pharmacy Technician

## 2023-07-05 NOTE — Telephone Encounter (Signed)
 Pharmacy Patient Advocate Encounter   Received notification from CoverMyMeds that prior authorization for Modafinil  100MG  tablets is required/requested.   Insurance verification completed.   The patient is insured through [facility] .   Per test claim: PA required; PA submitted to above mentioned insurance via CoverMyMeds Key/confirmation #/EOC Key: [id number] Status is pending

## 2023-07-08 ENCOUNTER — Other Ambulatory Visit (HOSPITAL_COMMUNITY): Payer: Self-pay

## 2023-07-08 NOTE — Telephone Encounter (Signed)
 Pharmacy Patient Advocate Encounter  Received notification from [facility] that Prior Authorization for Modafinil  100 mg tablets has been APPROVED from 07/05/23 to 07/04/97. Unable to obtain price due to refill too soon rejection, last fill date 07/06/23 next available fill date01/26/25   PA #/Case ID/Reference #: [id number]

## 2023-07-09 ENCOUNTER — Encounter: Payer: Self-pay | Admitting: Neurology

## 2023-07-09 ENCOUNTER — Encounter: Payer: Self-pay | Admitting: Physical Medicine and Rehabilitation

## 2023-07-09 ENCOUNTER — Ambulatory Visit: Payer: Medicare Other | Admitting: Physical Medicine and Rehabilitation

## 2023-07-09 DIAGNOSIS — G8929 Other chronic pain: Secondary | ICD-10-CM

## 2023-07-09 DIAGNOSIS — M546 Pain in thoracic spine: Secondary | ICD-10-CM

## 2023-07-09 DIAGNOSIS — R937 Abnormal findings on diagnostic imaging of other parts of musculoskeletal system: Secondary | ICD-10-CM | POA: Diagnosis not present

## 2023-07-09 NOTE — Progress Notes (Signed)
 Marcus Crawford - 73 y.o. male MRN [id number]  Date of birth: Aug 01, 1950  Office Visit Note: Visit Date: 07/09/2023 PCP: Joshua Debby CROME, MD Referred by: Joshua Debby CROME, MD  Subjective: Chief Complaint  Patient presents with   Middle Back - Pain   HPI: Marcus Crawford is a 73 y.o. male who comes in today for evaluation of chronic, worsening and severe bilateral thoracic back pain. Pain ongoing for several years, worsens with movement and activity. He describes pain as sore and aching sensation, pain radiates across entire middle back. States his pain is so severe that he is unable to function, currently rates as 9 out of 10. His pain does get some better with rest and sitting.Thoracic MRI from September of 2024 did show concerning findings of indeterminate osseous lesions within the T6 and T12 vertebrae. 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 There is multi level mild disc bulging and facet arthropathy, No central canal stenosis or foraminal stenosis noted. Patient denies focal weakness, numbness and tingling. No recent trauma or falls. He is currently helping to care for his wife whom is undergoing treatments for bone cancer.   Of note, patient is taking Cymbalta  for more depression, he is working with his primary care provider Dr. Joshua to taper off this medication as he does not feel it was beneficial for him. States he feels his overall being and chronic thoracic back pain has improved since starting Cymbalta  taper in December.     Review of Systems  Musculoskeletal:  Positive for back pain.  Neurological:  Negative for tingling, sensory change, focal weakness and weakness.  All other systems reviewed and are negative.  Otherwise per HPI.  Assessment & Plan: Visit Diagnoses:    ICD-10-CM   1. Chronic bilateral thoracic  back pain  M54.6    G89.29     2. Abnormal MRI, thoracic spine  R93.7        Plan: Findings:  Chronic, worsening and severe bilateral thoracic back pain.  Patient continues to have severe pain despite good conservative therapy such as home exercise regimen, rest and use of medications. Patients clinical presentation and exam are complex. From a structural standpoint, recent thoracic MRI imaging does not show nerve impingement or central canal stenosis, there is mild multi level facet arthropathy. 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 His symptoms do not directly correlate with thoracic MRI imaging. I do feel there could be an underlying pain/central sensitization syndrome contributing to his symptoms. We discussed treatment plan in detail today, recommend patient undergo short course of formal physical therapy, he would prefer to have PT come to his come so that he can continue to take his wife to her appointments. I will work with Tylene Debby, RN our case manager to help arrange at home PT. Should his pain continue would recommend referral to more of comprehensive/tertiary pain management such as [facility], [facility] or [facility]. Patient has no questions at this time. No red flag symptoms noted upon exam today.     Meds & Orders: No orders of the defined types were placed in this encounter.  No orders of the defined types were placed in this encounter.   Follow-up: Return if symptoms worsen or fail to improve.   Procedures: No procedures performed  Clinical History: CLINICAL DATA:  Chronic thoracic spine pain, worse over the past year. No known injury.   EXAM: MRI THORACIC SPINE WITHOUT CONTRAST   TECHNIQUE: Multiplanar, multisequence MR imaging of the thoracic spine was performed. No intravenous contrast was administered.   COMPARISON:  MRI thoracic spine 03/23/2023.   FINDINGS: Alignment: Normal. Exaggeration of the normal thoracic  kyphosis is again seen.   Vertebrae: No fracture or evidence of discitis. A few small Schmorl's nodes are identified, most prominent at the T7-8 level. 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 Also again seen is a predominantly T2 hyperintense lesion in the right T6 pedicle which is unchanged in appearance. Punctate, predominantly T2 hyperintense lesion in T12 is also unchanged. 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   Cord:  Normal signal throughout.   Paraspinal and other soft tissues: Negative.   Disc levels:   Very mild multilevel disc bulging and scattered facet degenerative disease again seen. The central canal and foramina are open at all levels.   IMPRESSION: 1. No change in the appearance of the thoracic spine since the prior exam. Mild thoracic spondylosis without central canal or foraminal narrowing. 2. Stable appearance of a T2 hyperintense lesion in the right T6 pedicle and a punctate T2 hyperintense lesion in T12. 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 No follow-up imaging is recommend.     Electronically Signed   By: Debby Prader M.D.   On: 07/08/2023 10:03   He reports that he has never smoked. He has never used smokeless tobacco.  Recent Labs    11/06/22 1338  HGBA1C 5.9    Objective:  VS:  HT:    WT:   BMI:     BP:   HR: bpm  TEMP: ( )  RESP:  Physical Exam Vitals and nursing note reviewed.  HENT:     Head: Normocephalic and atraumatic.     Right Ear: External ear normal.     Left Ear: External ear normal.     Nose: Nose normal.     Mouth/Throat:     Mouth: Mucous membranes are moist.  Eyes:     Extraocular Movements: Extraocular movements intact.  Cardiovascular:     Rate and Rhythm: Normal rate.     Pulses: Normal pulses.  Pulmonary:     Effort: Pulmonary effort is normal.  Abdominal:      General: Abdomen is flat. There is no distension.  Musculoskeletal:        General: Tenderness present.     Cervical back: Normal range of motion.     Comments: Normal thoracic kyphosis noted. No tenderness noted upon palpation of spinous processes. No step off deformity. No pain noted with flexion, extension, lateral bending and rotation. Good strength noted to bilateral upper extremities.    Skin:    General: Skin is warm and dry.     Capillary Refill: Capillary refill takes less than 2 seconds.  Neurological:     General: No focal deficit present.     Mental Status: He is alert and oriented to person, place, and time.  Psychiatric:        Mood and Affect: Mood normal.        Behavior: Behavior normal.     Ortho Exam  Imaging: No results found.  Past Medical/Family/Surgical/Social History: Medications & Allergies reviewed per EMR, new  medications updated. 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 06/27/2023   OSA (obstructive sleep apnea) 02/12/2023   Chronic bilateral low back pain without sciatica 02/06/2023   Coarse tremors 11/06/2022   Hyperlipidemia with target LDL less than 100 08/08/2022   Current severe episode of major depressive disorder without psychotic features without prior episode (HCC) 04/18/2022   RBBB (right bundle branch block) 01/30/2021   Seasonal allergic rhinitis due to pollen 01/15/2018   Obesity, Class I, BMI 30-34.9 01/12/2016   Kidney disease, chronic, stage III (GFR 30-59 ml/min) (HCC) 01/12/2016   Prostate cancer (HCC) 07/30/2013   Vitamin D  deficiency 06/15/2013   Essential hypertension 09/15/2007   Esophageal reflux 12/06/2006   History of colonic polyps 12/06/2006   Past Medical History:  Diagnosis Date   Anemia    early 20's   Bell's palsy 1977   Blood clot in vein 2005   left leg blood clot-treated as OP; no residual   Blood  transfusion without reported diagnosis    with appendectomy   Borderline diabetic    no meds   Cancer (HCC) 08/2013   prostate/ had surgery/ no chemo or radiation   Cataract    forming   CKD (chronic kidney disease) stage 3, GFR 30-59 ml/min (HCC) saw dr douglass 6 months ago   on rampiril for kidney function also   Colonic polyp    X3 ,hyperplastic   Diverticulosis of colon    GERD (gastroesophageal reflux disease)    H/O hiatal hernia    Hemorrhoid    Hyperlipidemia    Hypertension    past hx - no meds since 2013 with  adrenal gland surgery   Kidney stone on left side 2013   asymptomatic , incidental finding   Migraine last 6-7 yrs ago   PMH of ; resolved post adrenal adenoma resection   Nephrolithiasis 2013   kidney stone  as incidental finding on imaging   PONV (postoperative nausea and vomiting)    Family History  Problem Relation Age of Onset   Rectal cancer Mother    Colon cancer Mother 10   Heart attack Mother 15   Colon polyps Sister    Lung cancer Sister        smoker   Diabetes Sister    Breast cancer Sister    Alzheimer's disease Sister    Heart failure Brother    Kidney cancer Brother    Prostate cancer Brother 2   Heart disease Brother    Hypertension Brother    Kidney cancer Brother    Diabetes Brother    Heart attack Brother 71   Esophageal cancer Maternal Uncle    Kidney cancer Maternal Uncle    Stomach cancer Neg Hx    Past Surgical History:  Procedure Laterality Date   adrenal venous sampling  03-2012   ADRENALECTOMY  04/14/2012   Procedure: ADRENALECTOMY;  Surgeon: Lynda Leos, MD;  Location: [facility];  Service: General;  Laterality: Left;  Left Adrenal Excision   APPENDECTOMY  1985   COLONOSCOPY     last 9/12, Dr Aneita; due 2017   ESOPHAGEAL DILATION  2001   Dr Cloretta   POLYPECTOMY      X 3   ROBOT ASSISTED LAPAROSCOPIC RADICAL PROSTATECTOMY N/A 09/07/2013   Procedure: ROBOTIC ASSISTED LAPAROSCOPIC RADICAL PROSTATECTOMY LEVEL 1;   Surgeon: Noretta Ferrara, MD;  Location: [facility];  Service: Urology;  Laterality: N/A;   Social History   Occupational History   Occupation: retired  Tobacco Use   Smoking status: Never   Smokeless tobacco: Never  Vaping Use   Vaping status: Never Used  Substance and Sexual Activity   Alcohol use: Not Currently   Drug use: No   Sexual activity: Yes

## 2023-07-11 ENCOUNTER — Telehealth: Payer: Self-pay | Admitting: Physical Medicine and Rehabilitation

## 2023-07-11 DIAGNOSIS — M546 Pain in thoracic spine: Secondary | ICD-10-CM | POA: Diagnosis not present

## 2023-07-11 DIAGNOSIS — G8929 Other chronic pain: Secondary | ICD-10-CM | POA: Diagnosis not present

## 2023-07-11 DIAGNOSIS — R251 Tremor, unspecified: Secondary | ICD-10-CM | POA: Diagnosis not present

## 2023-07-11 DIAGNOSIS — R937 Abnormal findings on diagnostic imaging of other parts of musculoskeletal system: Secondary | ICD-10-CM | POA: Diagnosis not present

## 2023-07-11 DIAGNOSIS — D511 Vitamin B12 deficiency anemia due to selective vitamin B12 malabsorption with proteinuria: Secondary | ICD-10-CM | POA: Diagnosis not present

## 2023-07-11 DIAGNOSIS — Z9989 Dependence on other enabling machines and devices: Secondary | ICD-10-CM | POA: Diagnosis not present

## 2023-07-11 DIAGNOSIS — E66811 Obesity, class 1: Secondary | ICD-10-CM | POA: Diagnosis not present

## 2023-07-11 DIAGNOSIS — E785 Hyperlipidemia, unspecified: Secondary | ICD-10-CM | POA: Diagnosis not present

## 2023-07-11 DIAGNOSIS — Z8546 Personal history of malignant neoplasm of prostate: Secondary | ICD-10-CM | POA: Diagnosis not present

## 2023-07-11 DIAGNOSIS — E559 Vitamin D deficiency, unspecified: Secondary | ICD-10-CM | POA: Diagnosis not present

## 2023-07-11 DIAGNOSIS — I129 Hypertensive chronic kidney disease with stage 1 through stage 4 chronic kidney disease, or unspecified chronic kidney disease: Secondary | ICD-10-CM | POA: Diagnosis not present

## 2023-07-11 DIAGNOSIS — F322 Major depressive disorder, single episode, severe without psychotic features: Secondary | ICD-10-CM | POA: Diagnosis not present

## 2023-07-11 DIAGNOSIS — Z6827 Body mass index (BMI) 27.0-27.9, adult: Secondary | ICD-10-CM | POA: Diagnosis not present

## 2023-07-11 DIAGNOSIS — N183 Chronic kidney disease, stage 3 unspecified: Secondary | ICD-10-CM | POA: Diagnosis not present

## 2023-07-11 DIAGNOSIS — I4510 Unspecified right bundle-branch block: Secondary | ICD-10-CM | POA: Diagnosis not present

## 2023-07-11 DIAGNOSIS — K219 Gastro-esophageal reflux disease without esophagitis: Secondary | ICD-10-CM | POA: Diagnosis not present

## 2023-07-11 DIAGNOSIS — G4733 Obstructive sleep apnea (adult) (pediatric): Secondary | ICD-10-CM | POA: Diagnosis not present

## 2023-07-11 NOTE — Telephone Encounter (Signed)
 Besty from [facility] called and requesting verbal orders. 2 wk 3, and 1 wk 1. CB#[phone]

## 2023-07-15 DIAGNOSIS — G4733 Obstructive sleep apnea (adult) (pediatric): Secondary | ICD-10-CM | POA: Diagnosis not present

## 2023-07-15 DIAGNOSIS — I129 Hypertensive chronic kidney disease with stage 1 through stage 4 chronic kidney disease, or unspecified chronic kidney disease: Secondary | ICD-10-CM | POA: Diagnosis not present

## 2023-07-15 DIAGNOSIS — G8929 Other chronic pain: Secondary | ICD-10-CM | POA: Diagnosis not present

## 2023-07-15 DIAGNOSIS — M546 Pain in thoracic spine: Secondary | ICD-10-CM | POA: Diagnosis not present

## 2023-07-15 DIAGNOSIS — F322 Major depressive disorder, single episode, severe without psychotic features: Secondary | ICD-10-CM | POA: Diagnosis not present

## 2023-07-15 DIAGNOSIS — N183 Chronic kidney disease, stage 3 unspecified: Secondary | ICD-10-CM | POA: Diagnosis not present

## 2023-07-17 ENCOUNTER — Other Ambulatory Visit: Payer: Self-pay | Admitting: Internal Medicine

## 2023-07-17 ENCOUNTER — Telehealth: Payer: Self-pay | Admitting: Internal Medicine

## 2023-07-17 DIAGNOSIS — F322 Major depressive disorder, single episode, severe without psychotic features: Secondary | ICD-10-CM | POA: Diagnosis not present

## 2023-07-17 DIAGNOSIS — N183 Chronic kidney disease, stage 3 unspecified: Secondary | ICD-10-CM | POA: Diagnosis not present

## 2023-07-17 DIAGNOSIS — G4733 Obstructive sleep apnea (adult) (pediatric): Secondary | ICD-10-CM | POA: Diagnosis not present

## 2023-07-17 DIAGNOSIS — G8929 Other chronic pain: Secondary | ICD-10-CM | POA: Diagnosis not present

## 2023-07-17 DIAGNOSIS — M546 Pain in thoracic spine: Secondary | ICD-10-CM | POA: Diagnosis not present

## 2023-07-17 DIAGNOSIS — I129 Hypertensive chronic kidney disease with stage 1 through stage 4 chronic kidney disease, or unspecified chronic kidney disease: Secondary | ICD-10-CM | POA: Diagnosis not present

## 2023-07-17 NOTE — Telephone Encounter (Signed)
 Called patient, he states he is more fatigue, not able to think straight, his voice sounds hoarse, just overall does not feel well, his head feels cloudy. He feels nervous while taking it. Would like us  to send him a MyChart message regarding next steps.

## 2023-07-17 NOTE — Telephone Encounter (Signed)
Copied from CRM [id number]. Topic: Clinical - Medical Advice >> Jul 17, 2023 12:48 PM Alona Bene A wrote: Reason for CRM: Patient called in to see if he can stop taking modafinil (PROVIGIL) 100 MG tablet. Patient states he has been on it for 13 days now and he feels worse than before. Please contact patient to advise if he can stop taking medication. Patient can be reached at [phone]. Patient will not be home between 2-3:30pm.

## 2023-07-18 NOTE — Telephone Encounter (Signed)
MyChart message sent to patient with an update as he requested

## 2023-07-19 ENCOUNTER — Other Ambulatory Visit: Payer: Self-pay | Admitting: Internal Medicine

## 2023-07-23 DIAGNOSIS — I129 Hypertensive chronic kidney disease with stage 1 through stage 4 chronic kidney disease, or unspecified chronic kidney disease: Secondary | ICD-10-CM | POA: Diagnosis not present

## 2023-07-23 DIAGNOSIS — N183 Chronic kidney disease, stage 3 unspecified: Secondary | ICD-10-CM | POA: Diagnosis not present

## 2023-07-23 DIAGNOSIS — M546 Pain in thoracic spine: Secondary | ICD-10-CM | POA: Diagnosis not present

## 2023-07-23 DIAGNOSIS — G4733 Obstructive sleep apnea (adult) (pediatric): Secondary | ICD-10-CM | POA: Diagnosis not present

## 2023-07-23 DIAGNOSIS — G8929 Other chronic pain: Secondary | ICD-10-CM | POA: Diagnosis not present

## 2023-07-23 DIAGNOSIS — F322 Major depressive disorder, single episode, severe without psychotic features: Secondary | ICD-10-CM | POA: Diagnosis not present

## 2023-07-26 DIAGNOSIS — M546 Pain in thoracic spine: Secondary | ICD-10-CM | POA: Diagnosis not present

## 2023-07-26 DIAGNOSIS — N183 Chronic kidney disease, stage 3 unspecified: Secondary | ICD-10-CM | POA: Diagnosis not present

## 2023-07-26 DIAGNOSIS — F322 Major depressive disorder, single episode, severe without psychotic features: Secondary | ICD-10-CM | POA: Diagnosis not present

## 2023-07-26 DIAGNOSIS — G8929 Other chronic pain: Secondary | ICD-10-CM | POA: Diagnosis not present

## 2023-07-26 DIAGNOSIS — I129 Hypertensive chronic kidney disease with stage 1 through stage 4 chronic kidney disease, or unspecified chronic kidney disease: Secondary | ICD-10-CM | POA: Diagnosis not present

## 2023-07-26 DIAGNOSIS — G4733 Obstructive sleep apnea (adult) (pediatric): Secondary | ICD-10-CM | POA: Diagnosis not present

## 2023-07-29 ENCOUNTER — Telehealth: Payer: Self-pay | Admitting: Internal Medicine

## 2023-07-29 ENCOUNTER — Telehealth: Payer: Self-pay | Admitting: Physical Medicine and Rehabilitation

## 2023-07-29 ENCOUNTER — Telehealth: Payer: Self-pay | Admitting: Family Medicine

## 2023-07-29 DIAGNOSIS — N183 Chronic kidney disease, stage 3 unspecified: Secondary | ICD-10-CM | POA: Diagnosis not present

## 2023-07-29 DIAGNOSIS — I129 Hypertensive chronic kidney disease with stage 1 through stage 4 chronic kidney disease, or unspecified chronic kidney disease: Secondary | ICD-10-CM | POA: Diagnosis not present

## 2023-07-29 DIAGNOSIS — G8929 Other chronic pain: Secondary | ICD-10-CM | POA: Diagnosis not present

## 2023-07-29 DIAGNOSIS — F322 Major depressive disorder, single episode, severe without psychotic features: Secondary | ICD-10-CM | POA: Diagnosis not present

## 2023-07-29 DIAGNOSIS — G4733 Obstructive sleep apnea (adult) (pediatric): Secondary | ICD-10-CM | POA: Diagnosis not present

## 2023-07-29 DIAGNOSIS — M546 Pain in thoracic spine: Secondary | ICD-10-CM | POA: Diagnosis not present

## 2023-07-29 NOTE — Telephone Encounter (Signed)
Besty called. She is the PT working in home with Pt. Would like verbal orders 1wk 1x. [phone]

## 2023-07-29 NOTE — Telephone Encounter (Signed)
PATIENT WOULD LIKE A CALL BACK REGARDING A EMAIL HE RECEIVED TELLING HIM TO CALL IN ABOUT HIS APPT I DIDN'T SEE ANY NOTES ABOUT A APPT PATIENT WOULD LIKE A CALL BACK

## 2023-07-29 NOTE — Telephone Encounter (Signed)
Marcus Crawford can you look at this?

## 2023-07-29 NOTE — Telephone Encounter (Signed)
Patient

## 2023-07-30 NOTE — Telephone Encounter (Signed)
Spoke with pt, I just needed to know why he needed to be seen for this appt.

## 2023-08-08 ENCOUNTER — Other Ambulatory Visit: Payer: Self-pay

## 2023-08-08 ENCOUNTER — Ambulatory Visit (HOSPITAL_COMMUNITY): Payer: Medicare Other | Admitting: Psychiatry

## 2023-08-08 ENCOUNTER — Encounter (HOSPITAL_COMMUNITY): Payer: Self-pay | Admitting: Psychiatry

## 2023-08-08 VITALS — BP 113/77 | HR 80 | Ht 73.0 in | Wt 204.0 lb

## 2023-08-08 DIAGNOSIS — R5383 Other fatigue: Secondary | ICD-10-CM | POA: Diagnosis not present

## 2023-08-08 DIAGNOSIS — F419 Anxiety disorder, unspecified: Secondary | ICD-10-CM | POA: Diagnosis not present

## 2023-08-08 NOTE — Progress Notes (Signed)
 [facility] MD/PA/NP OP Progress Note  Patient location; office Provider location; office  08/08/2023 2:17 PM Marcus Crawford  MRN:  [id number]  Chief Complaint:  Chief Complaint  Patient presents with   Follow-up   HPI:  Patient came for his follow-up appointment.  He reported cut down the Cymbalta  30 mg.  He is no longer taking Concerta  because of worsening of high blood pressure.  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  He continues to struggle with fatigue and energy but overall better than before.  He denies any suicidal thoughts, depression, crying spells or any feeling of hopelessness or worthlessness.  He had good holidays.  He visited his sister in [place]  but did not go to [place]  to visit her other sister.  He admitted sometimes struggle with lack of energy but more physical.  He denies any paranoia.  He did talk to his primary care about coming off from psychotropic medication who is prescribing all the psychotropic medication.  Now he like to cut down the Cymbalta  from 30 mg to 20 mg.  He is taking 30 mg for past 10 days.  He would like to know what he should do in the future.  We have recommended to cut down the quetiapine  because of fatigue and he tried for few days but he could not sleep and he went back to 25 mg.  His appetite is okay.  He uses CPAP.  He has no tremor or shakes or any EPS.  He enjoys watching television, goes to grocery stores and he has good social network.  He like to keep himself busy.  His appetite is okay.     ICD-10-CM   1. Fatigue, unspecified type  R53.83     2. Anxiety  F41.9        Past Psychiatric History: No history of suicidal attempt, mania, psychosis, delusion, PTSD or inpatient services.  Started taking antidepressant last year from primary care.  He was given Seroquel , Cymbalta  and Concerta .  Unexplained fatigue after the episode while doing the lawn more.  Had a few ER visit for extreme  fatigue.  Seroquel  was discontinued by this clinical research associate.  Past Medical History:  Past Medical History:  Diagnosis Date   Anemia    early 20's   Bell's palsy 1977   Blood clot in vein 2005   left leg blood clot-treated as OP; no residual   Blood transfusion without reported diagnosis    with appendectomy   Borderline diabetic    no meds   Cancer (HCC) 08/2013   prostate/ had surgery/ no chemo or radiation   Cataract    forming   CKD (chronic kidney disease) stage 3, GFR 30-59 ml/min (HCC) saw dr douglass 6 months ago   on rampiril for kidney function also   Colonic polyp    X3 ,hyperplastic   Diverticulosis of colon    GERD (gastroesophageal reflux disease)    H/O hiatal hernia    Hemorrhoid    Hyperlipidemia    Hypertension    past hx - no meds since 2013 with  adrenal gland surgery   Kidney stone on left side 2013   asymptomatic , incidental finding   Migraine last 6-7 yrs ago   PMH of ; resolved post adrenal adenoma resection   Nephrolithiasis 2013   kidney stone  as incidental finding on imaging   PONV (postoperative nausea and vomiting)     Past  Surgical History:  Procedure Laterality Date   adrenal venous sampling  03-2012   ADRENALECTOMY  04/14/2012   Procedure: ADRENALECTOMY;  Surgeon: Lynda Leos, MD;  Location: [facility];  Service: General;  Laterality: Left;  Left Adrenal Excision   APPENDECTOMY  1985   COLONOSCOPY     last 9/12, Dr Aneita; due 2017   ESOPHAGEAL DILATION  2001   Dr Cloretta   POLYPECTOMY      X 3   ROBOT ASSISTED LAPAROSCOPIC RADICAL PROSTATECTOMY N/A 09/07/2013   Procedure: ROBOTIC ASSISTED LAPAROSCOPIC RADICAL PROSTATECTOMY LEVEL 1;  Surgeon: Noretta Ferrara, MD;  Location: [facility];  Service: Urology;  Laterality: N/A;    Family Psychiatric History: Reviewed  Family History:  Family History  Problem Relation Age of Onset   Rectal cancer Mother    Colon cancer Mother 62   Heart attack Mother 5   Colon polyps Sister    Lung cancer Sister         smoker   Diabetes Sister    Breast cancer Sister    Alzheimer's disease Sister    Heart failure Brother    Kidney cancer Brother    Prostate cancer Brother 42   Heart disease Brother    Hypertension Brother    Kidney cancer Brother    Diabetes Brother    Heart attack Brother 33   Esophageal cancer Maternal Uncle    Kidney cancer Maternal Uncle    Stomach cancer Neg Hx     Social History:  Social History   Socioeconomic History   Marital status: Married    Spouse name: Adrien   Number of children: 0   Years of education: Not on file   Highest education level: Some college, no degree  Occupational History   Occupation: retired  Tobacco Use   Smoking status: Never   Smokeless tobacco: Never  Vaping Use   Vaping status: Never Used  Substance and Sexual Activity   Alcohol use: Not Currently   Drug use: No   Sexual activity: Yes  Other Topics Concern   Not on file  Social History Narrative   Right handed        Are you currently employed ?    What is your current occupation?retired   Do you live at home alone?   Who lives with you?    What type of home do you live in: 1 story or 2 story?    Caffiene  1 daily   Social Drivers of Corporate Investment Banker Strain: Low Risk  (06/25/2023)   Overall Financial Resource Strain (CARDIA)    Difficulty of Paying Living Expenses: Not hard at all  Food Insecurity: No Food Insecurity (06/25/2023)   Hunger Vital Sign    Worried About Running Out of Food in the Last Year: Never true    Ran Out of Food in the Last Year: Never true  Transportation Needs: No Transportation Needs (05/11/2023)   PRAPARE - Administrator, Civil Service (Medical): No    Lack of Transportation (Non-Medical): No  Physical Activity: Insufficiently Active (06/25/2023)   Exercise Vital Sign    Days of Exercise per Week: 1 day    Minutes of Exercise per Session: 10 min  Stress: Stress Concern Present (05/11/2023)   [employer] of  Occupational Health - Occupational Stress Questionnaire    Feeling of Stress : To some extent  Social Connections: Moderately Integrated (06/25/2023)   Social Connection and Isolation Panel [NHANES]  Frequency of Communication with Friends and Family: More than three times a week    Frequency of Social Gatherings with Friends and Family: Twice a week    Attends Religious Services: Never    Database Administrator or Organizations: Yes    Attends Banker Meetings: 1 to 4 times per year    Marital Status: Married    Allergies:  Allergies  Allergen Reactions   Penicillins Rash    Rash (RN clarified with pt) No SOB/swelling   Sulfa Antibiotics Rash    Metabolic Disorder Labs: Lab Results  Component Value Date   HGBA1C 5.9 11/06/2022   MPG 117 01/26/2020   Lab Results  Component Value Date   PROLACTIN 5.6 02/20/2022   Lab Results  Component Value Date   CHOL 141 11/06/2022   TRIG 228.0 (H) 11/06/2022   HDL 38.40 (L) 11/06/2022   CHOLHDL 4 11/06/2022   VLDL 45.6 (H) 11/06/2022   LDLCALC 115 (H) 01/26/2020   LDLCALC 53 01/15/2018   Lab Results  Component Value Date   TSH 2.11 11/06/2022   TSH 1.760 05/02/2022    Therapeutic Level Labs: No results found for: LITHIUM No results found for: VALPROATE No results found for: CBMZ  Current Medications: Current Outpatient Medications  Medication Sig Dispense Refill   cholecalciferol (VITAMIN D ) 1000 UNITS tablet Take 2,000 Units by mouth daily. Taking 2000 units daily     DULoxetine  (CYMBALTA ) 30 MG capsule Take 1 capsule (30 mg total) by mouth daily. 90 capsule 0   indapamide  (LOZOL ) 1.25 MG tablet Take 1 tablet (1.25 mg total) by mouth daily. 90 tablet 0   levocetirizine (XYZAL ) 5 MG tablet TAKE ONE TABLET BY MOUTH EVERY EVENING 90 tablet 1   olmesartan  (BENICAR ) 20 MG tablet Take 1 tablet (20 mg total) by mouth daily. 90 tablet 0   Polyvinyl Alcohol-Povidone (REFRESH OP) Place 1 drop into both eyes 4  (four) times daily as needed (dryness).     pravastatin  (PRAVACHOL ) 10 MG tablet Take 1 tablet (10 mg total) by mouth daily. 90 tablet 0   QUEtiapine  (SEROQUEL ) 25 MG tablet TAKE 1 TABLET(25 MG) BY MOUTH AT BEDTIME 90 tablet 0   RABEprazole  (ACIPHEX ) 20 MG tablet TAKE ONE TABLET BY MOUTH ONE TIME DAILY 90 tablet 1   tadalafil (CIALIS) 20 MG tablet Take 20 mg by mouth daily as needed for erectile dysfunction.     traZODone  (DESYREL ) 50 MG tablet TAKE 1 TABLET(50 MG) BY MOUTH AT BEDTIME 90 tablet 0   No current facility-administered medications for this visit.    Psychiatric Specialty Exam: Physical Exam  Review of Systems  Blood pressure 113/77, pulse 80, height 6' 1 (1.854 m), weight 204 lb (92.5 kg).There is no height or weight on file to calculate BMI.  General Appearance: Well Groomed  Eye Contact:  Good  Speech:  Clear and Coherent and Normal Rate  Volume:  Normal  Mood:  Euthymic and tired  Affect:  Congruent  Thought Process:  Goal Directed  Orientation:  Full (Time, Place, and Person)  Thought Content:  WDL  Suicidal Thoughts:  No  Homicidal Thoughts:  No  Memory:  Immediate;   Good Recent;   Good Remote;   Good  Judgement:  Intact  Insight:  Present  Psychomotor Activity:  Normal  Concentration:  Concentration: Good and Attention Span: Good  Recall:  Good  Fund of Knowledge:  Good  Language:  Good  Akathisia:  No  Handed:  Right  AIMS (if indicated):     Assets:  Communication Skills Desire for Improvement Housing Resilience Social Support Talents/Skills Transportation  ADL's:  Intact  Cognition:  WNL  Sleep:   ok   Screenings: GAD-7    Loss Adjuster, Chartered Office Visit from 06/27/2023 in [facility] [place] Office Visit from 03/21/2023 [facility]  Total GAD-7 Score 1 1      PHQ2-9    Flowsheet Row Office Visit from 06/27/2023 in [facility] [place] Office Visit  from 03/21/2023 in [facility] Clinical Support from 01/07/2023 in [facility] [place] Office Visit from 11/06/2022 in [facility] Visit from 08/08/2022 in [facility] [place]  PHQ-2 Total Score 0 0 0 0 0  PHQ-9 Total Score 3 4 5 4 4       Flowsheet Row Office Visit from 03/21/2023 in [facility] ED from 05/02/2022 in [facility] ED from 04/10/2022 in [facility]  C-SSRS RISK CATEGORY No Risk No Risk No Risk        Assessment and Plan:  I reviewed blood work results.  Patient is doing slowly and gradually better from his fatigue but is still there are days when he struggle with motivation.  He like to know what should he do in the future since gradually he had cut down Cymbalta  and Concerta .  He used to take 90 mg but now he is only taking 30 mg for past 10 days.  I recommend he should try 20 mg after 2 weeks of 30 mg.  I also recommend may need in the future to cut down the quetiapine  from 25 mg to 12.5.  I explained 1 region of chronic fatigue could be the viral infection with Epstein-Barr virus.  Patient is also in the process of changing his primary care.  He like to know if his primary care did not give him Cymbalta  20 mg then can he get the prescription from our office.  I given reassurance that we can provide Cymbalta  20 mg if he needed from our office.  We discussed possible withdrawals from stopping abruptly Cymbalta .  He is no longer taking Concerta  due to concern of the high blood pressure.  Encourage to continue his normal routine.  He has no depressive symptoms.  Encouraged to follow-up with neurology if needed.  Follow-up in 3 months  Collaboration of Care: Collaboration of Care: Other provider involved in patient's care AEB I will  forward my note to primary care.  Patient/Guardian was advised Release of Information must be obtained prior to any record release in order to collaborate their care with an outside provider. Patient/Guardian was advised if they have not already done so to contact the registration department to sign all necessary forms in order for us  to release information regarding their care.   Consent: Patient/Guardian gives verbal consent for treatment and assignment of benefits for services provided during this visit. Patient/Guardian expressed understanding and agreed to proceed.   I spent 26 minutes face-to-face time with the patient during this encounter.  Leni ONEIDA Client, MD 08/08/2023, 2:17 PM

## 2023-08-14 ENCOUNTER — Ambulatory Visit: Payer: Self-pay | Admitting: Internal Medicine

## 2023-08-14 NOTE — Telephone Encounter (Signed)
  Chief Complaint: Medication questions Symptoms: decreased energy, occasional dizziness Frequency: Noticed around 07/29/23 when decreasing to 30mg  Pertinent Negatives: Patient denies CP, SOB Disposition: [] ED /[] Urgent Care (no appt availability in office) / [] Appointment(In office/virtual)/ []  [facility]/ [] Home Care/ [] Refused Recommended Disposition /[] [facility]/ [x]  Follow-up with PCP Additional Notes: Patient states he is tapering medication and requesting advice on whether he should increase back to 60 mg. States he feels decreased energy since being on 30 mg. Alerting PCP for review and f/u. States he will be at an appt with his wife from 13-1500 and will be unavailable for callback during that time.  Copied from CRM [id number]. Topic: Clinical - Red Word Triage >> Aug 14, 2023 10:44 AM Shelbie Proctor wrote: Red Word that prompted transfer to Nurse Triage: Patient [phone] was taking DULoxetine (CYMBALTA) 60 MG capsule and started taking 07/29/23 only DULoxetine (CYMBALTA) 30 MG capsule. Patient noticed changed of feeling really weak, dizziness, fatigue, no headaches or shortness of breath. Reason for Disposition  [1] Caller has NON-URGENT medicine question about med that PCP prescribed AND [2] triager unable to answer question  Answer Assessment - Initial Assessment Questions 1. NAME of MEDICINE: "What medicine(s) are you calling about?"     Duloxetine 30 mg as of 07/29/23 2. QUESTION: "What is your question?" (e.g., double dose of medicine, side effect)     Patient reports he has been tapering this medication as advised but now is wondering if he should increase the medication due to decreased energy. 3. PRESCRIBER: "Who prescribed the medicine?" Reason: if prescribed by specialist, call should be referred to that group.     PCP 4. SYMPTOMS: "Do you have any symptoms?" If Yes, ask: "What symptoms are you having?"  "How bad are the symptoms (e.g., mild, moderate,  severe)     Decreased energy, occasional dizziness with movement.  Protocols used: Medication Question Call-A-AH

## 2023-08-15 ENCOUNTER — Other Ambulatory Visit: Payer: Self-pay | Admitting: Internal Medicine

## 2023-08-15 DIAGNOSIS — F5104 Psychophysiologic insomnia: Secondary | ICD-10-CM

## 2023-08-15 DIAGNOSIS — F322 Major depressive disorder, single episode, severe without psychotic features: Secondary | ICD-10-CM

## 2023-08-15 NOTE — Telephone Encounter (Signed)
Please advise

## 2023-08-16 ENCOUNTER — Other Ambulatory Visit: Payer: Self-pay | Admitting: Internal Medicine

## 2023-08-16 DIAGNOSIS — F322 Major depressive disorder, single episode, severe without psychotic features: Secondary | ICD-10-CM

## 2023-08-16 MED ORDER — QUETIAPINE FUMARATE 25 MG PO TABS
25.0000 mg | ORAL_TABLET | Freq: Every day | ORAL | 0 refills | Status: DC
Start: 2023-08-16 — End: 2023-11-07

## 2023-08-19 ENCOUNTER — Encounter: Payer: Medicare Other | Admitting: Family Medicine

## 2023-08-19 ENCOUNTER — Other Ambulatory Visit: Payer: Self-pay | Admitting: Internal Medicine

## 2023-08-19 DIAGNOSIS — I10 Essential (primary) hypertension: Secondary | ICD-10-CM

## 2023-08-20 ENCOUNTER — Other Ambulatory Visit: Payer: Self-pay | Admitting: Internal Medicine

## 2023-08-20 DIAGNOSIS — E785 Hyperlipidemia, unspecified: Secondary | ICD-10-CM

## 2023-08-21 ENCOUNTER — Other Ambulatory Visit: Payer: Self-pay | Admitting: Internal Medicine

## 2023-08-23 ENCOUNTER — Encounter: Payer: Self-pay | Admitting: Internal Medicine

## 2023-08-24 ENCOUNTER — Other Ambulatory Visit: Payer: Self-pay | Admitting: Internal Medicine

## 2023-08-24 DIAGNOSIS — I10 Essential (primary) hypertension: Secondary | ICD-10-CM

## 2023-08-26 ENCOUNTER — Telehealth (HOSPITAL_COMMUNITY): Payer: Self-pay | Admitting: *Deleted

## 2023-08-26 DIAGNOSIS — F322 Major depressive disorder, single episode, severe without psychotic features: Secondary | ICD-10-CM

## 2023-08-26 MED ORDER — DULOXETINE HCL 20 MG PO CPEP: 20.0000 mg | ORAL_CAPSULE | Freq: Every day | ORAL | 2 refills | Status: DC

## 2023-08-26 NOTE — Telephone Encounter (Signed)
 LVM for pt.

## 2023-08-26 NOTE — Telephone Encounter (Signed)
 Pt called requesting a script for Cymbalta 20 mg. Pt and Dr. Yetta Barre were tapering the medication down to d/c however pt started feeling lack of energy and now would like to saty on 20 mg dose every day. Pt says Dr. Yetta Barre referred back to psychiatry. Please review.

## 2023-08-26 NOTE — Telephone Encounter (Signed)
 Send to [facility] Cymbalta 20 mg. Please inform patient.

## 2023-08-27 ENCOUNTER — Other Ambulatory Visit: Payer: Self-pay | Admitting: Internal Medicine

## 2023-08-27 DIAGNOSIS — I10 Essential (primary) hypertension: Secondary | ICD-10-CM

## 2023-08-28 ENCOUNTER — Encounter: Payer: Self-pay | Admitting: Internal Medicine

## 2023-09-26 ENCOUNTER — Other Ambulatory Visit: Payer: Self-pay | Admitting: Internal Medicine

## 2023-09-26 DIAGNOSIS — K2100 Gastro-esophageal reflux disease with esophagitis, without bleeding: Secondary | ICD-10-CM

## 2023-09-30 ENCOUNTER — Ambulatory Visit (INDEPENDENT_AMBULATORY_CARE_PROVIDER_SITE_OTHER): Payer: Medicare Other | Admitting: Internal Medicine

## 2023-09-30 ENCOUNTER — Encounter: Payer: Self-pay | Admitting: Internal Medicine

## 2023-09-30 VITALS — BP 114/78 | HR 78 | Temp 98.3°F | Resp 16 | Ht 73.0 in | Wt 201.0 lb

## 2023-09-30 DIAGNOSIS — N1831 Chronic kidney disease, stage 3a: Secondary | ICD-10-CM

## 2023-09-30 DIAGNOSIS — R739 Hyperglycemia, unspecified: Secondary | ICD-10-CM | POA: Insufficient documentation

## 2023-09-30 DIAGNOSIS — D511 Vitamin B12 deficiency anemia due to selective vitamin B12 malabsorption with proteinuria: Secondary | ICD-10-CM | POA: Diagnosis not present

## 2023-09-30 DIAGNOSIS — E785 Hyperlipidemia, unspecified: Secondary | ICD-10-CM

## 2023-09-30 DIAGNOSIS — I10 Essential (primary) hypertension: Secondary | ICD-10-CM

## 2023-09-30 LAB — CBC WITH DIFFERENTIAL/PLATELET
Basophils Absolute: 0 10*3/uL (ref 0.0–0.1)
Basophils Relative: 0.5 % (ref 0.0–3.0)
Eosinophils Absolute: 0.1 10*3/uL (ref 0.0–0.7)
Eosinophils Relative: 1 % (ref 0.0–5.0)
HCT: 41.4 % (ref 39.0–52.0)
Hemoglobin: 13.9 g/dL (ref 13.0–17.0)
Lymphocytes Relative: 16.6 % (ref 12.0–46.0)
Lymphs Abs: 1.2 10*3/uL (ref 0.7–4.0)
MCHC: 33.6 g/dL (ref 30.0–36.0)
MCV: 92.3 fl (ref 78.0–100.0)
Monocytes Absolute: 0.5 10*3/uL (ref 0.1–1.0)
Monocytes Relative: 7.4 % (ref 3.0–12.0)
Neutro Abs: 5.3 10*3/uL (ref 1.4–7.7)
Neutrophils Relative %: 74.5 % (ref 43.0–77.0)
Platelets: 180 10*3/uL (ref 150.0–400.0)
RBC: 4.49 Mil/uL (ref 4.22–5.81)
RDW: 13.8 % (ref 11.5–15.5)
WBC: 7.2 10*3/uL (ref 4.0–10.5)

## 2023-09-30 LAB — BASIC METABOLIC PANEL WITH GFR
BUN: 28 mg/dL — ABNORMAL HIGH (ref 6–23)
CO2: 27 meq/L (ref 19–32)
Calcium: 9.6 mg/dL (ref 8.4–10.5)
Chloride: 102 meq/L (ref 96–112)
Creatinine, Ser: 1.68 mg/dL — ABNORMAL HIGH (ref 0.40–1.50)
GFR: 39.61 mL/min — ABNORMAL LOW (ref >60.00)
Glucose, Bld: 129 mg/dL — ABNORMAL HIGH (ref 70–99)
Potassium: 4.2 meq/L (ref 3.5–5.1)
Sodium: 139 meq/L (ref 135–145)

## 2023-09-30 LAB — HEPATIC FUNCTION PANEL
ALT: 17 U/L (ref 0–53)
AST: 7 U/L (ref 0–37)
Albumin: 4.5 g/dL (ref 3.5–5.2)
Alkaline Phosphatase: 63 U/L (ref 39–117)
Bilirubin, Direct: 0.1 mg/dL (ref 0.0–0.3)
Total Bilirubin: 0.6 mg/dL (ref 0.2–1.2)
Total Protein: 6.6 g/dL (ref 6.0–8.3)

## 2023-09-30 LAB — LIPID PANEL
Cholesterol: 152 mg/dL (ref 0–200)
HDL: 42 mg/dL (ref >39.00)
LDL Cholesterol: 68 mg/dL (ref 0–99)
NonHDL: 110.28
Total CHOL/HDL Ratio: 4
Triglycerides: 210 mg/dL — ABNORMAL HIGH (ref 0.0–149.0)
VLDL: 42 mg/dL — ABNORMAL HIGH (ref 0.0–40.0)

## 2023-09-30 LAB — HEMOGLOBIN A1C: Hgb A1c MFr Bld: 6 % (ref 4.6–6.5)

## 2023-09-30 LAB — TSH: TSH: 1.9 u[IU]/mL (ref 0.35–5.50)

## 2023-09-30 MED ORDER — BOOSTRIX 5-2.5-18.5 LF-MCG/0.5 IM SUSP: 0.5000 mL | Freq: Once | INTRAMUSCULAR | 0 refills | Status: AC

## 2023-09-30 MED ORDER — SHINGRIX 50 MCG/0.5ML IM SUSR
0.5000 mL | Freq: Once | INTRAMUSCULAR | 1 refills | Status: AC
Start: 2023-09-30 — End: 2023-09-30

## 2023-09-30 NOTE — Progress Notes (Unsigned)
 Subjective:  Patient ID: Marcus Crawford, male    DOB: [birth year]  Age: 74 y.o. MRN: [id number]  CC: Hypertension and Hyperlipidemia   HPI Marcus Crawford presents for f/up ----  Discussed the use of AI scribe software for clinical note transcription with the patient, who gave verbal consent to proceed.  History of Present Illness   Marcus Crawford "Rosanne Ashing" is a 73 year old male who presents with dizziness and fatigue.  He has been experiencing dizziness, particularly when walking, for the past five to six months. He associates this symptom with low blood pressure, recalling a reading of 113/77 mmHg during a therapy session for his back. He is currently taking indapamide and Olmesartan for blood pressure management.  He reports ongoing fatigue, although it has improved significantly compared to a year ago. He attributes the initial fatigue to an Epstein-Barr virus infection, which he understands can take several months to resolve. Despite the improvement, he still feels 'a little foggy headed' and has not regained his previous weight, maintaining around 200-201 pounds. His current medications include duloxetine 20 mg, Seroquel (quetiapine), and trazodone. No side effects from these medications. His appetite and activity levels are good.  No chest pain, shortness of breath, or swelling in his legs or feet. No symptoms of depression and his mood is okay.       Outpatient Medications Prior to Visit  Medication Sig Dispense Refill   cholecalciferol (VITAMIN D) 1000 UNITS tablet Take 2,000 Units by mouth daily. Taking 2000 units daily     cyanocobalamin (VITAMIN B12) 1000 MCG tablet Take 1,000 mcg by mouth daily.     DULoxetine (CYMBALTA) 20 MG capsule Take 20 mg by mouth daily.     levocetirizine (XYZAL) 5 MG tablet TAKE ONE TABLET BY MOUTH EVERY EVENING 90 tablet 1   olmesartan (BENICAR) 20 MG tablet TAKE 1 TABLET(20 MG) BY MOUTH DAILY 90 tablet 0   Polyvinyl Alcohol-Povidone (REFRESH OP) Place  1 drop into both eyes 4 (four) times daily as needed (dryness).     pravastatin (PRAVACHOL) 10 MG tablet TAKE 1 TABLET(10 MG) BY MOUTH DAILY 90 tablet 0   QUEtiapine (SEROQUEL) 25 MG tablet Take 1 tablet (25 mg total) by mouth at bedtime. 90 tablet 0   RABEprazole (ACIPHEX) 20 MG tablet TAKE ONE TABLET BY MOUTH ONE TIME DAILY 90 tablet 1   traZODone (DESYREL) 50 MG tablet TAKE 1 TABLET(50 MG) BY MOUTH AT BEDTIME 90 tablet 0   indapamide (LOZOL) 1.25 MG tablet TAKE 1 TABLET(1.25 MG) BY MOUTH DAILY 90 tablet 0   DULoxetine (CYMBALTA) 20 MG capsule Take 1 capsule (20 mg total) by mouth daily. 30 capsule 2   No facility-administered medications prior to visit.    ROS Review of Systems  Constitutional:  Positive for fatigue. Negative for appetite change, diaphoresis and unexpected weight change.  HENT: Negative.    Eyes: Negative.   Respiratory: Negative.  Negative for cough, chest tightness and wheezing.   Cardiovascular:  Negative for chest pain, palpitations and leg swelling.  Gastrointestinal:  Negative for abdominal pain, diarrhea, nausea and vomiting.  Endocrine: Negative.   Genitourinary: Negative.  Negative for difficulty urinating.  Musculoskeletal: Negative.   Skin: Negative.   Neurological:  Positive for dizziness and light-headedness. Negative for weakness.  Hematological:  Negative for adenopathy. Does not bruise/bleed easily.  Psychiatric/Behavioral: Negative.      Objective:  BP 114/78 (BP Location: Right Arm, Patient Position: Sitting, Cuff Size: Normal)   Pulse  78   Temp 98.3 F (36.8 C) (Oral)   Resp 16   Ht 6\' 1"  (1.854 m)   Wt 201 lb (91.2 kg)   SpO2 98%   BMI 26.52 kg/m   BP Readings from Last 3 Encounters:  09/30/23 114/78  06/27/23 138/88  06/06/23 (!) 158/90    Wt Readings from Last 3 Encounters:  09/30/23 201 lb (91.2 kg)  06/27/23 203 lb 9.6 oz (92.4 kg)  06/06/23 205 lb (93 kg)    Physical Exam Vitals reviewed.  Constitutional:       Appearance: Normal appearance.  HENT:     Nose: Nose normal.     Mouth/Throat:     Mouth: Mucous membranes are moist.  Eyes:     General: No scleral icterus.    Conjunctiva/sclera: Conjunctivae normal.  Cardiovascular:     Rate and Rhythm: Normal rate and regular rhythm.     Heart sounds: No murmur heard.    No friction rub. No gallop.  Pulmonary:     Effort: Pulmonary effort is normal.     Breath sounds: No stridor. No wheezing, rhonchi or rales.  Abdominal:     General: Abdomen is flat.     Palpations: There is no mass.     Tenderness: There is no abdominal tenderness. There is no guarding.     Hernia: No hernia is present.  Musculoskeletal:     Cervical back: Neck supple.  Lymphadenopathy:     Cervical: No cervical adenopathy.  Skin:    General: Skin is warm and dry.  Neurological:     General: No focal deficit present.     Mental Status: He is alert. Mental status is at baseline.     Lab Results  Component Value Date   WBC 7.2 09/30/2023   HGB 13.9 09/30/2023   HCT 41.4 09/30/2023   PLT 180.0 09/30/2023   GLUCOSE 129 (H) 09/30/2023   CHOL 152 09/30/2023   TRIG 210.0 (H) 09/30/2023   HDL 42.00 09/30/2023   LDLDIRECT 79.0 11/06/2022   LDLCALC 68 09/30/2023   ALT 17 09/30/2023   AST 7 09/30/2023   NA 139 09/30/2023   K 4.2 09/30/2023   CL 102 09/30/2023   CREATININE 1.68 (H) 09/30/2023   BUN 28 (H) 09/30/2023   CO2 27 09/30/2023   TSH 1.90 09/30/2023   PSA 0.00 Repeated and verified X2. (L) 02/06/2023   INR 1.12 02/28/2012   HGBA1C 6.0 09/30/2023   MICROALBUR 0.4 10/14/2008    MR THORACIC SPINE WO CONTRAST Result Date: 07/08/2023 CLINICAL DATA:  Chronic thoracic spine pain, worse over the past year. No known injury. EXAM: MRI THORACIC SPINE WITHOUT CONTRAST TECHNIQUE: Multiplanar, multisequence MR imaging of the thoracic spine was performed. No intravenous contrast was administered. COMPARISON:  MRI thoracic spine 03/23/2023. FINDINGS: Alignment: Normal.  Exaggeration of the normal thoracic kyphosis is again seen. Vertebrae: No fracture or evidence of discitis. A few small Schmorl's nodes are identified, most prominent at the T7-8 level. 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 Also again seen is a predominantly T2 hyperintense lesion in the right T6 pedicle which is unchanged in appearance. Punctate, predominantly T2 hyperintense lesion in T12 is also unchanged. 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 Cord:  Normal signal throughout. Paraspinal and other soft tissues: Negative. Disc levels: Very mild multilevel disc bulging and scattered facet degenerative disease again seen. The central canal and foramina are open at  all levels. IMPRESSION: 1. No change in the appearance of the thoracic spine since the prior exam. Mild thoracic spondylosis without central canal or foraminal narrowing. 2. Stable appearance of a T2 hyperintense lesion in the right T6 pedicle and a punctate T2 hyperintense lesion in T12. 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 No follow-up imaging is recommend. Electronically Signed   By: Drusilla Kanner M.D.   On: 07/08/2023 10:03    Assessment & Plan:   Essential hypertension- His BP is over-controlled. Will discontinue the thiazide diuretic. 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 Doing well on the statin  -     Lipid panel; Future -     Hepatic function panel; Future -     TSH; Future  Stage 3a chronic kidney disease (HCC)- Will avoid nephrotoxic agents   Other  orders -     Boostrix; Inject 0.5 mLs into the muscle once for 1 dose.  Dispense: 0.5 mL; Refill: 0 -     Shingrix; Inject 0.5 mLs into the muscle once for 1 dose.  Dispense: 0.5 mL; Refill: 1     Follow-up: Return in about 4 months (around 01/30/2024).  Sanda Linger, MD

## 2023-09-30 NOTE — Patient Instructions (Signed)
 Hypertension, Adult High blood pressure (hypertension) is when the force of blood pumping through the arteries is too strong. The arteries are the blood vessels that carry blood from the heart throughout the body. Hypertension forces the heart to work harder to pump blood and may cause arteries to become narrow or stiff. Untreated or uncontrolled hypertension can lead to a heart attack, heart failure, a stroke, kidney disease, and other problems. A blood pressure reading consists of a higher number over a lower number. Ideally, your blood pressure should be below 120/80. The first ("top") number is called the systolic pressure. It is a measure of the pressure in your arteries as your heart beats. The second ("bottom") number is called the diastolic pressure. It is a measure of the pressure in your arteries as the heart relaxes. What are the causes? The exact cause of this condition is not known. There are some conditions that result in high blood pressure. What increases the risk? Certain factors may make you more likely to develop high blood pressure. Some of these risk factors are under your control, including: Smoking. Not getting enough exercise or physical activity. Being overweight. Having too much fat, sugar, calories, or salt (sodium) in your diet. Drinking too much alcohol. Other risk factors include: Having a personal history of heart disease, diabetes, high cholesterol, or kidney disease. Stress. Having a family history of high blood pressure and high cholesterol. Having obstructive sleep apnea. Age. The risk increases with age. What are the signs or symptoms? High blood pressure may not cause symptoms. Very high blood pressure (hypertensive crisis) may cause: Headache. Fast or irregular heartbeats (palpitations). Shortness of breath. Nosebleed. Nausea and vomiting. Vision changes. Severe chest pain, dizziness, and seizures. How is this diagnosed? This condition is diagnosed by  measuring your blood pressure while you are seated, with your arm resting on a flat surface, your legs uncrossed, and your feet flat on the floor. The cuff of the blood pressure monitor will be placed directly against the skin of your upper arm at the level of your heart. Blood pressure should be measured at least twice using the same arm. Certain conditions can cause a difference in blood pressure between your right and left arms. If you have a high blood pressure reading during one visit or you have normal blood pressure with other risk factors, you may be asked to: Return on a different day to have your blood pressure checked again. Monitor your blood pressure at home for 1 week or longer. If you are diagnosed with hypertension, you may have other blood or imaging tests to help your health care provider understand your overall risk for other conditions. How is this treated? This condition is treated by making healthy lifestyle changes, such as eating healthy foods, exercising more, and reducing your alcohol intake. You may be referred for counseling on a healthy diet and physical activity. Your health care provider may prescribe medicine if lifestyle changes are not enough to get your blood pressure under control and if: Your systolic blood pressure is above 130. Your diastolic blood pressure is above 80. Your personal target blood pressure may vary depending on your medical conditions, your age, and other factors. Follow these instructions at home: Eating and drinking  Eat a diet that is high in fiber and potassium, and low in sodium, added sugar, and fat. An example of this eating plan is called the DASH diet. DASH stands for Dietary Approaches to Stop Hypertension. To eat this way: Eat  plenty of fresh fruits and vegetables. Try to fill one half of your plate at each meal with fruits and vegetables. Eat whole grains, such as whole-wheat pasta, brown rice, or whole-grain bread. Fill about one  fourth of your plate with whole grains. Eat or drink low-fat dairy products, such as skim milk or low-fat yogurt. Avoid fatty cuts of meat, processed or cured meats, and poultry with skin. Fill about one fourth of your plate with lean proteins, such as fish, chicken without skin, beans, eggs, or tofu. Avoid pre-made and processed foods. These tend to be higher in sodium, added sugar, and fat. Reduce your daily sodium intake. Many people with hypertension should eat less than 1,500 mg of sodium a day. Do not drink alcohol if: Your health care provider tells you not to drink. You are pregnant, may be pregnant, or are planning to become pregnant. If you drink alcohol: Limit how much you have to: 0-1 drink a day for women. 0-2 drinks a day for men. Know how much alcohol is in your drink. In the [place] one drink equals one 12 oz bottle of beer (355 mL), one 5 oz glass of wine (148 mL), or one 1 oz glass of hard liquor (44 mL). Lifestyle  Work with your health care provider to maintain a healthy body weight or to lose weight. Ask what an ideal weight is for you. Get at least 30 minutes of exercise that causes your heart to beat faster (aerobic exercise) most days of the week. Activities may include walking, swimming, or biking. Include exercise to strengthen your muscles (resistance exercise), such as Pilates or lifting weights, as part of your weekly exercise routine. Try to do these types of exercises for 30 minutes at least 3 days a week. Do not use any products that contain nicotine or tobacco. These products include cigarettes, chewing tobacco, and vaping devices, such as e-cigarettes. If you need help quitting, ask your health care provider. Monitor your blood pressure at home as told by your health care provider. Keep all follow-up visits. This is important. Medicines Take over-the-counter and prescription medicines only as told by your health care provider. Follow directions carefully. Blood  pressure medicines must be taken as prescribed. Do not skip doses of blood pressure medicine. Doing this puts you at risk for problems and can make the medicine less effective. Ask your health care provider about side effects or reactions to medicines that you should watch for. Contact a health care provider if you: Think you are having a reaction to a medicine you are taking. Have headaches that keep coming back (recurring). Feel dizzy. Have swelling in your ankles. Have trouble with your vision. Get help right away if you: Develop a severe headache or confusion. Have unusual weakness or numbness. Feel faint. Have severe pain in your chest or abdomen. Vomit repeatedly. Have trouble breathing. These symptoms may be an emergency. Get help right away. Call 911. Do not wait to see if the symptoms will go away. Do not drive yourself to the hospital. Summary Hypertension is when the force of blood pumping through your arteries is too strong. If this condition is not controlled, it may put you at risk for serious complications. Your personal target blood pressure may vary depending on your medical conditions, your age, and other factors. For most people, a normal blood pressure is less than 120/80. Hypertension is treated with lifestyle changes, medicines, or a combination of both. Lifestyle changes include losing weight, eating a healthy,  low-sodium diet, exercising more, and limiting alcohol. This information is not intended to replace advice given to you by your health care provider. Make sure you discuss any questions you have with your health care provider. Document Revised: 04/25/2021 Document Reviewed: 04/25/2021 Elsevier Patient Education  2024 [employer].

## 2023-10-09 ENCOUNTER — Other Ambulatory Visit: Payer: Self-pay | Admitting: Internal Medicine

## 2023-10-09 DIAGNOSIS — F322 Major depressive disorder, single episode, severe without psychotic features: Secondary | ICD-10-CM

## 2023-10-10 ENCOUNTER — Telehealth (HOSPITAL_COMMUNITY): Payer: Self-pay | Admitting: *Deleted

## 2023-10-10 NOTE — Telephone Encounter (Signed)
 Yes if he like to discontinue he need to taper down to take 1 capsule every other day for minimum 2 weeks.

## 2023-10-10 NOTE — Telephone Encounter (Signed)
 LVM for pt advising to taper 1 capsule Cymbalta 20 mg every other day for minimum 2 week. Pt encouraged to call this nurse back with any questions or concerns.

## 2023-10-10 NOTE — Telephone Encounter (Signed)
 Pt would like to d/c the Cymbalta 20 mg every day and is asking if he needs to taper, possibly 1 capsule every other day. Pt has f/u scheduled on 11/07/23. His previous visit was on 08/08/23. Please review and advise.

## 2023-10-16 DIAGNOSIS — Z8546 Personal history of malignant neoplasm of prostate: Secondary | ICD-10-CM | POA: Diagnosis not present

## 2023-10-23 ENCOUNTER — Telehealth (HOSPITAL_COMMUNITY): Payer: Self-pay | Admitting: *Deleted

## 2023-10-23 ENCOUNTER — Other Ambulatory Visit (HOSPITAL_COMMUNITY): Payer: Self-pay | Admitting: *Deleted

## 2023-10-23 DIAGNOSIS — N3941 Urge incontinence: Secondary | ICD-10-CM | POA: Diagnosis not present

## 2023-10-23 DIAGNOSIS — Z8546 Personal history of malignant neoplasm of prostate: Secondary | ICD-10-CM | POA: Diagnosis not present

## 2023-10-23 DIAGNOSIS — N471 Phimosis: Secondary | ICD-10-CM | POA: Diagnosis not present

## 2023-10-23 MED ORDER — DULOXETINE HCL 20 MG PO CPEP
20.0000 mg | ORAL_CAPSULE | Freq: Every day | ORAL | 0 refills | Status: DC
Start: 2023-10-23 — End: 2023-10-30

## 2023-10-23 NOTE — Telephone Encounter (Signed)
 Pt verbalizes understanding. Bridge prescription sent.

## 2023-10-23 NOTE — Telephone Encounter (Signed)
 At this time he is taking Cymbalta  to avoid withdrawals.  Unfortunately if he cannot handle the withdrawal symptoms he may need to take it 20 mg daily.

## 2023-10-23 NOTE — Telephone Encounter (Signed)
 Pt called stating that since he has tapered down the Cymbalta  to 20 mg every other day he "feels horrible". Taper was started on 10/10/23. Pt has f/u scheduled for 11/07/23, please review and advise.

## 2023-10-24 ENCOUNTER — Ambulatory Visit: Admitting: Family Medicine

## 2023-10-30 ENCOUNTER — Other Ambulatory Visit (HOSPITAL_COMMUNITY): Payer: Self-pay | Admitting: *Deleted

## 2023-10-30 ENCOUNTER — Telehealth (HOSPITAL_COMMUNITY): Payer: Self-pay | Admitting: *Deleted

## 2023-10-30 MED ORDER — DULOXETINE HCL 30 MG PO CPEP
30.0000 mg | ORAL_CAPSULE | Freq: Every day | ORAL | 0 refills | Status: DC
Start: 2023-10-30 — End: 2023-11-07

## 2023-10-30 NOTE — Telephone Encounter (Signed)
 He can try 30 mg but in the past he wanted to come off and if he increase the dose it would be difficult for him to come off the medication.  He may need to take 30 mg for a while as reducing the dose can cause withdrawal symptoms which happen before.  If agree please call 30 mg Cymbalta  to his pharmacy

## 2023-10-30 NOTE — Telephone Encounter (Signed)
 LVM for pt. Bridge script sent to [facility] on [address].

## 2023-10-30 NOTE — Telephone Encounter (Signed)
 Pt called to ask if he may increase Cymbalta  dose up to 30 mg. Pt states that is has been feeling "nervous and jerky" since being on the 20 mg dose. Pt also c/o decreased appetite. F/u appointment is scheduled for 11/07/23. Please review and advise.

## 2023-11-04 DIAGNOSIS — I129 Hypertensive chronic kidney disease with stage 1 through stage 4 chronic kidney disease, or unspecified chronic kidney disease: Secondary | ICD-10-CM | POA: Diagnosis not present

## 2023-11-04 DIAGNOSIS — Z8679 Personal history of other diseases of the circulatory system: Secondary | ICD-10-CM | POA: Diagnosis not present

## 2023-11-04 DIAGNOSIS — N183 Chronic kidney disease, stage 3 unspecified: Secondary | ICD-10-CM | POA: Diagnosis not present

## 2023-11-07 ENCOUNTER — Other Ambulatory Visit: Payer: Self-pay

## 2023-11-07 ENCOUNTER — Encounter (HOSPITAL_COMMUNITY): Payer: Self-pay | Admitting: Psychiatry

## 2023-11-07 ENCOUNTER — Ambulatory Visit (HOSPITAL_COMMUNITY): Payer: Medicare Other | Admitting: Psychiatry

## 2023-11-07 VITALS — Ht 72.0 in | Wt 198.0 lb

## 2023-11-07 DIAGNOSIS — F331 Major depressive disorder, recurrent, moderate: Secondary | ICD-10-CM | POA: Diagnosis not present

## 2023-11-07 DIAGNOSIS — F5104 Psychophysiologic insomnia: Secondary | ICD-10-CM | POA: Diagnosis not present

## 2023-11-07 DIAGNOSIS — G9332 Myalgic encephalomyelitis/chronic fatigue syndrome: Secondary | ICD-10-CM | POA: Diagnosis not present

## 2023-11-07 MED ORDER — DULOXETINE HCL 30 MG PO CPEP: 30.0000 mg | ORAL_CAPSULE | Freq: Every morning | ORAL | 0 refills | Status: DC

## 2023-11-07 MED ORDER — TRAZODONE HCL 50 MG PO TABS: 50.0000 mg | ORAL_TABLET | Freq: Every day | ORAL | 0 refills | Status: DC

## 2023-11-07 MED ORDER — QUETIAPINE FUMARATE 25 MG PO TABS
25.0000 mg | ORAL_TABLET | Freq: Every day | ORAL | 0 refills | Status: DC
Start: 2023-11-07 — End: 2024-02-06

## 2023-11-07 NOTE — Progress Notes (Signed)
 [facility] MD/PA/NP OP Progress Note  Patient location; office Provider location; office  11/07/2023 3:00 PM EARNEST REGINA  MRN:  [id number]  Chief Complaint:  Chief Complaint  Patient presents with   Follow-up   Anxiety   HPI: Patient came today for his follow-up appointment.  He tried to cut down his Cymbalta  but started to have dizziness, anxiety, nervousness, fatigue and could not function.  He called our office and we recommend to go back on Cymbalta  at this time he like to stay on Cymbalta  because he cannot function without the medicine.  Patient now know about chronic fatigue syndrome and association with EBV.  He feels until and unless his symptoms completely not resolved would like to keep on Cymbalta .  He also taking trazodone  and Seroquel  which is keeping his sleep stable and he is getting at least 78 hours.  He like to visit his sister in [place]  but has no motivation to drive.  He admitted sometime lack of desire to do things and he noticed Cymbalta  had helped him a lot.  He has mild tremors but they are not as bad.  He is also looking for a new primary care and until then he wants to get refills from our office.  His Seroquel  and trazodone  is given by Dr. Rosalia Colonel.  He denies drinking or using any illegal substances.  He is using CPAP.  He has no major concern other than mild tremors which he understand could be due to medication.  He denies drinking or using any illegal substances.  He lives with his wife who has cancer.  He denies any aggression, violence, paranoia, suicidal thoughts.    ICD-10-CM   1. MDD (major depressive disorder), recurrent episode, moderate (HCC)  F33.1 QUEtiapine  (SEROQUEL ) 25 MG tablet    2. Psychophysiological insomnia  F51.04 DULoxetine  (CYMBALTA ) 30 MG capsule    QUEtiapine  (SEROQUEL ) 25 MG tablet    traZODone  (DESYREL ) 50 MG tablet    3. Chronic fatigue disorder  G93.32 DULoxetine  (CYMBALTA ) 30 MG capsule    QUEtiapine  (SEROQUEL ) 25 MG tablet         Past Psychiatric History: No history of suicidal attempt, mania, psychosis, delusion, PTSD or inpatient services.  Started taking antidepressant last year from primary care.  He was given Seroquel , Cymbalta  and Concerta .  Unexplained fatigue after the episode while doing the lawn more.  Had a few ER visit for extreme fatigue.  Seroquel  was discontinued by this Clinical research associate.  Past Medical History:  Past Medical History:  Diagnosis Date   Anemia    early 20's   Bell's palsy 1977   Blood clot in vein 2005   left leg "blood clot"-treated as OP; no residual   Blood transfusion without reported diagnosis    with appendectomy   Borderline diabetic    no meds   Cancer (HCC) 08/2013   prostate/ had surgery/ no chemo or radiation   Cataract    forming   CKD (chronic kidney disease) stage 3, GFR 30-59 ml/min (HCC) saw dr Arlis Lakes 6 months ago   on rampiril for kidney function also   Colonic polyp    X3 ,hyperplastic   Diverticulosis of colon    GERD (gastroesophageal reflux disease)    H/O hiatal hernia    Hemorrhoid    Hyperlipidemia    Hypertension    past hx - no meds since 2013 with  adrenal gland surgery   Kidney stone on left side 2013   asymptomatic , incidental  finding   Migraine last 6-7 yrs ago   PMH of ; resolved post adrenal adenoma resection   Nephrolithiasis 2013   kidney stone  as incidental finding on imaging   PONV (postoperative nausea and vomiting)     Past Surgical History:  Procedure Laterality Date   adrenal venous sampling  03-2012   ADRENALECTOMY  04/14/2012   Procedure: ADRENALECTOMY;  Surgeon: Shela Derby, MD;  Location: [facility];  Service: General;  Laterality: Left;  Left Adrenal Excision   APPENDECTOMY  1985   COLONOSCOPY     last 9/12, Dr Sandrea Cruel; due 2017   ESOPHAGEAL DILATION  2001   Dr [employer]   POLYPECTOMY      X 3   ROBOT ASSISTED LAPAROSCOPIC RADICAL PROSTATECTOMY N/A 09/07/2013   Procedure: ROBOTIC ASSISTED LAPAROSCOPIC RADICAL PROSTATECTOMY  LEVEL 1;  Surgeon: Kristeen Peto, MD;  Location: [facility];  Service: Urology;  Laterality: N/A;    Family Psychiatric History: Reviewed  Family History:  Family History  Problem Relation Age of Onset   Rectal cancer Mother    Colon cancer Mother 58   Heart attack Mother 42   Colon polyps Sister    Lung cancer Sister        smoker   Diabetes Sister    Breast cancer Sister    Alzheimer's disease Sister    Heart failure Brother    Kidney cancer Brother    Prostate cancer Brother 29   Heart disease Brother    Hypertension Brother    Kidney cancer Brother    Diabetes Brother    Heart attack Brother 18   Esophageal cancer Maternal Uncle    Kidney cancer Maternal Uncle    Stomach cancer Neg Hx     Social History:  Social History   Socioeconomic History   Marital status: Married    Spouse name: Abran Abrahams   Number of children: 0   Years of education: Not on file   Highest education level: Some college, no degree  Occupational History   Occupation: retired  Tobacco Use   Smoking status: Never   Smokeless tobacco: Never  Vaping Use   Vaping status: Never Used  Substance and Sexual Activity   Alcohol use: Not Currently   Drug use: No   Sexual activity: Yes  Other Topics Concern   Not on file  Social History Narrative   Right handed        Are you currently employed ?    What is your current occupation?retired   Do you live at home alone?   Who lives with you?    What type of home do you live in: 1 story or 2 story?    Caffiene  1 daily   Social Drivers of Corporate investment banker Strain: Low Risk  (06/25/2023)   Overall Financial Resource Strain (CARDIA)    Difficulty of Paying Living Expenses: Not hard at all  Food Insecurity: No Food Insecurity (06/25/2023)   Hunger Vital Sign    Worried About Running Out of Food in the Last Year: Never true    Ran Out of Food in the Last Year: Never true  Transportation Needs: No Transportation Needs (05/11/2023)   PRAPARE -  Administrator, Civil Service (Medical): No    Lack of Transportation (Non-Medical): No  Physical Activity: Insufficiently Active (06/25/2023)   Exercise Vital Sign    Days of Exercise per Week: 1 day    Minutes of Exercise per Session:  10 min  Stress: Stress Concern Present (05/11/2023)   [employer] of Occupational Health - Occupational Stress Questionnaire    Feeling of Stress : To some extent  Social Connections: Moderately Integrated (06/25/2023)   Social Connection and Isolation Panel [NHANES]    Frequency of Communication with Friends and Family: More than three times a week    Frequency of Social Gatherings with Friends and Family: Twice a week    Attends Religious Services: Never    Database administrator or Organizations: Yes    Attends Banker Meetings: 1 to 4 times per year    Marital Status: Married    Allergies:  Allergies  Allergen Reactions   Penicillins Rash    Rash (RN clarified with pt) No SOB/swelling   Sulfa Antibiotics Rash    Metabolic Disorder Labs: Lab Results  Component Value Date   HGBA1C 6.0 09/30/2023   MPG 117 01/26/2020   Lab Results  Component Value Date   PROLACTIN 5.6 02/20/2022   Lab Results  Component Value Date   CHOL 152 09/30/2023   TRIG 210.0 (H) 09/30/2023   HDL 42.00 09/30/2023   CHOLHDL 4 09/30/2023   VLDL 42.0 (H) 09/30/2023   LDLCALC 68 09/30/2023   LDLCALC 115 (H) 01/26/2020   Lab Results  Component Value Date   TSH 1.90 09/30/2023   TSH 2.11 11/06/2022    Therapeutic Level Labs: No results found for: "LITHIUM" No results found for: "VALPROATE" No results found for: "CBMZ"  Current Medications: Current Outpatient Medications  Medication Sig Dispense Refill   cholecalciferol (VITAMIN D ) 1000 UNITS tablet Take 2,000 Units by mouth daily. Taking 2000 units daily     cyanocobalamin  (VITAMIN B12) 1000 MCG tablet Take 1,000 mcg by mouth daily.     DULoxetine  (CYMBALTA ) 30 MG capsule  Take 1 capsule (30 mg total) by mouth daily. 10 capsule 0   levocetirizine (XYZAL ) 5 MG tablet TAKE ONE TABLET BY MOUTH EVERY EVENING 90 tablet 1   olmesartan  (BENICAR ) 20 MG tablet TAKE 1 TABLET(20 MG) BY MOUTH DAILY 90 tablet 0   Polyvinyl Alcohol-Povidone (REFRESH OP) Place 1 drop into both eyes 4 (four) times daily as needed (dryness).     pravastatin  (PRAVACHOL ) 10 MG tablet TAKE 1 TABLET(10 MG) BY MOUTH DAILY 90 tablet 0   QUEtiapine  (SEROQUEL ) 25 MG tablet Take 1 tablet (25 mg total) by mouth at bedtime. 90 tablet 0   RABEprazole  (ACIPHEX ) 20 MG tablet TAKE ONE TABLET BY MOUTH ONE TIME DAILY 90 tablet 1   traZODone  (DESYREL ) 50 MG tablet TAKE 1 TABLET(50 MG) BY MOUTH AT BEDTIME 90 tablet 0   No current facility-administered medications for this visit.   Psychiatric Specialty Exam: Physical Exam  Review of Systems  Constitutional:  Positive for fatigue.  Neurological:  Positive for tremors.  Psychiatric/Behavioral:  The patient is nervous/anxious.     Height 6' (1.829 m), weight 198 lb (89.8 kg).Body mass index is 26.85 kg/m.  General Appearance: Casual  Eye Contact:  Fair  Speech:  Slow  Volume:  Decreased  Mood:  Anxious  Affect:  Appropriate  Thought Process:  Goal Directed  Orientation:  Full (Time, Place, and Person)  Thought Content:  Rumination  Suicidal Thoughts:  No  Homicidal Thoughts:  No  Memory:  Immediate;   Good Recent;   Fair Remote;   Fair  Judgement:  Fair  Insight:  Shallow  Psychomotor Activity:  Decreased  Concentration:  Concentration: Fair and Attention Span:  Fair  Recall:  Iona Manis of Knowledge:  Good  Language:  Good  Akathisia:  No  Handed:  Right  AIMS (if indicated):     Assets:  Communication Skills Desire for Improvement Housing Social Support Transportation  ADL's:  Intact  Cognition:  WNL  Sleep:   ok        Screenings: GAD-7    Loss adjuster, chartered Office Visit from 06/27/2023 in [facility] [place] Office Visit from 03/21/2023 [facility]  Total GAD-7 Score 1 1      PHQ2-9    Flowsheet Row Office Visit from 09/30/2023 in [facility] [place] Office Visit from 06/27/2023 in [facility] [place] Office Visit from 03/21/2023 in [facility] Clinical Support from 01/07/2023 in [facility] [place] Office Visit from 11/06/2022 in [facility] [place]  PHQ-2 Total Score 0 0 0 0 0  PHQ-9 Total Score 0 3 4 5 4       Flowsheet Row Office Visit from 03/21/2023 in [facility] ED from 05/02/2022 in [facility] Emergency Department at [facility] ED from 04/10/2022 in [facility] Department at [facility]  C-SSRS RISK CATEGORY No Risk No Risk No Risk        Assessment and Plan:  Reviewed blood work results.  His creatinine is slightly high however his PCP cut down amlodipine.  Patient does not want to cut down the Cymbalta  since he feel worsening of fatigue, dizziness and anxiety.  I reviewed blood work results.  He will continue Seroquel  25 mg at bedtime, Cymbalta  30 mg daily which was recently increased and patient like to keep on the current dose and trazodone  50 mg at bedtime.  Discussed medication side effects and benefits.  Encourage hydration as creatinine slightly high.  Patient is in the process of finding a new PCP.  Recommend to call us  back if is any question or any concern.  Follow-up in 3 months.  Collaboration of Care: Collaboration of Care: Other provider involved in patient's care AEB I will forward my note to primary care.  Patient/Guardian was advised Release of Information must be obtained prior to any record release in order to collaborate their care with an outside provider. Patient/Guardian was advised if they have not already  done so to contact the registration department to sign all necessary forms in order for us  to release information regarding their care.   Consent: Patient/Guardian gives verbal consent for treatment and assignment of benefits for services provided during this visit. Patient/Guardian expressed understanding and agreed to proceed.   I spent 28 minutes face-to-face time with the patient during this encounter.  Arturo Late, MD 11/07/2023, 3:00 PM

## 2023-11-08 ENCOUNTER — Ambulatory Visit (HOSPITAL_BASED_OUTPATIENT_CLINIC_OR_DEPARTMENT_OTHER)
Admission: RE | Admit: 2023-11-08 | Discharge: 2023-11-08 | Disposition: A | Discharge status: Home / Self Care | Source: Ambulatory Visit | Attending: Urgent Care

## 2023-11-08 ENCOUNTER — Ambulatory Visit
Admission: EM | Admit: 2023-11-08 | Discharge: 2023-11-08 | Disposition: A | Discharge status: Home / Self Care | Attending: Family Medicine | Admitting: Family Medicine

## 2023-11-08 DIAGNOSIS — K5900 Constipation, unspecified: Secondary | ICD-10-CM | POA: Diagnosis not present

## 2023-11-08 DIAGNOSIS — K6389 Other specified diseases of intestine: Secondary | ICD-10-CM | POA: Diagnosis not present

## 2023-11-08 DIAGNOSIS — R109 Unspecified abdominal pain: Secondary | ICD-10-CM | POA: Diagnosis not present

## 2023-11-08 DIAGNOSIS — R1084 Generalized abdominal pain: Secondary | ICD-10-CM | POA: Insufficient documentation

## 2023-11-08 MED ORDER — POLYETHYLENE GLYCOL 3350 17 GM/SCOOP PO POWD: 17.0000 g | Freq: Every day | ORAL | 0 refills | Status: AC | PRN

## 2023-11-08 MED ORDER — DOCUSATE SODIUM 100 MG PO CAPS: 100.0000 mg | ORAL_CAPSULE | Freq: Two times a day (BID) | ORAL | 0 refills | Status: AC

## 2023-11-08 NOTE — ED Provider Notes (Signed)
 [facility]  Note:  This document was prepared using Conservation officer, historic buildings and may include unintentional dictation errors.  MRN: [id number] DOB: 07/28/1950  Subjective:   Marcus Crawford is a 73 y.o. male presenting for 5-day history of constipation.  Last bowel movement was on Monday.  Patient reports longstanding history of chronic constipation.  Has used Dulcolax once without any relief.  He is still able to tolerate foods, liquids.  He is able to pass gas.  Has had a prostatectomy.  Has a history of diverticulosis.  No active blood in the stool.  Has CKD stage III.  No current facility-administered medications for this encounter.  Current Outpatient Medications:    cholecalciferol (VITAMIN D ) 1000 UNITS tablet, Take 2,000 Units by mouth daily. Taking 2000 units daily, Disp: , Rfl:    cyanocobalamin  (VITAMIN B12) 1000 MCG tablet, Take 1,000 mcg by mouth daily., Disp: , Rfl:    DULoxetine  (CYMBALTA ) 30 MG capsule, Take 1 capsule (30 mg total) by mouth in the morning., Disp: 90 capsule, Rfl: 0   levocetirizine (XYZAL ) 5 MG tablet, TAKE ONE TABLET BY MOUTH EVERY EVENING, Disp: 90 tablet, Rfl: 1   olmesartan  (BENICAR ) 20 MG tablet, TAKE 1 TABLET(20 MG) BY MOUTH DAILY, Disp: 90 tablet, Rfl: 0   Polyvinyl Alcohol-Povidone (REFRESH OP), Place 1 drop into both eyes 4 (four) times daily as needed (dryness)., Disp: , Rfl:    pravastatin  (PRAVACHOL ) 10 MG tablet, TAKE 1 TABLET(10 MG) BY MOUTH DAILY, Disp: 90 tablet, Rfl: 0   QUEtiapine  (SEROQUEL ) 25 MG tablet, Take 1 tablet (25 mg total) by mouth at bedtime., Disp: 90 tablet, Rfl: 0   RABEprazole  (ACIPHEX ) 20 MG tablet, TAKE ONE TABLET BY MOUTH ONE TIME DAILY, Disp: 90 tablet, Rfl: 1   traZODone  (DESYREL ) 50 MG tablet, Take 1 tablet (50 mg total) by mouth at bedtime., Disp: 90 tablet, Rfl: 0   Allergies  Allergen Reactions   Penicillins Rash    Rash (RN clarified with pt) No SOB/swelling   Sulfa Antibiotics  Rash    Past Medical History:  Diagnosis Date   Anemia    early 20's   Bell's palsy 1977   Blood clot in vein 2005   left leg "blood clot"-treated as OP; no residual   Blood transfusion without reported diagnosis    with appendectomy   Borderline diabetic    no meds   Cancer (HCC) 08/2013   prostate/ had surgery/ no chemo or radiation   Cataract    forming   CKD (chronic kidney disease) stage 3, GFR 30-59 ml/min (HCC) saw dr Arlis Lakes 6 months ago   on rampiril for kidney function also   Colonic polyp    X3 ,hyperplastic   Diverticulosis of colon    GERD (gastroesophageal reflux disease)    H/O hiatal hernia    Hemorrhoid    Hyperlipidemia    Hypertension    past hx - no meds since 2013 with  adrenal gland surgery   Kidney stone on left side 2013   asymptomatic , incidental finding   Migraine last 6-7 yrs ago   PMH of ; resolved post adrenal adenoma resection   Nephrolithiasis 2013   kidney stone  as incidental finding on imaging   PONV (postoperative nausea and vomiting)      Past Surgical History:  Procedure Laterality Date   adrenal venous sampling  03-2012   ADRENALECTOMY  04/14/2012   Procedure: ADRENALECTOMY;  Surgeon: Shela Derby,  MD;  Location: [facility];  Service: General;  Laterality: Left;  Left Adrenal Excision   APPENDECTOMY  1985   COLONOSCOPY     last 9/12, Dr Sandrea Cruel; due 2017   ESOPHAGEAL DILATION  2001   Dr [employer]   POLYPECTOMY      X 3   ROBOT ASSISTED LAPAROSCOPIC RADICAL PROSTATECTOMY N/A 09/07/2013   Procedure: ROBOTIC ASSISTED LAPAROSCOPIC RADICAL PROSTATECTOMY LEVEL 1;  Surgeon: Kristeen Peto, MD;  Location: [facility];  Service: Urology;  Laterality: N/A;    Family History  Problem Relation Age of Onset   Rectal cancer Mother    Colon cancer Mother 53   Heart attack Mother 59   Colon polyps Sister    Lung cancer Sister        smoker   Diabetes Sister    Breast cancer Sister    Alzheimer's disease Sister    Heart failure Brother    Kidney  cancer Brother    Prostate cancer Brother 55   Heart disease Brother    Hypertension Brother    Kidney cancer Brother    Diabetes Brother    Heart attack Brother 51   Esophageal cancer Maternal Uncle    Kidney cancer Maternal Uncle    Stomach cancer Neg Hx     Social History   Tobacco Use   Smoking status: Never   Smokeless tobacco: Never  Vaping Use   Vaping status: Never Used  Substance Use Topics   Alcohol use: Not Currently   Drug use: No    ROS   Objective:   Vitals: BP 96/62 (BP Location: Left Arm)   Pulse 86   Temp 97.7 F (36.5 C) (Oral)   Resp 14   SpO2 93%   Physical Exam Constitutional:      General: He is not in acute distress.    Appearance: Normal appearance. He is well-developed and normal weight. He is not ill-appearing, toxic-appearing or diaphoretic.  HENT:     Head: Normocephalic and atraumatic.     Right Ear: External ear normal.     Left Ear: External ear normal.     Nose: Nose normal.     Mouth/Throat:     Pharynx: Oropharynx is clear.  Eyes:     General: No scleral icterus.       Right eye: No discharge.        Left eye: No discharge.     Extraocular Movements: Extraocular movements intact.  Cardiovascular:     Rate and Rhythm: Normal rate.  Pulmonary:     Effort: Pulmonary effort is normal.  Abdominal:     General: Bowel sounds are normal. There is no distension.     Palpations: Abdomen is soft. There is no mass.     Tenderness: There is no abdominal tenderness. There is no right CVA tenderness, left CVA tenderness, guarding or rebound.  Musculoskeletal:     Cervical back: Normal range of motion.  Neurological:     Mental Status: He is alert and oriented to person, place, and time.  Psychiatric:        Mood and Affect: Mood normal.        Behavior: Behavior normal.        Thought Content: Thought content normal.        Judgment: Judgment normal.      Recent Results (from the past 2160 hours)  TSH     Status: None    Collection Time: 09/30/23  1:34 PM  Result Value Ref Range   TSH 1.90 0.35 - 5.50 uIU/mL  Hemoglobin A1c     Status: None   Collection Time: 09/30/23  1:34 PM  Result Value Ref Range   Hgb A1c MFr Bld 6.0 4.6 - 6.5 %    Comment: Glycemic Control Guidelines for People with Diabetes:Non Diabetic:  <6%Goal of Therapy: <7%Additional Action Suggested:  >8%   Hepatic function panel     Status: None   Collection Time: 09/30/23  1:34 PM  Result Value Ref Range   Total Bilirubin 0.6 0.2 - 1.2 mg/dL   Bilirubin, Direct 0.1 0.0 - 0.3 mg/dL   Alkaline Phosphatase 63 39 - 117 U/L   AST 7 0 - 37 U/L   ALT 17 0 - 53 U/L   Total Protein 6.6 6.0 - 8.3 g/dL   Albumin 4.5 3.5 - 5.2 g/dL  CBC with Differential/Platelet     Status: None   Collection Time: 09/30/23  1:34 PM  Result Value Ref Range   WBC 7.2 4.0 - 10.5 K/uL   RBC 4.49 4.22 - 5.81 Mil/uL   Hemoglobin 13.9 13.0 - 17.0 g/dL   HCT 16.1 09.6 - 04.5 %   MCV 92.3 78.0 - 100.0 fl   MCHC 33.6 30.0 - 36.0 g/dL   RDW 40.9 81.1 - 91.4 %   Platelets 180.0 150.0 - 400.0 K/uL   Neutrophils Relative % 74.5 43.0 - 77.0 %   Lymphocytes Relative 16.6 12.0 - 46.0 %   Monocytes Relative 7.4 3.0 - 12.0 %   Eosinophils Relative 1.0 0.0 - 5.0 %   Basophils Relative 0.5 0.0 - 3.0 %   Neutro Abs 5.3 1.4 - 7.7 K/uL   Lymphs Abs 1.2 0.7 - 4.0 K/uL   Monocytes Absolute 0.5 0.1 - 1.0 K/uL   Eosinophils Absolute 0.1 0.0 - 0.7 K/uL   Basophils Absolute 0.0 0.0 - 0.1 K/uL  Basic metabolic panel with GFR     Status: Abnormal   Collection Time: 09/30/23  1:34 PM  Result Value Ref Range   Sodium 139 135 - 145 mEq/L   Potassium 4.2 3.5 - 5.1 mEq/L   Chloride 102 96 - 112 mEq/L   CO2 27 19 - 32 mEq/L   Glucose, Bld 129 (H) 70 - 99 mg/dL   BUN 28 (H) 6 - 23 mg/dL   Creatinine, Ser 7.82 (H) 0.40 - 1.50 mg/dL   GFR 95.62 (L) >13.08 mL/min    Comment: Calculated using the CKD-EPI Creatinine Equation (2021)   Calcium  9.6 8.4 - 10.5 mg/dL  Lipid panel     Status:  Abnormal   Collection Time: 09/30/23  1:34 PM  Result Value Ref Range   Cholesterol 152 0 - 200 mg/dL    Comment: ATP III Classification       Desirable:  < 200 mg/dL               Borderline High:  200 - 239 mg/dL          High:  > = 657 mg/dL   Triglycerides 846.9 (H) 0.0 - 149.0 mg/dL    Comment: Normal:  <629 mg/dLBorderline High:  150 - 199 mg/dL   HDL 52.84 >13.24 mg/dL   VLDL 40.1 (H) 0.0 - 02.7 mg/dL   LDL Cholesterol 68 0 - 99 mg/dL   Total CHOL/HDL Ratio 4     Comment:                Men  Women1/2 Average Risk     3.4          3.3Average Risk          5.0          4.42X Average Risk          9.6          7.13X Average Risk          15.0          11.0                       NonHDL 110.28     Comment: NOTE:  Non-HDL goal should be 30 mg/dL higher than patient's LDL goal (i.e. LDL goal of < 70 mg/dL, would have non-HDL goal of < 100 mg/dL)     Assessment and Plan :   PDMP not reviewed this encounter.  1. Constipation, unspecified constipation type   2. Generalized abdominal pain    Will pursue an outpatient x-ray, KUB, to rule out bowel obstruction.  Based off of his last GFR, recommend against an enema.  Advised using a stool softener and MiraLAX until he has a complete bowel movement.  Counseled patient on potential for adverse effects with medications prescribed/recommended today, ER and return-to-clinic precautions discussed, patient verbalized understanding.    Adolph Hoop, [place] 11/08/23 [id number]

## 2023-11-08 NOTE — Discharge Instructions (Addendum)
 I have placed orders to have an x-ray done at the med center in [facility].  Please had there now.  Go through the main hospital and not the emergency room.  Once you are there and let them know that you will came to our clinic and we send she to their facility for an outpatient x-ray.  If no one is at the front desk then they are likely out the rest of the day and at that point you would have to go through the emergency room.  Do not check in as a patient through the emergency room.  Simply let them know that you are there for an outpatient x-ray from our clinic.  I will call you with your results and update our treatment plan if necessary after I get the report.

## 2023-11-08 NOTE — ED Triage Notes (Signed)
 Patient states that he hasn't had a BM since Monday. Took dulcolax yesterday and today with no relief.

## 2023-11-18 ENCOUNTER — Telehealth (HOSPITAL_COMMUNITY): Payer: Self-pay | Admitting: *Deleted

## 2023-11-18 ENCOUNTER — Other Ambulatory Visit: Payer: Self-pay | Admitting: Internal Medicine

## 2023-11-18 DIAGNOSIS — E785 Hyperlipidemia, unspecified: Secondary | ICD-10-CM

## 2023-11-18 NOTE — Telephone Encounter (Signed)
 No changes were done on his last visit.

## 2023-11-18 NOTE — Telephone Encounter (Signed)
 Pt called with c/o feeling "shaky", with decreased appetite, and generally "not feeling well" due to "medication changes" he says. Pt was last seen on 11/07/23. F/u is scheduled for 02/13/24. Please review and advise.

## 2023-11-27 NOTE — Progress Notes (Deleted)
 NEUROLOGY FOLLOW UP OFFICE NOTE  Marcus Crawford [id number]  Subjective:  Marcus Crawford is a 73 y.o. year old male with a history of HTN, HLD, CKD, pre-diabetes, vit D deficiency, prostate cancer, bursitis of right hip, RBBB, Bell's Palsy c/b chronic right face droop, hypogonadism, and fatigue who we last saw on 06/06/23 for tremor and fatigue.  To briefly review: Initial consultation (04/16/22): Patient has been having progressive weakness since May or June of 2023. He first noticed that when he did anything, he would get tired very quickly. He denies weakness at first, but just wouldn't have energy to go very long. He would have to rest to get his energy up again. He may have started noticing his stride while walking was changing for the last year. He is not sure how he was walking different, but noticed he was.   Patient feels weak in his arms and legs. He mentioned that about August 2023 he noticed leg weakness and September 2023 he started noticing his arms felt funny and weak. He does not notice fluctuating of his weakness. He feels his strength is so poor and his fatigue is so much that he ends up laying around than being upright. He has lost 15-20 pounds over this time period as well. He says he has no appetite. He does not feel he can think straight. He feels like he has brain fog. He also feels like his vision is affected. He has dry eyes and feels like his vision is poor. He denies double vision. He also feels like he cannot sit still. He is restless. He mentions that his hand writing is poorer than previous and perhaps smaller.   He is sleeping very poorly. He takes ambien  to help him sleep but he is still only sleeping from 9:30pm to 3:30am (~6 hours per night). He feels rested but only for a short period of time. This has been going on for about 2 months. He thinks he snores a lot. He had a sleep study scheduled a couple of weeks ago, but this was not completed because per patient, he  wasn't in good enough health, so it was cancelled.    He endorses poor mood as well. His is upset about not only his condition but also his wife. She is going through cancer treatments.   Patient states he feels so bad that he feels like he is dying.   Current MG like symptoms: Ptosis: He thinks he occasionally has droopy eyelids, but maybe this has been going on for a long time. Double vision: Not clear Speech: Patient feels like his voice is changing. He feels hoarse. This comes and goes. He thinks it is worse when he gets upset or if he is tired or using his voice a lot. Chewing: None Swallowing: None Breathing: None Arm strength: Feels weak. Harder to brush teeth. Not dropping things.  Leg strength: Feels weak. Feels like he is taking shorter chopper steps per patient.    He currently takes vitamin D  1000 IU per day for several years. He takes no other vitamins.   The patient has not had similar episodes of symptoms in the past.    He endorses some tingling on the bottom of his feet occasionally and has occasional cramping.    Any change in urine color, especially after exertion/physical activity? No   Pseudobulbar affect is absent, but patient does endorse being more emotional.   The patient has not  noticed any recent skin  rashes nor does he report any constitutional symptoms like fever, night sweats. He endorses anorexia and weight loss as above.   EtOH use: None  Restrictive diet? No Family history of neuropathy/myopathy/NM disease? None   Patient presented to the ED on 04/10/22 for weakness. Per documentation, symptoms have been present for the last year, but progressed over the last month. He has lost 15 pounds over the last few months due to poor appetite. He feels very fatigued. There was concern for myasthenia gravis, so neurology was consulted. Neurology was not concerned for MG crisis, so sent the antibodies and recommended outpatient follow up.   04/24/22: AchR  binding abs and striated muscle abs were negative. B1 was elevated to 45.   Patient continues to feel very fatigued and weak. We spoke by phone on 04/23/22, and patient was very concerned about his symptoms, so this follow up appointment was made.   Patient today says he does not feel depressed. He was prescribed a medication for depression but did not take it.   He also has poor sleep. He was given ambien  which he took last night and slept 7 hours. He is worried about getting addicted to the medication though.   11/14/22: Labs returned normal. Patient cancelled EMG.    He did see sleep medicine. He is still awaiting CPAP.   He saw his PCP on 11/06/22 with similar complaints of fatigue, daytime sleepiness, apathy, anhedonia, and feeling shaky. Course tremors were noted, so patient was referred back to me today.    Patient mentions that he has noticed this more with stress. He notices it occurs with action. When he is putting down his glasses or eating cereal. When he thinks about it, he will notice it more (like thinking about it makes it worse). He was recently restarted on methylphenidate  and thinks this has helped how he is feeling and tremors.   He denies freezing. He denies rest tremor. He denies significant imbalance or falls. He endorses poor smell for 20-30 years. He may have had some kicking in his sleep in the past, but nothing currently that sounds like REM sleep behavior.   He has no family history of tremor.   Relevant current medications: -Cymbalta  60 mg daily (tremor in 2-3%) -Methylphenidate  18 mg daily (jitteriness in 4%, tremor in 3%) -Seroquel  25 mg daily (parkinsonism in < 6%, tremor in 2%) -Trazodone  50 mg daily (nervousness in 15%, tremor in 3-5%)  06/06/23: Patient saw psych who did not think patient had depressive symptoms but wondered about a viral infection per patient.    He continues to feel very low energy. He feels a pressure in his chest and arms as well. He  has brain fog. Symptoms started around 11/2021. He does not remember any infection or insect bites.   Patient continues to have occasional tremor, but only when stressed. His wife is going through bone cancer treatment, so he has more responsibilities. The tremor does not affect eating or drinking too much.  Most recent Assessment and Plan (06/06/23): This is SHILO PAUWELS, a 73 y.o. male with chronic fatigue, occasional tremor with movement or stress, generalized feeling of being unwell. I have seen patient since 04/16/22 from similar complaints without clear etiology. MRI thoracic spine from 03/2023 showed lesions in T6 and T12 vertebrae that are being worked up further to rule out metastatic or primary bone lesions. This is the most concerning potential etiology. Patient has flat affect and decreased motivation but per patient and  psychiatry, he does not have depressive symptoms. He has not improved on Cymbalta  and Seroquel . Parkinsonism is also possible, but patient does not have bradykinesia required for the diagnosis. I will continue to monitor for this. There was concern for a viral infection, so I will recheck labs with Lyme and EBV today.    Plan: -Blood work: Lyme, EBV, B12 -Follow up MRI thoracic spine as planned on 06/20/23 -Continue Cymbalta  60 mg daily -Continue CPAP for OSA  Since their last visit: B12 was borderline low so I recommended B12 1000 mcg daily. Lyme was negative. EBV testing showed remote infection, not active. ***  MRI thoracic spine was unremarkable.  ***  Tremors?   MEDICATIONS:  Outpatient Encounter Medications as of 12/05/2023  Medication Sig   cholecalciferol (VITAMIN D ) 1000 UNITS tablet Take 2,000 Units by mouth daily. Taking 2000 units daily   cyanocobalamin  (VITAMIN B12) 1000 MCG tablet Take 1,000 mcg by mouth daily.   docusate sodium  (COLACE) 100 MG capsule Take 1 capsule (100 mg total) by mouth every 12 (twelve) hours.   DULoxetine  (CYMBALTA ) 30 MG  capsule Take 1 capsule (30 mg total) by mouth in the morning.   levocetirizine (XYZAL ) 5 MG tablet TAKE ONE TABLET BY MOUTH EVERY EVENING   olmesartan  (BENICAR ) 20 MG tablet TAKE 1 TABLET(20 MG) BY MOUTH DAILY   polyethylene glycol powder (MIRALAX ) 17 GM/SCOOP powder Take 17 g by mouth daily as needed for severe constipation or moderate constipation.   Polyvinyl Alcohol-Povidone (REFRESH OP) Place 1 drop into both eyes 4 (four) times daily as needed (dryness).   pravastatin  (PRAVACHOL ) 10 MG tablet TAKE 1 TABLET(10 MG) BY MOUTH DAILY   QUEtiapine  (SEROQUEL ) 25 MG tablet Take 1 tablet (25 mg total) by mouth at bedtime.   RABEprazole  (ACIPHEX ) 20 MG tablet TAKE ONE TABLET BY MOUTH ONE TIME DAILY   traZODone  (DESYREL ) 50 MG tablet Take 1 tablet (50 mg total) by mouth at bedtime.   No facility-administered encounter medications on file as of 12/05/2023.    PAST MEDICAL HISTORY: Past Medical History:  Diagnosis Date   Anemia    early 20's   Bell's palsy 1977   Blood clot in vein 2005   left leg "blood clot"-treated as OP; no residual   Blood transfusion without reported diagnosis    with appendectomy   Borderline diabetic    no meds   Cancer (HCC) 08/2013   prostate/ had surgery/ no chemo or radiation   Cataract    forming   CKD (chronic kidney disease) stage 3, GFR 30-59 ml/min (HCC) saw dr Arlis Lakes 6 months ago   on rampiril for kidney function also   Colonic polyp    X3 ,hyperplastic   Diverticulosis of colon    GERD (gastroesophageal reflux disease)    H/O hiatal hernia    Hemorrhoid    Hyperlipidemia    Hypertension    past hx - no meds since 2013 with  adrenal gland surgery   Kidney stone on left side 2013   asymptomatic , incidental finding   Migraine last 6-7 yrs ago   PMH of ; resolved post adrenal adenoma resection   Nephrolithiasis 2013   kidney stone  as incidental finding on imaging   PONV (postoperative nausea and vomiting)     PAST SURGICAL HISTORY: Past Surgical  History:  Procedure Laterality Date   adrenal venous sampling  03-2012   ADRENALECTOMY  04/14/2012   Procedure: ADRENALECTOMY;  Surgeon: Shela Derby, MD;  Location: [facility];  Service: General;  Laterality: Left;  Left Adrenal Excision   APPENDECTOMY  1985   COLONOSCOPY     last 9/12, Dr Sandrea Cruel; due 2017   ESOPHAGEAL DILATION  2001   Dr [employer]   POLYPECTOMY      X 3   ROBOT ASSISTED LAPAROSCOPIC RADICAL PROSTATECTOMY N/A 09/07/2013   Procedure: ROBOTIC ASSISTED LAPAROSCOPIC RADICAL PROSTATECTOMY LEVEL 1;  Surgeon: Kristeen Peto, MD;  Location: [facility];  Service: Urology;  Laterality: N/A;    ALLERGIES: Allergies  Allergen Reactions   Penicillins Rash    Rash (RN clarified with pt) No SOB/swelling   Sulfa Antibiotics Rash    FAMILY HISTORY: Family History  Problem Relation Age of Onset   Rectal cancer Mother    Colon cancer Mother 4   Heart attack Mother 59   Colon polyps Sister    Lung cancer Sister        smoker   Diabetes Sister    Breast cancer Sister    Alzheimer's disease Sister    Heart failure Brother    Kidney cancer Brother    Prostate cancer Brother 38   Heart disease Brother    Hypertension Brother    Kidney cancer Brother    Diabetes Brother    Heart attack Brother 24   Esophageal cancer Maternal Uncle    Kidney cancer Maternal Uncle    Stomach cancer Neg Hx     SOCIAL HISTORY: Social History   Tobacco Use   Smoking status: Never   Smokeless tobacco: Never  Vaping Use   Vaping status: Never Used  Substance Use Topics   Alcohol use: Not Currently   Drug use: No   Social History   Social History Narrative   Right handed        Are you currently employed ?    What is your current occupation?retired   Do you live at home alone?   Who lives with you?    What type of home do you live in: 1 story or 2 story?    Caffiene  1 daily      Objective:  Vital Signs:  There were no vitals taken for this visit.  ***  Labs and Imaging  review: New results: 06/06/23: B12: 320 Lyme negative EBV IgM negative, IgG elevated  Folate (06/27/23) wnl  09/30/23: Lipid panel: tChol 152, LDL 68, TG 210 TSH wnl HbA1c: 6.0 Hepatic function panel wnl CBC w/ diff unremarkable BMP significant for glucose 129, BUN 28, Cr 1.68 (all chronic)  MRI thoracic spine wo contrast (06/20/23): IMPRESSION: 1. No change in the appearance of the thoracic spine since the prior exam. Mild thoracic spondylosis without central canal or foraminal narrowing. 2. Stable appearance of a T2 hyperintense lesion in the right T6 pedicle and a punctate T2 hyperintense lesion in T12. 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 No follow-up imaging is recommend.  Previously reviewed results: 02/06/23: BMP significant for glucose 118, Cr 1.49 CBC w/ diff unremarkable Hepatic function panel unremarkable   11/06/22: BMP unremarkable TSH wnl HbA1c: 5.9   Vit D (06/15/22): 48.9  04/24/22: MuSK abs negative AChR blocking abs negative, modulating abs negative   B1: 45 AChR binding abs: negative Striated muscle abs: negative   Normal or unremarkable: CK (325), Mg, TSH, free T4, CBC, Vit D Testosterone : low at 132 (264 is lower limit of normal) BMP with mildly elevated glucose, elevated Cr (  1.41) HbA1c: 6.0 B12: 409    MRI brain wo contrast (04/11/22): IMPRESSION: No acute intracranial process. No evidence of acute or subacute infarct.   MRI cervical spine wo contrast (04/11/22): IMPRESSION: 1. No acute abnormality of the cervical spine. 2. Mild bilateral neural foraminal stenosis at C3-4. 3. No spinal canal stenosis  MRI thoracic spine wo contrast (03/07/23): FINDINGS: Alignment: Mildly exaggerated thoracic kyphosis. Slight grade 1 anterolisthesis at T2-T3 and T3-T4.   Vertebrae: Multilevel Schmorl nodes. This includes Schmorl nodes within the T7 inferior endplate and T8  superior endplate which have associated marrow edema. Mild degenerative endplate edema elsewhere at T7-T8. 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 Indeterminate 12 mm T2 hyperintense and T1 hypointense lesion within the right T6 pedicle, pars and transverse process (for instance as seen on series 18, image 5) (series 20, image 18). Indeterminate 8 mm T2 hyperintense and T1 hypointense lesion within the right aspect of the T12 vertebral body (for instance as seen on series 18, image 6). Multilevel ventrolateral osteophytes within the lower thoracic spine and at T12-L1.   Cord: No signal abnormality identified within the thoracic spinal cord.   Paraspinal and other soft tissues: No acute finding within included portions of the thorax or upper abdomen/retroperitoneum. No paraspinal mass or collection.   Disc levels:   Multilevel thoracic disc degeneration, greatest at T7-T8 (mild-to-moderate) and T12-L1 (moderate). Slight disc bulge at T10-T11 without significant spinal canal stenosis. No significant disc herniation or spinal canal stenosis elsewhere within the thoracic spine. Multilevel facet arthrosis. Facet spurring results in moderate left neural foraminal narrowing at T8-T9. No more than mild neural foraminal narrowing elsewhere within the thoracic spine.   Cervical spondylosis is incompletely assessed on localizer imaging.   Impression #1 will be called to the ordering clinician or representative by the Radiologist Assistant, and communication documented in the PACS or [employer].   IMPRESSION: 1. Indeterminate osseous lesions within the T6 and T12 vertebrae, as described. While these could reflect atypical hemangiomas, the imaging features are nonspecific and alternative etiologies (such as other primary bone lesion or osseous metastatic disease) cannot be excluded. A short-interval follow-up MRI in 2-3 months is recommended to ensure stability. 2.  Thoracic spondylosis as outlined. Disc degeneration is greatest at T7-T8 (mild-to-moderate) and T12-L1 (moderate). There is marrow edema associated with small Schmorl nodes at the T7-T8 level, and these Schmorl nodes may be recent. Mild degenerative endplate edema elsewhere at the T7-T8 level. No significant spinal canal stenosis. Multilevel foraminal stenosis, greatest on the left at T8-T9 (moderate at this site).  Assessment/Plan:  This is Burnell Carry, a 73 y.o. male with: ***   Plan: ***  Return to clinic in ***  Total time spent reviewing records, interview, history/exam, documentation, and coordination of care on day of encounter:  *** min  Rommie Coats, MD

## 2023-11-29 ENCOUNTER — Other Ambulatory Visit: Payer: Self-pay | Admitting: Internal Medicine

## 2023-11-29 DIAGNOSIS — I10 Essential (primary) hypertension: Secondary | ICD-10-CM

## 2023-12-02 DIAGNOSIS — H04123 Dry eye syndrome of bilateral lacrimal glands: Secondary | ICD-10-CM | POA: Diagnosis not present

## 2023-12-02 DIAGNOSIS — H524 Presbyopia: Secondary | ICD-10-CM | POA: Diagnosis not present

## 2023-12-02 DIAGNOSIS — H2513 Age-related nuclear cataract, bilateral: Secondary | ICD-10-CM | POA: Diagnosis not present

## 2023-12-05 ENCOUNTER — Ambulatory Visit: Payer: Medicare Other | Admitting: Neurology

## 2023-12-06 ENCOUNTER — Encounter: Payer: Self-pay | Admitting: Family Medicine

## 2023-12-06 ENCOUNTER — Ambulatory Visit (INDEPENDENT_AMBULATORY_CARE_PROVIDER_SITE_OTHER): Admitting: Family Medicine

## 2023-12-06 VITALS — BP 118/64 | HR 76 | Temp 97.6°F | Ht 72.0 in | Wt 193.0 lb

## 2023-12-06 DIAGNOSIS — I10 Essential (primary) hypertension: Secondary | ICD-10-CM | POA: Diagnosis not present

## 2023-12-06 DIAGNOSIS — G8929 Other chronic pain: Secondary | ICD-10-CM | POA: Diagnosis not present

## 2023-12-06 DIAGNOSIS — I4510 Unspecified right bundle-branch block: Secondary | ICD-10-CM

## 2023-12-06 DIAGNOSIS — F322 Major depressive disorder, single episode, severe without psychotic features: Secondary | ICD-10-CM | POA: Diagnosis not present

## 2023-12-06 DIAGNOSIS — G9332 Myalgic encephalomyelitis/chronic fatigue syndrome: Secondary | ICD-10-CM

## 2023-12-06 DIAGNOSIS — M545 Low back pain, unspecified: Secondary | ICD-10-CM | POA: Diagnosis not present

## 2023-12-06 DIAGNOSIS — G252 Other specified forms of tremor: Secondary | ICD-10-CM

## 2023-12-06 DIAGNOSIS — E785 Hyperlipidemia, unspecified: Secondary | ICD-10-CM | POA: Diagnosis not present

## 2023-12-06 DIAGNOSIS — G4733 Obstructive sleep apnea (adult) (pediatric): Secondary | ICD-10-CM

## 2023-12-06 DIAGNOSIS — E559 Vitamin D deficiency, unspecified: Secondary | ICD-10-CM

## 2023-12-06 NOTE — Progress Notes (Signed)
 Assessment & Plan   Assessment/Plan:     Assessment and Plan Assessment & Plan Chronic Fatigue Syndrome Chronic fatigue has persisted for over two years. Extensive workup, including metabolic, renal, hepatic, thyroid , and prostate testing, was unremarkable. [name] virus IgG was positive, indicating past infection, but not necessarily the cause of current fatigue. Other potential causes like autoimmune, cardiac, and respiratory conditions have been considered. He has been on duloxetine , trazodone , and Seroquel , which may affect mood and sleep. He has experienced weight loss and sleep disturbances. Discussed the possibility of chronic fatigue syndrome and the challenges in diagnosing it due to the mixed nature of symptoms and extensive prior evaluations. - Refer to a cardiologist for further evaluation of cardiac causes of fatigue. - Encourage structured physical activity, possibly in a group setting, to help with fatigue and mood. - Discuss the potential benefits of CoQ10 and magnesium  supplements. - Consider aqua therapy to alleviate back pain and improve mobility. - Discuss therapy or support groups for chronic pain and fatigue management.  Chronic Back Pain Chronic back pain has been present for many years, with flare-ups occurring after the onset of fatigue. He has been seeing a pain specialist and has been on duloxetine , which is also used for chronic back pain. Discussed the potential benefits of structured physical activity and aqua therapy for managing back pain. - Continue duloxetine  30 mg daily for chronic back pain management. - Consider aqua therapy to alleviate back pain and improve mobility.  Tremors Reports experiencing tremors, particularly in the morning. Neurology has ruled out Parkinson's disease. Seroquel  can cause movement-related side effects, but no definitive cause for the tremors has been identified. - Consult neurology for further evaluation of tremors if  symptoms persist.  General Health Maintenance He is on pravastatin  for cholesterol management and takes vitamin D  and magnesium  supplements. Blood pressure is well-controlled without medication. Vitamin B12 deficiency is currently well-managed. - Continue pravastatin  for cholesterol management. - Continue vitamin D  and magnesium  supplements. - Monitor blood pressure regularly.  Follow-up Scheduled to see a gastroenterologist in a couple of weeks. Will review his case further and consider additional options for managing chronic fatigue. - Follow up with gastroenterologist as scheduled. - Consider follow-up in a couple of months for further evaluation and management of chronic fatigue.      There are no discontinued medications.  Return in about 2 months (around 02/05/2024) for fatigue.        Subjective:   Encounter date: 12/06/2023  Marcus Crawford is a 73 y.o. 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   He  has a past medical history of Anemia, Bell's palsy (1977), Blood clot in vein (2005), Blood transfusion without reported diagnosis, Borderline diabetic, Cancer (HCC) (08/2013), Cataract, CKD (chronic kidney disease) stage 3, GFR 30-59 ml/min (HCC) (saw dr webb 6 months ago), Colonic polyp, Diverticulosis of colon, GERD  (gastroesophageal reflux disease), H/O hiatal hernia, Hemorrhoid, Hyperlipidemia, Hypertension, Kidney stone on left side (2013), Migraine (last  6-7 yrs ago), Nephrolithiasis (2013), and PONV (postoperative nausea and vomiting).Ison Wichmann Aas   He presents with chief complaint of New Patient (Initial Visit) (TOC- Jones, Thomas/Want to discuss chronic fatigue but hard to sleep sometimes) .   Discussed the use of AI scribe software for clinical note transcription with the patient, who gave verbal consent to proceed.  History of Present Illness [name] "Marcus Crawford" is a 73 year old male who presents with chronic fatigue.  He has been experiencing chronic fatigue since May 2023, which worsens after physical activity and requires rest before resuming activities. Initially, depression was suspected, but he disagreed with this assessment. Evaluation by a urologist revealed past exposure to Epstein-Barr virus, yet significant fatigue persists, impacting daily life.  He is on duloxetine  30 mg daily, trazodone , and Seroquel  for sleep and mood stabilization. Attempts to taper off duloxetine  led to a relapse of symptoms, necessitating an increase back to 30 mg daily. These medications have improved sleep, but daytime fatigue remains, and he cannot nap as before.  He has a history of chronic back pain, with flare-ups occurring after the onset of fatigue. He experiences tremors, particularly in the mornings, which have been evaluated and are not associated with Parkinson's disease or other neurological symptoms.  Extensive workup for fatigue, including metabolic panels, kidney and liver function tests, thyroid  function tests, and screenings for infections and autoimmune conditions, have been stable. Past evaluations by cardiology and pulmonology revealed no significant findings related to fatigue.  He has sleep apnea and has undergone sleep studies. No recent chest pain or shortness of breath. He has lost approximately 27  pounds since the onset of symptoms, attributing some weight loss to poor health and inability to sleep.  He tries to maintain physical activity by walking and doing exercises, particularly for his back, but energy levels are significantly reduced compared to before the onset of symptoms.     09/30/2023    1:03 PM 06/27/2023    1:56 PM 03/21/2023    3:18 PM 01/07/2023   10:51 AM 11/06/2022    1:04 PM  Depression screen PHQ 2/9  Decreased Interest 0 0 0 0 0  Down, Depressed, Hopeless 0 0 0 0 0  PHQ - 2 Score 0 0 0 0 0  Altered sleeping 0 0 1 0 1  Tired, decreased energy 0 3 3 3 3   Change in appetite 0 0 0 0 0  Feeling bad or failure about yourself  0 0 0 1 0  Trouble concentrating 0 0 0 0 0  Moving slowly or fidgety/restless 0 0 0 1 0  Suicidal thoughts 0 0 0 0 0  PHQ-9 Score 0 3 4 5 4   Difficult doing work/chores Not difficult at all Not difficult at all Somewhat difficult Somewhat difficult       06/27/2023    1:56 PM 03/21/2023    3:18 PM  GAD 7 : Generalized Anxiety Score  Nervous, Anxious, on Edge 1 1  Control/stop worrying 0 0  Worry too much - different things 0 0  Trouble relaxing 0 0  Restless 0 0  Easily annoyed or irritable 0 0  Afraid - awful might happen 0 0  Total GAD 7 Score 1 1  Anxiety Difficulty Not difficult at all        ROS  Past Surgical History:  Procedure Laterality Date   adrenal venous sampling  03-2012   ADRENALECTOMY  04/14/2012   Procedure: ADRENALECTOMY;  Surgeon: Shela Derby, MD;  Location: [facility];  Service: General;  Laterality: Left;  Left Adrenal Excision   APPENDECTOMY  1985   COLONOSCOPY     last 9/12, Dr Sandrea Cruel; due 2017   ESOPHAGEAL DILATION  2001   Dr [employer]   POLYPECTOMY      X 3   ROBOT ASSISTED LAPAROSCOPIC RADICAL PROSTATECTOMY N/A 09/07/2013   Procedure: ROBOTIC ASSISTED LAPAROSCOPIC RADICAL PROSTATECTOMY LEVEL 1;  Surgeon: Kristeen Peto, MD;  Location: [facility];  Service: Urology;  Laterality: N/A;    Outpatient Medications  Prior to Visit  Medication Sig Dispense Refill   cholecalciferol (VITAMIN D ) 1000 UNITS tablet Take 2,000 Units by mouth daily. Taking 2000 units daily     co-enzyme Q-10 30 MG capsule Take 100 mg by mouth 2 (two) times daily.     cyanocobalamin  (VITAMIN B12) 1000 MCG tablet Take 1,000 mcg by mouth daily.     DULoxetine  (CYMBALTA ) 30 MG capsule Take 1 capsule (30 mg total) by mouth in the morning. 90 capsule 0   magnesium  citrate solution Take 296 mLs by mouth once.     pravastatin  (PRAVACHOL ) 10 MG tablet TAKE 1 TABLET(10 MG) BY MOUTH DAILY 90 tablet 0   QUEtiapine  (SEROQUEL ) 25 MG tablet Take 1 tablet (25 mg total) by mouth at bedtime. 90 tablet 0   RABEprazole  (ACIPHEX ) 20 MG tablet TAKE ONE TABLET BY MOUTH ONE TIME DAILY 90 tablet 1   traZODone  (DESYREL ) 50 MG tablet Take 1 tablet (50 mg total) by mouth at bedtime. 90 tablet 0   docusate sodium  (COLACE) 100 MG capsule Take 1 capsule (100 mg total) by mouth every 12 (twelve) hours. (Patient not taking: Reported on 12/06/2023) 60 capsule 0   levocetirizine (XYZAL ) 5 MG tablet TAKE ONE TABLET BY MOUTH EVERY EVENING (Patient not taking: Reported on 12/06/2023) 90 tablet 1   olmesartan  (BENICAR ) 20 MG tablet TAKE 1 TABLET(20 MG) BY MOUTH DAILY (Patient not taking: Reported on 12/06/2023) 90 tablet 0   polyethylene glycol powder (MIRALAX ) 17 GM/SCOOP powder Take 17 g by mouth daily as needed for severe constipation or moderate constipation. (Patient not taking: Reported on 12/06/2023) 255 g 0   Polyvinyl Alcohol-Povidone (REFRESH OP) Place 1 drop into both eyes 4 (four) times daily as needed (dryness).     No facility-administered medications prior to visit.    Family History  Problem Relation Age of Onset   Rectal cancer Mother    Colon cancer Mother 67   Heart attack Mother 14   Colon polyps Sister    Lung cancer Sister        smoker   Diabetes Sister    Breast cancer Sister    Alzheimer's disease Sister    Heart failure Brother    Kidney  cancer Brother    Prostate cancer Brother 64   Heart disease Brother    Hypertension Brother    Kidney cancer Brother    Diabetes Brother    Heart attack Brother 23   Esophageal cancer Maternal Uncle    Kidney cancer Maternal Uncle    Stomach cancer Neg Hx     Social History   Socioeconomic History   Marital status: Married    Spouse name: Abran Abrahams   Number of children: 0   Years of education: Not on file   Highest education level: Some college, no degree  Occupational History   Occupation: retired  Tobacco Use   Smoking status: Never   Smokeless tobacco: Never  Building services engineer  status: Never Used  Substance and Sexual Activity   Alcohol use: Not Currently   Drug use: No   Sexual activity: Yes  Other Topics Concern   Not on file  Social History Narrative   Right handed        Are you currently employed ?    What is your current occupation?retired   Do you live at home alone?   Who lives with you?    What type of home do you live in: 1 story or 2 story?    Caffiene  1 daily   Social Drivers of Corporate investment banker Strain: Low Risk  (06/25/2023)   Overall Financial Resource Strain (CARDIA)    Difficulty of Paying Living Expenses: Not hard at all  Food Insecurity: No Food Insecurity (06/25/2023)   Hunger Vital Sign    Worried About Running Out of Food in the Last Year: Never true    Ran Out of Food in the Last Year: Never true  Transportation Needs: No Transportation Needs (05/11/2023)   PRAPARE - Administrator, Civil Service (Medical): No    Lack of Transportation (Non-Medical): No  Physical Activity: Insufficiently Active (06/25/2023)   Exercise Vital Sign    Days of Exercise per Week: 1 day    Minutes of Exercise per Session: 10 min  Stress: Stress Concern Present (05/11/2023)   [employer] of Occupational Health - Occupational Stress Questionnaire    Feeling of Stress : To some extent  Social Connections: Moderately Integrated  (06/25/2023)   Social Connection and Isolation Panel [NHANES]    Frequency of Communication with Friends and Family: More than three times a week    Frequency of Social Gatherings with Friends and Family: Twice a week    Attends Religious Services: Never    Database administrator or Organizations: Yes    Attends Banker Meetings: 1 to 4 times per year    Marital Status: Married  Catering manager Violence: Not At Risk (01/07/2023)   Humiliation, Afraid, Rape, and Kick questionnaire    Fear of Current or Ex-Partner: No    Emotionally Abused: No    Physically Abused: No    Sexually Abused: No                                                                                                  Objective:  Physical Exam: BP 118/64   Pulse 76   Temp 97.6 F (36.4 C)   Ht 6' (1.829 m)   Wt 193 lb (87.5 kg)   SpO2 96%   BMI 26.18 kg/m    Physical Exam GENERAL: Alert, cooperative, well developed, no acute distress HEENT: Normocephalic, normal oropharynx, moist mucous membranes CHEST: Clear to auscultation bilaterally, No wheezes, rhonchi, or crackles CARDIOVASCULAR: Normal heart rate and rhythm, S1 and S2 normal without murmurs ABDOMEN: Soft, non-tender, non-distended, without organomegaly, Normal bowel sounds EXTREMITIES: No cyanosis or edema NEUROLOGICAL: Cranial nerves grossly intact, Moves all extremities without gross motor or sensory deficit   Physical Exam  DG Abd 1 View Result Date:  11/08/2023 CLINICAL DATA:  Abdominal pain, no bowel movement EXAM: ABDOMEN - 1 VIEW COMPARISON:  July 15, 2013, April 30, 2022 FINDINGS: Decompressed stomach. No significantly dilated small bowel. Gaseous distention of the transverse colon. No pneumoperitoneum. No organomegaly or radiopaque calculi. No acute fracture or destructive lesion. The lung bases are clear. Multilevel degenerative disc disease of the spine. IMPRESSION: Nonobstructive bowel gas pattern. Electronically Signed    By: Rance Burrows M.D.   On: 11/08/2023 17:21    Recent Results (from the past 2160 hours)  TSH     Status: None   Collection Time: 09/30/23  1:34 PM  Result Value Ref Range   TSH 1.90 0.35 - 5.50 uIU/mL  Hemoglobin A1c     Status: None   Collection Time: 09/30/23  1:34 PM  Result Value Ref Range   Hgb A1c MFr Bld 6.0 4.6 - 6.5 %    Comment: Glycemic Control Guidelines for People with Diabetes:Non Diabetic:  <6%Goal of Therapy: <7%Additional Action Suggested:  >8%   Hepatic function panel     Status: None   Collection Time: 09/30/23  1:34 PM  Result Value Ref Range   Total Bilirubin 0.6 0.2 - 1.2 mg/dL   Bilirubin, Direct 0.1 0.0 - 0.3 mg/dL   Alkaline Phosphatase 63 39 - 117 U/L   AST 7 0 - 37 U/L   ALT 17 0 - 53 U/L   Total Protein 6.6 6.0 - 8.3 g/dL   Albumin 4.5 3.5 - 5.2 g/dL  CBC with Differential/Platelet     Status: None   Collection Time: 09/30/23  1:34 PM  Result Value Ref Range   WBC 7.2 4.0 - 10.5 K/uL   RBC 4.49 4.22 - 5.81 Mil/uL   Hemoglobin 13.9 13.0 - 17.0 g/dL   HCT 78.2 95.6 - 21.3 %   MCV 92.3 78.0 - 100.0 fl   MCHC 33.6 30.0 - 36.0 g/dL   RDW 08.6 57.8 - 46.9 %   Platelets 180.0 150.0 - 400.0 K/uL   Neutrophils Relative % 74.5 43.0 - 77.0 %   Lymphocytes Relative 16.6 12.0 - 46.0 %   Monocytes Relative 7.4 3.0 - 12.0 %   Eosinophils Relative 1.0 0.0 - 5.0 %   Basophils Relative 0.5 0.0 - 3.0 %   Neutro Abs 5.3 1.4 - 7.7 K/uL   Lymphs Abs 1.2 0.7 - 4.0 K/uL   Monocytes Absolute 0.5 0.1 - 1.0 K/uL   Eosinophils Absolute 0.1 0.0 - 0.7 K/uL   Basophils Absolute 0.0 0.0 - 0.1 K/uL  Basic metabolic panel with GFR     Status: Abnormal   Collection Time: 09/30/23  1:34 PM  Result Value Ref Range   Sodium 139 135 - 145 mEq/L   Potassium 4.2 3.5 - 5.1 mEq/L   Chloride 102 96 - 112 mEq/L   CO2 27 19 - 32 mEq/L   Glucose, Bld 129 (H) 70 - 99 mg/dL   BUN 28 (H) 6 - 23 mg/dL   Creatinine, Ser 6.29 (H) 0.40 - 1.50 mg/dL   GFR 52.84 (L) >13.24 mL/min     Comment: Calculated using the CKD-EPI Creatinine Equation (2021)   Calcium  9.6 8.4 - 10.5 mg/dL  Lipid panel     Status: Abnormal   Collection Time: 09/30/23  1:34 PM  Result Value Ref Range   Cholesterol 152 0 - 200 mg/dL    Comment: ATP III Classification       Desirable:  < 200 mg/dL  Borderline High:  200 - 239 mg/dL          High:  > = 161 mg/dL   Triglycerides 096.0 (H) 0.0 - 149.0 mg/dL    Comment: Normal:  <454 mg/dLBorderline High:  150 - 199 mg/dL   HDL 09.81 >19.14 mg/dL   VLDL 78.2 (H) 0.0 - 95.6 mg/dL   LDL Cholesterol 68 0 - 99 mg/dL   Total CHOL/HDL Ratio 4     Comment:                Men          Women1/2 Average Risk     3.4          3.3Average Risk          5.0          4.42X Average Risk          9.6          7.13X Average Risk          15.0          11.0                       NonHDL 110.28     Comment: NOTE:  Non-HDL goal should be 30 mg/dL higher than patient's LDL goal (i.e. LDL goal of < 70 mg/dL, would have non-HDL goal of < 100 mg/dL)        Carnell Christian, MD, MS

## 2023-12-06 NOTE — Patient Instructions (Signed)
  VISIT SUMMARY: Today, we discussed your ongoing chronic fatigue, which has been affecting you since May 2023. We reviewed your current medications and their effects, as well as your history of chronic back pain and tremors. We also talked about your recent weight loss and sleep disturbances. Extensive tests have been done, and we are considering chronic fatigue syndrome as a possible diagnosis. We also reviewed your general health maintenance and upcoming appointments.  YOUR PLAN: -CHRONIC FATIGUE SYNDROME: Chronic fatigue syndrome is a condition characterized by extreme tiredness that doesn't improve with rest and may worsen with physical or mental activity. We will refer you to a cardiologist to rule out any heart-related causes of your fatigue. It's important to engage in structured physical activity, possibly in a group setting, to help with your fatigue and mood. We also discussed the potential benefits of CoQ10 and magnesium  supplements. Aqua therapy might help alleviate your back pain and improve mobility. Additionally, therapy or support groups for chronic pain and fatigue management could be beneficial.  -CHRONIC BACK PAIN: Chronic back pain is long-lasting pain in your back that can flare up periodically. You should continue taking duloxetine  30 mg daily for managing your back pain. Aqua therapy is also recommended to help alleviate your back pain and improve your mobility.  -TREMORS: Tremors are involuntary shaking movements, often occurring in the morning. While Seroquel  can cause movement-related side effects, no definitive cause for your tremors has been identified. If your symptoms persist, we will consult neurology for further evaluation.  -GENERAL HEALTH MAINTENANCE: For your general health, continue taking pravastatin  for cholesterol management and your vitamin D  and magnesium  supplements. Your blood pressure is well-controlled without medication, and your vitamin B12 deficiency is  well-managed. Regular monitoring of your blood pressure is recommended.  INSTRUCTIONS: Please follow up with the gastroenterologist as scheduled in a couple of weeks. We will consider additional options for managing your chronic fatigue during your follow-up in a couple of months.

## 2023-12-18 NOTE — Progress Notes (Unsigned)
 12/19/2023 Marcus Crawford [id number] [birth year]  Referring provider: Arcadio Knuckles, MD Primary GI doctor: Dr. Venice Gillis ( Dr. Sandrea Cruel)  ASSESSMENT AND PLAN:  Constipation with abdominal discomfort x 1-2 years, worsening over the last year, straining with BM, sticky stools and can be 3-4 days between Bms, has bloating, decreased appetite No melena, no hematochezia, no AB pain No dysphagia, GERD, nausea or vomiting 04/30/2022 CT abdomen pelvis with contrast for weight loss unremarkable other than nonobstructive 1.1 cm left nephrolithiasis 11/08/2023 urgent care visit Hgb 13.9 WBC 7.2, normal thyroid  11/08/2023 KUB no obstruction He is straining a lot,  has not had a BM since Sunday no ABX, several medications that could cause constipation, decreased movement due to chronic fatigue - Recommend daily Miralax , starting with half a capful, adjusting for bloating as needed. - Consider Linzess trial if Miralax  is ineffective for bloating. - Discuss potential SIBO; consider empirical metronidazole for 10 days if symptoms persist or worsen, noting metronidazole's renal safety unless severe impairment exists. - Encourage increased physical activity and discuss potential benefits of physical therapy or cardio rehabilitation with primary care provider. - Consider repeating CT scan if significant symptom changes occur, such as discomfort, blood in stool, or increased nausea.  Family history of colon cancer 09/11/2021 colonoscopy for first-degree relative with colon rectal cancer internal hemorrhoids grade 1 otherwise unremarkable recall 5 years versus no repeat secondary age  CKD stage 3 GFR 39 on 09/30/2023  History of diverticulosis Will call if any symptoms. Add on fiber supplement, avoid NSAIDS, information given  History of prostate cancer  status post prostatectomy  Chronic fatigue syndrome Extensive workup with primary care including metabolic, renal, hepatic, thyroid , and prostate testing,  was unremarkable. [name] virus IgG was positive, indicating past infection, but not necessarily the cause of current fatigue.  Other potential causes like autoimmune, cardiac, and respiratory conditions have been considered. He has been on duloxetine , trazodone , and Seroquel , which may affect mood and sleep.   B12 supplementation Low B12 levels with current pill supplementation possibly insufficient due to absorption issues. Sublingual B12 recommended for better absorption. - Recommend switching to sublingual B12 supplementation for better absorption.  Follow-up Plan to monitor symptoms and adjust treatment as needed. - Schedule follow-up appointment in two months. - Order B12 level check to monitor supplementation effectiveness. - Advise contacting the office if symptoms change or concerns about treatment efficacy arise.  Patient Care Team: Catheryn Cluck, MD as PCP - General (Family Medicine) Florencio Hunting, MD as Consulting Physician (Urology) Jonathan Neighbor, [facility] (Inactive) as Pharmacist (Pharmacist) Devon Fogo, MD (Inactive) as Consulting Physician (Dermatology) Cindra Cree, MD as Consulting Physician (Ophthalmology) Ellene Gustin, MD as Consulting Physician (Neurology)  HISTORY OF PRESENT ILLNESS: 73 y.o. male with a past medical history listed below presents for evaluation of constipation.   Discussed the use of AI scribe software for clinical note transcription with the patient, who gave verbal consent to proceed.  History of Present Illness   Marcus Crawford is a 73 year old male who presents with changes in bowel habits and bloating.  He has experienced a change in bowel habits over the past couple of years, with worsening symptoms over the last year. Bowel movements occur every three to four days, starting with 'real sticky' stools that eventually become normal. No dark black stool or blood, but the initial part of the stool is dark brown. He  experiences significant bloating but no abdominal pain.  He  has a history of chronic fatigue and has lost about six pounds in the last three months, attributed to decreased appetite and fatigue. No nausea, vomiting, heartburn, or food getting caught. He has been less active over the past year or two due to fatigue, which he describes as overwhelming at times, preventing him from even walking to the mailbox.  He is currently on Seroquel  (quetiapine ) at night, trazodone  for sleep, and Cymbalta  (duloxetine ) for depression. He has been treated for depression for over a year, but was later told he does not have depression. He has a history of Epstein-Barr virus, which he was told could cause chronic fatigue. He is on a B12 supplement and uses a CPAP for sleep apnea.  He has not been on antibiotics in the last year and has not used Miralax  regularly due to concerns about his kidney disease. He reports having kidney disease, which he developed after an adrenal gland operation. He finds Miralax  helpful when used, but is unsure about daily use due to his kidney condition.  He has undergone extensive testing over the past two years, including a colonoscopy in March 2023 showing internal hemorrhoids and a CT scan in October 2023 for weight loss, which revealed a kidney stone but was otherwise unremarkable. He was evaluated for potential bowel blockage in May 2023 due to sticky stools and straining. He has seen neurology for concerns about Parkinson's and myasthenia gravis, but these were not confirmed.      He  reports that he has never smoked. He has never used smokeless tobacco. He reports that he does not currently use alcohol. He reports that he does not use drugs.  RELEVANT GI HISTORY, IMAGING AND LABS: Results   LABS B12: 320 (06/2023) Epstein-Barr virus: Positive  RADIOLOGY Abdominal CT: Kidney stone (04/2022)  DIAGNOSTIC Colonoscopy: Internal hemorrhoids (08/2021)      CBC    Component Value  Date/Time   WBC 7.2 09/30/2023 1334   RBC 4.49 09/30/2023 1334   HGB 13.9 09/30/2023 1334   HCT 41.4 09/30/2023 1334   PLT 180.0 09/30/2023 1334   MCV 92.3 09/30/2023 1334   MCH 30.7 05/02/2022 1552   MCHC 33.6 09/30/2023 1334   RDW 13.8 09/30/2023 1334   LYMPHSABS 1.2 09/30/2023 1334   MONOABS 0.5 09/30/2023 1334   EOSABS 0.1 09/30/2023 1334   BASOSABS 0.0 09/30/2023 1334   Recent Labs    02/06/23 1342 09/30/23 1334  HGB 13.9 13.9    CMP     Component Value Date/Time   NA 139 09/30/2023 1334   NA 139 06/15/2022 0000   K 4.2 09/30/2023 1334   CL 102 09/30/2023 1334   CO2 27 09/30/2023 1334   GLUCOSE 129 (H) 09/30/2023 1334   BUN 28 (H) 09/30/2023 1334   BUN 30 (A) 06/15/2022 0000   CREATININE 1.68 (H) 09/30/2023 1334   CREATININE 1.46 (H) 01/26/2020 0848   CALCIUM  9.6 09/30/2023 1334   PROT 6.6 09/30/2023 1334   ALBUMIN 4.5 09/30/2023 1334   AST 7 09/30/2023 1334   ALT 17 09/30/2023 1334   ALKPHOS 63 09/30/2023 1334   BILITOT 0.6 09/30/2023 1334   GFRNONAA 55 (L) 05/02/2022 1552   GFRNONAA 48 (L) 01/26/2020 0848   GFRAA 55 (L) 01/26/2020 0848      Latest Ref Rng & Units 09/30/2023    1:34 PM 02/06/2023    1:42 PM 06/15/2022   12:00 AM  Hepatic Function  Total Protein 6.0 - 8.3 g/dL 6.6  6.2  Albumin 3.5 - 5.2 g/dL 4.5  4.3  4.2      AST 0 - 37 U/L 7  8    ALT 0 - 53 U/L 17  17    Alk Phosphatase 39 - 117 U/L 63  72    Total Bilirubin 0.2 - 1.2 mg/dL 0.6  0.4    Bilirubin, Direct 0.0 - 0.3 mg/dL 0.1  0.1       This result is from an external source.      Current Medications:    Current Outpatient Medications (Cardiovascular):    olmesartan  (BENICAR ) 20 MG tablet, TAKE 1 TABLET(20 MG) BY MOUTH DAILY   pravastatin  (PRAVACHOL ) 10 MG tablet, TAKE 1 TABLET(10 MG) BY MOUTH DAILY  Current Outpatient Medications (Respiratory):    levocetirizine (XYZAL ) 5 MG tablet, TAKE ONE TABLET BY MOUTH EVERY EVENING   Current Outpatient Medications (Hematological):     cyanocobalamin  (VITAMIN B12) 1000 MCG tablet, Take 1,000 mcg by mouth daily.  Current Outpatient Medications (Other):    cholecalciferol (VITAMIN D ) 1000 UNITS tablet, Take 2,000 Units by mouth daily. Taking 2000 units daily   co-enzyme Q-10 30 MG capsule, Take 100 mg by mouth 2 (two) times daily.   cycloSPORINE (RESTASIS) 0.05 % ophthalmic emulsion, 1 drop 2 (two) times daily.   DULoxetine  (CYMBALTA ) 30 MG capsule, Take 1 capsule (30 mg total) by mouth in the morning.   magnesium  citrate solution, Take 296 mLs by mouth once.   polyethylene glycol powder (MIRALAX ) 17 GM/SCOOP powder, Take 17 g by mouth daily as needed for severe constipation or moderate constipation.   Polyvinyl Alcohol-Povidone (REFRESH OP), Place 1 drop into both eyes 4 (four) times daily as needed (dryness).   QUEtiapine  (SEROQUEL ) 25 MG tablet, Take 1 tablet (25 mg total) by mouth at bedtime.   RABEprazole  (ACIPHEX ) 20 MG tablet, TAKE ONE TABLET BY MOUTH ONE TIME DAILY   traZODone  (DESYREL ) 50 MG tablet, Take 1 tablet (50 mg total) by mouth at bedtime.   docusate sodium  (COLACE) 100 MG capsule, Take 1 capsule (100 mg total) by mouth every 12 (twelve) hours. (Patient not taking: Reported on 12/19/2023)  Medical History:  Past Medical History:  Diagnosis Date   Anemia    early 20's   Bell's palsy 1977   Blood clot in vein 2005   left leg blood clot-treated as OP; no residual   Blood transfusion without reported diagnosis    with appendectomy   Borderline diabetic    no meds   Cancer (HCC) 08/2013   prostate/ had surgery/ no chemo or radiation   Cataract    forming   CKD (chronic kidney disease) stage 3, GFR 30-59 ml/min (HCC) saw dr Arlis Lakes 6 months ago   on rampiril for kidney function also   Colonic polyp    X3 ,hyperplastic   Diverticulosis of colon    GERD (gastroesophageal reflux disease)    H/O hiatal hernia    Hemorrhoid    Hyperlipidemia    Hypertension    past hx - no meds since 2013 with   adrenal gland surgery   Kidney stone on left side 2013   asymptomatic , incidental finding   Migraine last 6-7 yrs ago   PMH of ; resolved post adrenal adenoma resection   Nephrolithiasis 2013   kidney stone  as incidental finding on imaging   PONV (postoperative nausea and vomiting)    Allergies:  Allergies  Allergen Reactions   Penicillins Rash    Rash (  RN clarified with pt) No SOB/swelling   Sulfa Antibiotics Rash     Surgical History:  He  has a past surgical history that includes Esophageal dilation (2001); Colonoscopy; Polypectomy; Adrenalectomy (04/14/2012); Appendectomy (1985); adrenal venous sampling (10-979); and Robot assisted laparoscopic radical prostatectomy (N/A, 09/07/2013). Family History:  His family history includes Alzheimer's disease in his sister; Breast cancer in his sister; Colon cancer (age of onset: 25) in his mother; Colon polyps in his sister; Diabetes in his brother and sister; Esophageal cancer in his maternal uncle; Heart attack (age of onset: 38) in his brother; Heart attack (age of onset: 29) in his mother; Heart disease in his brother; Heart failure in his brother; Hypertension in his brother; Kidney cancer in his brother, brother, and maternal uncle; Lung cancer in his sister; Prostate cancer (age of onset: 17) in his brother; Rectal cancer in his mother.  REVIEW OF SYSTEMS  : All other systems reviewed and negative except where noted in the History of Present Illness.  PHYSICAL EXAM: BP 122/72   Pulse 82   Ht 6' (1.829 m)   Wt 194 lb (88 kg)   BMI 26.31 kg/m  Physical Exam   GENERAL APPEARANCE: Well nourished, in no apparent distress. HEENT: No cervical lymphadenopathy, unremarkable thyroid , sclerae anicteric, conjunctiva pink. RESPIRATORY: Respiratory effort normal, breath sounds equal bilaterally without rales, rhonchi, or wheezing. CARDIO: Regular rate and rhythm with no murmurs, rubs, or gallops, peripheral pulses intact. ABDOMEN: Soft,  non-distended, active bowel sounds in all four quadrants, no tenderness to palpation, no rebound, no mass appreciated, no hernia detected on palpation. RECTAL: Declines. MUSCULOSKELETAL: Full range of motion, normal gait, without edema. SKIN: Dry, intact without rashes or lesions. No jaundice. NEURO: Alert, oriented, no focal deficits. PSYCH: Cooperative, normal mood and affect.      Edmonia Gottron, PA-C 10:21 AM

## 2023-12-19 ENCOUNTER — Other Ambulatory Visit

## 2023-12-19 ENCOUNTER — Ambulatory Visit: Admitting: Physician Assistant

## 2023-12-19 ENCOUNTER — Encounter: Payer: Self-pay | Admitting: Physician Assistant

## 2023-12-19 VITALS — BP 122/72 | HR 82 | Ht 72.0 in | Wt 194.0 lb

## 2023-12-19 DIAGNOSIS — G9332 Myalgic encephalomyelitis/chronic fatigue syndrome: Secondary | ICD-10-CM | POA: Diagnosis not present

## 2023-12-19 DIAGNOSIS — Z9079 Acquired absence of other genital organ(s): Secondary | ICD-10-CM | POA: Diagnosis not present

## 2023-12-19 DIAGNOSIS — E538 Deficiency of other specified B group vitamins: Secondary | ICD-10-CM | POA: Diagnosis not present

## 2023-12-19 DIAGNOSIS — N183 Chronic kidney disease, stage 3 unspecified: Secondary | ICD-10-CM | POA: Diagnosis not present

## 2023-12-19 DIAGNOSIS — D649 Anemia, unspecified: Secondary | ICD-10-CM

## 2023-12-19 DIAGNOSIS — K5904 Chronic idiopathic constipation: Secondary | ICD-10-CM | POA: Diagnosis not present

## 2023-12-19 DIAGNOSIS — Z800 Family history of malignant neoplasm of digestive organs: Secondary | ICD-10-CM

## 2023-12-19 DIAGNOSIS — D8989 Other specified disorders involving the immune mechanism, not elsewhere classified: Secondary | ICD-10-CM | POA: Diagnosis not present

## 2023-12-19 DIAGNOSIS — K2100 Gastro-esophageal reflux disease with esophagitis, without bleeding: Secondary | ICD-10-CM

## 2023-12-19 DIAGNOSIS — K5900 Constipation, unspecified: Secondary | ICD-10-CM | POA: Diagnosis not present

## 2023-12-19 DIAGNOSIS — Z8719 Personal history of other diseases of the digestive system: Secondary | ICD-10-CM | POA: Diagnosis not present

## 2023-12-19 DIAGNOSIS — Z8546 Personal history of malignant neoplasm of prostate: Secondary | ICD-10-CM | POA: Diagnosis not present

## 2023-12-19 LAB — CBC WITH DIFFERENTIAL/PLATELET
Basophils Absolute: 0 10*3/uL (ref 0.0–0.1)
Basophils Relative: 0.9 % (ref 0.0–3.0)
Eosinophils Absolute: 0.1 10*3/uL (ref 0.0–0.7)
Eosinophils Relative: 1.5 % (ref 0.0–5.0)
HCT: 42.5 % (ref 39.0–52.0)
Hemoglobin: 14.1 g/dL (ref 13.0–17.0)
Lymphocytes Relative: 19.7 % (ref 12.0–46.0)
Lymphs Abs: 1.1 10*3/uL (ref 0.7–4.0)
MCHC: 33.2 g/dL (ref 30.0–36.0)
MCV: 89.8 fl (ref 78.0–100.0)
Monocytes Absolute: 0.4 10*3/uL (ref 0.1–1.0)
Monocytes Relative: 7.7 % (ref 3.0–12.0)
Neutro Abs: 3.9 10*3/uL (ref 1.4–7.7)
Neutrophils Relative %: 70.2 % (ref 43.0–77.0)
Platelets: 169 10*3/uL (ref 150.0–400.0)
RBC: 4.73 Mil/uL (ref 4.22–5.81)
RDW: 13.2 % (ref 11.5–15.5)
WBC: 5.5 10*3/uL (ref 4.0–10.5)

## 2023-12-19 LAB — COMPREHENSIVE METABOLIC PANEL WITH GFR
ALT: 19 U/L (ref 0–53)
AST: 10 U/L (ref 0–37)
Albumin: 4.4 g/dL (ref 3.5–5.2)
Alkaline Phosphatase: 61 U/L (ref 39–117)
BUN: 24 mg/dL — ABNORMAL HIGH (ref 6–23)
CO2: 30 meq/L (ref 19–32)
Calcium: 9.5 mg/dL (ref 8.4–10.5)
Chloride: 105 meq/L (ref 96–112)
Creatinine, Ser: 1.56 mg/dL — ABNORMAL HIGH (ref 0.40–1.50)
GFR: 43.23 mL/min — ABNORMAL LOW (ref >60.00)
Glucose, Bld: 107 mg/dL — ABNORMAL HIGH (ref 70–99)
Potassium: 4.4 meq/L (ref 3.5–5.1)
Sodium: 141 meq/L (ref 135–145)
Total Bilirubin: 0.5 mg/dL (ref 0.2–1.2)
Total Protein: 6.5 g/dL (ref 6.0–8.3)

## 2023-12-19 LAB — IBC + FERRITIN
Ferritin: 86.6 ng/mL (ref 22.0–322.0)
Iron: 126 ug/dL (ref 42–165)
Saturation Ratios: 35.2 % (ref 20.0–50.0)
TIBC: 358.4 ug/dL (ref 250.0–450.0)
Transferrin: 256 mg/dL (ref 212.0–360.0)

## 2023-12-19 LAB — VITAMIN B12: Vitamin B-12: 800 pg/mL (ref 211–911)

## 2023-12-19 NOTE — Patient Instructions (Addendum)
 Your provider has requested that you go to the basement level for lab work before leaving today. Press B on the elevator. The lab is located at the first door on the left as you exit the elevator.   Miralax  is an osmotic laxative.  It only brings more water  into the stool.  This is safe to take daily.  Can take up to 17 gram of miralax  twice a day.  Mix with juice or coffee.  Start 1/2-1 capful at night for 3-4 days and reassess your response in 3-4 days.  You can increase and decrease the dose based on your response.  Remember, it can take up to 3-4 days to take effect OR for the effects to wear off.   Consider repeat CT AB and pelvis with symptoms  Small intestinal bacterial overgrowth (SIBO) occurs when there is an abnormal increase in the overall bacterial population in the small intestine -- particularly types of bacteria not commonly found in that part of the digestive tract. Small intestinal bacterial overgrowth (SIBO) commonly results when a circumstance -- such as surgery or disease -- slows the passage of food and waste products in the digestive tract, creating a breeding ground for bacteria.  Signs and symptoms of SIBO often include: Loss of appetite Abdominal pain Nausea Bloating An uncomfortable feeling of fullness after eating Diarrhea or constipation, depending on the type of gas produced  What foods trigger SIBO? While foods aren't the original cause of SIBO, certain foods do encourage the overgrowth of the wrong bacteria in your small intestine. If you're feeding them their favorite foods, they're going to grow more, and that will trigger more of your SIBO symptoms. By the same token, you can help reduce the overgrowth by starving the problematic bacteria of their favorite foods. This strategy has led to a number of proposed SIBO eating plans. The plans vary, and so do individual results. But in general, they tend to recommend limiting carbohydrates.  These  include: Sugars and sweeteners. Fruits and starchy vegetables. Dairy products. Grains.  There is a test for this we can do called a breath test, if you are positive we will treat you with an antibiotic to see if it helps.  Your symptoms are very suspicious for this condition, as discussed, we will start you on an antibiotic to see if this helps.   B12 is mainly in meat, so increase meat can help this but often people have a deficiency that they are just not absorbing it well with meat or pills, so get the sublingual/melt in your mouth one.   If the sublingual one dose not increase your level, we will discuss shots.   Vitamin B12 Deficiency Vitamin B12 deficiency occurs when the body does not have enough vitamin B12, which is an important vitamin. The body needs this vitamin: To make red blood cells. To make DNA. This is the genetic material inside cells. To help the nerves work properly so they can carry messages from the brain to the body. Vitamin B12 deficiency can cause various health problems, such as a low red blood cell count (anemia) or nerve damage. What are the causes? This condition may be caused by: Not eating enough foods that contain vitamin B12. Not having enough stomach acid and digestive fluids to properly absorb vitamin B12 from the food that you eat. Certain digestive system diseases that make it hard to absorb vitamin B12. These diseases include Crohn's disease, chronic pancreatitis, and cystic fibrosis. A condition in which  the body does not make enough of a protein (intrinsic factor), resulting in too few red blood cells (pernicious anemia). Having a surgery in which part of the stomach or small intestine is removed. Taking certain medicines that make it hard for the body to absorb vitamin B12. These medicines include: Heartburn medicines (antacids and proton pump inhibitors). Certain antibiotic medicines. Some medicines that are used to treat diabetes, tuberculosis,  gout, or high cholesterol. What increases the risk? The following factors may make you more likely to develop a B12 deficiency: Being older than age 9. Eating a vegetarian or vegan diet, especially while you are pregnant. Eating a poor diet while you are pregnant. Taking certain medicines. Having alcoholism. What are the signs or symptoms? In some cases, there are no symptoms of this condition. If the condition leads to anemia or nerve damage, various symptoms can occur, such as: Weakness. Fatigue. Loss of appetite. Weight loss. Numbness or tingling in your hands and feet. Redness and burning of the tongue. Confusion or memory problems. Depression. Sensory problems, such as color blindness, ringing in the ears, or loss of taste. Diarrhea or constipation. Trouble walking. If anemia is severe, symptoms can include: Shortness of breath. Dizziness. Rapid heart rate (tachycardia). How is this diagnosed? This condition may be diagnosed with a blood test to measure the level of vitamin B12 in your blood. You may also have other tests, including: A group of tests that measure certain characteristics of blood cells (complete blood count, CBC). A blood test to measure intrinsic factor. A procedure where a thin tube with a camera on the end is used to look into your stomach or intestines (endoscopy). Other tests may be needed to discover the cause of B12 deficiency. How is this treated? Treatment for this condition depends on the cause. This condition may be treated by: Changing your eating and drinking habits, such as: Eating more foods that contain vitamin B12. Drinking less alcohol or no alcohol. Getting vitamin B12 injections. Taking vitamin B12 supplements. Your health care provider will tell you which dosage is best for you. Follow these instructions at home: Eating and drinking  Eat lots of healthy foods that contain vitamin B12, including: Meats and poultry. This includes  beef, pork, chicken, [place], and organ meats, such as liver. Seafood. This includes clams, rainbow trout, salmon, tuna, and haddock. Eggs. Cereal and dairy products that are fortified. This means that vitamin B12 has been added to the food. Check the label on the package to see if the food is fortified. The items listed above may not be a complete list of recommended foods and beverages. Contact a dietitian for more information. General instructions Get any injections that are prescribed by your health care provider. Take supplements only as told by your health care provider. Follow the directions carefully. Do not drink alcohol if your health care provider tells you not to. In some cases, you may only be asked to limit alcohol use. Keep all follow-up visits as told by your health care provider. This is important. Contact a health care provider if: Your symptoms come back. Get help right away if you: Develop shortness of breath. Have a rapid heart rate. Have chest pain. Become dizzy or lose consciousness. Summary Vitamin B12 deficiency occurs when the body does not have enough vitamin B12. The main causes of vitamin B12 deficiency include dietary deficiency, digestive diseases, pernicious anemia, and having a surgery in which part of the stomach or small intestine is removed. In some  cases, there are no symptoms of this condition. If the condition leads to anemia or nerve damage, various symptoms can occur, such as weakness, shortness of breath, and numbness. Treatment may include getting vitamin B12 injections or taking vitamin B12 supplements. Eat lots of healthy foods that contain vitamin B12. This information is not intended to replace advice given to you by your health care provider. Make sure you discuss any questions you have with your health care provider. Document Revised: 12/05/2018 Document Reviewed: 02/25/2018 Elsevier Patient Education  [employer].   Due to recent  changes in healthcare laws, you may see the results of your imaging and laboratory studies on MyChart before your provider has had a chance to review them.  We understand that in some cases there may be results that are confusing or concerning to you. Not all laboratory results come back in the same time frame and the provider may be waiting for multiple results in order to interpret others.  Please give us  48 hours in order for your provider to thoroughly review all the results before contacting the office for clarification of your results.    I appreciate the  opportunity to care for you  Thank You   [facility]

## 2023-12-20 ENCOUNTER — Ambulatory Visit: Payer: Self-pay | Admitting: Physician Assistant

## 2023-12-21 LAB — IGA: Immunoglobulin A: 104 mg/dL (ref 70–320)

## 2023-12-21 LAB — TISSUE TRANSGLUTAMINASE, IGA: (tTG) Ab, IgA: <1 U/mL

## 2023-12-26 ENCOUNTER — Ambulatory Visit: Payer: Medicare Other | Admitting: Internal Medicine

## 2024-01-08 ENCOUNTER — Ambulatory Visit: Payer: Medicare Other

## 2024-01-13 ENCOUNTER — Telehealth (HOSPITAL_COMMUNITY): Payer: Self-pay

## 2024-01-13 NOTE — Telephone Encounter (Signed)
 He can try Cymbalta  30 mg twice a day.  We have change to many times his medication I will recommend if higher dose works then he needs to keep to 30 mg twice a day.

## 2024-01-13 NOTE — Telephone Encounter (Signed)
 Patient called to say he has no energy to do anything. He thinks he should go back up on they Cymbalta . Please review and advise, thank you

## 2024-01-14 NOTE — Telephone Encounter (Signed)
 Called patient and advised that Dr. Curry wants him to increase the Cymbalta  to twice a day. Patient was agreeable to this and will call me if a refill is needed before his appointment

## 2024-01-15 NOTE — Progress Notes (Signed)
 HPI M never smoker followed for OSA, Insomnia, Somnolence,  complicated by HTN, RBBB, Allergic Rhinitis, GERD, hx Prostate Cancer, CKD3. Kidney Stones, Depression,Tremor,  Hyperlipidemia,  HST 09/10/22- AHI 9.1/ hr, desat to 88%, body weight 202 lbs  ===================================================    01/15/23- 72 yoM never smoker followed for OSA,Insomnia,  complicated by HTN, RBBB, Allergic Rhinitis, GERD, hx Prostate Cancer, CKD3. Kidney Stones, Depression,Tremor,  Hyperlipidemia,  -Concerta  18 mgCR, Seroquel , Trazodone  50,  HST 09/10/22- AHI 9.1/ hr, desat to 88%, body weight 202 lbs Body weight today-206 lbs CPAP auto 5-15/ [facility]    ordered 09/26/22 Download compliance-100%, AHI 3/ hr Download reviewed. He says he is doing fine, sleeping well. Benefits from CPAP. On and off Concerta  per Dr Joshua. 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   01/16/24- 73 yoM never smoker followed for OSA,Insomnia,  complicated by Chronic Fatigue Syndrome, HTN, RBBB, Allergic Rhinitis, GERD, hx Prostate Cancer, CKD3. Kidney Stones, Depression,Tremor,  Hyperlipidemia,  - Seroquel , Trazodone  50,  Body weight today-196 lbs CPAP auto 5-15/ [facility]    ordered 09/26/22 Download compliance-47%, AHI 3.6/hr Mask not fitting. Now dx'd Chronic Fatigue Syndrome Discussed the use of AI scribe software for clinical note transcription with the patient, who gave verbal consent to proceed.  History of Present Illness   Javontay Vandam is a 73 year old male who presents with issues related to CPAP usage.  He experiences difficulties with the fit of the nasal cushions on his CPAP mask. He has tried different sizes, including small, medium, and large, but none fit properly anymore. Previously, he used the same style of mask without issues. When the CPAP functions correctly, he sleeps more soundly. We will get DME to refit his mask.     Assessment and Plan:     Obstructive sleep apnea Improper CPAP mask fit reduces usage, contributing to fatigue. Attempts with different nasal cushion mask sizes have failed. - Message [facility] for face-to-face CPAP mask fitting. - Encouraged consistent CPAP use post-fitting to enhance sleep quality.  Chronic fatigue syndrome Considered diagnosis of exclusion. Another provider suggested of Epstein-Barr virus infection. Not on Concerta , taking CoQ10 and magnesium  per primary care advice. To me he has rather flat affect and some reduced movement- possibly early Parkinson's?? - Continue CoQ10 and magnesium  as per primary care. - Discuss potential neurologist referral with primary care.    Could also reflect depression    ROS-see HPI   + = positive Constitutional:    +weight loss, night sweats, fevers, chills, fatigue, lassitude. HEENT:    headaches, difficulty swallowing, tooth/dental problems, sore throat,       sneezing, itching, ear ache, nasal congestion, post nasal drip, snoring CV:    chest pain, orthopnea, PND, swelling in lower extremities, anasarca,                                   dizziness, palpitations Resp:   shortness of breath with exertion or at rest.                productive cough,   non-productive cough, coughing up of blood.              change in color of mucus.  wheezing.   Skin:    rash or lesions. GI:  No-   heartburn, indigestion, abdominal pain, nausea, vomiting, diarrhea,  change in bowel habits, loss of appetite GU: dysuria, change in color of urine, no urgency or frequency.   flank pain. MS:   joint pain, stiffness, decreased range of motion, back pain. Neuro-     nothing unusual Psych:  change in mood or affect. + depression or +anxiety.   memory loss.  OBJ- Physical Exam General- Alert, Oriented, Affect-appropriate, Distress- none acute, not obese Skin- rash-none, lesions- none, excoriation- none Lymphadenopathy- none Head- atraumatic            Eyes- Gross  vision intact, PERRLA, conjunctivae and secretions clear            Ears- Hearing, canals-normal            Nose- Clear, no-Septal dev, mucus, polyps, erosion, perforation             Throat- Mallampati III , mucosa clear , drainage- none, tonsils- atrophic, + teeth Neck- flexible , trachea midline, no stridor , thyroid  nl, carotid no bruit Chest - symmetrical excursion , unlabored           Heart/CV- RRR , no murmur , no gallop  , no rub, nl s1 s2                           - JVD+full , edema- none, stasis changes- none, varices- none           Lung- clear to P&A, wheeze- none, cough- none , dullness-none, rub- none           Chest wall-  Abd-  Br/ Gen/ Rectal- Not done, not indicated Extrem- cyanosis- none, clubbing, none, atrophy- none, strength- nl Neuro- grossly intact to observation

## 2024-01-16 ENCOUNTER — Ambulatory Visit: Payer: Medicare Other | Admitting: Internal Medicine

## 2024-01-16 ENCOUNTER — Encounter: Payer: Self-pay | Admitting: Internal Medicine

## 2024-01-16 VITALS — BP 122/74 | HR 85 | Temp 98.3°F | Ht 72.0 in | Wt 196.6 lb

## 2024-01-16 DIAGNOSIS — G4733 Obstructive sleep apnea (adult) (pediatric): Secondary | ICD-10-CM

## 2024-01-16 NOTE — Patient Instructions (Addendum)
 Order- DME [facility]- please bring Marcus Crawford in for a face-to-face CPAP mask of choice refit for better compliance. We can continue auto 5-15.

## 2024-01-17 ENCOUNTER — Other Ambulatory Visit: Payer: Self-pay | Admitting: Internal Medicine

## 2024-01-17 DIAGNOSIS — J301 Allergic rhinitis due to pollen: Secondary | ICD-10-CM

## 2024-01-27 DIAGNOSIS — H04123 Dry eye syndrome of bilateral lacrimal glands: Secondary | ICD-10-CM | POA: Diagnosis not present

## 2024-01-27 DIAGNOSIS — H2513 Age-related nuclear cataract, bilateral: Secondary | ICD-10-CM | POA: Diagnosis not present

## 2024-01-30 ENCOUNTER — Ambulatory Visit: Admitting: Internal Medicine

## 2024-02-02 ENCOUNTER — Other Ambulatory Visit (HOSPITAL_COMMUNITY): Payer: Self-pay | Admitting: Psychiatry

## 2024-02-02 DIAGNOSIS — F5104 Psychophysiologic insomnia: Secondary | ICD-10-CM

## 2024-02-02 DIAGNOSIS — G9332 Myalgic encephalomyelitis/chronic fatigue syndrome: Secondary | ICD-10-CM

## 2024-02-06 ENCOUNTER — Ambulatory Visit (INDEPENDENT_AMBULATORY_CARE_PROVIDER_SITE_OTHER): Admitting: Family Medicine

## 2024-02-06 ENCOUNTER — Other Ambulatory Visit (HOSPITAL_COMMUNITY): Payer: Self-pay

## 2024-02-06 ENCOUNTER — Encounter: Payer: Self-pay | Admitting: Family Medicine

## 2024-02-06 VITALS — BP 126/76 | HR 72 | Temp 97.2°F | Resp 18 | Wt 194.4 lb

## 2024-02-06 DIAGNOSIS — G9332 Myalgic encephalomyelitis/chronic fatigue syndrome: Secondary | ICD-10-CM

## 2024-02-06 DIAGNOSIS — F331 Major depressive disorder, recurrent, moderate: Secondary | ICD-10-CM | POA: Diagnosis not present

## 2024-02-06 DIAGNOSIS — G4733 Obstructive sleep apnea (adult) (pediatric): Secondary | ICD-10-CM

## 2024-02-06 DIAGNOSIS — F5104 Psychophysiologic insomnia: Secondary | ICD-10-CM | POA: Diagnosis not present

## 2024-02-06 MED ORDER — DULOXETINE HCL 60 MG PO CPEP: 60.0000 mg | ORAL_CAPSULE | Freq: Every day | ORAL | 0 refills | Status: DC

## 2024-02-06 MED ORDER — TRAZODONE HCL 50 MG PO TABS: 50.0000 mg | ORAL_TABLET | Freq: Every day | ORAL | 3 refills | Status: AC

## 2024-02-06 MED ORDER — QUETIAPINE FUMARATE 25 MG PO TABS: 25.0000 mg | ORAL_TABLET | Freq: Every day | ORAL | 3 refills | Status: AC

## 2024-02-06 NOTE — Progress Notes (Signed)
 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   Relevant Orders   Ambulatory referral to [facility]   Other Visit Diagnoses       Psychophysiological insomnia       Relevant Medications   QUEtiapine  (SEROQUEL ) 25 MG tablet   traZODone  (DESYREL ) 50 MG tablet     Chronic fatigue disorder       Relevant Medications   QUEtiapine  (SEROQUEL ) 25 MG tablet     MDD (major depressive disorder), recurrent episode, moderate (HCC)       Relevant Medications   QUEtiapine  (SEROQUEL ) 25 MG tablet   traZODone  (DESYREL ) 50 MG tablet           Assessment and Plan Assessment & Plan Myalgic encephalomyelitis/chronic fatigue syndrome Chronic fatigue persists despite extensive evaluation and treatment trials. Multiple negative lab results including CBC, CMP, lipid function testing, iron levels, B12, thyroid  dysfunction, hemoglobin A1c, and cortisol levels. EBV IgG positive but inconclusive for acute infection. No abnormalities identified in thoracic spine imaging. Fatigue potentially exacerbated by sleep apnea. Consideration for referral to tertiary care center for multidisciplinary support. - Refer to [facility] for consultation and assessment.  Obstructive sleep apnea Recent CPAP use with difficulty fitting masks. Working with pulmonology to improve mask fit and assist with fatigue management.  Depression Managed with duloxetine , currently at 60 mg. Previous attempt to taper off duloxetine  may have caused relapse. Continues to follow with psychiatry.  Insomnia Managed with quetiapine  and trazodone . Continues to follow with psychiatry. - Refill quetiapine  and trazodone .      Medications Discontinued During This Encounter  Medication Reason   QUEtiapine  (SEROQUEL ) 25 MG tablet  Reorder   traZODone  (DESYREL ) 50 MG tablet Reorder    Return if symptoms worsen or fail to improve.        Subjective:   Encounter date: 02/06/2024  Marcus Crawford is a 73 y.o. 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   He  has a past medical history of Anemia, Bell's palsy (1977), Blood clot in vein (2005), Blood transfusion without reported diagnosis, Borderline diabetic, Cancer (HCC) (08/2013), Cataract, CKD (chronic kidney disease) stage 3, GFR 30-59 ml/min (HCC) (saw dr webb 6 months ago), Colonic polyp, Diverticulosis of colon, GERD (gastroesophageal reflux disease), H/O hiatal hernia, Hemorrhoid, Hyperlipidemia, Hypertension, Kidney stone on left side (2013), Migraine (last 6-7 yrs ago), Nephrolithiasis (2013), and PONV (postoperative nausea and vomiting).SABRA   He presents with chief complaint of Fatigue (2 month follow up. Pt is still experiencing symptoms no improvement .//HM due- shingles vaccine (pt would like to postpone) ) and Medication Refill (RX refill request for trazodone  50MG  and Seroquel  25MG  ) .   Discussed the use of AI scribe software for clinical note transcription with the patient, who gave  verbal consent to proceed.  History of Present Illness Marcus Crawford is a 73 year old male with myalgic encephalomyelitis  who presents with chronic fatigue.  He has experienced persistent chronic fatigue for two years, describing it as feeling 'tired' without improvement despite various interventions. He has been evaluated by multiple specialties including gastroenterology, pulmonology, cardiology, and psychiatry, but no definitive cause for his fatigue has been identified.  He has a history of sleep apnea and reports difficulty with CPAP mask fitting, which he is currently addressing. He experiences daytime somnolence and insomnia. Despite using CPAP, his fatigue persists.  He has been trialed on various supplements such as CoQ10 and magnesium , as well as stimulants, but these have not alleviated his symptoms. He also takes duloxetine  60 mg for depression, quetiapine , and trazodone  to assist with sleep.  His extensive workup includes negative results from CBC, CMP, lipid function testing, iron levels, B12, thyroid  function, hemoglobin A1c, and cortisol levels. He had a positive EBV IgG, but it was inconclusive in determining an acute infection.  He has a history of prostate cancer, for which he follows up with urology. He also has central hypertension, reflux disease, tremors, chronic constipation, and B12 deficiency due to malabsorption.  His fatigue worsened around April when he attempted to taper off duloxetine , which he believes may have caused a relapse. He notes feeling slightly better at night compared to during the day.  He tries to maintain physical activity by walking and exercising, although he finds it challenging due to his fatigue.     ROS  Past Surgical History:  Procedure Laterality Date   adrenal venous sampling  03-2012   ADRENALECTOMY  04/14/2012   Procedure: ADRENALECTOMY;  Surgeon: Lynda Leos, MD;  Location: [facility];  Service: General;  Laterality: Left;  Left Adrenal Excision   APPENDECTOMY  1985   COLONOSCOPY     last 9/12, Dr Aneita; due 2017   ESOPHAGEAL DILATION  2001   Dr  Cloretta   POLYPECTOMY      X 3   ROBOT ASSISTED LAPAROSCOPIC RADICAL PROSTATECTOMY N/A 09/07/2013   Procedure: ROBOTIC ASSISTED LAPAROSCOPIC RADICAL PROSTATECTOMY LEVEL 1;  Surgeon: Noretta Ferrara, MD;  Location: [facility];  Service: Urology;  Laterality: N/A;    Outpatient Medications Prior to Visit  Medication Sig Dispense Refill   cholecalciferol (VITAMIN D ) 1000 UNITS tablet Take 2,000 Units by mouth daily. Taking 2000 units daily     co-enzyme Q-10 30 MG capsule Take 100 mg by mouth 2 (two) times daily.     cyanocobalamin  (VITAMIN B12) 1000 MCG tablet Take 1,000 mcg by mouth daily.     cycloSPORINE (RESTASIS) 0.05 % ophthalmic emulsion 1 drop 2 (two) times daily.     docusate sodium  (COLACE) 100 MG capsule Take 1 capsule (100 mg total) by mouth every 12 (twelve) hours. 60 capsule 0   DULoxetine  (CYMBALTA ) 60 MG capsule Take 1 capsule (60 mg total) by mouth daily. 30 capsule 0   levocetirizine (XYZAL ) 5 MG tablet TAKE ONE TABLET BY MOUTH EVERY EVENING 90 tablet 1   magnesium  citrate solution Take 296 mLs by mouth once.     olmesartan  (BENICAR ) 20 MG tablet TAKE 1 TABLET(20 MG) BY MOUTH DAILY 90 tablet 0   polyethylene glycol powder (MIRALAX ) 17 GM/SCOOP powder Take 17 g by mouth daily as needed for severe constipation or moderate constipation. 255 g 0   Polyvinyl Alcohol-Povidone (REFRESH OP) Place 1 drop into both eyes 4 (four) times daily as  needed (dryness).     pravastatin  (PRAVACHOL ) 10 MG tablet TAKE 1 TABLET(10 MG) BY MOUTH DAILY 90 tablet 0   RABEprazole  (ACIPHEX ) 20 MG tablet TAKE ONE TABLET BY MOUTH ONE TIME DAILY 90 tablet 1   QUEtiapine  (SEROQUEL ) 25 MG tablet Take 1 tablet (25 mg total) by mouth at bedtime. 90 tablet 0   traZODone  (DESYREL ) 50 MG tablet Take 1 tablet (50 mg total) by mouth at bedtime. 90 tablet 0   No facility-administered medications prior to visit.    Family History  Problem Relation Age of Onset   Rectal cancer Mother    Colon cancer Mother 23   Heart  attack Mother 66   Colon polyps Sister    Lung cancer Sister        smoker   Diabetes Sister    Breast cancer Sister    Alzheimer's disease Sister    Heart failure Brother    Kidney cancer Brother    Prostate cancer Brother 12   Heart disease Brother    Hypertension Brother    Kidney cancer Brother    Diabetes Brother    Heart attack Brother 55   Esophageal cancer Maternal Uncle    Kidney cancer Maternal Uncle    Stomach cancer Neg Hx     Social History   Socioeconomic History   Marital status: Married    Spouse name: Adrien   Number of children: 0   Years of education: Not on file   Highest education level: Some college, no degree  Occupational History   Occupation: retired  Tobacco Use   Smoking status: Never   Smokeless tobacco: Never  Vaping Use   Vaping status: Never Used  Substance and Sexual Activity   Alcohol use: Not Currently   Drug use: No   Sexual activity: Yes  Other Topics Concern   Not on file  Social History Narrative   Right handed        Are you currently employed ?    What is your current occupation?retired   Do you live at home alone?   Who lives with you?    What type of home do you live in: 1 story or 2 story?    Caffiene  1 daily   Social Drivers of Corporate investment banker Strain: Low Risk  (02/03/2024)   Overall Financial Resource Strain (CARDIA)    Difficulty of Paying Living Expenses: Not hard at all  Food Insecurity: No Food Insecurity (02/03/2024)   Hunger Vital Sign    Worried About Running Out of Food in the Last Year: Never true    Ran Out of Food in the Last Year: Never true  Transportation Needs: Unknown (02/03/2024)   PRAPARE - Administrator, Civil Service (Medical): Patient declined    Lack of Transportation (Non-Medical): No  Physical Activity: Inactive (02/03/2024)   Exercise Vital Sign    Days of Exercise per Week: 0 days    Minutes of Exercise per Session: Not on file  Stress: Stress Concern Present  (05/11/2023)   [employer] of Occupational Health - Occupational Stress Questionnaire    Feeling of Stress : To some extent  Social Connections: Moderately Integrated (02/03/2024)   Social Connection and Isolation Panel    Frequency of Communication with Friends and Family: Three times a week    Frequency of Social Gatherings with Friends and Family: Once a week    Attends Religious Services: Never    Active Member  of Clubs or Organizations: Yes    Attends Banker Meetings: Never    Marital Status: Married  Catering manager Violence: Not At Risk (01/07/2023)   Humiliation, Afraid, Rape, and Kick questionnaire    Fear of Current or Ex-Partner: No    Emotionally Abused: No    Physically Abused: No    Sexually Abused: No                                                                                                  Objective:  Physical Exam: BP 126/76 (BP Location: Left Arm, Patient Position: Sitting, Cuff Size: Large)   Pulse 72   Temp (!) 97.2 F (36.2 C) (Temporal)   Resp 18   Wt 194 lb 6.4 oz (88.2 kg)   SpO2 98%   BMI 26.37 kg/m    Physical Exam GENERAL: Alert, cooperative, well developed, no acute distress HEENT: Normocephalic, normal oropharynx, moist mucous membranes CHEST: Clear to auscultation bilaterally, No wheezes, rhonchi, or crackles CARDIOVASCULAR: Normal heart rate and rhythm, S1 and S2 normal without murmurs ABDOMEN: Soft, non-tender, non-distended, without organomegaly, Normal bowel sounds EXTREMITIES: No cyanosis or edema NEUROLOGICAL: Cranial nerves grossly intact, Moves all extremities without gross motor or sensory deficit   Physical Exam  DG Abd 1 View Result Date: 11/08/2023 CLINICAL DATA:  Abdominal pain, no bowel movement EXAM: ABDOMEN - 1 VIEW COMPARISON:  July 15, 2013, April 30, 2022 FINDINGS: Decompressed stomach. No significantly dilated small bowel. Gaseous distention of the transverse colon. No pneumoperitoneum. No  organomegaly or radiopaque calculi. No acute fracture or destructive lesion. The lung bases are clear. Multilevel degenerative disc disease of the spine. IMPRESSION: Nonobstructive bowel gas pattern. Electronically Signed   By: Rogelia Myers M.D.   On: 11/08/2023 17:21    Recent Results (from the past 2160 hours)  Vitamin B12     Status: None   Collection Time: 12/19/23 10:10 AM  Result Value Ref Range   Vitamin B-12 800 211 - 911 pg/mL  IBC + Ferritin     Status: None   Collection Time: 12/19/23 10:10 AM  Result Value Ref Range   Iron 126 42 - 165 ug/dL   Transferrin 743.9 787.9 - 360.0 mg/dL   Saturation Ratios 64.7 20.0 - 50.0 %   Ferritin 86.6 22.0 - 322.0 ng/mL   TIBC 358.4 250.0 - 450.0 mcg/dL  IgA     Status: None   Collection Time: 12/19/23 10:10 AM  Result Value Ref Range   Immunoglobulin A 104 70 - 320 mg/dL  Tissue transglutaminase, IgA     Status: None   Collection Time: 12/19/23 10:10 AM  Result Value Ref Range   (tTG) Ab, IgA <1.0 U/mL    Comment: Value          Interpretation -----          -------------- <15.0          Antibody not detected > or = 15.0    Antibody detected .   Comprehensive metabolic panel with GFR     Status: Abnormal   Collection Time:  12/19/23 10:10 AM  Result Value Ref Range   Sodium 141 135 - 145 mEq/L   Potassium 4.4 3.5 - 5.1 mEq/L   Chloride 105 96 - 112 mEq/L   CO2 30 19 - 32 mEq/L   Glucose, Bld 107 (H) 70 - 99 mg/dL   BUN 24 (H) 6 - 23 mg/dL   Creatinine, Ser 8.43 (H) 0.40 - 1.50 mg/dL   Total Bilirubin 0.5 0.2 - 1.2 mg/dL   Alkaline Phosphatase 61 39 - 117 U/L   AST 10 0 - 37 U/L   ALT 19 0 - 53 U/L   Total Protein 6.5 6.0 - 8.3 g/dL   Albumin 4.4 3.5 - 5.2 g/dL   GFR 56.76 (L) >39.99 mL/min    Comment: Calculated using the CKD-EPI Creatinine Equation (2021)   Calcium  9.5 8.4 - 10.5 mg/dL  CBC with Differential/Platelet     Status: None   Collection Time: 12/19/23 10:10 AM  Result Value Ref Range   WBC 5.5 4.0 - 10.5  K/uL   RBC 4.73 4.22 - 5.81 Mil/uL   Hemoglobin 14.1 13.0 - 17.0 g/dL   HCT 57.4 60.9 - 47.9 %   MCV 89.8 78.0 - 100.0 fl   MCHC 33.2 30.0 - 36.0 g/dL   RDW 86.7 88.4 - 84.4 %   Platelets 169.0 150.0 - 400.0 K/uL   Neutrophils Relative % 70.2 43.0 - 77.0 %   Lymphocytes Relative 19.7 12.0 - 46.0 %   Monocytes Relative 7.7 3.0 - 12.0 %   Eosinophils Relative 1.5 0.0 - 5.0 %   Basophils Relative 0.9 0.0 - 3.0 %   Neutro Abs 3.9 1.4 - 7.7 K/uL   Lymphs Abs 1.1 0.7 - 4.0 K/uL   Monocytes Absolute 0.4 0.1 - 1.0 K/uL   Eosinophils Absolute 0.1 0.0 - 0.7 K/uL   Basophils Absolute 0.0 0.0 - 0.1 K/uL        Beverley Adine Hummer, MD, MS

## 2024-02-06 NOTE — Patient Instructions (Signed)
  VISIT SUMMARY: Today, you were seen for your ongoing chronic fatigue, which has been persistent for two years despite various treatments and evaluations. We discussed your history of sleep apnea, depression, and insomnia, and reviewed your current medications and their effectiveness. We also went over your recent lab results and imaging studies, which have not identified a clear cause for your fatigue.  YOUR PLAN: -MYALGIC ENCEPHALOMYELITIS/CHRONIC FATIGUE SYNDROME: This condition involves long-term fatigue that does not improve with rest and is not caused by other medical conditions. We will refer you to [facility] for a comprehensive consultation and assessment to explore further treatment options.  -OBSTRUCTIVE SLEEP APNEA: This is a condition where your breathing stops and starts during sleep. You are currently using a CPAP machine but have had difficulty with mask fitting. You are working with pulmonology to improve the fit and manage your fatigue.  -DEPRESSION: This is a mood disorder that causes persistent feelings of sadness and loss of interest. You are taking duloxetine  at 60 mg, and we discussed that a previous attempt to reduce this medication may have worsened your fatigue. You will continue to follow up with psychiatry for management.  -INSOMNIA: This is a sleep disorder where you have trouble falling or staying asleep. You are currently taking quetiapine  and trazodone  to help with sleep, and you will continue to follow up with psychiatry. We have refilled your prescriptions for these medications.  INSTRUCTIONS: You will be referred to [facility] for a comprehensive consultation and assessment. Please continue working with pulmonology to improve your CPAP mask fit. Follow up with psychiatry for ongoing management of your depression and insomnia. Refill your prescriptions for quetiapine  and trazodone  as needed.

## 2024-02-07 ENCOUNTER — Other Ambulatory Visit: Payer: Self-pay | Admitting: Psychiatry

## 2024-02-13 ENCOUNTER — Ambulatory Visit (HOSPITAL_BASED_OUTPATIENT_CLINIC_OR_DEPARTMENT_OTHER): Admitting: Psychiatry

## 2024-02-13 ENCOUNTER — Encounter (HOSPITAL_COMMUNITY): Payer: Self-pay | Admitting: Psychiatry

## 2024-02-13 ENCOUNTER — Ambulatory Visit (HOSPITAL_COMMUNITY): Admitting: Psychiatry

## 2024-02-13 VITALS — Wt 194.0 lb

## 2024-02-13 DIAGNOSIS — G4733 Obstructive sleep apnea (adult) (pediatric): Secondary | ICD-10-CM

## 2024-02-13 DIAGNOSIS — F419 Anxiety disorder, unspecified: Secondary | ICD-10-CM | POA: Diagnosis not present

## 2024-02-13 DIAGNOSIS — G9332 Myalgic encephalomyelitis/chronic fatigue syndrome: Secondary | ICD-10-CM | POA: Diagnosis not present

## 2024-02-13 MED ORDER — DULOXETINE HCL 30 MG PO CPEP
30.0000 mg | ORAL_CAPSULE | Freq: Every day | ORAL | 0 refills | Status: DC
Start: 2024-02-13 — End: 2024-03-12

## 2024-02-13 MED ORDER — GABAPENTIN 100 MG PO CAPS: 100.0000 mg | ORAL_CAPSULE | Freq: Two times a day (BID) | ORAL | 0 refills | Status: DC

## 2024-02-13 NOTE — Progress Notes (Signed)
 [facility] MD/PA/NP OP Progress Note  Patient location; office Provider location; office  02/13/2024 1:31 PM Marcus Crawford  MRN:  [id number]  Chief Complaint:  Chief Complaint  Patient presents with   Anxiety   Follow-up   HPI: Patient came today to the office for his appointment.  He tried to cut down his Cymbalta  again from 60 mg to 30 mg but he started to have a lot of tremors shakes.  We recommended to go back to 60 mg but he has not seen significant improvement.  He still feels very tired, nervous, anxious and still struggle with tremors.  He does not like to take the Cymbalta  because it is not helping.  Recently seen his primary care and his Seroquel  and trazodone  was prescribed.  He denies any depressive thoughts, suicidal thoughts.  He sleeps good with the help of sleep apnea.  He has appointment coming up to see the doctor at [facility] for chronic fatigue syndrome.  He admitted not have enough motivation to do things and gets tired.  He admitted ruminative thoughts because of his chronic fatigue but no hallucination or paranoia.    ICD-10-CM   1. Anxiety  F41.9 DULoxetine  (CYMBALTA ) 30 MG capsule    gabapentin  (NEURONTIN ) 100 MG capsule    2. Chronic fatigue disorder  G93.32 DULoxetine  (CYMBALTA ) 30 MG capsule    3. OSA (obstructive sleep apnea)  G47.33 DULoxetine  (CYMBALTA ) 30 MG capsule      Past Psychiatric History: No history of suicidal attempt, mania, psychosis, delusion, PTSD or inpatient services.  Started taking antidepressant last year from primary care.  He was given Seroquel , Cymbalta  and Concerta .  Unexplained fatigue after the episode while doing the lawn more.  Had a few ER visit for extreme fatigue.  Seroquel  was discontinued by this Clinical research associate.  Past Medical History:  Past Medical History:  Diagnosis Date   Anemia    early 20's   Bell's palsy 1977   Blood clot in vein 2005   left leg blood clot-treated as OP; no residual   Blood transfusion without reported diagnosis     with appendectomy   Borderline diabetic    no meds   Cancer (HCC) 08/2013   prostate/ had surgery/ no chemo or radiation   Cataract    forming   CKD (chronic kidney disease) stage 3, GFR 30-59 ml/min (HCC) saw dr douglass 6 months ago   on rampiril for kidney function also   Colonic polyp    X3 ,hyperplastic   Diverticulosis of colon    GERD (gastroesophageal reflux disease)    H/O hiatal hernia    Hemorrhoid    Hyperlipidemia    Hypertension    past hx - no meds since 2013 with  adrenal gland surgery   Kidney stone on left side 2013   asymptomatic , incidental finding   Migraine last 6-7 yrs ago   PMH of ; resolved post adrenal adenoma resection   Nephrolithiasis 2013   kidney stone  as incidental finding on imaging   PONV (postoperative nausea and vomiting)     Past Surgical History:  Procedure Laterality Date   adrenal venous sampling  03-2012   ADRENALECTOMY  04/14/2012   Procedure: ADRENALECTOMY;  Surgeon: Lynda Leos, MD;  Location: [facility];  Service: General;  Laterality: Left;  Left Adrenal Excision   APPENDECTOMY  1985   COLONOSCOPY     last 9/12, Dr Aneita; due 2017   ESOPHAGEAL DILATION  2001   Dr [facility]oretta  POLYPECTOMY      X 3   ROBOT ASSISTED LAPAROSCOPIC RADICAL PROSTATECTOMY N/A 09/07/2013   Procedure: ROBOTIC ASSISTED LAPAROSCOPIC RADICAL PROSTATECTOMY LEVEL 1;  Surgeon: Noretta Ferrara, MD;  Location: [facility];  Service: Urology;  Laterality: N/A;    Family Psychiatric History: Reviewed  Family History:  Family History  Problem Relation Age of Onset   Rectal cancer Mother    Colon cancer Mother 31   Heart attack Mother 37   Colon polyps Sister    Lung cancer Sister        smoker   Diabetes Sister    Breast cancer Sister    Alzheimer's disease Sister    Heart failure Brother    Kidney cancer Brother    Prostate cancer Brother 38   Heart disease Brother    Hypertension Brother    Kidney cancer Brother    Diabetes Brother    Heart attack Brother 93    Esophageal cancer Maternal Uncle    Kidney cancer Maternal Uncle    Stomach cancer Neg Hx     Social History:  Social History   Socioeconomic History   Marital status: Married    Spouse name: Adrien   Number of children: 0   Years of education: Not on file   Highest education level: Some college, no degree  Occupational History   Occupation: retired  Tobacco Use   Smoking status: Never   Smokeless tobacco: Never  Vaping Use   Vaping status: Never Used  Substance and Sexual Activity   Alcohol use: Not Currently   Drug use: No   Sexual activity: Yes  Other Topics Concern   Not on file  Social History Narrative   Right handed        Are you currently employed ?    What is your current occupation?retired   Do you live at home alone?   Who lives with you?    What type of home do you live in: 1 story or 2 story?    Caffiene  1 daily   Social Drivers of Corporate investment banker Strain: Low Risk  (02/03/2024)   Overall Financial Resource Strain (CARDIA)    Difficulty of Paying Living Expenses: Not hard at all  Food Insecurity: No Food Insecurity (02/03/2024)   Hunger Vital Sign    Worried About Running Out of Food in the Last Year: Never true    Ran Out of Food in the Last Year: Never true  Transportation Needs: Unknown (02/03/2024)   PRAPARE - Administrator, Civil Service (Medical): Patient declined    Lack of Transportation (Non-Medical): No  Physical Activity: Inactive (02/03/2024)   Exercise Vital Sign    Days of Exercise per Week: 0 days    Minutes of Exercise per Session: Not on file  Stress: Stress Concern Present (05/11/2023)   [employer] of Occupational Health - Occupational Stress Questionnaire    Feeling of Stress : To some extent  Social Connections: Moderately Integrated (02/03/2024)   Social Connection and Isolation Panel    Frequency of Communication with Friends and Family: Three times a week    Frequency of Social Gatherings with  Friends and Family: Once a week    Attends Religious Services: Never    Database administrator or Organizations: Yes    Attends Banker Meetings: Never    Marital Status: Married    Allergies:  Allergies  Allergen Reactions   Penicillins Rash  Rash (RN clarified with pt) No SOB/swelling   Sulfa Antibiotics Rash    Metabolic Disorder Labs: Lab Results  Component Value Date   HGBA1C 6.0 09/30/2023   MPG 117 01/26/2020   Lab Results  Component Value Date   PROLACTIN 5.6 02/20/2022   Lab Results  Component Value Date   CHOL 152 09/30/2023   TRIG 210.0 (H) 09/30/2023   HDL 42.00 09/30/2023   CHOLHDL 4 09/30/2023   VLDL 42.0 (H) 09/30/2023   LDLCALC 68 09/30/2023   LDLCALC 115 (H) 01/26/2020   Lab Results  Component Value Date   TSH 1.90 09/30/2023   TSH 2.11 11/06/2022    Therapeutic Level Labs: No results found for: LITHIUM No results found for: VALPROATE No results found for: CBMZ  Current Medications: Current Outpatient Medications  Medication Sig Dispense Refill   cholecalciferol (VITAMIN D ) 1000 UNITS tablet Take 2,000 Units by mouth daily. Taking 2000 units daily     co-enzyme Q-10 30 MG capsule Take 100 mg by mouth 2 (two) times daily.     cyanocobalamin  (VITAMIN B12) 1000 MCG tablet Take 1,000 mcg by mouth daily.     cycloSPORINE (RESTASIS) 0.05 % ophthalmic emulsion 1 drop 2 (two) times daily.     docusate sodium  (COLACE) 100 MG capsule Take 1 capsule (100 mg total) by mouth every 12 (twelve) hours. 60 capsule 0   DULoxetine  (CYMBALTA ) 60 MG capsule Take 1 capsule (60 mg total) by mouth daily. 30 capsule 0   levocetirizine (XYZAL ) 5 MG tablet TAKE ONE TABLET BY MOUTH EVERY EVENING 90 tablet 1   magnesium  citrate solution Take 296 mLs by mouth once.     olmesartan  (BENICAR ) 20 MG tablet TAKE 1 TABLET(20 MG) BY MOUTH DAILY 90 tablet 0   polyethylene glycol powder (MIRALAX ) 17 GM/SCOOP powder Take 17 g by mouth daily as needed for  severe constipation or moderate constipation. 255 g 0   Polyvinyl Alcohol-Povidone (REFRESH OP) Place 1 drop into both eyes 4 (four) times daily as needed (dryness).     pravastatin  (PRAVACHOL ) 10 MG tablet TAKE 1 TABLET(10 MG) BY MOUTH DAILY 90 tablet 0   QUEtiapine  (SEROQUEL ) 25 MG tablet Take 1 tablet (25 mg total) by mouth at bedtime. 90 tablet 3   RABEprazole  (ACIPHEX ) 20 MG tablet TAKE ONE TABLET BY MOUTH ONE TIME DAILY 90 tablet 1   traZODone  (DESYREL ) 50 MG tablet Take 1 tablet (50 mg total) by mouth at bedtime. 90 tablet 3   No current facility-administered medications for this visit.   Psychiatric Specialty Exam: Physical Exam  Review of Systems  Constitutional:  Positive for fatigue.  Neurological:  Positive for tremors.  Psychiatric/Behavioral:  The patient is nervous/anxious.     There were no vitals taken for this visit.There is no height or weight on file to calculate BMI.  General Appearance: Casual  Eye Contact:  Fair  Speech:  Slow  Volume:  Decreased  Mood:  Anxious  Affect:  Appropriate  Thought Process:  Goal Directed  Orientation:  Full (Time, Place, and Person)  Thought Content:  Rumination  Suicidal Thoughts:  No  Homicidal Thoughts:  No  Memory:  Immediate;   Good Recent;   Fair Remote;   Fair  Judgement:  Fair  Insight:  Shallow  Psychomotor Activity:  Decreased  Concentration:  Concentration: Fair and Attention Span: Fair  Recall:  [employer] of Knowledge:  Good  Language:  Good  Akathisia:  No  Handed:  Right  AIMS (if indicated):     Assets:  Communication Skills Desire for Improvement Housing Social Support Transportation  ADL's:  Intact  Cognition:  WNL  Sleep:   ok        Screenings: GAD-7    Flowsheet Row Office Visit from 02/06/2024 in [facility] [employer] Visit from 06/27/2023 in [facility] [place] [facility] [place] Office Visit from [facility]  Total GAD-7 Score 2 1 1    PHQ2-9    Flowsheet Row Office Visit from 02/06/2024 in [facility] [place] [facility] [place] Visit from 09/30/2023 in [facility] [place] [facility] [place] Office Visit from 06/27/2023 in [facility] [place] Office Visit from 03/21/2023 [facility] Clinical Support from 01/07/2023 in [facility]  PHQ-2 Total Score 0 0 0 0 0  PHQ-9 Total Score 3 0 3 4 5    Flowsheet Row UC from 11/08/2023 in [facility] Office Visit from 03/21/2023 in [facility] ED from 05/02/2022 in [facility] Emergency Department at [facility]  C-SSRS RISK CATEGORY No Risk No Risk No Risk     Assessment and Plan:  Patient is 73 year old Caucasian man with a history of chronic fatigue, hypertension, sleep apnea, chronic kidney disease, anxiety, chronic insomnia and uses CPAP machine.  Discussed current medication and reviewed blood work results.  BUN 24 and creatinine 1.56.  Encourage hydration.  Patient does not feel the Cymbalta  are working and even try higher dose recently did not help.  His major concern is shakes and tremors.  Discussed possibility of withdrawals but higher dose of Cymbalta  did not help the tremors.  He like to cut down and stop.  Will try the gabapentin  100 mg to help his anxiety, tremors and shakes.  Will reduce Cymbalta  to 30 mg for 1 month and discontinue.  I encouraged to keep appointment with the daughter at [facility] to discuss more about possible treatment of chronic fatigue syndrome.  His trazodone  and Seroquel  was recently filled by his PCP.  Will follow-up in 4 to 6 weeks.  Recommend to call back if is any question or any concern about medication.  Collaboration of Care: Collaboration of Care: Other provider involved in patient's care  AEB I will forward my note to primary care.  Patient/Guardian was advised Release of Information must be obtained prior to any record release in order to collaborate their care with an outside provider. Patient/Guardian was advised if they have not already done so to contact the registration department to sign all necessary forms in order for us  to release information regarding their care.   Consent: Patient/Guardian gives verbal consent for treatment and assignment of benefits for services provided during this visit. Patient/Guardian expressed understanding and agreed to proceed.   I spent 27 minutes face-to-face time with the patient during this encounter.  Leni ONEIDA Client, MD 02/13/2024, 1:31 PM

## 2024-02-17 ENCOUNTER — Ambulatory Visit (INDEPENDENT_AMBULATORY_CARE_PROVIDER_SITE_OTHER): Admitting: Physician Assistant

## 2024-02-17 ENCOUNTER — Encounter: Payer: Self-pay | Admitting: Physician Assistant

## 2024-02-17 VITALS — BP 130/70 | HR 80 | Ht 72.0 in | Wt 193.0 lb

## 2024-02-17 DIAGNOSIS — Z8719 Personal history of other diseases of the digestive system: Secondary | ICD-10-CM

## 2024-02-17 DIAGNOSIS — G9332 Myalgic encephalomyelitis/chronic fatigue syndrome: Secondary | ICD-10-CM | POA: Diagnosis not present

## 2024-02-17 DIAGNOSIS — Z800 Family history of malignant neoplasm of digestive organs: Secondary | ICD-10-CM

## 2024-02-17 DIAGNOSIS — K5909 Other constipation: Secondary | ICD-10-CM | POA: Diagnosis not present

## 2024-02-17 DIAGNOSIS — K219 Gastro-esophageal reflux disease without esophagitis: Secondary | ICD-10-CM | POA: Diagnosis not present

## 2024-02-17 DIAGNOSIS — D8989 Other specified disorders involving the immune mechanism, not elsewhere classified: Secondary | ICD-10-CM

## 2024-02-17 DIAGNOSIS — E538 Deficiency of other specified B group vitamins: Secondary | ICD-10-CM

## 2024-02-17 DIAGNOSIS — K2100 Gastro-esophageal reflux disease with esophagitis, without bleeding: Secondary | ICD-10-CM

## 2024-02-17 DIAGNOSIS — K5904 Chronic idiopathic constipation: Secondary | ICD-10-CM

## 2024-02-17 DIAGNOSIS — R109 Unspecified abdominal pain: Secondary | ICD-10-CM

## 2024-02-17 NOTE — Patient Instructions (Signed)
 Continue miralax   Can consider switching to linzess Consider flagyl trial of 10 day for small intestinal overgrowth   Small intestinal bacterial overgrowth (SIBO) occurs when there is an abnormal increase in the overall bacterial population in the small intestine -- particularly types of bacteria not commonly found in that part of the digestive tract. Small intestinal bacterial overgrowth (SIBO) commonly results when a circumstance -- such as surgery or disease -- slows the passage of food and waste products in the digestive tract, creating a breeding ground for bacteria.  Signs and symptoms of SIBO often include: Loss of appetite Abdominal pain Nausea Bloating An uncomfortable feeling of fullness after eating Diarrhea or constipation, depending on the type of gas produced  What foods trigger SIBO? While foods aren't the original cause of SIBO, certain foods do encourage the overgrowth of the wrong bacteria in your small intestine. If you're feeding them their favorite foods, they're going to grow more, and that will trigger more of your SIBO symptoms. By the same token, you can help reduce the overgrowth by starving the problematic bacteria of their favorite foods. This strategy has led to a number of proposed SIBO eating plans. The plans vary, and so do individual results. But in general, they tend to recommend limiting carbohydrates.  These include: Sugars and sweeteners. Fruits and starchy vegetables. Dairy products. Grains.  There is a test for this we can do called a breath test, if you are positive we will treat you with an antibiotic to see if it helps.  Your symptoms are very suspicious for this condition, as discussed, we will start you on an antibiotic to see if this helps.   Avoid spicy and acidic foods Avoid fatty foods Limit your intake of coffee, tea, alcohol, and carbonated drinks Work to maintain a healthy weight Keep the head of the bed elevated at least 3 inches  with blocks or a wedge pillow if you are having any nighttime symptoms Stay upright for 2 hours after eating Avoid meals and snacks three to four hours before bedtime   Silent reflux: Not all heartburn burns...SABRASABRASABRA  What is LPR? Laryngopharyngeal reflux (LPR) or silent reflux is a condition in which acid that is made in the stomach travels up the esophagus (swallowing tube) and gets to the throat. Not everyone with reflux has a lot of heartburn or indigestion. In fact, many people with LPR never have heartburn. This is why LPR is called SILENT REFLUX, and the terms Silent reflux and LPR are often used interchangeably. Because LPR is silent, it is sometimes difficult to diagnose.  How can you tell if you have LPR?  Chronic hoarseness- Some people have hoarseness that comes and goes throat clearing  Cough It can cause shortness of breath and cause asthma like symptoms. a feeling of a lump in the throat  difficulty swallowing a problem with too much nose and throat drainage.  Some people will feel their esophagus spasm which feels like their heart beating hard and fast, this will usually be after a meal, at rest, or lying down at night.    How do I treat this? Treatment for LPR should be individualized, and your doctor will suggest the best treatment for you. Generally there are several treatments for LPR: changing habits and diet to reduce reflux,  medications to reduce stomach acid, and  surgery to prevent reflux. Most people with LPR need to modify how and when they eat, as well as take some medication, to get well.  Sometimes, nonprescription liquid antacids, such as Maalox, Gelucil and Mylanta are recommended. When used, these antacids should be taken four times each day - one tablespoon one hour after each meal and before bedtime. Dietary and lifestyle changes alone are not often enough to control LPR - medications that reduce stomach acid are also usually needed. These must be  prescribed by our doctor.   TIPS FOR REDUCING REFLUX AND LPR Control your LIFE-STYLE and your DIET! If you use tobacco, QUIT.  Smoking makes you reflux. After every cigarette you have some LPR.  Don't wear clothing that is too tight, especially around the waist (trousers, corsets, belts).  Do not lie down just after eating...in fact, do not eat within three hours of bedtime.  You should be on a low-fat diet.  Limit your intake of red meat.  Limit your intake of butter.  Avoid fried foods.  Avoid chocolate  Avoid cheese.  Avoid eggs. Specifically avoid caffeine (especially coffee and tea), soda pop (especially cola) and mints.  Avoid alcoholic beverages, particularly in the evening.  You have been scheduled for a CT scan of the abdomen and pelvis at [facility], 1st floor Radiology. You are scheduled on 02/24/24 at 10:30. You should arrive 15 minutes prior to your appointment time for registration.    Please follow the written instructions below on the day of your exam:   1) Do not eat anything after 6:30 am (4 hours prior to your test)   You may take any medications as prescribed with a small amount of water , if necessary. If you take any of the following medications: METFORMIN, GLUCOPHAGE, GLUCOVANCE, AVANDAMET, RIOMET, FORTAMET, ACTOPLUS MET, JANUMET, GLUMETZA or METAGLIP, you MAY be asked to HOLD this medication 48 hours AFTER the exam.   The purpose of you drinking the oral contrast is to aid in the visualization of your intestinal tract. The contrast solution may cause some diarrhea. Depending on your individual set of symptoms, you may also receive an intravenous injection of x-ray contrast/dye. Plan on being at [facility] for 45 minutes or longer, depending on the type of exam you are having performed.   If you have any questions regarding your exam or if you need to reschedule, you may call [facility] at [phone] between the hours of 8:00 am and 5:00 pm,  Monday-Friday.   _______________________________________________________  If your blood pressure at your visit was 140/90 or greater, please contact your primary care physician to follow up on this.  _______________________________________________________  If you are age 36 or older, your body mass index should be between 23-30. Your Body mass index is 26.18 kg/m. If this is out of the aforementioned range listed, please consider follow up with your Primary Care Provider.  If you are age 11 or younger, your body mass index should be between 19-25. Your Body mass index is 26.18 kg/m. If this is out of the aformentioned range listed, please consider follow up with your Primary Care Provider.   ________________________________________________________  The [facility] providers would like to encourage you to use MYCHART to communicate with providers for non-urgent requests or questions.  Due to long hold times on the telephone, sending your provider a message by [facility] may be a faster and more efficient way to get a response.  Please allow 48 business hours for a response.  Please remember that this is for non-urgent requests.  _______________________________________________________  [facility] is using a team-based approach to care.  Your team is  made up of your doctor and two to three APPS. Our APPS (Nurse Practitioners and Physician Assistants) work with your physician to ensure care continuity for you. They are fully qualified to address your health concerns and develop a treatment plan. They communicate directly with your gastroenterologist to care for you. Seeing the Advanced Practice Practitioners on your physician's team can help you by facilitating care more promptly, often allowing for earlier appointments, access to diagnostic testing, procedures, and other specialty referrals.

## 2024-02-17 NOTE — Progress Notes (Signed)
 02/17/2024 Marcus Crawford [id number] Dec 20, 1950  Referring provider: Sebastian Beverley NOVAK, MD Primary GI doctor: Dr. Charlanne ( Dr. Aneita)  ASSESSMENT AND PLAN:  Constipation with abdominal discomfort x 1-2 years worsening over the last year, straining with BM, sticky stools and can be 3-4 days between Bms, has bloating, decreased appetite No melena, no hematochezia, no AB pain No dysphagia, GERD, nausea or vomiting, does have hoarseness 04/30/2022 CT abdomen pelvis with contrast for weight loss unremarkable other than nonobstructive 1.1 cm left nephrolithiasis 11/08/2023 KUB no obstruction Potentially secondary to decreased movement and medications Seen 12/19/2023 started on MiraLAX  and doing well at this time decreased bloating  Will get CT without contrast with symptoms and kidney function consider Linzess versus empiric metronidazole if symptoms return  GERD EGD 2020 sliding HH, gastritis No dysphagia, GERD, nausea or vomiting, does have hoarseness Continue aciphex  every other day  Family history of colon cancer 09/11/2021 colonoscopy for first-degree relative with colon rectal cancer internal hemorrhoids grade 1 otherwise unremarkable  recall 5 years versus no repeat secondary age  CKD stage 3 GFR 39 on 09/30/2023  History of diverticulosis Will call if any symptoms. Add on fiber supplement, avoid NSAIDS, information given  History of prostate cancer  status post prostatectomy  Chronic fatigue syndrome Extensive workup with primary care including metabolic, renal, hepatic, thyroid , and prostate testing, was unremarkable. Elbert Shine virus IgG was positive, indicating past infection, but not necessarily the cause of current fatigue.  Consider neurology referral Following with primary care  B12 deficiency 12/19/2023  HGB 14.1 MCV 89.8 Platelets 169.0 12/19/2023  B12 800 Recent Labs    09/30/23 1334 12/19/23 1010  HGB 13.9 14.1      Patient Care Team: Sebastian Beverley NOVAK, MD as PCP - General (Family Medicine) Renda Glance, MD as Consulting Physician (Urology) Fate Morna SAILOR, [facility] (Inactive) as Pharmacist (Pharmacist) Livingston Rigg, MD as Consulting Physician (Dermatology) Waylan Cain, MD as Consulting Physician (Ophthalmology) Leigh Venetia CROME, MD as Consulting Physician (Neurology)  HISTORY OF PRESENT ILLNESS: 73 y.o. male with a past medical history listed below presents for evaluation of constipation.   Patient last seen 12/19/2023 for constipation with associated abdominal discomfort.  Patient had normal B12 iron and ferritin, normal liver function negative celiac.  Started on MiraLAX   Discussed the use of AI scribe software for clinical note transcription with the patient, who gave verbal consent to proceed.  History of Present Illness          He  reports that he has never smoked. He has never used smokeless tobacco. He reports that he does not currently use alcohol. He reports that he does not use drugs.  RELEVANT GI HISTORY, IMAGING AND LABS: Results          CBC    Component Value Date/Time   WBC 5.5 12/19/2023 1010   RBC 4.73 12/19/2023 1010   HGB 14.1 12/19/2023 1010   HCT 42.5 12/19/2023 1010   PLT 169.0 12/19/2023 1010   MCV 89.8 12/19/2023 1010   MCH 30.7 05/02/2022 1552   MCHC 33.2 12/19/2023 1010   RDW 13.2 12/19/2023 1010   LYMPHSABS 1.1 12/19/2023 1010   MONOABS 0.4 12/19/2023 1010   EOSABS 0.1 12/19/2023 1010   BASOSABS 0.0 12/19/2023 1010   Recent Labs    09/30/23 1334 12/19/23 1010  HGB 13.9 14.1    CMP     Component Value Date/Time   NA 141 12/19/2023 1010   NA 139 06/15/2022  0000   K 4.4 12/19/2023 1010   CL 105 12/19/2023 1010   CO2 30 12/19/2023 1010   GLUCOSE 107 (H) 12/19/2023 1010   BUN 24 (H) 12/19/2023 1010   BUN 30 (A) 06/15/2022 0000   CREATININE 1.56 (H) 12/19/2023 1010   CREATININE 1.46 (H) 01/26/2020 0848   CALCIUM  9.5 12/19/2023 1010   PROT 6.5 12/19/2023 1010    ALBUMIN 4.4 12/19/2023 1010   AST 10 12/19/2023 1010   ALT 19 12/19/2023 1010   ALKPHOS 61 12/19/2023 1010   BILITOT 0.5 12/19/2023 1010   GFRNONAA 55 (L) 05/02/2022 1552   GFRNONAA 48 (L) 01/26/2020 0848   GFRAA 55 (L) 01/26/2020 0848      Latest Ref Rng & Units 12/19/2023   10:10 AM 09/30/2023    1:34 PM 02/06/2023    1:42 PM  Hepatic Function  Total Protein 6.0 - 8.3 g/dL 6.5  6.6  6.2   Albumin 3.5 - 5.2 g/dL 4.4  4.5  4.3   AST 0 - 37 U/L 10  7  8    ALT 0 - 53 U/L 19  17  17    Alk Phosphatase 39 - 117 U/L 61  63  72   Total Bilirubin 0.2 - 1.2 mg/dL 0.5  0.6  0.4   Bilirubin, Direct 0.0 - 0.3 mg/dL  0.1  0.1       Current Medications:    Current Outpatient Medications (Cardiovascular):    olmesartan  (BENICAR ) 20 MG tablet, TAKE 1 TABLET(20 MG) BY MOUTH DAILY   pravastatin  (PRAVACHOL ) 10 MG tablet, TAKE 1 TABLET(10 MG) BY MOUTH DAILY  Current Outpatient Medications (Respiratory):    levocetirizine (XYZAL ) 5 MG tablet, TAKE ONE TABLET BY MOUTH EVERY EVENING   Current Outpatient Medications (Hematological):    cyanocobalamin  (VITAMIN B12) 1000 MCG tablet, Take 1,000 mcg by mouth daily.  Current Outpatient Medications (Other):    cholecalciferol (VITAMIN D ) 1000 UNITS tablet, Take 2,000 Units by mouth daily. Taking 2000 units daily   co-enzyme Q-10 30 MG capsule, Take 100 mg by mouth 2 (two) times daily.   cycloSPORINE (RESTASIS) 0.05 % ophthalmic emulsion, 1 drop 2 (two) times daily.   docusate sodium  (COLACE) 100 MG capsule, Take 1 capsule (100 mg total) by mouth every 12 (twelve) hours.   DULoxetine  (CYMBALTA ) 30 MG capsule, Take 1 capsule (30 mg total) by mouth daily.   gabapentin  (NEURONTIN ) 100 MG capsule, Take 1 capsule (100 mg total) by mouth 2 (two) times daily.   magnesium  citrate solution, Take 296 mLs by mouth once.   polyethylene glycol powder (MIRALAX ) 17 GM/SCOOP powder, Take 17 g by mouth daily as needed for severe constipation or moderate constipation.    Polyvinyl Alcohol-Povidone (REFRESH OP), Place 1 drop into both eyes 4 (four) times daily as needed (dryness).   QUEtiapine  (SEROQUEL ) 25 MG tablet, Take 1 tablet (25 mg total) by mouth at bedtime.   RABEprazole  (ACIPHEX ) 20 MG tablet, TAKE ONE TABLET BY MOUTH ONE TIME DAILY (Patient taking differently: Take 20 mg by mouth every other day.)   traZODone  (DESYREL ) 50 MG tablet, Take 1 tablet (50 mg total) by mouth at bedtime.  Medical History:  Past Medical History:  Diagnosis Date   Anemia    early 20's   Bell's palsy 1977   Blood clot in vein 2005   left leg blood clot-treated as OP; no residual   Blood transfusion without reported diagnosis    with appendectomy   Borderline diabetic  no meds   Cancer (HCC) 08/2013   prostate/ had surgery/ no chemo or radiation   Cataract    forming   CKD (chronic kidney disease) stage 3, GFR 30-59 ml/min (HCC) saw dr webb 6 months ago   on rampiril for kidney function also   Colonic polyp    X3 ,hyperplastic   Diverticulosis of colon    GERD (gastroesophageal reflux disease)    H/O hiatal hernia    Hemorrhoid    Hyperlipidemia    Hypertension    past hx - no meds since 2013 with  adrenal gland surgery   Kidney stone on left side 2013   asymptomatic , incidental finding   Migraine last 6-7 yrs ago   PMH of ; resolved post adrenal adenoma resection   Nephrolithiasis 2013   kidney stone  as incidental finding on imaging   PONV (postoperative nausea and vomiting)    Allergies:  Allergies  Allergen Reactions   Penicillins Rash    Rash (RN clarified with pt) No SOB/swelling   Sulfa Antibiotics Rash     Surgical History:  He  has a past surgical history that includes Esophageal dilation (2001); Colonoscopy; Polypectomy; Adrenalectomy (04/14/2012); Appendectomy (1985); adrenal venous sampling ([id number]); and Robot assisted laparoscopic radical prostatectomy (N/A, 09/07/2013). Family History:  His family history includes Alzheimer's  disease in his sister; Breast cancer in his sister; Colon cancer (age of onset: 6) in his mother; Colon polyps in his sister; Diabetes in his brother and sister; Esophageal cancer in his maternal uncle; Heart attack (age of onset: 22) in his brother; Heart attack (age of onset: 55) in his mother; Heart disease in his brother; Heart failure in his brother; Hypertension in his brother; Kidney cancer in his brother, brother, and maternal uncle; Lung cancer in his sister; Prostate cancer (age of onset: 47) in his brother; Rectal cancer in his mother.  REVIEW OF SYSTEMS  : All other systems reviewed and negative except where noted in the History of Present Illness.  PHYSICAL EXAM: BP 130/70   Pulse 80   Ht 6' (1.829 m)   Wt 193 lb (87.5 kg)   BMI 26.18 kg/m  Physical Exam          Alan JONELLE Coombs, PA-C 3:53 PM

## 2024-02-18 ENCOUNTER — Ambulatory Visit: Admitting: Physician Assistant

## 2024-02-24 ENCOUNTER — Ambulatory Visit (HOSPITAL_COMMUNITY)
Admission: RE | Admit: 2024-02-24 | Discharge: 2024-02-24 | Disposition: A | Discharge status: Home / Self Care | Source: Ambulatory Visit | Attending: Physician Assistant | Admitting: Physician Assistant

## 2024-02-24 DIAGNOSIS — K5904 Chronic idiopathic constipation: Secondary | ICD-10-CM | POA: Diagnosis not present

## 2024-02-24 DIAGNOSIS — G9332 Myalgic encephalomyelitis/chronic fatigue syndrome: Secondary | ICD-10-CM | POA: Diagnosis not present

## 2024-02-24 DIAGNOSIS — R109 Unspecified abdominal pain: Secondary | ICD-10-CM | POA: Insufficient documentation

## 2024-02-24 DIAGNOSIS — D8989 Other specified disorders involving the immune mechanism, not elsewhere classified: Secondary | ICD-10-CM | POA: Insufficient documentation

## 2024-02-24 DIAGNOSIS — N281 Cyst of kidney, acquired: Secondary | ICD-10-CM | POA: Diagnosis not present

## 2024-02-24 DIAGNOSIS — N200 Calculus of kidney: Secondary | ICD-10-CM | POA: Diagnosis not present

## 2024-02-26 ENCOUNTER — Other Ambulatory Visit: Payer: Self-pay | Admitting: Family Medicine

## 2024-02-26 DIAGNOSIS — I10 Essential (primary) hypertension: Secondary | ICD-10-CM

## 2024-02-27 ENCOUNTER — Other Ambulatory Visit: Payer: Self-pay

## 2024-02-27 DIAGNOSIS — I10 Essential (primary) hypertension: Secondary | ICD-10-CM

## 2024-02-27 MED ORDER — OLMESARTAN MEDOXOMIL 20 MG PO TABS: 20.0000 mg | ORAL_TABLET | Freq: Every day | ORAL | 0 refills | Status: DC

## 2024-02-27 NOTE — Telephone Encounter (Signed)
 Noted. RX was refilled and sent to pharmacy.

## 2024-03-05 DIAGNOSIS — G252 Other specified forms of tremor: Secondary | ICD-10-CM | POA: Diagnosis not present

## 2024-03-05 DIAGNOSIS — R253 Fasciculation: Secondary | ICD-10-CM | POA: Diagnosis not present

## 2024-03-05 DIAGNOSIS — G9331 Postviral fatigue syndrome: Secondary | ICD-10-CM | POA: Diagnosis not present

## 2024-03-05 DIAGNOSIS — G9332 Myalgic encephalomyelitis/chronic fatigue syndrome: Secondary | ICD-10-CM | POA: Diagnosis not present

## 2024-03-05 DIAGNOSIS — R7303 Prediabetes: Secondary | ICD-10-CM | POA: Diagnosis not present

## 2024-03-05 DIAGNOSIS — Z9889 Other specified postprocedural states: Secondary | ICD-10-CM | POA: Diagnosis not present

## 2024-03-09 ENCOUNTER — Ambulatory Visit: Payer: Self-pay | Admitting: Physician Assistant

## 2024-03-10 DIAGNOSIS — Z9889 Other specified postprocedural states: Secondary | ICD-10-CM | POA: Diagnosis not present

## 2024-03-10 DIAGNOSIS — G9332 Myalgic encephalomyelitis/chronic fatigue syndrome: Secondary | ICD-10-CM | POA: Diagnosis not present

## 2024-03-10 DIAGNOSIS — R7303 Prediabetes: Secondary | ICD-10-CM | POA: Diagnosis not present

## 2024-03-12 ENCOUNTER — Ambulatory Visit (HOSPITAL_BASED_OUTPATIENT_CLINIC_OR_DEPARTMENT_OTHER): Admitting: Psychiatry

## 2024-03-12 ENCOUNTER — Encounter (HOSPITAL_COMMUNITY): Payer: Self-pay | Admitting: Psychiatry

## 2024-03-12 VITALS — BP 106/69 | HR 80 | Resp 18 | Ht 72.0 in | Wt 196.4 lb

## 2024-03-12 DIAGNOSIS — G9332 Myalgic encephalomyelitis/chronic fatigue syndrome: Secondary | ICD-10-CM

## 2024-03-12 DIAGNOSIS — G4733 Obstructive sleep apnea (adult) (pediatric): Secondary | ICD-10-CM | POA: Diagnosis not present

## 2024-03-12 DIAGNOSIS — F419 Anxiety disorder, unspecified: Secondary | ICD-10-CM | POA: Diagnosis not present

## 2024-03-12 MED ORDER — DULOXETINE HCL 30 MG PO CPEP: 30.0000 mg | ORAL_CAPSULE | Freq: Every day | ORAL | 0 refills | Status: DC

## 2024-03-12 MED ORDER — GABAPENTIN 100 MG PO CAPS: 100.0000 mg | ORAL_CAPSULE | Freq: Two times a day (BID) | ORAL | 0 refills | Status: DC

## 2024-03-12 NOTE — Progress Notes (Signed)
 [facility] MD/PA/NP OP Progress Note  Patient location; office Provider location; office  03/12/2024 1:25 PM Marcus Crawford  MRN:  [id number]  Chief Complaint:  Chief Complaint  Patient presents with   Anxiety   Follow-up   Medication Refill   HPI: Patient came today to the office for his follow-up appointment.  We started him on gabapentin  and is taking 100 mg twice a day.  He noticed improvement in his energy level, anxiety and he is more upbeat.  He started walking every day.  He denies any crying spells or any feeling of hopelessness.  He does not feel nervous or anxious around people but is still disappointed about her chronic fatigue and lack of motivation.  He has a visit with his doctor at [facility].  He was given advised to take supplements and recommend to have blood work.  He has not started the vitamins and supplements yet.  Like to figure further and explore further if it is necessary.  He is taking Cymbalta  30 mg.  In the past he had tried cutting down but had significant withdrawal symptoms.  He has appointment coming up to see his neurologist Dr. Venus in few weeks.  He sleeps at least 7 hours.  He uses sleep apnea machine.  He denies drinking or using any illegal substances.  Denies any suicidal thoughts, hopelessness, worthlessness.  His biggest concern is fatigue and lack of energy.  His appetite is okay.  Weight is unchanged from the past.    ICD-10-CM   1. Chronic fatigue disorder  G93.32 DULoxetine  (CYMBALTA ) 30 MG capsule    2. Anxiety  F41.9 gabapentin  (NEURONTIN ) 100 MG capsule    DULoxetine  (CYMBALTA ) 30 MG capsule    3. OSA (obstructive sleep apnea)  G47.33 DULoxetine  (CYMBALTA ) 30 MG capsule       Past Psychiatric History: No history of suicidal attempt, mania, psychosis, delusion, PTSD or inpatient services.  Started taking antidepressant last year from primary care.  He was given Seroquel , Cymbalta  and Concerta .  Unexplained fatigue after the episode while doing the lawn  more.  Had a few ER visit for extreme fatigue.  Seroquel  was discontinued by this Clinical research associate.  Past Medical History:  Past Medical History:  Diagnosis Date   Anemia    early 20's   Bell's palsy 1977   Blood clot in vein 2005   left leg blood clot-treated as OP; no residual   Blood transfusion without reported diagnosis    with appendectomy   Borderline diabetic    no meds   Cancer (HCC) 08/2013   prostate/ had surgery/ no chemo or radiation   Cataract    forming   CKD (chronic kidney disease) stage 3, GFR 30-59 ml/min (HCC) saw dr douglass 6 months ago   on rampiril for kidney function also   Colonic polyp    X3 ,hyperplastic   Diverticulosis of colon    GERD (gastroesophageal reflux disease)    H/O hiatal hernia    Hemorrhoid    Hyperlipidemia    Hypertension    past hx - no meds since 2013 with  adrenal gland surgery   Kidney stone on left side 2013   asymptomatic , incidental finding   Migraine last 6-7 yrs ago   PMH of ; resolved post adrenal adenoma resection   Nephrolithiasis 2013   kidney stone  as incidental finding on imaging   PONV (postoperative nausea and vomiting)     Past Surgical History:  Procedure Laterality Date  adrenal venous sampling  03-2012   ADRENALECTOMY  04/14/2012   Procedure: ADRENALECTOMY;  Surgeon: Lynda Leos, MD;  Location: [facility];  Service: General;  Laterality: Left;  Left Adrenal Excision   APPENDECTOMY  1985   COLONOSCOPY     last 9/12, Dr Aneita; due 2017   ESOPHAGEAL DILATION  2001   Dr Cloretta   POLYPECTOMY      X 3   ROBOT ASSISTED LAPAROSCOPIC RADICAL PROSTATECTOMY N/A 09/07/2013   Procedure: ROBOTIC ASSISTED LAPAROSCOPIC RADICAL PROSTATECTOMY LEVEL 1;  Surgeon: Noretta Ferrara, MD;  Location: [facility];  Service: Urology;  Laterality: N/A;    Family Psychiatric History: Reviewed  Family History:  Family History  Problem Relation Age of Onset   Rectal cancer Mother    Colon cancer Mother 93   Heart attack Mother 32   Colon  polyps Sister    Lung cancer Sister        smoker   Diabetes Sister    Breast cancer Sister    Alzheimer's disease Sister    Heart failure Brother    Kidney cancer Brother    Prostate cancer Brother 29   Heart disease Brother    Hypertension Brother    Kidney cancer Brother    Diabetes Brother    Heart attack Brother 40   Esophageal cancer Maternal Uncle    Kidney cancer Maternal Uncle    Stomach cancer Neg Hx     Social History:  Social History   Socioeconomic History   Marital status: Married    Spouse name: Adrien   Number of children: 0   Years of education: Not on file   Highest education level: Some college, no degree  Occupational History   Occupation: retired  Tobacco Use   Smoking status: Never   Smokeless tobacco: Never  Vaping Use   Vaping status: Never Used  Substance and Sexual Activity   Alcohol use: Not Currently   Drug use: No   Sexual activity: Yes  Other Topics Concern   Not on file  Social History Narrative   Right handed        Are you currently employed ?    What is your current occupation?retired   Do you live at home alone?   Who lives with you?    What type of home do you live in: 1 story or 2 story?    Caffiene  1 daily   Social Drivers of Corporate investment banker Strain: Low Risk  (02/03/2024)   Overall Financial Resource Strain (CARDIA)    Difficulty of Paying Living Expenses: Not hard at all  Food Insecurity: No Food Insecurity (02/03/2024)   Hunger Vital Sign    Worried About Running Out of Food in the Last Year: Never true    Ran Out of Food in the Last Year: Never true  Transportation Needs: Unknown (02/03/2024)   PRAPARE - Administrator, Civil Service (Medical): Patient declined    Lack of Transportation (Non-Medical): No  Physical Activity: Inactive (02/03/2024)   Exercise Vital Sign    Days of Exercise per Week: 0 days    Minutes of Exercise per Session: Not on file  Stress: Stress Concern Present (05/11/2023)    [employer] of Occupational Health - Occupational Stress Questionnaire    Feeling of Stress : To some extent  Social Connections: Moderately Integrated (02/03/2024)   Social Connection and Isolation Panel    Frequency of Communication with Friends and Family: Three  times a week    Frequency of Social Gatherings with Friends and Family: Once a week    Attends Religious Services: Never    Database administrator or Organizations: Yes    Attends Banker Meetings: Never    Marital Status: Married    Allergies:  Allergies  Allergen Reactions   Penicillins Rash    Rash (RN clarified with pt) No SOB/swelling   Sulfa Antibiotics Rash    Metabolic Disorder Labs: Lab Results  Component Value Date   HGBA1C 6.0 09/30/2023   MPG 117 01/26/2020   Lab Results  Component Value Date   PROLACTIN 5.6 02/20/2022   Lab Results  Component Value Date   CHOL 152 09/30/2023   TRIG 210.0 (H) 09/30/2023   HDL 42.00 09/30/2023   CHOLHDL 4 09/30/2023   VLDL 42.0 (H) 09/30/2023   LDLCALC 68 09/30/2023   LDLCALC 115 (H) 01/26/2020   Lab Results  Component Value Date   TSH 1.90 09/30/2023   TSH 2.11 11/06/2022    Therapeutic Level Labs: No results found for: LITHIUM No results found for: VALPROATE No results found for: CBMZ  Current Medications: Current Outpatient Medications  Medication Sig Dispense Refill   cholecalciferol (VITAMIN D ) 1000 UNITS tablet Take 2,000 Units by mouth daily. Taking 2000 units daily     co-enzyme Q-10 30 MG capsule Take 100 mg by mouth 2 (two) times daily.     cyanocobalamin  (VITAMIN B12) 1000 MCG tablet Take 1,000 mcg by mouth daily.     cycloSPORINE (RESTASIS) 0.05 % ophthalmic emulsion 1 drop 2 (two) times daily.     docusate sodium  (COLACE) 100 MG capsule Take 1 capsule (100 mg total) by mouth every 12 (twelve) hours. 60 capsule 0   DULoxetine  (CYMBALTA ) 30 MG capsule Take 1 capsule (30 mg total) by mouth daily. 30 capsule 0    gabapentin  (NEURONTIN ) 100 MG capsule Take 1 capsule (100 mg total) by mouth 2 (two) times daily. 60 capsule 0   levocetirizine (XYZAL ) 5 MG tablet TAKE ONE TABLET BY MOUTH EVERY EVENING 90 tablet 1   olmesartan  (BENICAR ) 20 MG tablet Take 1 tablet (20 mg total) by mouth daily. 90 tablet 0   polyethylene glycol powder (MIRALAX ) 17 GM/SCOOP powder Take 17 g by mouth daily as needed for severe constipation or moderate constipation. 255 g 0   Polyvinyl Alcohol-Povidone (REFRESH OP) Place 1 drop into both eyes 4 (four) times daily as needed (dryness).     pravastatin  (PRAVACHOL ) 10 MG tablet TAKE 1 TABLET(10 MG) BY MOUTH DAILY 90 tablet 0   QUEtiapine  (SEROQUEL ) 25 MG tablet Take 1 tablet (25 mg total) by mouth at bedtime. 90 tablet 3   RABEprazole  (ACIPHEX ) 20 MG tablet TAKE ONE TABLET BY MOUTH ONE TIME DAILY 90 tablet 1   traZODone  (DESYREL ) 50 MG tablet Take 1 tablet (50 mg total) by mouth at bedtime. 90 tablet 3   magnesium  citrate solution Take 296 mLs by mouth once.     No current facility-administered medications for this visit.   Psychiatric Specialty Exam: Physical Exam  Review of Systems  Constitutional:  Positive for fatigue.  Neurological:  Positive for tremors.  Psychiatric/Behavioral:  The patient is nervous/anxious.     Blood pressure 106/69, pulse 80, resp. rate 18, height 6' (1.829 m), weight 196 lb 6.4 oz (89.1 kg).Body mass index is 26.64 kg/m.  General Appearance: Casual  Eye Contact:  Fair  Speech:  Slow  Volume:  Decreased  Mood:  Anxious  Affect:  Appropriate  Thought Process:  Goal Directed  Orientation:  Full (Time, Place, and Person)  Thought Content:  WDL  Suicidal Thoughts:  No  Homicidal Thoughts:  No  Memory:  Immediate;   Good Recent;   Fair Remote;   Fair  Judgement:  Fair  Insight:  Shallow  Psychomotor Activity:  Decreased  Concentration:  Concentration: Fair and Attention Span: Fair  Recall:  [employer] of Knowledge:  Good  Language:  Good   Akathisia:  No  Handed:  Right  AIMS (if indicated):     Assets:  Communication Skills Desire for Improvement Housing Social Support Transportation  ADL's:  Intact  Cognition:  WNL  Sleep:   ok with CPAP        Screenings: GAD-7    Flowsheet Row Office Visit from 02/06/2024 in [facility] [employer] [facility] Visit from 06/27/2023 in [facility] [place] Office Visit from [facility]/21/20[facility]  Total GAD-7 Score 2 1 1    PHQ2-9    Flowsheet Row Office Visit from 02/06/2024 in [facility] Visit from 09/30/2023 in [facility] [place] Office Visit from 06/27/2023 in [facility] [place] Office Visit from 03[facility]21/2023 in [facility] Clinical Support from [facility]/07/20[facility]  PHQ-2 Total Score 0 0 0 0 0  PHQ-9 Total Score 3 0 3 4 5    Flowsheet Row UC from 11/08/2023 in [facility] Office Visit from 03[facility]21/2023 in [facility] ED from 05/02/2022 in [facility] Department at [facility]  C-SSRS RISK CATEGORY No Risk No Risk No Risk     Assessment and Plan:  Patient is 73 year old Caucasian man with a history of chronic fatigue, hypertension, sleep apnea, chronic kidney disease, anxiety, chronic insomnia and uses CPAP machine.  Reviewed collateral information from other provider.  Patient was recommended to take supplements including magnesium  however he has not started yet.  Emphasis given that follow the recommendation of specialist.  Patient like to gabapentin  because it is helping his anxiety and he is doing more than he he was doing before.  He has no tremors or shakes.  He is taking trazodone  and Seroquel  prescribed by PCP.   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  He did not bring the paper with him.  I encouraged to have discussed with his neurologist about the latter which he received from [facility] doctor.  Patient ask about lowering the Cymbalta  however I recommend not to reduce the dose because in the past he had withdrawal symptoms and his anxiety come back.  Continue Cymbalta  30 mg daily and gabapentin  100 mg twice a day.  Encouraged to keep appointment with neurology.  Recommend to call back if is any question or any concern.  Follow-up in 3 months.   Collaboration of Care: Collaboration of Care: Other provider involved in patient's care AEB I will forward my note to primary care.  Patient/Guardian was advised Release of Information must be obtained prior to any record release in order to collaborate their care with an outside provider. Patient/Guardian was advised if they have not already done so to contact the registration department to sign all necessary forms in order for  us  to release information regarding their care.   Consent: Patient/Guardian gives verbal consent for treatment and assignment of benefits for services provided during this visit. Patient/Guardian expressed understanding and agreed to proceed.   I spent 27 minutes face-to-face time with the patient during this encounter.  Leni ONEIDA Client, MD 03/12/2024, 1:25 PM

## 2024-03-16 ENCOUNTER — Other Ambulatory Visit: Payer: Self-pay | Admitting: Internal Medicine

## 2024-03-16 ENCOUNTER — Other Ambulatory Visit (HOSPITAL_COMMUNITY): Payer: Self-pay | Admitting: Psychiatry

## 2024-03-16 DIAGNOSIS — G4733 Obstructive sleep apnea (adult) (pediatric): Secondary | ICD-10-CM

## 2024-03-16 DIAGNOSIS — E785 Hyperlipidemia, unspecified: Secondary | ICD-10-CM

## 2024-03-16 DIAGNOSIS — F419 Anxiety disorder, unspecified: Secondary | ICD-10-CM

## 2024-03-16 DIAGNOSIS — G9332 Myalgic encephalomyelitis/chronic fatigue syndrome: Secondary | ICD-10-CM

## 2024-03-18 ENCOUNTER — Other Ambulatory Visit: Payer: Self-pay | Admitting: Family Medicine

## 2024-03-18 DIAGNOSIS — E785 Hyperlipidemia, unspecified: Secondary | ICD-10-CM

## 2024-03-19 MED ORDER — PRAVASTATIN SODIUM 10 MG PO TABS: 10.0000 mg | ORAL_TABLET | Freq: Every day | ORAL | 3 refills | Status: AC

## 2024-03-19 NOTE — Addendum Note (Signed)
 Addended by: SEBASTIAN RIGHTER B on: 03/19/2024 01:08 PM   Modules accepted: Orders

## 2024-04-03 NOTE — Progress Notes (Signed)
 NEUROLOGY FOLLOW UP OFFICE NOTE  EDI GORNIAK [id number]  Subjective:  Marcus Crawford is a 73 y.o. year old male with a history of HTN, HLD, CKD, pre-diabetes, vit D deficiency, prostate cancer, bursitis of right hip, RBBB, Bell's Palsy c/b chronic right face droop, hypogonadism, and fatigue who we last saw on 06/06/23 for tremor and fatigue.  To briefly review: Initial consultation (04/16/22): Patient has been having progressive weakness since May or June of 2023. He first noticed that when he did anything, he would get tired very quickly. He denies weakness at first, but just wouldn't have energy to go very long. He would have to rest to get his energy up again. He may have started noticing his stride while walking was changing for the last year. He is not sure how he was walking different, but noticed he was.   Patient feels weak in his arms and legs. He mentioned that about August 2023 he noticed leg weakness and September 2023 he started noticing his arms felt funny and weak. He does not notice fluctuating of his weakness. He feels his strength is so poor and his fatigue is so much that he ends up laying around than being upright. He has lost 15-20 pounds over this time period as well. He says he has no appetite. He does not feel he can think straight. He feels like he has brain fog. He also feels like his vision is affected. He has dry eyes and feels like his vision is poor. He denies double vision. He also feels like he cannot sit still. He is restless. He mentions that his hand writing is poorer than previous and perhaps smaller.   He is sleeping very poorly. He takes ambien  to help him sleep but he is still only sleeping from 9:30pm to 3:30am (~6 hours per night). He feels rested but only for a short period of time. This has been going on for about 2 months. He thinks he snores a lot. He had a sleep study scheduled a couple of weeks ago, but this was not completed because per patient, he  wasn't in good enough health, so it was cancelled.    He endorses poor mood as well. His is upset about not only his condition but also his wife. She is going through cancer treatments.   Patient states he feels so bad that he feels like he is dying.   Current MG like symptoms: Ptosis: He thinks he occasionally has droopy eyelids, but maybe this has been going on for a long time. Double vision: Not clear Speech: Patient feels like his voice is changing. He feels hoarse. This comes and goes. He thinks it is worse when he gets upset or if he is tired or using his voice a lot. Chewing: None Swallowing: None Breathing: None Arm strength: Feels weak. Harder to brush teeth. Not dropping things.  Leg strength: Feels weak. Feels like he is taking shorter chopper steps per patient.    He currently takes vitamin D  1000 IU per day for several years. He takes no other vitamins.   The patient has not had similar episodes of symptoms in the past.    He endorses some tingling on the bottom of his feet occasionally and has occasional cramping.    Any change in urine color, especially after exertion/physical activity? No   Pseudobulbar affect is absent, but patient does endorse being more emotional.   The patient has not  noticed any recent skin  rashes nor does he report any constitutional symptoms like fever, night sweats. He endorses anorexia and weight loss as above.   EtOH use: None  Restrictive diet? No Family history of neuropathy/myopathy/NM disease? None   Patient presented to the ED on 04/10/22 for weakness. Per documentation, symptoms have been present for the last year, but progressed over the last month. He has lost 15 pounds over the last few months due to poor appetite. He feels very fatigued. There was concern for myasthenia gravis, so neurology was consulted. Neurology was not concerned for MG crisis, so sent the antibodies and recommended outpatient follow up.   04/24/22: AchR  binding abs and striated muscle abs were negative. B1 was elevated to 45.   Patient continues to feel very fatigued and weak. We spoke by phone on 04/23/22, and patient was very concerned about his symptoms, so this follow up appointment was made.   Patient today says he does not feel depressed. He was prescribed a medication for depression but did not take it.   He also has poor sleep. He was given ambien  which he took last night and slept 7 hours. He is worried about getting addicted to the medication though.   11/14/22: Labs returned normal. Patient cancelled EMG.    He did see sleep medicine. He is still awaiting CPAP.   He saw his PCP on 11/06/22 with similar complaints of fatigue, daytime sleepiness, apathy, anhedonia, and feeling shaky. Course tremors were noted, so patient was referred back to me today.    Patient mentions that he has noticed this more with stress. He notices it occurs with action. When he is putting down his glasses or eating cereal. When he thinks about it, he will notice it more (like thinking about it makes it worse). He was recently restarted on methylphenidate  and thinks this has helped how he is feeling and tremors.   He denies freezing. He denies rest tremor. He denies significant imbalance or falls. He endorses poor smell for 20-30 years. He may have had some kicking in his sleep in the past, but nothing currently that sounds like REM sleep behavior.   He has no family history of tremor.   Relevant current medications: -Cymbalta  60 mg daily (tremor in 2-3%) -Methylphenidate  18 mg daily (jitteriness in 4%, tremor in 3%) -Seroquel  25 mg daily (parkinsonism in < 6%, tremor in 2%) -Trazodone  50 mg daily (nervousness in 15%, tremor in 3-5%)   06/06/23: Patient saw psych who did not think patient had depressive symptoms but wondered about a viral infection per patient.    He continues to feel very low energy. He feels a pressure in his chest and arms as well. He  has brain fog. Symptoms started around 11/2021. He does not remember any infection or insect bites.   Patient continues to have occasional tremor, but only when stressed. His wife is going through bone cancer treatment, so he has more responsibilities. The tremor does not affect eating or drinking too much.  Most recent Assessment and Plan (06/06/23): This is Marcus Crawford, a 73 y.o. male with chronic fatigue, occasional tremor with movement or stress, generalized feeling of being unwell. I have seen patient since 04/16/22 from similar complaints without clear etiology. MRI thoracic spine from 03/2023 showed lesions in T6 and T12 vertebrae that are being worked up further to rule out metastatic or primary bone lesions. This is the most concerning potential etiology. Patient has flat affect and decreased motivation but per patient  and psychiatry, he does not have depressive symptoms. He has not improved on Cymbalta  and Seroquel . Parkinsonism is also possible, but patient does not have bradykinesia required for the diagnosis. I will continue to monitor for this. There was concern for a viral infection, so I will recheck labs with Lyme and EBV today.    Plan: -Blood work: Lyme, EBV, B12 -Follow up MRI thoracic spine as planned on 06/20/23 -Continue Cymbalta  60 mg daily -Continue CPAP for OSA  Since their last visit: B12 was borderline low so I recommended B12 1000 mcg daily. Lyme was negative. EBV testing showed remote infection, not active. MRI thoracic spine was unremarkable.   Patient saw [facility] medicine at [facility] who diagnosed patient with chronic fatigue syndrome. He is now on a long list of supplements that he thinks has helped some. He feels he was feeling better until 10/2023 and then felt worse. This was around the time he stopped Cymbalta . He is now taking 30 mg daily. He is still taking seroquel  25 mg daily.  He still has some tremors when using his hands like eating or drinking. He denies  rest tremor. He will sometimes spill things.  When asked about freezing when trying to move today, he does endorse sometimes feeling like it is difficult to move when in shower. He denies any freezing when trying to walk. He denies imbalance or falls.    MEDICATIONS:  Outpatient Encounter Medications as of 04/17/2024  Medication Sig   Acetylcarnitine HCl, Nutrient, (ACETYL L-CARNITINE HCL) 500 MG CAPS Take 500 mg by mouth once.   carbidopa-levodopa (SINEMET) 10-100 MG tablet Take 1/2 tablet three times daily, at least 30 minutes before meals (approximately 7am/11am/4pm) for one week, then take 1/2 tablet in the morning, 1/2 tablet in the afternoon, 1 tablet in the evening for one week, then take 1/2 tablet in the morning, 1 tablet in the afternoon, 1 tablet in the evening for one week, then take 1 tablet three times daily thereafter   cholecalciferol (VITAMIN D ) 1000 UNITS tablet Take 2,000 Units by mouth daily. Taking 2000 units daily   co-enzyme Q-10 30 MG capsule Take 100 mg by mouth 2 (two) times daily. (Patient taking differently: Take 200 mg by mouth 1 day or 1 dose.)   cyanocobalamin  (VITAMIN B12) 1000 MCG tablet Take 1,000 mcg by mouth daily.   cycloSPORINE (RESTASIS) 0.05 % ophthalmic emulsion 1 drop 2 (two) times daily.   D-RIBOSE PO Take 5 mg by mouth in the morning and at bedtime.   docusate sodium  (COLACE) 100 MG capsule Take 1 capsule (100 mg total) by mouth every 12 (twelve) hours.   DULoxetine  (CYMBALTA ) 30 MG capsule Take 1 capsule (30 mg total) by mouth daily.   gabapentin  (NEURONTIN ) 100 MG capsule Take 1 capsule (100 mg total) by mouth 2 (two) times daily.   levocetirizine (XYZAL ) 5 MG tablet TAKE ONE TABLET BY MOUTH EVERY EVENING (Patient taking differently: Take 5 mg by mouth as needed.)   magnesium  citrate solution Take 296 mLs by mouth once. (Patient taking differently: Take 296 mLs by mouth in the morning and at bedtime.)   NADH POWD 10 mg by Does not apply route once.    olmesartan  (BENICAR ) 20 MG tablet Take 1 tablet (20 mg total) by mouth daily.   polyethylene glycol powder (MIRALAX ) 17 GM/SCOOP powder Take 17 g by mouth daily as needed for severe constipation or moderate constipation.   Polyvinyl Alcohol-Povidone (REFRESH OP) Place 1 drop into both eyes 4 (  four) times daily as needed (dryness).   pravastatin  (PRAVACHOL ) 10 MG tablet Take 1 tablet (10 mg total) by mouth daily.   QUEtiapine  (SEROQUEL ) 25 MG tablet Take 1 tablet (25 mg total) by mouth at bedtime.   RABEprazole  (ACIPHEX ) 20 MG tablet TAKE ONE TABLET BY MOUTH ONE TIME DAILY (Patient taking differently: Take 20 mg by mouth every other day.)   traZODone  (DESYREL ) 50 MG tablet Take 1 tablet (50 mg total) by mouth at bedtime.   Turmeric (CURCUMIN 95) 500 MG CAPS Take 1,000 mg by mouth once.   No facility-administered encounter medications on file as of 04/17/2024.    PAST MEDICAL HISTORY: Past Medical History:  Diagnosis Date   Anemia    early 20's   Bell's palsy 1977   Blood clot in vein 2005   left leg blood clot-treated as OP; no residual   Blood transfusion without reported diagnosis    with appendectomy   Borderline diabetic    no meds   Cancer (HCC) 08/2013   prostate/ had surgery/ no chemo or radiation   Cataract    forming   CKD (chronic kidney disease) stage 3, GFR 30-59 ml/min (HCC) saw dr douglass 6 months ago   on rampiril for kidney function also   Colonic polyp    X3 ,hyperplastic   Diverticulosis of colon    GERD (gastroesophageal reflux disease)    H/O hiatal hernia    Hemorrhoid    Hyperlipidemia    Hypertension    past hx - no meds since 2013 with  adrenal gland surgery   Kidney stone on left side 2013   asymptomatic , incidental finding   Migraine last 6-7 yrs ago   PMH of ; resolved post adrenal adenoma resection   Nephrolithiasis 2013   kidney stone  as incidental finding on imaging   PONV (postoperative nausea and vomiting)     PAST SURGICAL  HISTORY: Past Surgical History:  Procedure Laterality Date   adrenal venous sampling  03-2012   ADRENALECTOMY  04/14/2012   Procedure: ADRENALECTOMY;  Surgeon: Lynda Leos, MD;  Location: [facility];  Service: General;  Laterality: Left;  Left Adrenal Excision   APPENDECTOMY  1985   COLONOSCOPY     last 9/12, Dr Aneita; due 2017   ESOPHAGEAL DILATION  2001   Dr Cloretta   POLYPECTOMY      X 3   ROBOT ASSISTED LAPAROSCOPIC RADICAL PROSTATECTOMY N/A 09/07/2013   Procedure: ROBOTIC ASSISTED LAPAROSCOPIC RADICAL PROSTATECTOMY LEVEL 1;  Surgeon: Noretta Ferrara, MD;  Location: [facility];  Service: Urology;  Laterality: N/A;    ALLERGIES: Allergies  Allergen Reactions   Penicillins Rash    Rash (RN clarified with pt) No SOB/swelling   Sulfa Antibiotics Rash    FAMILY HISTORY: Family History  Problem Relation Age of Onset   Rectal cancer Mother    Colon cancer Mother 23   Heart attack Mother 77   Colon polyps Sister    Lung cancer Sister        smoker   Diabetes Sister    Breast cancer Sister    Alzheimer's disease Sister    Heart failure Brother    Kidney cancer Brother    Prostate cancer Brother 3   Heart disease Brother    Hypertension Brother    Kidney cancer Brother    Diabetes Brother    Heart attack Brother 56   Esophageal cancer Maternal Uncle    Kidney cancer Maternal Uncle  Stomach cancer Neg Hx     SOCIAL HISTORY: Social History   Tobacco Use   Smoking status: Never   Smokeless tobacco: Never  Vaping Use   Vaping status: Never Used  Substance Use Topics   Alcohol use: Not Currently   Drug use: No   Social History   Social History Narrative   Right handed        Are you currently employed ?    What is your current occupation?retired   Do you live at home alone?   Who lives with you?    What type of home do you live in: 1 story or 2 story?    Caffiene  1 daily      Objective:  Vital Signs:  BP 119/77   Pulse 80   Ht 6' (1.829 m)   Wt 195 lb  (88.5 kg)   SpO2 97%   BMI 26.45 kg/m   General: General appearance: Awake and alert. No distress. Cooperative with exam.  Skin: No obvious rash or jaundice. HEENT: Atraumatic. Anicteric. Lungs: Non-labored breathing on room air  Extremities: No edema. Psych: Flat affect.  Neurological: Mental Status: Alert. Speech fluent. No pseudobulbar affect Cranial Nerves: CNII: No RAPD. Visual fields intact. CNIII, IV, VI: PERRL. No nystagmus. EOMI. CN V: Facial sensation intact bilaterally to fine touch. CN VII: Facial muscles symmetric and strong. No ptosis at rest. CN VIII: Hears finger rub well bilaterally. CN IX: No hypophonia. CN X: Palate elevates symmetrically. CN XI: Full strength shoulder shrug bilaterally. CN XII: Tongue protrusion full and midline. No atrophy or fasciculations. No significant dysarthria Motor: Tone is normal to mildly increased. Mild action tremor Strength is 5/5 in bilateral upper and lower limbs Sensation: Intact in bilateral upper and lower extremities Coordination: Intact finger-to- nose-finger bilaterally. RUE finger tapping and hand opening appears normal. Bradykinesia and decrement in LUE. Toe tapping slow bilaterally. Gait: Able to rise from chair with arms crossed unassisted. Narrow based gait. Mildly reduced arm swing. No freezing. Walks quickly. Normal turns.   Labs and Imaging review: New results: 12/19/23: tTG IgA ab < 1.0 Ferritin 86.6 B12: 800 CMP significant for glucose 107, Cr 1.56 CBC w/ diff unremarkable  06/06/23: B12: 320 Lyme negative EBV IgM negative, IgG elevated   Folate (06/27/23) wnl   09/30/23: Lipid panel: tChol 152, LDL 68, TG 210 TSH wnl HbA1c: 6.0 Hepatic function panel wnl CBC w/ diff unremarkable BMP significant for glucose 129, BUN 28, Cr 1.68 (all chronic)  Home sleep study (09/12/22):    MRI thoracic spine wo contrast (06/20/23): IMPRESSION: 1. No change in the appearance of the thoracic spine since the  prior exam. Mild thoracic spondylosis without central canal or foraminal narrowing. 2. Stable appearance of a T2 hyperintense lesion in the right T6 pedicle and a punctate T2 hyperintense lesion in T12. 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 No follow-up imaging is recommend.   Previously reviewed results: 02/06/23: BMP significant for glucose 118, Cr 1.49 CBC w/ diff unremarkable Hepatic function panel unremarkable   11/06/22: BMP unremarkable TSH wnl HbA1c: 5.9   Vit D (06/15/22): 48.9  04/24/22: MuSK abs negative AChR blocking abs negative, modulating abs negative   B1: 45 AChR binding abs: negative Striated muscle abs: negative   Normal or unremarkable: CK (325), Mg, TSH, free T4, CBC, Vit D Testosterone : low at 132 (264 is lower limit of normal) BMP with mildly elevated  glucose, elevated Cr (1.41) HbA1c: 6.0 B12: 409    MRI brain wo contrast (04/11/22): IMPRESSION: No acute intracranial process. No evidence of acute or subacute infarct.   MRI cervical spine wo contrast (04/11/22): IMPRESSION: 1. No acute abnormality of the cervical spine. 2. Mild bilateral neural foraminal stenosis at C3-4. 3. No spinal canal stenosis   MRI thoracic spine wo contrast (03/07/23): FINDINGS: Alignment: Mildly exaggerated thoracic kyphosis. Slight grade 1 anterolisthesis at T2-T3 and T3-T4.   Vertebrae: Multilevel Schmorl nodes. This includes Schmorl nodes within the T7 inferior endplate and T8 superior endplate which have associated marrow edema. Mild degenerative endplate edema elsewhere at T7-T8. 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 Indeterminate 12 mm T2 hyperintense and T1 hypointense lesion within the right T6 pedicle, pars and transverse process (for instance as seen on series 18, image 5) (series 20, image 18). Indeterminate 8 mm T2 hyperintense and T1 hypointense lesion within the right  aspect of the T12 vertebral body (for instance as seen on series 18, image 6). Multilevel ventrolateral osteophytes within the lower thoracic spine and at T12-L1.   Cord: No signal abnormality identified within the thoracic spinal cord.   Paraspinal and other soft tissues: No acute finding within included portions of the thorax or upper abdomen/retroperitoneum. No paraspinal mass or collection.   Disc levels:   Multilevel thoracic disc degeneration, greatest at T7-T8 (mild-to-moderate) and T12-L1 (moderate). Slight disc bulge at T10-T11 without significant spinal canal stenosis. No significant disc herniation or spinal canal stenosis elsewhere within the thoracic spine. Multilevel facet arthrosis. Facet spurring results in moderate left neural foraminal narrowing at T8-T9. No more than mild neural foraminal narrowing elsewhere within the thoracic spine.   Cervical spondylosis is incompletely assessed on localizer imaging.   Impression #1 will be called to the ordering clinician or representative by the Radiologist Assistant, and communication documented in the PACS or [employer].   IMPRESSION: 1. Indeterminate osseous lesions within the T6 and T12 vertebrae, as described. While these could reflect atypical hemangiomas, the imaging features are nonspecific and alternative etiologies (such as other primary bone lesion or osseous metastatic disease) cannot be excluded. A short-interval follow-up MRI in 2-3 months is recommended to ensure stability. 2. Thoracic spondylosis as outlined. Disc degeneration is greatest at T7-T8 (mild-to-moderate) and T12-L1 (moderate). There is marrow edema associated with small Schmorl nodes at the T7-T8 level, and these Schmorl nodes may be recent. Mild degenerative endplate edema elsewhere at the T7-T8 level. No significant spinal canal stenosis. Multilevel foraminal stenosis, greatest on the left at T8-T9 (moderate at this  site).  Assessment/Plan:  This is Marcus Crawford, a 73 y.o. male with chronic fatigue, tremor with movement, and generalized feeling of being unwell. Patient does have some parkinsonism features, perhaps more pronounced today than prior, including facial hypomimia, bradykinesia in LUE > RUE, and reduced arm swing during ambulation. He does not have clear freezing or rigidity or postural instability. I discussed these findings with the patient, expressing that the diagnosis was currently unclear, but that parkinsonism was possible. Options include monitoring closely, Datscan to obtain more information, or a trial of dopamine replacement therapy with Sinemet. Patient would like to trial Sinemet. We discussed common side effects.  Plan: Will start a trial of Carbidopa Levodopa (Sinemet) as follows: Take 1/2 tablet three times daily, at least 30 minutes before meals (approximately 7am/11am/4pm), for one week Then take 1/2 tablet in the morning, 1/2 tablet in the afternoon, 1 tablet in the evening, at  least 30 minutes before meals, for one week Then take 1/2 tablet in the morning, 1 tablet in the afternoon, 1 tablet in the evening, at least 30 minutes before meals, for one week Then take 1 tablet three times daily at 7am/11am/4pm, at least 30 minutes before meals  Return to clinic in about 2 months  Total time spent reviewing records, interview, history/exam, documentation, and coordination of care on day of encounter:  45 min  Venetia Potters, MD

## 2024-04-08 ENCOUNTER — Other Ambulatory Visit (HOSPITAL_BASED_OUTPATIENT_CLINIC_OR_DEPARTMENT_OTHER): Payer: Self-pay

## 2024-04-08 DIAGNOSIS — Z23 Encounter for immunization: Secondary | ICD-10-CM | POA: Diagnosis not present

## 2024-04-08 MED ORDER — FLUZONE HIGH-DOSE 0.5 ML IM SUSY
0.5000 mL | PREFILLED_SYRINGE | Freq: Once | INTRAMUSCULAR | 0 refills | Status: AC
  Filled 2024-04-08: qty 0.5, 1d supply, fill #0

## 2024-04-17 ENCOUNTER — Encounter: Payer: Self-pay | Admitting: Neurology

## 2024-04-17 ENCOUNTER — Ambulatory Visit (INDEPENDENT_AMBULATORY_CARE_PROVIDER_SITE_OTHER): Admitting: Neurology

## 2024-04-17 VITALS — BP 119/77 | HR 80 | Ht 72.0 in | Wt 195.0 lb

## 2024-04-17 DIAGNOSIS — G20C Parkinsonism, unspecified: Secondary | ICD-10-CM

## 2024-04-17 DIAGNOSIS — R5383 Other fatigue: Secondary | ICD-10-CM | POA: Diagnosis not present

## 2024-04-17 MED ORDER — CARBIDOPA-LEVODOPA 10-100 MG PO TABS: ORAL_TABLET | ORAL | 0 refills | Status: DC

## 2024-04-17 NOTE — Patient Instructions (Addendum)
 Start Carbidopa Levodopa as follows: Take 1/2 tablet three times daily, at least 30 minutes before meals (approximately 7am/11am/4pm), for one week Then take 1/2 tablet in the morning, 1/2 tablet in the afternoon, 1 tablet in the evening, at least 30 minutes before meals, for one week Then take 1/2 tablet in the morning, 1 tablet in the afternoon, 1 tablet in the evening, at least 30 minutes before meals, for one week Then take 1 tablet three times daily at 7am/11am/4pm, at least 30 minutes before meals   As a reminder, carbidopa/levodopa can be taken at the same time as a carbohydrate, but we like to have you take your pill either 30 minutes before a protein source or 1 hour after as protein can interfere with carbidopa/levodopa absorption.  Common side effects (eg, nausea, somnolence, headache) are less likely with low starting doses and slow titration and tend to resolve over time.  I will follow up with you in about 2 months.  Please let me know if you have any questions or concerns in the meantime.  The physicians and staff at [facility] are committed to providing excellent care. You may receive a survey requesting feedback about your experience at our office. We strive to receive very good responses to the survey questions. If you feel that your experience would prevent you from giving the office a very good  response, please contact our office to try to remedy the situation. We may be reached at [phone]. Thank you for taking the time out of your busy day to complete the survey.  Venetia Potters, MD [facility]

## 2024-04-20 ENCOUNTER — Other Ambulatory Visit: Payer: Self-pay

## 2024-04-20 ENCOUNTER — Telehealth: Payer: Self-pay | Admitting: Neurology

## 2024-04-20 DIAGNOSIS — G20C Parkinsonism, unspecified: Secondary | ICD-10-CM

## 2024-04-20 MED ORDER — CARBIDOPA-LEVODOPA 10-100 MG PO TABS: ORAL_TABLET | ORAL | 0 refills | Status: DC

## 2024-04-20 NOTE — Telephone Encounter (Signed)
 Left a message with the after hour service on 04-17-24 7:20 pm  Caller states needs clarification on prescription

## 2024-04-20 NOTE — Telephone Encounter (Signed)
 Left message on machine for patient to call back.

## 2024-04-20 NOTE — Telephone Encounter (Signed)
 Called pt. pharmacy called and the amount of pills was only for one week. Order was changed.

## 2024-04-23 NOTE — Telephone Encounter (Signed)
 Patient is calling to see if there is an interaction between Sinemet and Gabapentin . Patient was started on the Sinemet yesterday for possible parkinson's. Please review an advise, thank you

## 2024-04-23 NOTE — Telephone Encounter (Signed)
 Apparently no interaction but should consult with the doctor who prescribed Sinemet.

## 2024-04-27 DIAGNOSIS — H04123 Dry eye syndrome of bilateral lacrimal glands: Secondary | ICD-10-CM | POA: Diagnosis not present

## 2024-04-27 NOTE — Telephone Encounter (Signed)
 I called and spoke with patient and let him know what Dr. Curry said

## 2024-05-04 ENCOUNTER — Encounter: Payer: Self-pay | Admitting: Radiology

## 2024-05-04 DIAGNOSIS — N189 Chronic kidney disease, unspecified: Secondary | ICD-10-CM | POA: Diagnosis not present

## 2024-05-11 DIAGNOSIS — R7303 Prediabetes: Secondary | ICD-10-CM | POA: Diagnosis not present

## 2024-05-11 DIAGNOSIS — I129 Hypertensive chronic kidney disease with stage 1 through stage 4 chronic kidney disease, or unspecified chronic kidney disease: Secondary | ICD-10-CM | POA: Diagnosis not present

## 2024-05-11 DIAGNOSIS — Z8679 Personal history of other diseases of the circulatory system: Secondary | ICD-10-CM | POA: Diagnosis not present

## 2024-05-11 DIAGNOSIS — N390 Urinary tract infection, site not specified: Secondary | ICD-10-CM | POA: Diagnosis not present

## 2024-05-11 DIAGNOSIS — N183 Chronic kidney disease, stage 3 unspecified: Secondary | ICD-10-CM | POA: Diagnosis not present

## 2024-05-20 ENCOUNTER — Other Ambulatory Visit: Payer: Self-pay | Admitting: Neurology

## 2024-05-20 DIAGNOSIS — G20C Parkinsonism, unspecified: Secondary | ICD-10-CM

## 2024-05-22 ENCOUNTER — Ambulatory Visit

## 2024-05-22 ENCOUNTER — Other Ambulatory Visit: Payer: Self-pay | Admitting: Family Medicine

## 2024-05-22 DIAGNOSIS — I10 Essential (primary) hypertension: Secondary | ICD-10-CM

## 2024-05-26 ENCOUNTER — Telehealth: Payer: Self-pay | Admitting: Neurology

## 2024-05-26 NOTE — Telephone Encounter (Signed)
 Pt called an informed refill has been sent in

## 2024-05-26 NOTE — Telephone Encounter (Signed)
 Marcus Crawford called in stating that he is running out of Medication. [facility] informed [name] that they have contacted the office cardidopa-levodopa . He stated that he will be heading out of town probably tomorrow afternoon.   PH: [phone] (home) Cell: [phone]

## 2024-06-01 DIAGNOSIS — H04123 Dry eye syndrome of bilateral lacrimal glands: Secondary | ICD-10-CM | POA: Diagnosis not present

## 2024-06-02 NOTE — Progress Notes (Unsigned)
 NEUROLOGY FOLLOW UP OFFICE NOTE  Marcus Crawford [id number]  Subjective:  Marcus Crawford is a 73 y.o. year old male with a history of HTN, HLD, CKD, pre-diabetes, vit D deficiency, prostate cancer, bursitis of right hip, RBBB, Bell's Palsy c/b chronic right face droop, hypogonadism, and fatigue who we last saw on 04/17/24 for tremor, bradykinesia, and fatigue.  To briefly review: Initial consultation (04/16/22): Patient has been having progressive weakness since May or June of 2023. He first noticed that when he did anything, he would get tired very quickly. He denies weakness at first, but just wouldn't have energy to go very long. He would have to rest to get his energy up again. He may have started noticing his stride while walking was changing for the last year. He is not sure how he was walking different, but noticed he was.   Patient feels weak in his arms and legs. He mentioned that about August 2023 he noticed leg weakness and September 2023 he started noticing his arms felt funny and weak. He does not notice fluctuating of his weakness. He feels his strength is so poor and his fatigue is so much that he ends up laying around than being upright. He has lost 15-20 pounds over this time period as well. He says he has no appetite. He does not feel he can think straight. He feels like he has brain fog. He also feels like his vision is affected. He has dry eyes and feels like his vision is poor. He denies double vision. He also feels like he cannot sit still. He is restless. He mentions that his hand writing is poorer than previous and perhaps smaller.   He is sleeping very poorly. He takes ambien  to help him sleep but he is still only sleeping from 9:30pm to 3:30am (~6 hours per night). He feels rested but only for a short period of time. This has been going on for about 2 months. He thinks he snores a lot. He had a sleep study scheduled a couple of weeks ago, but this was not completed because  per patient, he wasn't in good enough health, so it was cancelled.    He endorses poor mood as well. His is upset about not only his condition but also his wife. She is going through cancer treatments.   Patient states he feels so bad that he feels like he is dying.   Current MG like symptoms: Ptosis: He thinks he occasionally has droopy eyelids, but maybe this has been going on for a long time. Double vision: Not clear Speech: Patient feels like his voice is changing. He feels hoarse. This comes and goes. He thinks it is worse when he gets upset or if he is tired or using his voice a lot. Chewing: None Swallowing: None Breathing: None Arm strength: Feels weak. Harder to brush teeth. Not dropping things.  Leg strength: Feels weak. Feels like he is taking shorter chopper steps per patient.    He currently takes vitamin D  1000 IU per day for several years. He takes no other vitamins.   The patient has not had similar episodes of symptoms in the past.    He endorses some tingling on the bottom of his feet occasionally and has occasional cramping.    Any change in urine color, especially after exertion/physical activity? No   Pseudobulbar affect is absent, but patient does endorse being more emotional.   The patient has not  noticed any recent  skin rashes nor does he report any constitutional symptoms like fever, night sweats. He endorses anorexia and weight loss as above.   EtOH use: None  Restrictive diet? No Family history of neuropathy/myopathy/NM disease? None   Patient presented to the ED on 04/10/22 for weakness. Per documentation, symptoms have been present for the last year, but progressed over the last month. He has lost 15 pounds over the last few months due to poor appetite. He feels very fatigued. There was concern for myasthenia gravis, so neurology was consulted. Neurology was not concerned for MG crisis, so sent the antibodies and recommended outpatient follow up.    04/24/22: AchR binding abs and striated muscle abs were negative. B1 was elevated to 45.   Patient continues to feel very fatigued and weak. We spoke by phone on 04/23/22, and patient was very concerned about his symptoms, so this follow up appointment was made.   Patient today says he does not feel depressed. He was prescribed a medication for depression but did not take it.   He also has poor sleep. He was given ambien  which he took last night and slept 7 hours. He is worried about getting addicted to the medication though.   11/14/22: Labs returned normal. Patient cancelled EMG.    He did see sleep medicine. He is still awaiting CPAP.   He saw his PCP on 11/06/22 with similar complaints of fatigue, daytime sleepiness, apathy, anhedonia, and feeling shaky. Course tremors were noted, so patient was referred back to me today.    Patient mentions that he has noticed this more with stress. He notices it occurs with action. When he is putting down his glasses or eating cereal. When he thinks about it, he will notice it more (like thinking about it makes it worse). He was recently restarted on methylphenidate  and thinks this has helped how he is feeling and tremors.   He denies freezing. He denies rest tremor. He denies significant imbalance or falls. He endorses poor smell for 20-30 years. He may have had some kicking in his sleep in the past, but nothing currently that sounds like REM sleep behavior.   He has no family history of tremor.   Relevant current medications: -Cymbalta  60 mg daily (tremor in 2-3%) -Methylphenidate  18 mg daily (jitteriness in 4%, tremor in 3%) -Seroquel  25 mg daily (parkinsonism in < 6%, tremor in 2%) -Trazodone  50 mg daily (nervousness in 15%, tremor in 3-5%)   06/06/23: Patient saw psych who did not think patient had depressive symptoms but wondered about a viral infection per patient.    He continues to feel very low energy. He feels a pressure in his chest and  arms as well. He has brain fog. Symptoms started around 11/2021. He does not remember any infection or insect bites.   Patient continues to have occasional tremor, but only when stressed. His wife is going through bone cancer treatment, so he has more responsibilities. The tremor does not affect eating or drinking too much.  04/17/24: B12 was borderline low so I recommended B12 1000 mcg daily. Lyme was negative. EBV testing showed remote infection, not active. MRI thoracic spine was unremarkable.   Patient saw integrative medicine at [facility] who diagnosed patient with chronic fatigue syndrome. He is now on a long list of supplements that he thinks has helped some. He feels he was feeling better until 10/2023 and then felt worse. This was around the time he stopped Cymbalta . He is now taking 30 mg  daily. He is still taking seroquel  25 mg daily.   He still has some tremors when using his hands like eating or drinking. He denies rest tremor. He will sometimes spill things.   When asked about freezing when trying to move today, he does endorse sometimes feeling like it is difficult to move when in shower. He denies any freezing when trying to walk. He denies imbalance or falls.  Most recent Assessment and Plan (04/17/24): This is Marcus Crawford, a 73 y.o. male with chronic fatigue, tremor with movement, and generalized feeling of being unwell. Patient does have some parkinsonism features, perhaps more pronounced today than prior, including facial hypomimia, bradykinesia in LUE > RUE, and reduced arm swing during ambulation. He does not have clear freezing or rigidity or postural instability. I discussed these findings with the patient, expressing that the diagnosis was currently unclear, but that parkinsonism was possible. Options include monitoring closely, [employer] to obtain more information, or a trial of dopamine replacement therapy with Sinemet . Patient would like to trial Sinemet . We discussed common side  effects.   Plan: Will start a trial of Carbidopa  Levodopa  (Sinemet ) as follows: Take 1/2 tablet three times daily, at least 30 minutes before meals (approximately 7am/11am/4pm), for one week Then take 1/2 tablet in the morning, 1/2 tablet in the afternoon, 1 tablet in the evening, at least 30 minutes before meals, for one week Then take 1/2 tablet in the morning, 1 tablet in the afternoon, 1 tablet in the evening, at least 30 minutes before meals, for one week Then take 1 tablet three times daily at 7am/11am/4pm, at least 30 minutes before meals  Since their last visit: Patient noticed he was not shaking as much with Sinemet . He thinks he was moving better on the medication as well. He continues to take Sinemet  1 tablet TID (7am, 11 am, and 4 pm). He denies any on/off symptoms or side effects.  He is using CPAP for OSA. He continues to take B12 supplementation.  He continues to take the supplements given by [facility] for chronic fatigue.  MEDICATIONS:  Outpatient Encounter Medications as of 06/03/2024  Medication Sig   Acetylcarnitine HCl, Nutrient, (ACETYL L-CARNITINE HCL) 500 MG CAPS Take 500 mg by mouth once.   carbidopa -levodopa  (SINEMET  IR) 10-100 MG tablet SEE ATTACHMENT FOR DIRECTIONS   cholecalciferol (VITAMIN D ) 1000 UNITS tablet Take 2,000 Units by mouth daily. Taking 2000 units daily   co-enzyme Q-10 30 MG capsule Take 100 mg by mouth 2 (two) times daily. (Patient taking differently: Take 200 mg by mouth 1 day or 1 dose.)   cyanocobalamin  (VITAMIN B12) 1000 MCG tablet Take 1,000 mcg by mouth daily.   cycloSPORINE (RESTASIS) 0.05 % ophthalmic emulsion 1 drop 2 (two) times daily.   D-RIBOSE PO Take 5 mg by mouth in the morning and at bedtime.   docusate sodium  (COLACE) 100 MG capsule Take 1 capsule (100 mg total) by mouth every 12 (twelve) hours.   DULoxetine  (CYMBALTA ) 30 MG capsule Take 1 capsule (30 mg total) by mouth daily.   gabapentin  (NEURONTIN ) 100 MG capsule Take 1 capsule  (100 mg total) by mouth 2 (two) times daily.   levocetirizine (XYZAL ) 5 MG tablet TAKE ONE TABLET BY MOUTH EVERY EVENING   magnesium  citrate solution Take 296 mLs by mouth once.   NADH POWD 10 mg by Does not apply route once.   olmesartan  (BENICAR ) 20 MG tablet TAKE 1 TABLET(20 MG) BY MOUTH DAILY   polyethylene glycol powder (MIRALAX )  17 GM/SCOOP powder Take 17 g by mouth daily as needed for severe constipation or moderate constipation.   Polyvinyl Alcohol-Povidone (REFRESH OP) Place 1 drop into both eyes 4 (four) times daily as needed (dryness).   pravastatin  (PRAVACHOL ) 10 MG tablet Take 1 tablet (10 mg total) by mouth daily.   QUEtiapine  (SEROQUEL ) 25 MG tablet Take 1 tablet (25 mg total) by mouth at bedtime.   RABEprazole  (ACIPHEX ) 20 MG tablet TAKE ONE TABLET BY MOUTH ONE TIME DAILY   traZODone  (DESYREL ) 50 MG tablet Take 1 tablet (50 mg total) by mouth at bedtime.   Turmeric (CURCUMIN 95) 500 MG CAPS Take 1,000 mg by mouth once.   No facility-administered encounter medications on file as of 06/03/2024.    PAST MEDICAL HISTORY: Past Medical History:  Diagnosis Date   Anemia    early 20's   Bell's palsy 1977   Blood clot in vein 2005   left leg blood clot-treated as OP; no residual   Blood transfusion without reported diagnosis    with appendectomy   Borderline diabetic    no meds   Cancer (HCC) 08/2013   prostate/ had surgery/ no chemo or radiation   Cataract    forming   CKD (chronic kidney disease) stage 3, GFR 30-59 ml/min (HCC) saw dr douglass 6 months ago   on rampiril for kidney function also   Colonic polyp    X3 ,hyperplastic   Diverticulosis of colon    GERD (gastroesophageal reflux disease)    H/O hiatal hernia    Hemorrhoid    Hyperlipidemia    Hypertension    past hx - no meds since 2013 with  adrenal gland surgery   Kidney stone on left side 2013   asymptomatic , incidental finding   Migraine last 6-7 yrs ago   PMH of ; resolved post adrenal adenoma  resection   Nephrolithiasis 2013   kidney stone  as incidental finding on imaging   PONV (postoperative nausea and vomiting)     PAST SURGICAL HISTORY: Past Surgical History:  Procedure Laterality Date   adrenal venous sampling  03-2012   ADRENALECTOMY  04/14/2012   Procedure: ADRENALECTOMY;  Surgeon: Lynda Leos, MD;  Location: [facility];  Service: General;  Laterality: Left;  Left Adrenal Excision   APPENDECTOMY  1985   COLONOSCOPY     last 9/12, Dr Aneita; due 2017   ESOPHAGEAL DILATION  2001   Dr Cloretta   POLYPECTOMY      X 3   ROBOT ASSISTED LAPAROSCOPIC RADICAL PROSTATECTOMY N/A 09/07/2013   Procedure: ROBOTIC ASSISTED LAPAROSCOPIC RADICAL PROSTATECTOMY LEVEL 1;  Surgeon: Noretta Ferrara, MD;  Location: [facility];  Service: Urology;  Laterality: N/A;    ALLERGIES: Allergies  Allergen Reactions   Penicillins Rash    Rash (RN clarified with pt) No SOB/swelling   Sulfa Antibiotics Rash    FAMILY HISTORY: Family History  Problem Relation Age of Onset   Rectal cancer Mother    Colon cancer Mother 58   Heart attack Mother 39   Colon polyps Sister    Lung cancer Sister        smoker   Diabetes Sister    Breast cancer Sister    Alzheimer's disease Sister    Heart failure Brother    Kidney cancer Brother    Prostate cancer Brother 33   Heart disease Brother    Hypertension Brother    Kidney cancer Brother    Diabetes Brother  Heart attack Brother 28   Esophageal cancer Maternal Uncle    Kidney cancer Maternal Uncle    Stomach cancer Neg Hx     SOCIAL HISTORY: Social History   Tobacco Use   Smoking status: Never   Smokeless tobacco: Never  Vaping Use   Vaping status: Never Used  Substance Use Topics   Alcohol use: Not Currently   Drug use: No   Social History   Social History Narrative   Right handed        Are you currently employed ?    What is your current occupation?retired   Do you live at home alone?   Who lives with you?    What type of home do  you live in: 1 story or 2 story?    Caffiene  1 daily      Objective:  Vital Signs:  BP 92/62   Pulse 65   Ht 6' (1.829 m)   Wt 197 lb (89.4 kg)   SpO2 99%   BMI 26.72 kg/m   General: No acute distress.  Patient appears well-groomed.   Head:  Normocephalic/atraumatic Neck: supple Heart: regular rate and rhythm Lungs: Clear to auscultation bilaterally. Psych: Flat affect  Neurological Exam: Mental status: alert and oriented, speech fluent and not dysarthric, language intact.  Cranial nerves: CN I: not tested CN II: pupils equal, round and reactive to light, visual fields intact CN III, IV, VI:  full range of motion, no nystagmus, no ptosis CN V: facial sensation intact. CN VII: upper and lower face symmetric CN VIII: hearing intact CN IX, X: uvula midline CN XI: sternocleidomastoid and trapezius muscles intact CN XII: tongue midline  Bulk & Tone: normal. Mild tremor, more kinetic (L > R hand) Motor:  muscle strength 5/5 throughout Deep Tendon Reflexes:  2+ throughout.   Sensation:  Pinprick, vibratory, and proprioceptive sensation intact. Finger to nose testing:  Without dysmetria.   Coordination: RUE finger tapping and hand opening with mild decrement. More noticeable bradykinesia and decrement in LUE. Toe tapping slow bilaterally.  Gait:  Narrow based gait. Reduced arm swing bilaterally. No freezing. Normal turns.   Labs and Imaging review: No new results  Previously reviewed results: 12/19/23: tTG IgA ab < 1.0 Ferritin 86.6 B12: 800 CMP significant for glucose 107, Cr 1.56 CBC w/ diff unremarkable   06/06/23: B12: 320 Lyme negative EBV IgM negative, IgG elevated   Folate (06/27/23) wnl   09/30/23: Lipid panel: tChol 152, LDL 68, TG 210 TSH wnl HbA1c: 6.0 Hepatic function panel wnl CBC w/ diff unremarkable BMP significant for glucose 129, BUN 28, Cr 1.68 (all chronic)  02/06/23: BMP significant for glucose 118, Cr 1.49 CBC w/ diff  unremarkable Hepatic function panel unremarkable   11/06/22: BMP unremarkable TSH wnl HbA1c: 5.9   Vit D (06/15/22): 48.9  04/24/22: MuSK abs negative AChR blocking abs negative, modulating abs negative   B1: 45 AChR binding abs: negative Striated muscle abs: negative   Normal or unremarkable: CK (325), Mg, TSH, free T4, CBC, Vit D Testosterone : low at 132 (264 is lower limit of normal) BMP with mildly elevated glucose, elevated Cr (1.41) HbA1c: 6.0 B12: 409    MRI brain wo contrast (04/11/22): IMPRESSION: No acute intracranial process. No evidence of acute or subacute infarct.   MRI cervical spine wo contrast (04/11/22): IMPRESSION: 1. No acute abnormality of the cervical spine. 2. Mild bilateral neural foraminal stenosis at C3-4. 3. No spinal canal stenosis   MRI thoracic spine  wo contrast (03/07/23): FINDINGS: Alignment: Mildly exaggerated thoracic kyphosis. Slight grade 1 anterolisthesis at T2-T3 and T3-T4.   Vertebrae: Multilevel Schmorl nodes. This includes Schmorl nodes within the T7 inferior endplate and T8 superior endplate which have associated marrow edema. Mild degenerative endplate edema elsewhere at T7-T8. 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 Indeterminate 12 mm T2 hyperintense and T1 hypointense lesion within the right T6 pedicle, pars and transverse process (for instance as seen on series 18, image 5) (series 20, image 18). Indeterminate 8 mm T2 hyperintense and T1 hypointense lesion within the right aspect of the T12 vertebral body (for instance as seen on series 18, image 6). Multilevel ventrolateral osteophytes within the lower thoracic spine and at T12-L1.   Cord: No signal abnormality identified within the thoracic spinal cord.   Paraspinal and other soft tissues: No acute finding within included portions of the thorax or upper abdomen/retroperitoneum. No paraspinal mass or collection.   Disc levels:   Multilevel thoracic  disc degeneration, greatest at T7-T8 (mild-to-moderate) and T12-L1 (moderate). Slight disc bulge at T10-T11 without significant spinal canal stenosis. No significant disc herniation or spinal canal stenosis elsewhere within the thoracic spine. Multilevel facet arthrosis. Facet spurring results in moderate left neural foraminal narrowing at T8-T9. No more than mild neural foraminal narrowing elsewhere within the thoracic spine.   Cervical spondylosis is incompletely assessed on localizer imaging.   Impression #1 will be called to the ordering clinician or representative by the Radiologist Assistant, and communication documented in the PACS or [employer].   IMPRESSION: 1. Indeterminate osseous lesions within the T6 and T12 vertebrae, as described. While these could reflect atypical hemangiomas, the imaging features are nonspecific and alternative etiologies (such as other primary bone lesion or osseous metastatic disease) cannot be excluded. A short-interval follow-up MRI in 2-3 months is recommended to ensure stability. 2. Thoracic spondylosis as outlined. Disc degeneration is greatest at T7-T8 (mild-to-moderate) and T12-L1 (moderate). There is marrow edema associated with small Schmorl nodes at the T7-T8 level, and these Schmorl nodes may be recent. Mild degenerative endplate edema elsewhere at the T7-T8 level. No significant spinal canal stenosis. Multilevel foraminal stenosis, greatest on the left at T8-T9 (moderate at this site).  Home sleep study (09/12/22):    MRI thoracic spine wo contrast (06/20/23): IMPRESSION: 1. No change in the appearance of the thoracic spine since the prior exam. Mild thoracic spondylosis without central canal or foraminal narrowing. 2. Stable appearance of a T2 hyperintense lesion in the right T6 pedicle and a punctate T2 hyperintense lesion in T12. 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 No follow-up imaging is recommend.  Assessment/Plan:  This is [employer], a 73 y.o. male with chronic fatigue, tremor, and bradykinesia. His history and examination became concerning for possible parkinsonism on 04/17/24, so we discussed that work up. Patient opted for a trial of Sinemet , which he states has helped his tremor and he feels he is moving better. Is exam is similar to prior. 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   Plan: -Continue Sinemet  1 tablet three times daily (7am, 11am, 4pm) -Continue B12 1000 mcg daily -Fall precautions discussed -Movement disorder program packet given today  Return to clinic in 6 months  Total time spent reviewing records, interview, history/exam, documentation, and coordination of care on day of encounter:  30 min  Venetia Potters, MD

## 2024-06-03 ENCOUNTER — Ambulatory Visit: Admitting: Neurology

## 2024-06-03 ENCOUNTER — Encounter: Payer: Self-pay | Admitting: Neurology

## 2024-06-03 VITALS — BP 92/62 | HR 65 | Ht 72.0 in | Wt 197.0 lb

## 2024-06-03 DIAGNOSIS — G20A1 Parkinson's disease without dyskinesia, without mention of fluctuations: Secondary | ICD-10-CM | POA: Diagnosis not present

## 2024-06-03 DIAGNOSIS — G4733 Obstructive sleep apnea (adult) (pediatric): Secondary | ICD-10-CM

## 2024-06-03 DIAGNOSIS — R5383 Other fatigue: Secondary | ICD-10-CM

## 2024-06-03 NOTE — Patient Instructions (Addendum)
 You appear to be responding to Sinemet , a medication for Parkinson's disease. This is our presumed diagnosis at this point.   Continue Sinemet  1 tablet three times daily (7am, 11am, 4pm) at least 30 minutes prior to meals.  I will see you again in 6 months. Please let me know if you have any questions or concerns in the meantime.  The physicians and staff at [facility] are committed to providing excellent care. You may receive a survey requesting feedback about your experience at our office. We strive to receive very good responses to the survey questions. If you feel that your experience would prevent you from giving the office a very good  response, please contact our office to try to remedy the situation. We may be reached at [phone]. Thank you for taking the time out of your busy day to complete the survey.  Venetia Potters, MD [facility]   Preventing Falls at [facility] are common, often dreaded events in the lives of older people. Aside from the obvious injuries and even death that may result, fall can cause wide-ranging consequences including loss of independence, mental decline, decreased activity and mobility. Younger people are also at risk of falling, especially those with chronic illnesses and fatigue.  Ways to reduce risk for falling Examine diet and medications. Warm foods and alcohol dilate blood vessels, which can lead to dizziness when standing. Sleep aids, antidepressants and pain medications can also increase the likelihood of a fall.  Get a vision exam. Poor vision, cataracts and glaucoma increase the chances of falling.  Check foot gear. Shoes should fit snugly and have a sturdy, nonskid sole and a broad, low heel  Participate in a physician-approved exercise program to build and maintain muscle strength and improve balance and coordination. Programs that use ankle weights or stretch bands are excellent for muscle-strengthening. Water  aerobics programs  and low-impact Tai Chi programs have also been shown to improve balance and coordination.  Increase vitamin D  intake. Vitamin D  improves muscle strength and increases the amount of calcium  the body is able to absorb and deposit in bones.  How to prevent falls from common hazards Floors - Remove all loose wires, cords, and throw rugs. Minimize clutter. Make sure rugs are anchored and smooth. Keep furniture in its usual place.  Chairs -- Use chairs with straight backs, armrests and firm seats. Add firm cushions to existing pieces to add height.  Bathroom - Install grab bars and non-skid tape in the tub or shower. Use a bathtub transfer bench or a shower chair with a back support Use an elevated toilet seat and/or safety rails to assist standing from a low surface. Do not use towel racks or bathroom tissue holders to help you stand.  Lighting - Make sure halls, stairways, and entrances are well-lit. Install a night light in your bathroom or hallway. Make sure there is a light switch at the top and bottom of the staircase. Turn lights on if you get up in the middle of the night. Make sure lamps or light switches are within reach of the bed if you have to get up during the night.  Kitchen - Install non-skid rubber mats near the sink and stove. Clean spills immediately. Store frequently used utensils, pots, pans between waist and eye level. This helps prevent reaching and bending. Sit when getting things out of lower cupboards.  Living room/ Bedrooms - Place furniture with wide spaces in between, giving enough room to move around. Establish a  route through the living room that gives you something to hold onto as you walk.  Stairs - Make sure treads, rails, and rugs are secure. Install a rail on both sides of the stairs. If stairs are a threat, it might be helpful to arrange most of your activities on the lower level to reduce the number of times you must climb the stairs.  Entrances and doorways  - Install metal handles on the walls adjacent to the doorknobs of all doors to make it more secure as you travel through the doorway.  Tips for maintaining balance Keep at least one hand free at all times. Try using a backpack or fanny pack to hold things rather than carrying them in your hands. Never carry objects in both hands when walking as this interferes with keeping your balance.  Attempt to swing both arms from front to back while walking. This might require a conscious effort if Parkinson's disease has diminished your movement. It will, however, help you to maintain balance and posture, and reduce fatigue.  Consciously lift your feet off of the ground when walking. Shuffling and dragging of the feet is a common culprit in losing your balance.  When trying to navigate turns, use a U technique of facing forward and making a wide turn, rather than pivoting sharply.  Try to stand with your feet shoulder-length apart. When your feet are close together for any length of time, you increase your risk of losing your balance and falling.  Do one thing at a time. Don't try to walk and accomplish another task, such as reading or looking around. The decrease in your automatic reflexes complicates motor function, so the less distraction, the better.  Do not wear rubber or gripping soled shoes, they might catch on the floor and cause tripping.  Move slowly when changing positions. Use deliberate, concentrated movements and, if needed, use a grab bar or walking aid. Count 15 seconds between each movement. For example, when rising from a seated position, wait 15 seconds after standing to begin walking.  If balance is a continuous problem, you might want to consider a walking aid such as a cane, walking stick, or walker. Once you've mastered walking with help, you might be ready to try it on your own again.

## 2024-06-05 ENCOUNTER — Other Ambulatory Visit (HOSPITAL_COMMUNITY): Payer: Self-pay | Admitting: Psychiatry

## 2024-06-05 DIAGNOSIS — F419 Anxiety disorder, unspecified: Secondary | ICD-10-CM

## 2024-06-09 ENCOUNTER — Other Ambulatory Visit (HOSPITAL_COMMUNITY): Payer: Self-pay | Admitting: *Deleted

## 2024-06-09 DIAGNOSIS — F419 Anxiety disorder, unspecified: Secondary | ICD-10-CM

## 2024-06-09 MED ORDER — GABAPENTIN 100 MG PO CAPS: 100.0000 mg | ORAL_CAPSULE | Freq: Two times a day (BID) | ORAL | 0 refills | Status: DC

## 2024-06-11 ENCOUNTER — Ambulatory Visit (HOSPITAL_COMMUNITY): Admitting: Psychiatry

## 2024-06-14 ENCOUNTER — Other Ambulatory Visit (HOSPITAL_COMMUNITY): Payer: Self-pay | Admitting: Psychiatry

## 2024-06-14 DIAGNOSIS — F419 Anxiety disorder, unspecified: Secondary | ICD-10-CM

## 2024-06-14 DIAGNOSIS — G9332 Myalgic encephalomyelitis/chronic fatigue syndrome: Secondary | ICD-10-CM

## 2024-06-14 DIAGNOSIS — G4733 Obstructive sleep apnea (adult) (pediatric): Secondary | ICD-10-CM

## 2024-06-29 ENCOUNTER — Other Ambulatory Visit: Payer: Self-pay

## 2024-06-29 DIAGNOSIS — G20C Parkinsonism, unspecified: Secondary | ICD-10-CM

## 2024-06-29 MED ORDER — CARBIDOPA-LEVODOPA 10-100 MG PO TABS: ORAL_TABLET | ORAL | 2 refills | Status: AC

## 2024-07-09 ENCOUNTER — Ambulatory Visit (HOSPITAL_BASED_OUTPATIENT_CLINIC_OR_DEPARTMENT_OTHER): Admitting: Psychiatry

## 2024-07-09 ENCOUNTER — Encounter (HOSPITAL_COMMUNITY): Payer: Self-pay | Admitting: Psychiatry

## 2024-07-09 ENCOUNTER — Other Ambulatory Visit (HOSPITAL_COMMUNITY): Payer: Self-pay | Admitting: Psychiatry

## 2024-07-09 VITALS — BP 122/79 | HR 73 | Ht 72.0 in | Wt 199.0 lb

## 2024-07-09 DIAGNOSIS — G4733 Obstructive sleep apnea (adult) (pediatric): Secondary | ICD-10-CM | POA: Diagnosis not present

## 2024-07-09 DIAGNOSIS — F419 Anxiety disorder, unspecified: Secondary | ICD-10-CM | POA: Diagnosis not present

## 2024-07-09 DIAGNOSIS — G9332 Myalgic encephalomyelitis/chronic fatigue syndrome: Secondary | ICD-10-CM | POA: Diagnosis not present

## 2024-07-09 MED ORDER — DULOXETINE HCL 30 MG PO CPEP: 30.0000 mg | ORAL_CAPSULE | Freq: Every day | ORAL | 0 refills | Status: AC

## 2024-07-09 MED ORDER — GABAPENTIN 100 MG PO CAPS: 100.0000 mg | ORAL_CAPSULE | Freq: Two times a day (BID) | ORAL | 2 refills | Status: AC

## 2024-07-09 NOTE — Progress Notes (Signed)
 [facility] MD/PA/NP OP Progress Note  Patient location; office Provider location; office  07/09/2024 3:16 PM Marcus Crawford  MRN:  [id number]  Chief Complaint:  Chief Complaint  Patient presents with   Follow-up   HPI: Patient came to the office for his appointment.  He has been doing better.  Recently saw neurologist started him on carbidopa /levodopa  and he noticed energy is better.  He is taking Cymbalta  and gabapentin  which is helping his anxiety.  He started going outside more frequently and tried to be active.  He is wondering if he can stop the Cymbalta  however given the history of anxiety I will recommend not to discontinue.  He is taking trazodone  and Seroquel  from other provider.  Discussed tried to stop the quetiapine  since underlying possibility of Parkinson which may contribute more symptoms of Parkinson.  His appetite is okay.  He had a good Christmas.  He went to [place]  to visit his sister.  He is seeing Dr. Leigh for his neurologist symptoms.  His tremors are not as bad.  He uses CPAP which is helping his apnea.  He denies drinking or using any illegal substances.     ICD-10-CM   1. Anxiety  F41.9 DULoxetine  (CYMBALTA ) 30 MG capsule    gabapentin  (NEURONTIN ) 100 MG capsule    2. Chronic fatigue disorder  G93.32 DULoxetine  (CYMBALTA ) 30 MG capsule    3. OSA (obstructive sleep apnea)  G47.33 DULoxetine  (CYMBALTA ) 30 MG capsule        Past Psychiatric History: No history of suicidal attempt, mania, psychosis, delusion, PTSD or inpatient services.  Started taking antidepressant last year from primary care.  He was given Seroquel , Cymbalta  and Concerta .  Unexplained fatigue after the episode while doing the lawn more.  Had a few ER visit for extreme fatigue.  Seroquel  was discontinued by this clinical research associate.  Past Medical History:  Past Medical History:  Diagnosis Date   Anemia    early 20's   Bell's palsy 1977   Blood clot in vein 2005   left leg blood clot-treated as OP; no  residual   Blood transfusion without reported diagnosis    with appendectomy   Borderline diabetic    no meds   Cancer (HCC) 08/2013   prostate/ had surgery/ no chemo or radiation   Cataract    forming   CKD (chronic kidney disease) stage 3, GFR 30-59 ml/min (HCC) saw dr douglass 6 months ago   on rampiril for kidney function also   Colonic polyp    X3 ,hyperplastic   Diverticulosis of colon    GERD (gastroesophageal reflux disease)    H/O hiatal hernia    Hemorrhoid    Hyperlipidemia    Hypertension    past hx - no meds since 2013 with  adrenal gland surgery   Kidney stone on left side 2013   asymptomatic , incidental finding   Migraine last 6-7 yrs ago   PMH of ; resolved post adrenal adenoma resection   Nephrolithiasis 2013   kidney stone  as incidental finding on imaging   PONV (postoperative nausea and vomiting)     Past Surgical History:  Procedure Laterality Date   adrenal venous sampling  03-2012   ADRENALECTOMY  04/14/2012   Procedure: ADRENALECTOMY;  Surgeon: Lynda Leos, MD;  Location: [facility];  Service: General;  Laterality: Left;  Left Adrenal Excision   APPENDECTOMY  1985   COLONOSCOPY     last 9/12, Dr Aneita; due 2017   ESOPHAGEAL DILATION  2001   Dr Cloretta   POLYPECTOMY      X 3   ROBOT ASSISTED LAPAROSCOPIC RADICAL PROSTATECTOMY N/A 09/07/2013   Procedure: ROBOTIC ASSISTED LAPAROSCOPIC RADICAL PROSTATECTOMY LEVEL 1;  Surgeon: Noretta Ferrara, MD;  Location: [facility];  Service: Urology;  Laterality: N/A;    Family Psychiatric History: Reviewed  Family History:  Family History  Problem Relation Age of Onset   Rectal cancer Mother    Colon cancer Mother 58   Heart attack Mother 38   Colon polyps Sister    Lung cancer Sister        smoker   Diabetes Sister    Breast cancer Sister    Alzheimer's disease Sister    Heart failure Brother    Kidney cancer Brother    Prostate cancer Brother 52   Heart disease Brother    Hypertension Brother    Kidney cancer  Brother    Diabetes Brother    Heart attack Brother 20   Esophageal cancer Maternal Uncle    Kidney cancer Maternal Uncle    Stomach cancer Neg Hx     Social History:  Social History   Socioeconomic History   Marital status: Married    Spouse name: Adrien   Number of children: 0   Years of education: Not on file   Highest education level: Some college, no degree  Occupational History   Occupation: retired  Tobacco Use   Smoking status: Never   Smokeless tobacco: Never  Vaping Use   Vaping status: Never Used  Substance and Sexual Activity   Alcohol use: Not Currently   Drug use: No   Sexual activity: Yes  Other Topics Concern   Not on file  Social History Narrative   Right handed        Are you currently employed ?    What is your current occupation?retired   Do you live at home alone?   Who lives with you?    What type of home do you live in: 1 story or 2 story?    Caffiene  1 daily   Social Drivers of Health   Tobacco Use: Low Risk (06/03/2024)   Patient History    Smoking Tobacco Use: Never    Smokeless Tobacco Use: Never    Passive Exposure: Not on file  Financial Resource Strain: Low Risk (02/03/2024)   Overall Financial Resource Strain (CARDIA)    Difficulty of Paying Living Expenses: Not hard at all  Food Insecurity: No Food Insecurity (02/03/2024)   Epic    Worried About Radiation Protection Practitioner of Food in the Last Year: Never true    Ran Out of Food in the Last Year: Never true  Transportation Needs: Unknown (02/03/2024)   Epic    Lack of Transportation (Medical): Patient declined    Lack of Transportation (Non-Medical): No  Physical Activity: Inactive (02/03/2024)   Exercise Vital Sign    Days of Exercise per Week: 0 days    Minutes of Exercise per Session: Not on file  Stress: Stress Concern Present (05/11/2023)   [employer] of Occupational Health - Occupational Stress Questionnaire    Feeling of Stress : To some extent  Social Connections: Moderately  Integrated (02/03/2024)   Social Connection and Isolation Panel    Frequency of Communication with Friends and Family: Three times a week    Frequency of Social Gatherings with Friends and Family: Once a week    Attends Religious Services: Never    Active Member  of Clubs or Organizations: Yes    Attends Banker Meetings: Never    Marital Status: Married  Depression (PHQ2-9): Low Risk (02/06/2024)   Depression (PHQ2-9)    PHQ-2 Score: 3  Alcohol Screen: Low Risk (01/03/2023)   Alcohol Screen    Last Alcohol Screening Score (AUDIT): 0  Housing: Unknown (02/03/2024)   Epic    Unable to Pay for Housing in the Last Year: No    Number of Times Moved in the Last Year: Not on file    Homeless in the Last Year: No  Utilities: Not At Risk (01/03/2023)   AHC Utilities    Threatened with loss of utilities: No  Health Literacy: Not on file    Allergies:  Allergies  Allergen Reactions   Penicillins Rash    Rash (RN clarified with pt) No SOB/swelling   Sulfa Antibiotics Rash    Metabolic Disorder Labs: Lab Results  Component Value Date   HGBA1C 6.0 09/30/2023   MPG 117 01/26/2020   Lab Results  Component Value Date   PROLACTIN 5.6 02/20/2022   Lab Results  Component Value Date   CHOL 152 09/30/2023   TRIG 210.0 (H) 09/30/2023   HDL 42.00 09/30/2023   CHOLHDL 4 09/30/2023   VLDL 42.0 (H) 09/30/2023   LDLCALC 68 09/30/2023   LDLCALC 115 (H) 01/26/2020   Lab Results  Component Value Date   TSH 1.90 09/30/2023   TSH 2.11 11/06/2022    Therapeutic Level Labs: No results found for: LITHIUM No results found for: VALPROATE No results found for: CBMZ  Current Medications: Current Outpatient Medications  Medication Sig Dispense Refill   Acetylcarnitine HCl, Nutrient, (ACETYL L-CARNITINE HCL) 500 MG CAPS Take 500 mg by mouth once.     carbidopa -levodopa  (SINEMET  IR) 10-100 MG tablet 1 tablet three times daily (7am, 11am, 4pm) at least 30 minutes prior to meals. 90  tablet 2   cholecalciferol (VITAMIN D ) 1000 UNITS tablet Take 2,000 Units by mouth daily. Taking 2000 units daily     co-enzyme Q-10 30 MG capsule Take 100 mg by mouth 2 (two) times daily. (Patient taking differently: Take 200 mg by mouth 1 day or 1 dose.)     cyanocobalamin  (VITAMIN B12) 1000 MCG tablet Take 1,000 mcg by mouth daily.     cycloSPORINE (RESTASIS) 0.05 % ophthalmic emulsion 1 drop 2 (two) times daily.     D-RIBOSE PO Take 5 mg by mouth in the morning and at bedtime.     docusate sodium  (COLACE) 100 MG capsule Take 1 capsule (100 mg total) by mouth every 12 (twelve) hours. 60 capsule 0   DULoxetine  (CYMBALTA ) 30 MG capsule TAKE 1 CAPSULE(30 MG) BY MOUTH DAILY 90 capsule 0   gabapentin  (NEURONTIN ) 100 MG capsule Take 1 capsule (100 mg total) by mouth 2 (two) times daily. 60 capsule 0   levocetirizine (XYZAL ) 5 MG tablet TAKE ONE TABLET BY MOUTH EVERY EVENING 90 tablet 1   magnesium  citrate solution Take 296 mLs by mouth once.     NADH POWD 10 mg by Does not apply route once.     olmesartan  (BENICAR ) 20 MG tablet TAKE 1 TABLET(20 MG) BY MOUTH DAILY 90 tablet 0   polyethylene glycol powder (MIRALAX ) 17 GM/SCOOP powder Take 17 g by mouth daily as needed for severe constipation or moderate constipation. 255 g 0   Polyvinyl Alcohol-Povidone (REFRESH OP) Place 1 drop into both eyes 4 (four) times daily as needed (dryness).  pravastatin  (PRAVACHOL ) 10 MG tablet Take 1 tablet (10 mg total) by mouth daily. 90 tablet 3   QUEtiapine  (SEROQUEL ) 25 MG tablet Take 1 tablet (25 mg total) by mouth at bedtime. 90 tablet 3   RABEprazole  (ACIPHEX ) 20 MG tablet TAKE ONE TABLET BY MOUTH ONE TIME DAILY 90 tablet 1   traZODone  (DESYREL ) 50 MG tablet Take 1 tablet (50 mg total) by mouth at bedtime. 90 tablet 3   Turmeric (CURCUMIN 95) 500 MG CAPS Take 1,000 mg by mouth once.     No current facility-administered medications for this visit.   Psychiatric Specialty Exam: Physical Exam  Review of  Systems  Neurological:  Positive for tremors.    Blood pressure 122/79, pulse 73, height 6' (1.829 m), weight 199 lb (90.3 kg).There is no height or weight on file to calculate BMI.  General Appearance: Casual  Eye Contact:  Fair  Speech:  Slow  Volume:  Normal  Mood:  Euthymic  Affect:  Appropriate  Thought Process:  Goal Directed  Orientation:  Full (Time, Place, and Person)  Thought Content:  WDL  Suicidal Thoughts:  No  Homicidal Thoughts:  No  Memory:  Immediate;   Good Recent;   Fair Remote;   Fair  Judgement:  Fair  Insight:  Shallow  Psychomotor Activity:  Decreased  Concentration:  Concentration: Fair and Attention Span: Fair  Recall:  [employer] of Knowledge:  Good  Language:  Good  Akathisia:  No  Handed:  Right  AIMS (if indicated):     Assets:  Communication Skills Desire for Improvement Housing Social Support Transportation  ADL's:  Intact  Cognition:  WNL  Sleep:   ok with CPAP        Screenings: GAD-7    Flowsheet Row Office Visit from 02/06/2024 in [facility] [place] [facility] [employer] Visit from 06/27/2023 in [facility] [place] Office Visit from [facility]/21/20[facility]  Total GAD-7 Score 2 1 1    PHQ2-9    Flowsheet Row Office Visit from 02/06/2024 in [facility] [employer] Visit from 09/30/2023 in [facility] [place] Office Visit from 06/27/2023 in [facility] [place] Office Visit from 03/21/2023 [facility] Clinical Support from 01/07/2023 in [facility] [place] [facility]  PHQ-2 Total Score 0 0 0 0 0  PHQ-9 Total Score 3 0 3 4 5    Flowsheet Row UC from 11/08/2023 in [facility] Office Visit from 03/21/2023 in [facility] ED from  05/02/2022 in [facility]  C-SSRS RISK CATEGORY No Risk No Risk No Risk     Assessment and Plan:  Patient is 74 year old Caucasian man with anxiety, chronic fatigue, apnea, chronic kidney disease and recently given the diagnosis of Parkinson.  Now taking carbidopa /levodopa .  Reviewed collateral information and notes from other provider including current medication.  He is doing much better his fatigue is better and his anxiety is better.  He is more active and went to visit his sister in [place].  Discussed keep the current medication since it is working well.  Will consider lowering the Cymbalta  in the future if needed.  I recommend to stop Seroquel  since sleep is better and he is already taking trazodone .  Discussed antipsychotic medication may cause worsening of Parkinson symptoms.  Patient will try.  Will keep the Cymbalta  30 mg daily and gabapentin  100 mg 2 times a day.  Recommend to call back if is any question or any concern.  Follow-up in 3 months.  Collaboration of Care: Collaboration of Care: Other provider involved in patient's care AEB I will forward my note to primary care.  Patient/Guardian was advised Release of Information must be obtained prior to any record release in order to collaborate their care with an outside provider. Patient/Guardian was advised if they have not already done so to contact the registration department to sign all necessary forms in order for us  to release information regarding their care.   Consent: Patient/Guardian gives verbal consent for treatment and assignment of benefits for services provided during this visit. Patient/Guardian expressed understanding and agreed to proceed.    Leni ONEIDA Client, MD 07/09/2024, 3:16 PM

## 2024-07-16 ENCOUNTER — Other Ambulatory Visit: Payer: Self-pay | Admitting: Urology

## 2024-07-21 ENCOUNTER — Other Ambulatory Visit: Payer: Self-pay | Admitting: Urology

## 2024-08-17 ENCOUNTER — Ambulatory Visit

## 2024-08-31 ENCOUNTER — Ambulatory Visit (HOSPITAL_COMMUNITY): Admit: 2024-08-31 | Admitting: Urology

## 2024-08-31 SURGERY — CIRCUMCISION, ADULT
Anesthesia: General

## 2024-09-10 ENCOUNTER — Ambulatory Visit: Admitting: Neurology

## 2024-10-08 ENCOUNTER — Ambulatory Visit (HOSPITAL_COMMUNITY): Admitting: Psychiatry

## 2024-10-15 ENCOUNTER — Ambulatory Visit (HOSPITAL_COMMUNITY): Admitting: Psychiatry

## 2024-12-03 ENCOUNTER — Ambulatory Visit: Admitting: Neurology
